# Patient Record
Sex: Female | Born: 1950 | Race: White | Hispanic: No | Smoking: Former smoker
Health system: Southern US, Community
[De-identification: ages and names within clinical notes are randomized; demographics above are authoritative.]

## PROBLEM LIST (undated history)

## (undated) DIAGNOSIS — J449 Chronic obstructive pulmonary disease, unspecified: Secondary | ICD-10-CM

## (undated) DIAGNOSIS — R0600 Dyspnea, unspecified: Secondary | ICD-10-CM

## (undated) DIAGNOSIS — J189 Pneumonia, unspecified organism: Secondary | ICD-10-CM

## (undated) DIAGNOSIS — E785 Hyperlipidemia, unspecified: Secondary | ICD-10-CM

## (undated) DIAGNOSIS — Z9889 Other specified postprocedural states: Secondary | ICD-10-CM

## (undated) DIAGNOSIS — M1990 Unspecified osteoarthritis, unspecified site: Secondary | ICD-10-CM

## (undated) DIAGNOSIS — R112 Nausea with vomiting, unspecified: Secondary | ICD-10-CM

## (undated) DIAGNOSIS — C342 Malignant neoplasm of middle lobe, bronchus or lung: Secondary | ICD-10-CM

## (undated) DIAGNOSIS — K449 Diaphragmatic hernia without obstruction or gangrene: Secondary | ICD-10-CM

## (undated) DIAGNOSIS — Z8489 Family history of other specified conditions: Secondary | ICD-10-CM

## (undated) DIAGNOSIS — K219 Gastro-esophageal reflux disease without esophagitis: Secondary | ICD-10-CM

## (undated) HISTORY — DX: Diaphragmatic hernia without obstruction or gangrene: K44.9

## (undated) HISTORY — DX: Hyperlipidemia, unspecified: E78.5

## (undated) MED FILL — Durvalumab Soln for IV Infusion 500 MG/10ML (50 MG/ML): INTRAVENOUS | Qty: 30 | Status: AC

---

## 2007-05-15 HISTORY — PX: ESOPHAGOGASTRODUODENOSCOPY: SHX1529

## 2013-10-30 DIAGNOSIS — W57XXXA Bitten or stung by nonvenomous insect and other nonvenomous arthropods, initial encounter: Secondary | ICD-10-CM | POA: Diagnosis not present

## 2013-10-30 DIAGNOSIS — N3000 Acute cystitis without hematuria: Secondary | ICD-10-CM | POA: Diagnosis not present

## 2013-10-30 DIAGNOSIS — R5383 Other fatigue: Secondary | ICD-10-CM | POA: Diagnosis not present

## 2013-10-30 DIAGNOSIS — R5381 Other malaise: Secondary | ICD-10-CM | POA: Diagnosis not present

## 2013-10-30 DIAGNOSIS — T148 Other injury of unspecified body region: Secondary | ICD-10-CM | POA: Diagnosis not present

## 2013-10-30 DIAGNOSIS — R634 Abnormal weight loss: Secondary | ICD-10-CM | POA: Diagnosis not present

## 2013-10-30 DIAGNOSIS — J069 Acute upper respiratory infection, unspecified: Secondary | ICD-10-CM | POA: Diagnosis not present

## 2013-11-12 DIAGNOSIS — R634 Abnormal weight loss: Secondary | ICD-10-CM | POA: Diagnosis not present

## 2014-01-22 DIAGNOSIS — Z23 Encounter for immunization: Secondary | ICD-10-CM | POA: Diagnosis not present

## 2014-02-19 DIAGNOSIS — J069 Acute upper respiratory infection, unspecified: Secondary | ICD-10-CM | POA: Diagnosis not present

## 2014-04-16 DIAGNOSIS — J069 Acute upper respiratory infection, unspecified: Secondary | ICD-10-CM | POA: Diagnosis not present

## 2014-06-17 DIAGNOSIS — J069 Acute upper respiratory infection, unspecified: Secondary | ICD-10-CM | POA: Diagnosis not present

## 2014-10-15 DIAGNOSIS — Z1231 Encounter for screening mammogram for malignant neoplasm of breast: Secondary | ICD-10-CM | POA: Diagnosis not present

## 2014-11-09 DIAGNOSIS — J069 Acute upper respiratory infection, unspecified: Secondary | ICD-10-CM | POA: Diagnosis not present

## 2015-04-20 DIAGNOSIS — R232 Flushing: Secondary | ICD-10-CM | POA: Diagnosis not present

## 2015-04-20 DIAGNOSIS — R5383 Other fatigue: Secondary | ICD-10-CM | POA: Diagnosis not present

## 2015-04-20 DIAGNOSIS — M25512 Pain in left shoulder: Secondary | ICD-10-CM | POA: Diagnosis not present

## 2015-04-20 DIAGNOSIS — R233 Spontaneous ecchymoses: Secondary | ICD-10-CM | POA: Diagnosis not present

## 2015-10-28 DIAGNOSIS — Z1231 Encounter for screening mammogram for malignant neoplasm of breast: Secondary | ICD-10-CM | POA: Diagnosis not present

## 2015-11-25 DIAGNOSIS — R928 Other abnormal and inconclusive findings on diagnostic imaging of breast: Secondary | ICD-10-CM | POA: Diagnosis not present

## 2015-11-25 DIAGNOSIS — R921 Mammographic calcification found on diagnostic imaging of breast: Secondary | ICD-10-CM | POA: Diagnosis not present

## 2016-01-10 DIAGNOSIS — J208 Acute bronchitis due to other specified organisms: Secondary | ICD-10-CM | POA: Diagnosis not present

## 2016-01-10 DIAGNOSIS — J209 Acute bronchitis, unspecified: Secondary | ICD-10-CM | POA: Diagnosis not present

## 2016-04-13 DIAGNOSIS — J060 Acute laryngopharyngitis: Secondary | ICD-10-CM | POA: Diagnosis not present

## 2016-04-13 DIAGNOSIS — J069 Acute upper respiratory infection, unspecified: Secondary | ICD-10-CM | POA: Diagnosis not present

## 2016-04-13 DIAGNOSIS — J180 Bronchopneumonia, unspecified organism: Secondary | ICD-10-CM | POA: Diagnosis not present

## 2016-06-01 DIAGNOSIS — R5383 Other fatigue: Secondary | ICD-10-CM | POA: Diagnosis not present

## 2016-06-01 DIAGNOSIS — E559 Vitamin D deficiency, unspecified: Secondary | ICD-10-CM | POA: Diagnosis not present

## 2016-06-01 DIAGNOSIS — R922 Inconclusive mammogram: Secondary | ICD-10-CM | POA: Diagnosis not present

## 2016-06-01 DIAGNOSIS — E782 Mixed hyperlipidemia: Secondary | ICD-10-CM | POA: Diagnosis not present

## 2016-06-01 DIAGNOSIS — K219 Gastro-esophageal reflux disease without esophagitis: Secondary | ICD-10-CM | POA: Diagnosis not present

## 2016-09-07 DIAGNOSIS — K219 Gastro-esophageal reflux disease without esophagitis: Secondary | ICD-10-CM | POA: Diagnosis not present

## 2016-09-07 DIAGNOSIS — E559 Vitamin D deficiency, unspecified: Secondary | ICD-10-CM | POA: Diagnosis not present

## 2016-09-07 DIAGNOSIS — R5383 Other fatigue: Secondary | ICD-10-CM | POA: Diagnosis not present

## 2016-09-07 DIAGNOSIS — I481 Persistent atrial fibrillation: Secondary | ICD-10-CM | POA: Diagnosis not present

## 2016-09-07 DIAGNOSIS — I5021 Acute systolic (congestive) heart failure: Secondary | ICD-10-CM | POA: Diagnosis not present

## 2016-09-07 DIAGNOSIS — R922 Inconclusive mammogram: Secondary | ICD-10-CM | POA: Diagnosis not present

## 2016-09-07 DIAGNOSIS — D519 Vitamin B12 deficiency anemia, unspecified: Secondary | ICD-10-CM | POA: Diagnosis not present

## 2016-09-07 DIAGNOSIS — E038 Other specified hypothyroidism: Secondary | ICD-10-CM | POA: Diagnosis not present

## 2016-09-07 DIAGNOSIS — E782 Mixed hyperlipidemia: Secondary | ICD-10-CM | POA: Diagnosis not present

## 2016-09-07 DIAGNOSIS — D529 Folate deficiency anemia, unspecified: Secondary | ICD-10-CM | POA: Diagnosis not present

## 2016-09-09 DIAGNOSIS — T63441A Toxic effect of venom of bees, accidental (unintentional), initial encounter: Secondary | ICD-10-CM | POA: Diagnosis not present

## 2016-09-11 ENCOUNTER — Other Ambulatory Visit: Payer: Self-pay

## 2016-09-12 DIAGNOSIS — H9201 Otalgia, right ear: Secondary | ICD-10-CM | POA: Diagnosis not present

## 2016-09-13 DIAGNOSIS — R921 Mammographic calcification found on diagnostic imaging of breast: Secondary | ICD-10-CM | POA: Diagnosis not present

## 2016-09-13 DIAGNOSIS — R928 Other abnormal and inconclusive findings on diagnostic imaging of breast: Secondary | ICD-10-CM | POA: Diagnosis not present

## 2016-09-28 DIAGNOSIS — J208 Acute bronchitis due to other specified organisms: Secondary | ICD-10-CM | POA: Diagnosis not present

## 2016-11-02 DIAGNOSIS — Z800 Family history of malignant neoplasm of digestive organs: Secondary | ICD-10-CM | POA: Diagnosis not present

## 2016-11-02 DIAGNOSIS — Z86010 Personal history of colonic polyps: Secondary | ICD-10-CM | POA: Diagnosis not present

## 2016-11-02 DIAGNOSIS — F17200 Nicotine dependence, unspecified, uncomplicated: Secondary | ICD-10-CM | POA: Diagnosis not present

## 2016-11-02 DIAGNOSIS — D509 Iron deficiency anemia, unspecified: Secondary | ICD-10-CM | POA: Diagnosis not present

## 2016-11-28 DIAGNOSIS — M545 Low back pain: Secondary | ICD-10-CM | POA: Diagnosis not present

## 2016-11-28 DIAGNOSIS — Z23 Encounter for immunization: Secondary | ICD-10-CM | POA: Diagnosis not present

## 2016-12-15 DIAGNOSIS — M5432 Sciatica, left side: Secondary | ICD-10-CM | POA: Diagnosis not present

## 2016-12-27 DIAGNOSIS — M810 Age-related osteoporosis without current pathological fracture: Secondary | ICD-10-CM | POA: Diagnosis not present

## 2016-12-27 DIAGNOSIS — M5432 Sciatica, left side: Secondary | ICD-10-CM | POA: Diagnosis not present

## 2016-12-27 DIAGNOSIS — M5136 Other intervertebral disc degeneration, lumbar region: Secondary | ICD-10-CM | POA: Diagnosis not present

## 2016-12-27 DIAGNOSIS — M25552 Pain in left hip: Secondary | ICD-10-CM | POA: Diagnosis not present

## 2017-01-18 DIAGNOSIS — D509 Iron deficiency anemia, unspecified: Secondary | ICD-10-CM | POA: Diagnosis not present

## 2017-01-18 DIAGNOSIS — E785 Hyperlipidemia, unspecified: Secondary | ICD-10-CM | POA: Diagnosis not present

## 2017-01-18 DIAGNOSIS — Z1211 Encounter for screening for malignant neoplasm of colon: Secondary | ICD-10-CM | POA: Diagnosis not present

## 2017-01-18 DIAGNOSIS — J449 Chronic obstructive pulmonary disease, unspecified: Secondary | ICD-10-CM | POA: Diagnosis not present

## 2017-01-18 DIAGNOSIS — Z87891 Personal history of nicotine dependence: Secondary | ICD-10-CM | POA: Diagnosis not present

## 2017-01-18 DIAGNOSIS — Z79899 Other long term (current) drug therapy: Secondary | ICD-10-CM | POA: Diagnosis not present

## 2017-01-18 DIAGNOSIS — K219 Gastro-esophageal reflux disease without esophagitis: Secondary | ICD-10-CM | POA: Diagnosis not present

## 2017-01-18 DIAGNOSIS — F419 Anxiety disorder, unspecified: Secondary | ICD-10-CM | POA: Diagnosis not present

## 2017-01-18 DIAGNOSIS — K5730 Diverticulosis of large intestine without perforation or abscess without bleeding: Secondary | ICD-10-CM | POA: Diagnosis not present

## 2017-01-18 DIAGNOSIS — D122 Benign neoplasm of ascending colon: Secondary | ICD-10-CM | POA: Diagnosis not present

## 2017-01-18 DIAGNOSIS — K449 Diaphragmatic hernia without obstruction or gangrene: Secondary | ICD-10-CM | POA: Diagnosis not present

## 2017-01-18 DIAGNOSIS — K648 Other hemorrhoids: Secondary | ICD-10-CM | POA: Diagnosis not present

## 2017-01-18 DIAGNOSIS — Z800 Family history of malignant neoplasm of digestive organs: Secondary | ICD-10-CM | POA: Diagnosis not present

## 2017-01-18 DIAGNOSIS — Z86010 Personal history of colonic polyps: Secondary | ICD-10-CM | POA: Diagnosis not present

## 2017-01-18 HISTORY — PX: COLONOSCOPY: SHX174

## 2017-01-18 LAB — HM COLONOSCOPY

## 2017-02-19 DIAGNOSIS — L03211 Cellulitis of face: Secondary | ICD-10-CM | POA: Diagnosis not present

## 2017-05-09 DIAGNOSIS — S61412A Laceration without foreign body of left hand, initial encounter: Secondary | ICD-10-CM | POA: Diagnosis not present

## 2017-05-09 DIAGNOSIS — J060 Acute laryngopharyngitis: Secondary | ICD-10-CM | POA: Diagnosis not present

## 2017-05-25 DIAGNOSIS — M25572 Pain in left ankle and joints of left foot: Secondary | ICD-10-CM | POA: Diagnosis not present

## 2017-05-25 DIAGNOSIS — S41112A Laceration without foreign body of left upper arm, initial encounter: Secondary | ICD-10-CM | POA: Diagnosis not present

## 2017-10-24 DIAGNOSIS — R921 Mammographic calcification found on diagnostic imaging of breast: Secondary | ICD-10-CM | POA: Diagnosis not present

## 2017-12-06 DIAGNOSIS — Z23 Encounter for immunization: Secondary | ICD-10-CM | POA: Diagnosis not present

## 2018-02-13 DIAGNOSIS — H4301 Vitreous prolapse, right eye: Secondary | ICD-10-CM | POA: Diagnosis not present

## 2018-02-18 DIAGNOSIS — Z23 Encounter for immunization: Secondary | ICD-10-CM | POA: Diagnosis not present

## 2018-02-18 DIAGNOSIS — E782 Mixed hyperlipidemia: Secondary | ICD-10-CM | POA: Diagnosis not present

## 2018-02-18 DIAGNOSIS — E559 Vitamin D deficiency, unspecified: Secondary | ICD-10-CM | POA: Diagnosis not present

## 2018-02-18 DIAGNOSIS — R634 Abnormal weight loss: Secondary | ICD-10-CM | POA: Diagnosis not present

## 2018-02-18 DIAGNOSIS — R5383 Other fatigue: Secondary | ICD-10-CM | POA: Diagnosis not present

## 2018-02-18 DIAGNOSIS — Z0001 Encounter for general adult medical examination with abnormal findings: Secondary | ICD-10-CM | POA: Diagnosis not present

## 2018-02-18 DIAGNOSIS — N3001 Acute cystitis with hematuria: Secondary | ICD-10-CM | POA: Diagnosis not present

## 2018-02-18 DIAGNOSIS — Z6821 Body mass index (BMI) 21.0-21.9, adult: Secondary | ICD-10-CM | POA: Diagnosis not present

## 2018-02-19 DIAGNOSIS — R000 Tachycardia, unspecified: Secondary | ICD-10-CM | POA: Diagnosis present

## 2018-02-19 DIAGNOSIS — J9601 Acute respiratory failure with hypoxia: Secondary | ICD-10-CM | POA: Diagnosis not present

## 2018-02-19 DIAGNOSIS — R509 Fever, unspecified: Secondary | ICD-10-CM | POA: Diagnosis not present

## 2018-02-19 DIAGNOSIS — R0902 Hypoxemia: Secondary | ICD-10-CM | POA: Diagnosis not present

## 2018-02-19 DIAGNOSIS — R590 Localized enlarged lymph nodes: Secondary | ICD-10-CM | POA: Diagnosis present

## 2018-02-19 DIAGNOSIS — J189 Pneumonia, unspecified organism: Secondary | ICD-10-CM | POA: Diagnosis present

## 2018-02-19 DIAGNOSIS — Z79899 Other long term (current) drug therapy: Secondary | ICD-10-CM | POA: Diagnosis not present

## 2018-02-19 DIAGNOSIS — R0602 Shortness of breath: Secondary | ICD-10-CM | POA: Diagnosis not present

## 2018-02-19 DIAGNOSIS — F17210 Nicotine dependence, cigarettes, uncomplicated: Secondary | ICD-10-CM | POA: Diagnosis present

## 2018-02-19 DIAGNOSIS — R0600 Dyspnea, unspecified: Secondary | ICD-10-CM | POA: Diagnosis not present

## 2018-02-19 DIAGNOSIS — D72829 Elevated white blood cell count, unspecified: Secondary | ICD-10-CM | POA: Diagnosis not present

## 2018-02-19 DIAGNOSIS — K219 Gastro-esophageal reflux disease without esophagitis: Secondary | ICD-10-CM | POA: Diagnosis present

## 2018-02-19 DIAGNOSIS — Z885 Allergy status to narcotic agent status: Secondary | ICD-10-CM | POA: Diagnosis not present

## 2018-02-19 DIAGNOSIS — E873 Alkalosis: Secondary | ICD-10-CM | POA: Diagnosis present

## 2018-02-19 DIAGNOSIS — E871 Hypo-osmolality and hyponatremia: Secondary | ICD-10-CM | POA: Diagnosis present

## 2018-02-19 DIAGNOSIS — R871 Abnormal level of hormones in specimens from female genital organs: Secondary | ICD-10-CM | POA: Diagnosis not present

## 2018-02-20 DIAGNOSIS — J189 Pneumonia, unspecified organism: Secondary | ICD-10-CM

## 2018-02-25 DIAGNOSIS — J168 Pneumonia due to other specified infectious organisms: Secondary | ICD-10-CM | POA: Diagnosis not present

## 2018-02-25 DIAGNOSIS — R799 Abnormal finding of blood chemistry, unspecified: Secondary | ICD-10-CM | POA: Diagnosis not present

## 2018-03-16 DIAGNOSIS — J111 Influenza due to unidentified influenza virus with other respiratory manifestations: Secondary | ICD-10-CM | POA: Diagnosis not present

## 2018-03-20 DIAGNOSIS — J09X2 Influenza due to identified novel influenza A virus with other respiratory manifestations: Secondary | ICD-10-CM | POA: Diagnosis not present

## 2018-03-27 DIAGNOSIS — N3001 Acute cystitis with hematuria: Secondary | ICD-10-CM | POA: Diagnosis not present

## 2018-06-26 DIAGNOSIS — E559 Vitamin D deficiency, unspecified: Secondary | ICD-10-CM | POA: Diagnosis not present

## 2018-06-26 DIAGNOSIS — R918 Other nonspecific abnormal finding of lung field: Secondary | ICD-10-CM | POA: Diagnosis not present

## 2018-06-26 DIAGNOSIS — K219 Gastro-esophageal reflux disease without esophagitis: Secondary | ICD-10-CM | POA: Diagnosis not present

## 2018-07-02 DIAGNOSIS — Z20828 Contact with and (suspected) exposure to other viral communicable diseases: Secondary | ICD-10-CM | POA: Diagnosis not present

## 2018-07-02 DIAGNOSIS — E782 Mixed hyperlipidemia: Secondary | ICD-10-CM | POA: Diagnosis not present

## 2018-07-02 DIAGNOSIS — R5383 Other fatigue: Secondary | ICD-10-CM | POA: Diagnosis not present

## 2018-07-02 DIAGNOSIS — E559 Vitamin D deficiency, unspecified: Secondary | ICD-10-CM | POA: Diagnosis not present

## 2018-09-10 DIAGNOSIS — R918 Other nonspecific abnormal finding of lung field: Secondary | ICD-10-CM | POA: Diagnosis not present

## 2018-09-10 DIAGNOSIS — R911 Solitary pulmonary nodule: Secondary | ICD-10-CM | POA: Diagnosis not present

## 2018-10-03 DIAGNOSIS — W19XXXA Unspecified fall, initial encounter: Secondary | ICD-10-CM | POA: Diagnosis not present

## 2018-10-03 DIAGNOSIS — R52 Pain, unspecified: Secondary | ICD-10-CM | POA: Diagnosis not present

## 2018-10-03 DIAGNOSIS — F17210 Nicotine dependence, cigarettes, uncomplicated: Secondary | ICD-10-CM | POA: Diagnosis not present

## 2018-10-03 DIAGNOSIS — S80811A Abrasion, right lower leg, initial encounter: Secondary | ICD-10-CM | POA: Diagnosis not present

## 2018-10-03 DIAGNOSIS — S80212A Abrasion, left knee, initial encounter: Secondary | ICD-10-CM | POA: Diagnosis not present

## 2018-10-03 DIAGNOSIS — S60512A Abrasion of left hand, initial encounter: Secondary | ICD-10-CM | POA: Diagnosis not present

## 2018-10-08 DIAGNOSIS — E782 Mixed hyperlipidemia: Secondary | ICD-10-CM | POA: Diagnosis not present

## 2018-10-08 DIAGNOSIS — S80811D Abrasion, right lower leg, subsequent encounter: Secondary | ICD-10-CM | POA: Diagnosis not present

## 2018-10-08 DIAGNOSIS — K219 Gastro-esophageal reflux disease without esophagitis: Secondary | ICD-10-CM | POA: Diagnosis not present

## 2018-10-08 DIAGNOSIS — E559 Vitamin D deficiency, unspecified: Secondary | ICD-10-CM | POA: Diagnosis not present

## 2018-10-11 DIAGNOSIS — E559 Vitamin D deficiency, unspecified: Secondary | ICD-10-CM | POA: Diagnosis not present

## 2018-10-11 DIAGNOSIS — R5383 Other fatigue: Secondary | ICD-10-CM | POA: Diagnosis not present

## 2018-10-11 DIAGNOSIS — E782 Mixed hyperlipidemia: Secondary | ICD-10-CM | POA: Diagnosis not present

## 2018-10-15 DIAGNOSIS — S80811D Abrasion, right lower leg, subsequent encounter: Secondary | ICD-10-CM | POA: Diagnosis not present

## 2018-11-13 DIAGNOSIS — H25013 Cortical age-related cataract, bilateral: Secondary | ICD-10-CM | POA: Diagnosis not present

## 2018-11-13 DIAGNOSIS — H5203 Hypermetropia, bilateral: Secondary | ICD-10-CM | POA: Diagnosis not present

## 2018-12-12 DIAGNOSIS — Z23 Encounter for immunization: Secondary | ICD-10-CM | POA: Diagnosis not present

## 2019-01-10 ENCOUNTER — Other Ambulatory Visit: Payer: Self-pay

## 2019-01-20 DIAGNOSIS — Z1231 Encounter for screening mammogram for malignant neoplasm of breast: Secondary | ICD-10-CM | POA: Diagnosis not present

## 2019-01-20 LAB — HM MAMMOGRAPHY

## 2019-05-14 ENCOUNTER — Other Ambulatory Visit: Payer: Self-pay

## 2019-05-14 MED ORDER — OMEPRAZOLE 20 MG PO CPDR: 20.0000 mg | DELAYED_RELEASE_CAPSULE | Freq: Once | ORAL | 0 refills | Status: DC

## 2019-05-30 ENCOUNTER — Other Ambulatory Visit: Payer: Self-pay | Admitting: Physician Assistant

## 2019-05-30 MED ORDER — OMEPRAZOLE 20 MG PO CPDR: 20.0000 mg | DELAYED_RELEASE_CAPSULE | Freq: Two times a day (BID) | ORAL | 3 refills | Status: DC

## 2019-06-02 ENCOUNTER — Other Ambulatory Visit: Payer: Self-pay | Admitting: Physician Assistant

## 2019-06-02 MED ORDER — OMEPRAZOLE 20 MG PO CPDR: 20.0000 mg | DELAYED_RELEASE_CAPSULE | Freq: Every day | ORAL | 3 refills | Status: DC

## 2019-09-26 DIAGNOSIS — S61412A Laceration without foreign body of left hand, initial encounter: Secondary | ICD-10-CM | POA: Diagnosis not present

## 2019-09-26 DIAGNOSIS — W19XXXA Unspecified fall, initial encounter: Secondary | ICD-10-CM | POA: Diagnosis not present

## 2019-09-26 DIAGNOSIS — M25552 Pain in left hip: Secondary | ICD-10-CM | POA: Diagnosis not present

## 2019-09-26 DIAGNOSIS — S61411A Laceration without foreign body of right hand, initial encounter: Secondary | ICD-10-CM | POA: Diagnosis not present

## 2019-11-04 DIAGNOSIS — Z20828 Contact with and (suspected) exposure to other viral communicable diseases: Secondary | ICD-10-CM | POA: Diagnosis not present

## 2019-12-02 ENCOUNTER — Ambulatory Visit (INDEPENDENT_AMBULATORY_CARE_PROVIDER_SITE_OTHER): Payer: Medicare Other

## 2019-12-02 DIAGNOSIS — Z23 Encounter for immunization: Secondary | ICD-10-CM

## 2019-12-05 ENCOUNTER — Telehealth: Payer: Self-pay

## 2019-12-05 NOTE — Telephone Encounter (Signed)
Pt is over due for an appointment. I spoke with the pt, she will call back to schedule an apt

## 2019-12-08 DIAGNOSIS — N814 Uterovaginal prolapse, unspecified: Secondary | ICD-10-CM | POA: Diagnosis not present

## 2019-12-23 DIAGNOSIS — Z466 Encounter for fitting and adjustment of urinary device: Secondary | ICD-10-CM | POA: Diagnosis not present

## 2019-12-23 DIAGNOSIS — N814 Uterovaginal prolapse, unspecified: Secondary | ICD-10-CM | POA: Diagnosis not present

## 2020-01-10 DIAGNOSIS — J324 Chronic pansinusitis: Secondary | ICD-10-CM | POA: Diagnosis not present

## 2020-01-12 ENCOUNTER — Encounter: Payer: Self-pay | Admitting: Family Medicine

## 2020-01-12 ENCOUNTER — Ambulatory Visit (INDEPENDENT_AMBULATORY_CARE_PROVIDER_SITE_OTHER): Payer: Medicare Other | Admitting: Family Medicine

## 2020-01-12 VITALS — BP 124/80 | HR 95 | Temp 97.5°F | Resp 18 | Wt 152.0 lb

## 2020-01-12 DIAGNOSIS — R059 Cough, unspecified: Secondary | ICD-10-CM

## 2020-01-12 HISTORY — DX: Cough, unspecified: R05.9

## 2020-01-12 MED ORDER — ALBUTEROL SULFATE HFA 108 (90 BASE) MCG/ACT IN AERS: 2.0000 | INHALATION_SPRAY | Freq: Four times a day (QID) | RESPIRATORY_TRACT | 0 refills | Status: DC | PRN

## 2020-01-12 NOTE — Patient Instructions (Signed)
Concern for cough/bronchospasm pts oxygen level low at urgent care and home Chest xray

## 2020-01-12 NOTE — Progress Notes (Signed)
Acute Office Visit  Subjective:    Patient ID: [name], female    DOB: [birth year], 69 y.o.   MRN: [id number]  Chief Complaint  Patient presents with  . Cough  . Nasal Congestion    HPI Patient is in today for continue cough-taking zithromax/promethazineDM Seen in UC-01/10/20-flu negative- Family members with COVID in August/Sept-no recent exposure Oxygen level low at home 90%-usually above 95% No tob use-quit 2019-smoked 50year-1/2 /day, no work site exposure. [place]    Coughing, runny nose (yellow mucus), nasal congestion, sore throat started Friday evening while at work. Went to UC and was given Zpack and Promethazine DM. Was also told to take sudafed but did not use. Grandchildren are also sick.   Great grandchildren with sore throat-congestion-fever   Social History   Socioeconomic History  . Marital status: Widowed    Spouse name: Not on file  . Number of children: Not on file  . Years of education: Not on file  . Highest education level: Not on file  Occupational History  . Not on file  Tobacco Use  . Smoking status: Former Smoker    Types: Cigarettes  . Smokeless tobacco: Never Used  Substance and Sexual Activity  . Alcohol use: Not Currently  . Drug use: Never  . Sexual activity: Not on file  Other Topics Concern  . Not on file  Social History Narrative  . Not on file   Social Determinants of Health   Financial Resource Strain:   . Difficulty of Paying Living Expenses: Not on file  Food Insecurity:   . Worried About Charity fundraiser in the Last Year: Not on file  . Ran Out of Food in the Last Year: Not on file  Transportation Needs:   . Lack of Transportation (Medical): Not on file  . Lack of Transportation (Non-Medical): Not on file  Physical Activity:   . Days of Exercise per Week: Not on file  . Minutes of Exercise per Session: Not on file  Stress:   . Feeling of Stress : Not on file  Social Connections:   . Frequency of  Communication with Friends and Family: Not on file  . Frequency of Social Gatherings with Friends and Family: Not on file  . Attends Religious Services: Not on file  . Active Member of Clubs or Organizations: Not on file  . Attends Archivist Meetings: Not on file  . Marital Status: Not on file  Intimate Partner Violence:   . Fear of Current or Ex-Partner: Not on file  . Emotionally Abused: Not on file  . Physically Abused: Not on file  . Sexually Abused: Not on file    Outpatient Medications Prior to Visit  Medication Sig Dispense Refill  . omeprazole (PRILOSEC) 20 MG capsule Take 1 capsule (20 mg total) by mouth daily. 90 capsule 3   No facility-administered medications prior to visit.    Allergies  Allergen Reactions  . Codeine Hives  . Surgical Lubricant Hives    Review of Systems  Constitutional: Negative for fever.  HENT: Positive for congestion, postnasal drip, sinus pain and sore throat.   Respiratory: Positive for cough. Negative for wheezing.        Low oxygen sat Used MDI in the past when sick  Cardiovascular: Negative for chest pain.  Gastrointestinal: Negative for abdominal pain.  Neurological: Negative for dizziness. Facial asymmetry: .cough.       Objective:    Physical Exam  Constitutional:      Appearance: Normal appearance.  HENT:     Head: Normocephalic and atraumatic.  Cardiovascular:     Rate and Rhythm: Normal rate and regular rhythm.     Pulses: Normal pulses.     Heart sounds: Normal heart sounds.  Pulmonary:     Breath sounds: Examination of the right-lower field reveals decreased breath sounds. Examination of the left-lower field reveals decreased breath sounds. Decreased breath sounds present. No wheezing or rhonchi.  Neurological:     Mental Status: She is alert and oriented to person, place, and time.  Psychiatric:        Mood and Affect: Mood normal.        Behavior: Behavior normal.     BP 124/80   Pulse 95   Temp  (!) 97.5 F (36.4 C)   Resp 18   Wt 152 lb (68.9 kg)   SpO2 96%  Wt Readings from Last 3 Encounters:  01/12/20 152 lb (68.9 kg)    Health Maintenance Due  Topic Date Due  . Hepatitis C Screening  Never done  . COVID-19 Vaccine (1) Never done  . TETANUS/TDAP  Never done  . MAMMOGRAM  Never done  . COLONOSCOPY  Never done  . DEXA SCAN  Never done  . PNA vac Low Risk Adult (1 of 2 - PCV13) Never done  No results found for: CHOLHDL No results found for: HGBA1C     Assessment & Plan:   1. Cough-likely bronchospasm-previous diagnosis COPD-exacerbation Albuterol-rx, CXR-d/w pt concern for pneumonia with low sat-no use of inhalers on a regular basis.  Complete zpack Follow-up:  cxr An After Visit Summary was printed and given to the patient.  Laguana Desautel Hannah Beat, MD [facility] [phone]

## 2020-01-13 ENCOUNTER — Telehealth: Payer: Self-pay

## 2020-01-13 ENCOUNTER — Other Ambulatory Visit: Payer: Self-pay | Admitting: Physician Assistant

## 2020-01-13 DIAGNOSIS — Z1231 Encounter for screening mammogram for malignant neoplasm of breast: Secondary | ICD-10-CM

## 2020-01-13 DIAGNOSIS — R059 Cough, unspecified: Secondary | ICD-10-CM | POA: Diagnosis not present

## 2020-01-13 NOTE — Telephone Encounter (Signed)
Pt called wanted to know if you can order her a mammo, she has a AWV appt 12/21.

## 2020-01-14 ENCOUNTER — Other Ambulatory Visit: Payer: Self-pay

## 2020-01-14 ENCOUNTER — Ambulatory Visit: Payer: Medicare Other | Admitting: Physician Assistant

## 2020-01-14 NOTE — Progress Notes (Signed)
Error

## 2020-01-19 DIAGNOSIS — R9389 Abnormal findings on diagnostic imaging of other specified body structures: Secondary | ICD-10-CM

## 2020-01-19 HISTORY — DX: Abnormal findings on diagnostic imaging of other specified body structures: R93.89

## 2020-01-22 DIAGNOSIS — N898 Other specified noninflammatory disorders of vagina: Secondary | ICD-10-CM | POA: Diagnosis not present

## 2020-01-22 DIAGNOSIS — Z466 Encounter for fitting and adjustment of urinary device: Secondary | ICD-10-CM | POA: Diagnosis not present

## 2020-01-23 DIAGNOSIS — I739 Peripheral vascular disease, unspecified: Secondary | ICD-10-CM | POA: Diagnosis not present

## 2020-01-23 DIAGNOSIS — I708 Atherosclerosis of other arteries: Secondary | ICD-10-CM | POA: Diagnosis not present

## 2020-01-23 DIAGNOSIS — I2510 Atherosclerotic heart disease of native coronary artery without angina pectoris: Secondary | ICD-10-CM | POA: Diagnosis not present

## 2020-01-23 DIAGNOSIS — R918 Other nonspecific abnormal finding of lung field: Secondary | ICD-10-CM | POA: Diagnosis not present

## 2020-01-23 DIAGNOSIS — R9389 Abnormal findings on diagnostic imaging of other specified body structures: Secondary | ICD-10-CM | POA: Diagnosis not present

## 2020-01-26 ENCOUNTER — Other Ambulatory Visit: Payer: Self-pay | Admitting: Physician Assistant

## 2020-01-26 DIAGNOSIS — J984 Other disorders of lung: Secondary | ICD-10-CM

## 2020-01-27 ENCOUNTER — Ambulatory Visit (INDEPENDENT_AMBULATORY_CARE_PROVIDER_SITE_OTHER): Payer: Medicare Other | Admitting: Physician Assistant

## 2020-01-27 ENCOUNTER — Other Ambulatory Visit: Payer: Self-pay

## 2020-01-27 ENCOUNTER — Encounter: Payer: Self-pay | Admitting: Physician Assistant

## 2020-01-27 VITALS — BP 120/82 | HR 85 | Temp 97.2°F | Ht 67.5 in | Wt 154.4 lb

## 2020-01-27 DIAGNOSIS — Z1231 Encounter for screening mammogram for malignant neoplasm of breast: Secondary | ICD-10-CM | POA: Diagnosis not present

## 2020-01-27 DIAGNOSIS — I700 Atherosclerosis of aorta: Secondary | ICD-10-CM

## 2020-01-27 DIAGNOSIS — I771 Stricture of artery: Secondary | ICD-10-CM | POA: Diagnosis not present

## 2020-01-27 DIAGNOSIS — R918 Other nonspecific abnormal finding of lung field: Secondary | ICD-10-CM | POA: Diagnosis not present

## 2020-01-27 MED ORDER — ROSUVASTATIN CALCIUM 5 MG PO TABS: 5.0000 mg | ORAL_TABLET | Freq: Every day | ORAL | 2 refills | Status: DC

## 2020-01-27 NOTE — Progress Notes (Signed)
Subjective:  Patient ID: [name], female    DOB: 12/10/1950  Age: 69 y.o. MRN: [id number]  Chief Complaint  Patient presents with  . 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 - pt has been smoker most of her life - had CT over a year ago and did not come back for repeat CT as advised I have ordered a PET scan and have also ordered for her to see CVTS for further evaluation and treatment Pt voices no problems or concerns and actually states she is feeling well today  CT also showed emphysema - pt has albuterol inhaler but states her cough is gone and does not need or use this inhaler regularly - denies shortness of breath today  Aortic atherosclerosis was noted on CT as well - this was discussed with patient - she is agreeable to restart statin treatment - this had been advised in past but pt had stopped medication  It is noted that 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 albuterol (VENTOLIN HFA) 108 (90 Base) MCG/ACT inhaler Inhale 2 puffs into the lungs every 6 (six) hours as needed for wheezing or shortness of breath. 8 g 0  . omeprazole (PRILOSEC) 20 MG capsule Take 1 capsule (20 mg total) by mouth daily. 90 capsule 3   No current facility-administered medications on file prior to visit.   History reviewed. No pertinent past medical history. History reviewed. No pertinent surgical history.  History reviewed. No pertinent family history. Social History   Socioeconomic History  . Marital status: Widowed    Spouse name: Not on file  . Number of children: Not on file  . Years of education: Not on file  . Highest education level: Not on file  Occupational History  . Not on file  Tobacco Use  . Smoking  status: Former Smoker    Types: Cigarettes  . Smokeless tobacco: Never Used  Substance and Sexual Activity  . Alcohol use: Not Currently  . Drug use: Never  . Sexual activity: Not on file  Other Topics Concern  . Not on file  Social History Narrative  . Not on file   Social Determinants of Health   Financial Resource Strain:   . Difficulty of Paying Living Expenses: Not on file  Food Insecurity:   . Worried About Charity fundraiser in the Last Year: Not on file  . Ran Out of Food in the Last Year: Not on file  Transportation Needs:   . Lack of Transportation (Medical): Not on file  . Lack of Transportation (Non-Medical): Not on file  Physical Activity:   . Days of Exercise per Week: Not on file  . Minutes of Exercise per Session: Not on file  Stress:   . Feeling of Stress : Not on file  Social Connections:   . Frequency of Communication with Friends and Family: Not on file  . Frequency of Social Gatherings with Friends and Family: Not on file  . Attends Religious Services: Not on file  . Active Member of Clubs or Organizations: Not on file  . Attends Archivist Meetings: Not on file  . Marital Status: Not on file    Review of Systems CONSTITUTIONAL: Negative for  chills, fatigue, fever, unintentional weight gain and unintentional weight loss.  E/N/T: Negative for ear pain, nasal congestion and sore throat.  CARDIOVASCULAR: Negative for chest pain, dizziness, palpitations and pedal edema.  RESPIRATORY: Negative for recent cough and dyspnea.   MSK: Negative for arthralgias and myalgias.  INTEGUMENTARY: Negative for rash.   PSYCHIATRIC: Negative for sleep disturbance and to question depression screen.  Negative for depression, negative for anhedonia.       Objective:  BP 120/82 (BP Location: Left Arm, Patient Position: Sitting, Cuff Size: Normal)   Pulse 85   Temp (!) 97.2 F (36.2 C) (Temporal)   Ht 5' 7.5" (1.715 m)   Wt 154 lb 6.4 oz (70 kg)   SpO2  96%   BMI 23.83 kg/m   BP/Weight 01/27/2020 40/97/3532  Systolic BP 992 426  Diastolic BP 82 80  Wt. (Lbs) 154.4 152  BMI 23.83 -    Physical Exam PHYSICAL EXAM:   VS: BP 120/82 (BP Location: Left Arm, Patient Position: Sitting, Cuff Size: Normal)   Pulse 85   Temp (!) 97.2 F (36.2 C) (Temporal)   Ht 5' 7.5" (1.715 m)   Wt 154 lb 6.4 oz (70 kg)   SpO2 96%   BMI 23.83 kg/m   GEN: Well nourished, well developed, in no acute distress  Cardiac: RRR; no murmurs, rubs, or gallops,no edema - no significant varicosities Respiratory:  normal respiratory rate and pattern with no distress - normal breath sounds with no rales, rhonchi, wheezes or rubs GI: normal bowel sounds, no masses or tenderness MS: no deformity or atrophy  Skin: warm and dry, no rash  Neuro:  Alert and Oriented x 3, Strength and sensation are intact - CN II-Xii grossly intact Psych: euthymic mood, appropriate affect and demeanor  Diabetic Foot Exam - Simple   No data filed       No results found for: WBC, HGB, HCT, PLT, GLUCOSE, CHOL, TRIG, HDL, LDLDIRECT, LDLCALC, ALT, AST, NA, K, CL, CREATININE, BUN, CO2, TSH, PSA, INR, GLUF, HGBA1C, MICROALBUR    Assessment & Plan:   1. Right lower lobe lung mass PET scan and referral to CVTS 2. Aortic atherosclerosis (HCC) - rosuvastatin (CRESTOR) 5 MG tablet; Take 1 tablet (5 mg total) by mouth daily.  Dispense: 30 tablet; Refill: 2  3. Visit for screening mammogram - MM Digital Screening  4. Stenosis of left subclavian artery (HCC)  referral to CVTS  Meds ordered this encounter  Medications  . rosuvastatin (CRESTOR) 5 MG tablet    Sig: Take 1 tablet (5 mg total) by mouth daily.    Dispense:  30 tablet    Refill:  2    Order Specific Question:   Supervising Provider    AnswerShelton Silvas    Orders Placed This Encounter  Procedures  . MM Digital Screening        Follow-up: Return in about 4 weeks (around 02/24/2020).  An After Visit  Summary was printed and given to the patient.  Yetta Flock [facility] [phone]

## 2020-01-28 ENCOUNTER — Encounter: Payer: Self-pay | Admitting: Physician Assistant

## 2020-01-28 DIAGNOSIS — I771 Stricture of artery: Secondary | ICD-10-CM

## 2020-01-28 DIAGNOSIS — Z1231 Encounter for screening mammogram for malignant neoplasm of breast: Secondary | ICD-10-CM

## 2020-01-28 DIAGNOSIS — R918 Other nonspecific abnormal finding of lung field: Secondary | ICD-10-CM | POA: Insufficient documentation

## 2020-01-28 DIAGNOSIS — I700 Atherosclerosis of aorta: Secondary | ICD-10-CM

## 2020-01-28 HISTORY — DX: Other nonspecific abnormal finding of lung field: R91.8

## 2020-01-28 HISTORY — DX: Encounter for screening mammogram for malignant neoplasm of breast: Z12.31

## 2020-01-28 HISTORY — DX: Atherosclerosis of aorta: I70.0

## 2020-01-28 HISTORY — DX: Stricture of artery: I77.1

## 2020-01-29 ENCOUNTER — Other Ambulatory Visit: Payer: Self-pay | Admitting: Family Medicine

## 2020-01-29 ENCOUNTER — Ambulatory Visit: Payer: Medicare Other | Admitting: Physician Assistant

## 2020-02-05 ENCOUNTER — Encounter: Payer: Self-pay | Admitting: Cardiothoracic Surgery

## 2020-02-05 ENCOUNTER — Other Ambulatory Visit: Payer: Self-pay | Admitting: *Deleted

## 2020-02-05 ENCOUNTER — Institutional Professional Consult (permissible substitution) (INDEPENDENT_AMBULATORY_CARE_PROVIDER_SITE_OTHER): Payer: Medicare Other | Admitting: Cardiothoracic Surgery

## 2020-02-05 ENCOUNTER — Other Ambulatory Visit: Payer: Self-pay | Admitting: Cardiothoracic Surgery

## 2020-02-05 ENCOUNTER — Other Ambulatory Visit: Payer: Self-pay

## 2020-02-05 VITALS — BP 161/70 | HR 82 | Temp 98.1°F | Resp 20 | Ht 67.5 in | Wt 151.8 lb

## 2020-02-05 DIAGNOSIS — R918 Other nonspecific abnormal finding of lung field: Secondary | ICD-10-CM | POA: Diagnosis not present

## 2020-02-05 NOTE — Progress Notes (Signed)
The proposed treatment discussed in cancer conference 02/05/20 is for discussion purpose only and not a binding recommendation.  The patient was not physically examined nor present for their treatment options.  Therefore, final treatment plans cannot be decided.

## 2020-02-05 NOTE — Progress Notes (Signed)
[address]       [place] [address]             [phone]                    Katriel Bhakta [place] Medical Record #[id number] Date of Birth: [birth year]  Referring: Marge Duncans, PA-C Primary Care: Marge Duncans, PA-C Primary Cardiologist: No primary care provider on file.  Chief Complaint:    Chief Complaint  Patient presents with  . Lung Mass    Surgical consult, Chest CT 01/23/20 and 09/10/18    History of Present Illness:    [name] 69 y.o. female is seen in the office  today for for evaluation of abnormal CT scan of the chest done on 01/28/2020.  Patient notes a couple weeks of cough and sputum production, this led to a chest x-ray and ultimately ultimately a CT scan of the chest.  Chest x-ray showed ns right lung field, per reports this would been noted on CT scan of the chest in July, has been recommended the patient have a follow-up CT scan in 6 months.  Patient is a long-term smoker quitting approximately 3 years ago-we have no records of pulmonary function studies done in the past  Patient has not been vaccinated for Covid-she was encouraged to do this  Patient has a history of cardiac murmur and irregular heartbeat-but no other details of this history available  Current Activity/ Functional Status:  Patient is independent with mobility/ambulation, transfers, ADL's, IADL's.   Zubrod Score: At the time of surgery this patient's most appropriate activity status/level should be described as: []     0    Normal activity, no symptoms []     1    Restricted in physical strenuous activity but ambulatory, able to do out light work [x]     2    Ambulatory and capable of self care, unable to do work activities, up and about               >50 % of waking hours                              []     3    Only limited self care, in bed greater than 50% of waking hours []     4    Completely disabled, no self care, confined to bed or chair []     5     Moribund   No past medical history on file.  No past surgical history on file.  No family history on file.   Social History   Tobacco Use  Smoking Status Former Smoker  . Types: Cigarettes  Smokeless Tobacco Never Used    Social History   Substance and Sexual Activity  Alcohol Use Not Currently  works  [employer]- part time   Allergies  Allergen Reactions  . Codeine Hives  . Surgical Lubricant Hives    Current Outpatient Medications  Medication Sig Dispense Refill  . omeprazole (PRILOSEC) 20 MG capsule Take 1 capsule (20 mg total) by mouth daily. 90 capsule 3  . rosuvastatin (CRESTOR) 5 MG tablet Take 1 tablet (5 mg total) by mouth daily. 30 tablet 2   No current facility-administered medications for this visit.    Pertinent items are noted in HPI.   Review of Systems:     Cardiac Review of Systems: [  Y] = yes  or   [ N ] = no   Chest Pain [ n   ]  Resting SOB [ n  ] Exertional SOB  [ y ]  Orthopnea [  ]   Pedal Edema [ n  ]    Palpitations Blue.Reese  ] Syncope  Florencio.Farrier  ]   Presyncope [ n  ]   General Review of Systems: [Y] = yes [  ]=no Constitional: recent weight change [  ];  Wt loss over the last 3 months [   ] anorexia [  ]; fatigue [  ]; nausea [  ]; night sweats [  ]; fever [  ]; or chills [  ];           Eye : blurred vision [  ]; diplopia [   ]; vision changes [  ];  Amaurosis fugax[  ]; Resp: cough [  ];  wheezing[  ];  hemoptysis[  ]; shortness of breath[  ]; paroxysmal nocturnal dyspnea[  ]; dyspnea on exertion[  ]; or orthopnea[  ];  GI:  gallstones[  ], vomiting[  ];  dysphagia[  ]; melena[  ];  hematochezia [  ]; heartburn[  ];   Hx of  Colonoscopy[  ]; GU: kidney stones [  ]; hematuria[  ];   dysuria [  ];  nocturia[  ];  history of     obstruction [  ]; urinary frequency [  ]             Skin: rash, swelling[  ];, hair loss[  ];  peripheral edema[  ];  or itching[  ]; Musculosketetal: myalgias[  ];  joint swelling[  ];  joint erythema[  ];   joint pain[  ];  back pain[  ];  Heme/Lymph: bruising[  ];  bleeding[  ];  anemia[  ];  Neuro: TIA[  ];  headaches[  ];  stroke[  ];  vertigo[  ];  seizures[  ];   paresthesias[  ];  difficulty walking[  ];  Psych:depression[  ]; anxiety[  ];  Endocrine: diabetes[  ];  thyroid dysfunction[  ];  Immunizations: Flu up to date [  ]; Pneumococcal up to date [  ]; COVID-40 vacination completed  [ n  ]  Other:     PHYSICAL EXAMINATION: BP (!) 161/70 (BP Location: Right Arm, Cuff Size: Normal)   Pulse 82   Temp 98.1 F (36.7 C) (Skin)   Resp 20   Ht 5' 7.5" (1.715 m)   Wt 151 lb 12.8 oz (68.9 kg)   SpO2 97% Comment: RA  BMI 23.42 kg/m  General appearance: alert and cooperative Head: Normocephalic, without obvious abnormality, atraumatic Lymph nodes: Cervical, supraclavicular, and axillary nodes normal. Resp: clear to auscultation bilaterally Cardio: regular rate and rhythm and systolic murmur: early systolic 2/6, crescendo at 2nd left intercostal space Extremities: extremities normal, atraumatic, no cyanosis or edema Neurologic: Grossly normal  Diagnostic Studies & Laboratory data:     Recent Radiology Findings:    outsdie ct - cystic area with solid portion  2.2 x1.2 right middle lobe    #1 right middle lobe mass with solid portion 2.2 x 1.2 cm with associated cystic structure. #2 evidence of coronary and aortic arch atherosclerotic disease CT-cardiac murmur on exam  I discussed with the patient importance of Covid vaccination-we will arrange a PET scan as soon as possible to further work-up her nodule and see her back in the  office soon after the PET scan is completed.     I  spent 60 minutes with  the patient face to face and greater then 50% of the time was spent in counseling and coordination of care.    Grace Isaac MD      [address] [place] [address] Office [phone]     02/22/2020 1:00 PM

## 2020-02-17 ENCOUNTER — Ambulatory Visit (HOSPITAL_COMMUNITY)
Admission: RE | Admit: 2020-02-17 | Discharge: 2020-02-17 | Disposition: A | Discharge status: Home / Self Care | Payer: Medicare Other | Source: Ambulatory Visit | Attending: Cardiothoracic Surgery | Admitting: Cardiothoracic Surgery

## 2020-02-17 ENCOUNTER — Ambulatory Visit (HOSPITAL_COMMUNITY): Admission: RE | Admit: 2020-02-17 | Payer: Medicare Other | Source: Ambulatory Visit

## 2020-02-17 ENCOUNTER — Other Ambulatory Visit: Payer: Self-pay

## 2020-02-17 DIAGNOSIS — I2510 Atherosclerotic heart disease of native coronary artery without angina pectoris: Secondary | ICD-10-CM | POA: Diagnosis not present

## 2020-02-17 DIAGNOSIS — R918 Other nonspecific abnormal finding of lung field: Secondary | ICD-10-CM | POA: Insufficient documentation

## 2020-02-17 DIAGNOSIS — J439 Emphysema, unspecified: Secondary | ICD-10-CM | POA: Insufficient documentation

## 2020-02-17 DIAGNOSIS — K760 Fatty (change of) liver, not elsewhere classified: Secondary | ICD-10-CM | POA: Diagnosis not present

## 2020-02-17 DIAGNOSIS — I700 Atherosclerosis of aorta: Secondary | ICD-10-CM | POA: Insufficient documentation

## 2020-02-17 DIAGNOSIS — J8489 Other specified interstitial pulmonary diseases: Secondary | ICD-10-CM | POA: Diagnosis not present

## 2020-02-17 DIAGNOSIS — J432 Centrilobular emphysema: Secondary | ICD-10-CM | POA: Diagnosis not present

## 2020-02-17 LAB — GLUCOSE, CAPILLARY: Glucose-Capillary: 93 mg/dL (ref 70–99)

## 2020-02-17 MED ORDER — FLUDEOXYGLUCOSE F - 18 (FDG) INJECTION
8.1000 | Freq: Once | INTRAVENOUS | Status: AC | PRN
  Administered 2020-02-17: 14:00:00 8.1 via INTRAVENOUS

## 2020-02-23 ENCOUNTER — Other Ambulatory Visit (HOSPITAL_COMMUNITY)
Admission: RE | Admit: 2020-02-23 | Discharge: 2020-02-23 | Disposition: A | Discharge status: Home / Self Care | Payer: Medicare Other | Source: Ambulatory Visit | Attending: Cardiothoracic Surgery | Admitting: Cardiothoracic Surgery

## 2020-02-23 DIAGNOSIS — Z01812 Encounter for preprocedural laboratory examination: Secondary | ICD-10-CM | POA: Insufficient documentation

## 2020-02-23 DIAGNOSIS — Z20822 Contact with and (suspected) exposure to covid-19: Secondary | ICD-10-CM | POA: Diagnosis not present

## 2020-02-24 LAB — SARS CORONAVIRUS 2 (TAT 6-24 HRS): SARS Coronavirus 2: NEGATIVE

## 2020-02-25 ENCOUNTER — Other Ambulatory Visit: Payer: Self-pay

## 2020-02-25 ENCOUNTER — Ambulatory Visit (HOSPITAL_COMMUNITY)
Admission: RE | Admit: 2020-02-25 | Discharge: 2020-02-25 | Disposition: A | Discharge status: Home / Self Care | Payer: Medicare Other | Source: Ambulatory Visit | Attending: Cardiothoracic Surgery | Admitting: Cardiothoracic Surgery

## 2020-02-25 DIAGNOSIS — R918 Other nonspecific abnormal finding of lung field: Secondary | ICD-10-CM | POA: Diagnosis not present

## 2020-02-25 LAB — PULMONARY FUNCTION TEST
DL/VA % pred: 83 %
DL/VA: 3.44 ml/min/mmHg/L
DLCO unc % pred: 81 %
DLCO unc: 16.31 ml/min/mmHg
FEF 25-75 Post: 0.98 L/sec
FEF 25-75 Pre: 0.93 L/sec
FEF2575-%Change-Post: 4 %
FEF2575-%Pred-Post: 51 %
FEF2575-%Pred-Pre: 48 %
FEV1-%Change-Post: 0 %
FEV1-%Pred-Post: 77 %
FEV1-%Pred-Pre: 77 %
FEV1-Post: 1.8 L
FEV1-Pre: 1.8 L
FEV1FVC-%Change-Post: -4 %
FEV1FVC-%Pred-Pre: 85 %
FEV6-%Change-Post: 4 %
FEV6-%Pred-Post: 94 %
FEV6-%Pred-Pre: 90 %
FEV6-Post: 2.77 L
FEV6-Pre: 2.66 L
FEV6FVC-%Change-Post: 0 %
FEV6FVC-%Pred-Post: 100 %
FEV6FVC-%Pred-Pre: 100 %
FVC-%Change-Post: 4 %
FVC-%Pred-Post: 93 %
FVC-%Pred-Pre: 89 %
FVC-Post: 2.89 L
FVC-Pre: 2.76 L
Post FEV1/FVC ratio: 62 %
Post FEV6/FVC ratio: 96 %
Pre FEV1/FVC ratio: 65 %
Pre FEV6/FVC Ratio: 96 %
RV % pred: 165 %
RV: 3.73 L
TLC % pred: 121 %
TLC: 6.32 L

## 2020-02-25 MED ORDER — ALBUTEROL SULFATE (2.5 MG/3ML) 0.083% IN NEBU
2.5000 mg | INHALATION_SOLUTION | Freq: Once | RESPIRATORY_TRACT | Status: AC
  Administered 2020-02-25: 2.5 mg via RESPIRATORY_TRACT

## 2020-02-26 ENCOUNTER — Encounter: Payer: Self-pay | Admitting: Cardiothoracic Surgery

## 2020-02-26 ENCOUNTER — Other Ambulatory Visit: Payer: Self-pay

## 2020-02-26 ENCOUNTER — Ambulatory Visit (INDEPENDENT_AMBULATORY_CARE_PROVIDER_SITE_OTHER): Payer: Medicare Other | Admitting: Cardiothoracic Surgery

## 2020-02-26 VITALS — BP 155/88 | HR 70 | Resp 20 | Ht 67.5 in | Wt 153.0 lb

## 2020-02-26 DIAGNOSIS — N8110 Cystocele, unspecified: Secondary | ICD-10-CM | POA: Diagnosis not present

## 2020-02-26 DIAGNOSIS — R918 Other nonspecific abnormal finding of lung field: Secondary | ICD-10-CM | POA: Diagnosis not present

## 2020-02-26 DIAGNOSIS — Z466 Encounter for fitting and adjustment of urinary device: Secondary | ICD-10-CM | POA: Diagnosis not present

## 2020-02-26 NOTE — Progress Notes (Signed)
[address]       [place] [address]             [phone]                    [name] [place] Medical Record #[id number] Date of Birth: 1951/01/09  Referring: Marge Duncans, PA-C Primary Care: Marge Duncans, PA-C Primary Cardiologist: No primary care provider on file.  Chief Complaint:    Chief Complaint  Patient presents with  . Lung Mass    F/u to discuss PET Scan and PFT results    History of Present Illness:    [name] 70 y.o. female is seen in the office  today for follow evaluation   of abnormal CT scan of the chest done on 01/28/2020.  She returns today with PFT's and PET scan    Patient notes in December 2021 she had couple weeks of cough and sputum production, this led to a chest x-ray and ultimately ultimately a CT scan of the chest.  Chest x-ray showed mass  right lung field, per reports this would been noted on CT scan of the chest in July, has been recommended the patient have a follow-up CT scan in 6 months.  Patient is a long-term smoker quitting approximately 3 years ago-  Patient has not been vaccinated for Covid-she was encouraged to do this, since she was last seen still has not proceed with covid immunization   Patient notes previous cardica workup for murmur - she has no history of MI or stent placement       Current Activity/ Functional Status:  Patient is independent with mobility/ambulation, transfers, ADL's, IADL's.   Zubrod Score: At the time of surgery this patient's most appropriate activity status/level should be described as: [x]     0    Normal activity, no symptoms [x]     1    Restricted in physical strenuous activity but ambulatory, able to do out light work []     2    Ambulatory and capable of self care, unable to do work activities, up and about               >50 % of waking hours                              []     3    Only limited self care, in bed greater than 50% of waking hours []     4     Completely disabled, no self care, confined to bed or chair []     5    Moribund   Past Medical History:  Diagnosis Date  . Hiatal hernia   . Hyperlipemia     No past surgical history on file.  Family History  Problem Relation Age of Onset  . Heart disease Mother   . Cancer Father      Social History   Tobacco Use  Smoking Status Former Smoker  . Years: 50.00  . Types: Cigarettes  . Quit date: 03/22/2017  . Years since quitting: 2.9  Smokeless Tobacco Never Used    Social History   Substance and Sexual Activity  Alcohol Use Not Currently     Allergies  Allergen Reactions  . Codeine Hives  . Surgical Lubricant Hives    Current Outpatient Medications  Medication Sig Dispense Refill  . omeprazole (PRILOSEC) 20 MG capsule Take  1 capsule (20 mg total) by mouth daily. 90 capsule 3  . rosuvastatin (CRESTOR) 5 MG tablet Take 1 tablet (5 mg total) by mouth daily. 30 tablet 2   No current facility-administered medications for this visit.    Pertinent items are noted in HPI.   Review of Systems:     Cardiac Review of Systems: [Y] = yes  or   [ N ] = no   Chest Pain Florencio.Farrier    ]  Resting SOB [  n ] Exertional SOB  [ y ]  Orthopnea [ n ]   Pedal Edema [ n  ]    Palpitations [ n ] Syncope  [ n ]   Presyncope [   ]   General Review of Systems: [Y] = yes [  ]=no Constitional: recent weight change [  ];  Wt loss over the last 3 months [   ] anorexia [  ]; fatigue [  ]; nausea [  ]; night sweats [  ]; fever [  ]; or chills [  ];           Eye : blurred vision [  ]; diplopia [   ]; vision changes [  ];  Amaurosis fugax[  ]; Resp: cough [  ];  wheezing[  ];  hemoptysis[  ]; shortness of breath[  ]; paroxysmal nocturnal dyspnea[  ]; dyspnea on exertion[  ]; or orthopnea[  ];  GI:  gallstones[  ], vomiting[  ];  dysphagia[  ]; melena[  ];  hematochezia [  ]; heartburn[  ];   Hx of  Colonoscopy[  ]; GU: kidney stones [  ]; hematuria[  ];   dysuria [  ];  nocturia[  ];  history of      obstruction [  ]; urinary frequency [  ]             Skin: rash, swelling[  ];, hair loss[  ];  peripheral edema[  ];  or itching[  ]; Musculosketetal: myalgias[  ];  joint swelling[  ];  joint erythema[  ];  joint pain[  ];  back pain[  ];  Heme/Lymph: bruising[  ];  bleeding[  ];  anemia[  ];  Neuro: TIA[  ];  headaches[  ];  stroke[  ];  vertigo[  ];  seizures[  ];   paresthesias[  ];  difficulty walking[  ];  Psych:depression[  ]; anxiety[  ];  Endocrine: diabetes[  ];  thyroid dysfunction[  ];  Immunizations: Flu up to date Blue.Reese ]; Pneumococcal up to date [ y ]; COVID-2 vacination completed  [ n  ]  Other:     PHYSICAL EXAMINATION: BP (!) 155/88   Pulse 70   Resp 20   Ht 5' 7.5" (1.715 m)   Wt 153 lb (69.4 kg)   SpO2 96% Comment: RA  BMI 23.61 kg/m  General appearance: alert and cooperative Neck: no adenopathy, no carotid bruit, no JVD, supple, symmetrical, trachea midline and thyroid not enlarged, symmetric, no tenderness/mass/nodules Resp: clear to auscultation bilaterally Cardio: regular rate and rhythm, S1, S2 normal, no murmur, click, rub or gallop GI: soft, non-tender; bowel sounds normal; no masses,  no organomegaly Extremities: extremities normal, atraumatic, no cyanosis or edema Neurologic: Grossly normal  Diagnostic Studies & Laboratory data:     Recent Radiology Findings:   NM PET Image Initial (PI) Skull Base To Thigh  Result Date: 02/17/2020 CLINICAL DATA:  Initial treatment strategy for right  middle lobe nodule/mass. EXAM: NUCLEAR MEDICINE PET SKULL BASE TO THIGH TECHNIQUE: 8.1 mCi F-18 FDG was injected intravenously. Full-ring PET imaging was performed from the skull base to thigh after the radiotracer. CT data was obtained and used for attenuation correction and anatomic localization. Fasting blood glucose: 93 mg/dl COMPARISON:  Chest CT 01/23/2020 FINDINGS: Mediastinal blood pool activity: SUV max 2.2 Liver activity: SUV max NA NECK: No significant abnormal  hypermetabolic activity in this region. Incidental CT findings: Bilateral common carotid atherosclerotic calcification. CHEST: The solid component of the right middle lobe mass has a maximum SUV of 6.2, compatible with malignancy. This solid component measures about 2.2 by 1.2 cm although there are sub solid components as well as interstitial thickening surrounding cystic airspaces associated with this lesion, in total measuring about 4.4 by 3.2 cm on image 41 of series 8. No pathologically enlarged or hypermetabolic adenopathy is identified in the thorax. Incidental CT findings: Centrilobular emphysema. Coronary, aortic arch, and branch vessel atherosclerotic vascular disease. 0.6 by 0.4 cm right middle lobe medial pulmonary nodule on image 47 of series 8, essentially stable from 03/28/2007, considered benign. 0.3 by 0.2 cm left upper lobe nodule along the major fissure on image 18 of series 8 is technically nonspecific due to small size. ABDOMEN/PELVIS: No significant abnormal hypermetabolic activity in this region. Incidental CT findings: Diffuse hepatic steatosis. Aortoiliac atherosclerotic vascular disease. SKELETON: No significant abnormal hypermetabolic activity in this region. Incidental CT findings: Chronic lucency in the right iliac bone on image 153 of series 4 common no change from 05/17/2007, considered benign and incidental. IMPRESSION: 1. The solid component of right middle lobe mass has a maximum SUV of 6.2, compatible with malignancy. No hypermetabolic adenopathy or other compelling findings of metastatic spread. 2. Aortic Atherosclerosis (ICD10-I70.0) and Emphysema (ICD10-J43.9). Coronary atherosclerosis. 3. Diffuse hepatic steatosis. Electronically Signed   By: Van Clines M.D.   On: 02/17/2020 15:31     I have independently reviewed the above radiology studies  and reviewed the findings with the patient.   Patient has CT scans from February 20, 2018 September 10, 2018 and January 23, 2020  but demonstrate a partial solid cystic mass in the right middle lobe that has been increasing in size over the past 2 years also has noted extensive coronary calcification and 50 to 60% left subclavian stenosis These findings are listed in the radiology system under [facility] Hart same birth date.   PFT's   FEV1 1.8 77% DLCO 16.3 81%   Recent Lab Findings: No results found for: WBC, HGB, HCT, PLT, GLUCOSE, CHOL, TRIG, HDL, LDLDIRECT, LDLCALC, ALT, AST, NA, K, CL, CREATININE, BUN, CO2, TSH, INR, GLUF, HGBA1C    Assessment / Plan:   #1 part cystic solid enlarging mass right middle lobe-now on PET scan hypermetabolic-solid component measures 2.2 x 1.2 cm surrounding cystic airspace total measures 4.4 x 3.2-there is no evidence of hypermetabolic adenopathy or other findings consistent with metastatic spread-if malignant clinical stage I disease #2 long-term smoking history with significant peripheral vascular disease with left subclavian stenosis 50 to 60% and extensive calcification of her coronary system on standard CT of the chest-patient has no previous history of coronary interventions.  She does give a history of previous cardiac evaluation and was told she had a heart murmur-we have no more details on this-because of the degree of calcification of her coronary arteries, with peripheral vascular disease, long-term smoking history we will ask for cardiac evaluation prior to consideration of surgical lung resection  #  3 no history of COVID vaccination-on both visits to the office I have strongly encouraged the patient to proceed with COVID vaccination as soon as possible, today she says she will proceed with this immediately.  I discussed with her and her daughter and reviewed the films that most likely these findings suggest progressive enlargement of primary lung cancer-now with a solid component that is significantly hypermetabolic.  I recommended that we proceed with robotic assisted  surgical resection.  The patient is interested in pursuing this prior to setting a definite surgical date we we will have her be evaluated by cardiology-for preop clearance  We will plan to proceed with surgical resection as soon as she has been evaluated by cardiology     Grace Isaac MD      [address] ,[place] [address] Office [phone]     02/26/2020 4:48 PM

## 2020-02-27 ENCOUNTER — Ambulatory Visit: Payer: Medicare Other | Admitting: Physician Assistant

## 2020-02-27 DIAGNOSIS — E785 Hyperlipidemia, unspecified: Secondary | ICD-10-CM | POA: Insufficient documentation

## 2020-02-27 DIAGNOSIS — K449 Diaphragmatic hernia without obstruction or gangrene: Secondary | ICD-10-CM | POA: Insufficient documentation

## 2020-02-27 DIAGNOSIS — Z23 Encounter for immunization: Secondary | ICD-10-CM | POA: Diagnosis not present

## 2020-03-01 ENCOUNTER — Ambulatory Visit (INDEPENDENT_AMBULATORY_CARE_PROVIDER_SITE_OTHER): Payer: Medicare Other | Admitting: Cardiology

## 2020-03-01 ENCOUNTER — Other Ambulatory Visit: Payer: Self-pay

## 2020-03-01 ENCOUNTER — Encounter: Payer: Self-pay | Admitting: Cardiology

## 2020-03-01 VITALS — BP 144/82 | HR 87 | Ht 67.0 in | Wt 153.8 lb

## 2020-03-01 DIAGNOSIS — R011 Cardiac murmur, unspecified: Secondary | ICD-10-CM | POA: Insufficient documentation

## 2020-03-01 DIAGNOSIS — Z01810 Encounter for preprocedural cardiovascular examination: Secondary | ICD-10-CM

## 2020-03-01 DIAGNOSIS — E782 Mixed hyperlipidemia: Secondary | ICD-10-CM

## 2020-03-01 DIAGNOSIS — I2510 Atherosclerotic heart disease of native coronary artery without angina pectoris: Secondary | ICD-10-CM | POA: Insufficient documentation

## 2020-03-01 DIAGNOSIS — R918 Other nonspecific abnormal finding of lung field: Secondary | ICD-10-CM

## 2020-03-01 DIAGNOSIS — I700 Atherosclerosis of aorta: Secondary | ICD-10-CM

## 2020-03-01 HISTORY — DX: Atherosclerotic heart disease of native coronary artery without angina pectoris: I25.10

## 2020-03-01 HISTORY — DX: Encounter for preprocedural cardiovascular examination: Z01.810

## 2020-03-01 HISTORY — DX: Cardiac murmur, unspecified: R01.1

## 2020-03-01 NOTE — Progress Notes (Signed)
Cardiology Office Note:    Date:  03/01/2020   ID:  [name], DOB 05-18-1950, MRN [id number]  PCP:  Marge Duncans, PA-C  Cardiologist:  Jenean Lindau, MD   Referring MD: Grace Isaac, MD    ASSESSMENT:    1. Aortic atherosclerosis ([place])   2. Mixed hyperlipidemia   3. Right lower lobe lung mass   4. Pre-operative cardiovascular examination   5. Coronary artery calcification seen on CT scan   6. Cardiac murmur    PLAN:    In order of problems listed above:  1. Coronary artery disease: Diagnosed by multivessel calcification evident on CT scan.  Secondary prevention stressed with the patient.  Importance of compliance with diet medication stressed and she vocalized understanding.  I discussed with her about preoperative risk stratification.  In view of multivessel calcification and multiple risk factors we will do a Lexiscan sestamibi.  If this is negative then she is not at high risk for coronary events during the aforementioned surgery.  Meticulous hemodynamic monitoring will further reduce the risk of coronary events. 2. Mixed dyslipidemia: Diet was emphasized.  Patient is on statin therapy. 3. Cardiac murmur: Echocardiogram will be done to assess murmur on auscultation. 4. Ex-smoker: Promises never to go back to smoking. 5. Patient will be seen in follow-up appointment in 3 months or earlier if the patient has any concerns    Medication Adjustments/Labs and Tests Ordered: Current medicines are reviewed at length with the patient today.  Concerns regarding medicines are outlined above.  No orders of the defined types were placed in this encounter.  No orders of the defined types were placed in this encounter.    History of Present Illness:    [name] is a 70 y.o. female who is being seen today for the evaluation of preoperative cardiovascular evaluation diagnosis of coronary artery disease by CT scan of the chest at the request of Grace Isaac, MD.   Patient is a pleasant 70 year old female.  She has past medical history of dyslipidemia.  She is diagnosed with the lung mass and is planning to undergo surgery for this.  She is an active lady and works full-time.  She denies any chest pain orthopnea or PND.  Because of the above findings and the fact that she is going for significant surgery her surgeon requested evaluation for preop stratification from a cardiac standpoint.  At the time of my evaluation, the patient is alert awake oriented and in no distress.  Patient's daughter accompanies her for this visit.  Past Medical History:  Diagnosis Date  . Abnormal chest x-ray 01/19/2020  . Aortic atherosclerosis ([place]) 01/28/2020  . Cough 01/12/2020  . Hiatal hernia   . Hyperlipemia   . 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 Visit for screening mammogram 01/28/2020    History reviewed. No pertinent surgical history.  Current Medications: Current Meds  Medication Sig  . Ascorbic Acid (VITAMIN C PO) Take 3 tablets by mouth daily.  . Coenzyme Q10 (CO Q-10 PO) Take 2 tablets by mouth daily.  . Multiple Vitamins-Minerals (MULTI FOR HER 50+ PO) Take 2 tablets by mouth daily.  Marland Kitchen omeprazole (PRILOSEC) 20 MG capsule Take 1 capsule (20 mg total) by mouth daily.  . rosuvastatin (CRESTOR) 5 MG tablet Take 1 tablet (5 mg total) by mouth daily.  Marland Kitchen VITAMIN D PO Take 2 tablets by mouth daily.     Allergies:  Codeine and Surgical lubricant   Social History   Socioeconomic History  . Marital status: Widowed    Spouse name: Not on file  . Number of children: Not on file  . Years of education: Not on file  . Highest education level: Not on file  Occupational History  . Not on file  Tobacco Use  . Smoking status: Former Smoker    Years: 50.00    Types: Cigarettes    Quit date: 03/22/2017    Years since quitting: 2.9  . Smokeless tobacco: Never Used  Vaping Use  . Vaping Use: Never used   Substance and Sexual Activity  . Alcohol use: Not Currently  . Drug use: Never  . Sexual activity: Not Currently  Other Topics Concern  . Not on file  Social History Narrative  . Not on file   Social Determinants of Health   Financial Resource Strain: Not on file  Food Insecurity: Not on file  Transportation Needs: Not on file  Physical Activity: Not on file  Stress: Not on file  Social Connections: Not on file     Family History: The patient's family history includes Cancer in her father; Diabetes in her father; Heart attack in her brother and mother; Heart disease in her brother and mother; High Cholesterol in her brother and brother; Non-Hodgkin's lymphoma in her brother.  ROS:   Please see the history of present illness.    All other systems reviewed and are negative.  EKGs/Labs/Other Studies Reviewed:    The following studies were reviewed today: EKG reveals sinus rhythm and nonspecific ST-T changes   Recent Labs: No results found for requested labs within last 8760 hours.  Recent Lipid Panel No results found for: CHOL, TRIG, HDL, CHOLHDL, VLDL, LDLCALC, LDLDIRECT  Physical Exam:    VS:  BP (!) 144/82 (BP Location: Right Arm)   Pulse 87   Ht 5\' 7"  (1.702 m)   Wt 153 lb 12.8 oz (69.8 kg)   SpO2 97%   BMI 24.09 kg/m     Wt Readings from Last 3 Encounters:  03/01/20 153 lb 12.8 oz (69.8 kg)  02/26/20 153 lb (69.4 kg)  02/05/20 151 lb 12.8 oz (68.9 kg)     GEN: Patient is in no acute distress HEENT: Normal NECK: No JVD; No carotid bruits LYMPHATICS: No lymphadenopathy CARDIAC: S1 S2 regular, 2/6 systolic murmur at the apex. RESPIRATORY:  Clear to auscultation without rales, wheezing or rhonchi  ABDOMEN: Soft, non-tender, non-distended MUSCULOSKELETAL:  No edema; No deformity  SKIN: Warm and dry NEUROLOGIC:  Alert and oriented x 3 PSYCHIATRIC:  Normal affect    Signed, Jenean Lindau, MD  03/01/2020 11:11 AM    [place]

## 2020-03-01 NOTE — Patient Instructions (Signed)
Medication Instructions:  No medication changes. *If you need a refill on your cardiac medications before your next appointment, please call your pharmacy*   Lab Work: None ordered If you have labs (blood work) drawn today and your tests are completely normal, you will receive your results only by: Marland Kitchen MyChart Message (if you have MyChart) OR . A paper copy in the mail If you have any lab test that is abnormal or we need to change your treatment, we will call you to review the results.   Testing/Procedures: Your physician has requested that you have an echocardiogram. Echocardiography is a painless test that uses sound waves to create images of your heart. It provides your doctor with information about the size and shape of your heart and how well your heart's chambers and valves are working. This procedure takes approximately one hour. There are no restrictions for this procedure.  Your physician has requested that you have a lexiscan myoview. For further information please visit [email or web address]. Please follow instruction sheet, as given.  The test will take approximately 3 to 4 hours to complete; you may bring reading material.  If someone comes with you to your appointment, they will need to remain in the main lobby due to limited space in the testing area.  How to prepare for your Myocardial Perfusion Test: . Do not eat or drink 3 hours prior to your test, except you may have water. . Do not consume products containing caffeine (regular or decaffeinated) 12 hours prior to your test. (ex: coffee, chocolate, sodas, tea). . Do bring a list of your current medications with you.  If not listed below, you may take your medications as normal. . Do wear comfortable clothes (no dresses or overalls) and walking shoes, tennis shoes preferred (No heels or open toe shoes are allowed). . Do NOT wear cologne, perfume, aftershave, or lotions (deodorant is allowed). . If these instructions are not  followed, your test will have to be rescheduled.    Follow-Up: At [facility], you and your health needs are our priority.  As part of our continuing mission to provide you with exceptional heart care, we have created designated Provider Care Teams.  These Care Teams include your primary Cardiologist (physician) and Advanced Practice Providers (APPs -  Physician Assistants and Nurse Practitioners) who all work together to provide you with the care you need, when you need it.  We recommend signing up for the patient portal called "MyChart".  Sign up information is provided on this After Visit Summary.  MyChart is used to connect with patients for Virtual Visits (Telemedicine).  Patients are able to view lab/test results, encounter notes, upcoming appointments, etc.  Non-urgent messages can be sent to your provider as well.   To learn more about what you can do with MyChart, go to [email or web address].    Your next appointment:   1 month(s)  The format for your next appointment:   In Person  Provider:   Jyl Heinz, MD   Other Instructions  Cardiac Nuclear Scan  A cardiac nuclear scan is a test that is done to check the flow of blood to your heart. It is done when you are resting and when you are exercising. The test looks for problems such as:  Not enough blood reaching a portion of the heart.  The heart muscle not working as it should. You may need this test if:  You have heart disease.  You have had lab results that are not normal.  You have had heart surgery or a balloon procedure to open up blocked arteries (angioplasty).  You have chest pain.  You have shortness of breath. In this test, a special dye (tracer) is put into your bloodstream. The tracer will travel to your heart. A camera will then take pictures of your heart to see how the tracer moves through your heart. This test is usually done at a hospital and takes 2-4 hours. Tell a doctor about:  Any  allergies you have.  All medicines you are taking, including vitamins, herbs, eye drops, creams, and over-the-counter medicines.  Any problems you or family members have had with anesthetic medicines.  Any blood disorders you have.  Any surgeries you have had.  Any medical conditions you have.  Whether you are pregnant or may be pregnant. What are the risks? Generally, this is a safe test. However, problems may occur, such as:  Serious chest pain and heart attack. This is only a risk if the stress portion of the test is done.  Rapid heartbeat.  A feeling of warmth in your chest. This feeling usually does not last long.  Allergic reaction to the tracer. What happens before the test?  Ask your doctor about changing or stopping your normal medicines. This is important.  Follow instructions from your doctor about what you cannot eat or drink.  Remove your jewelry on the day of the test. What happens during the test? 1. An IV tube will be inserted into one of your veins. 2. Your doctor will give you a small amount of tracer through the IV tube. 3. You will wait for 20-40 minutes while the tracer moves through your bloodstream. 4. Your heart will be monitored with an electrocardiogram (ECG). 5. You will lie down on an exam table. 6. Pictures of your heart will be taken for about 15-20 minutes. 7. You may also have a stress test. For this test, one of these things may be done: ? You will be asked to exercise on a treadmill or a stationary bike. ? You will be given medicines that will make your heart work harder. This is done if you are unable to exercise. 8. When blood flow to your heart has peaked, a tracer will again be given through the IV tube. 9. After 20-40 minutes, you will get back on the exam table. More pictures will be taken of your heart. 10. Depending on the tracer that is used, more pictures may need to be taken 3-4 hours later. 11. Your IV tube will be removed when  the test is over. The test may vary among doctors and hospitals. What happens after the test? 1. Ask your doctor: ? Whether you can return to your normal schedule, including diet, activities, and medicines. ? Whether you should drink more fluids. This will help to remove the tracer from your body. Drink enough fluid to keep your pee (urine) pale yellow. 2. Ask your doctor, or the department that is doing the test: ? When will my results be ready? ? How will I get my results? Summary  A cardiac nuclear scan is a test that is done to check the flow of blood to your heart.  Tell your doctor whether you are pregnant or may be pregnant.  Before the test, ask your doctor about changing or stopping your normal medicines. This is important.  Ask your doctor whether you can return to your normal activities. You may be asked to drink more fluids. This information is  not intended to replace advice given to you by your health care provider. Make sure you discuss any questions you have with your health care provider. Document Revised: 05/29/2018 Document Reviewed: 07/23/2017 Elsevier Patient Education  [place].  Echocardiogram An echocardiogram is a procedure that uses painless sound waves (ultrasound) to produce an image of the heart. Images from an echocardiogram can provide important information about:  Signs of coronary artery disease (CAD).  Aneurysm detection. An aneurysm is a weak or damaged part of an artery wall that bulges out from the normal force of blood pumping through the body.  Heart size and shape. Changes in the size or shape of the heart can be associated with certain conditions, including heart failure, aneurysm, and CAD.  Heart muscle function.  Heart valve function.  Signs of a past heart attack.  Fluid buildup around the heart.  Thickening of the heart muscle.  A tumor or infectious growth around the heart valves. Tell a health care provider  about:  Any allergies you have.  All medicines you are taking, including vitamins, herbs, eye drops, creams, and over-the-counter medicines.  Any blood disorders you have.  Any surgeries you have had.  Any medical conditions you have.  Whether you are pregnant or may be pregnant. What are the risks? Generally, this is a safe procedure. However, problems may occur, including:  Allergic reaction to dye (contrast) that may be used during the procedure. What happens before the procedure? No specific preparation is needed. You may eat and drink normally. What happens during the procedure?    An IV tube may be inserted into one of your veins.  You may receive contrast through this tube. A contrast is an injection that improves the quality of the pictures from your heart.  A gel will be applied to your chest.  A wand-like tool (transducer) will be moved over your chest. The gel will help to transmit the sound waves from the transducer.  The sound waves will harmlessly bounce off of your heart to allow the heart images to be captured in real-time motion. The images will be recorded on a computer. The procedure may vary among health care providers and hospitals. What happens after the procedure?  You may return to your normal, everyday life, including diet, activities, and medicines, unless your health care provider tells you not to do that. Summary  An echocardiogram is a procedure that uses painless sound waves (ultrasound) to produce an image of the heart.  Images from an echocardiogram can provide important information about the size and shape of your heart, heart muscle function, heart valve function, and fluid buildup around your heart.  You do not need to do anything to prepare before this procedure. You may eat and drink normally.  After the echocardiogram is completed, you may return to your normal, everyday life, unless your health care provider tells you not to do  that. This information is not intended to replace advice given to you by your health care provider. Make sure you discuss any questions you have with your health care provider. Document Revised: 05/30/2018 Document Reviewed: 03/11/2016 Elsevier Patient Education  [place].

## 2020-03-02 ENCOUNTER — Telehealth: Payer: Self-pay | Admitting: Cardiology

## 2020-03-03 ENCOUNTER — Ambulatory Visit (HOSPITAL_BASED_OUTPATIENT_CLINIC_OR_DEPARTMENT_OTHER)
Admission: RE | Admit: 2020-03-03 | Discharge: 2020-03-03 | Disposition: A | Discharge status: Home / Self Care | Payer: Medicare Other | Source: Ambulatory Visit | Attending: Cardiology | Admitting: Cardiology

## 2020-03-03 ENCOUNTER — Other Ambulatory Visit: Payer: Self-pay

## 2020-03-03 DIAGNOSIS — R011 Cardiac murmur, unspecified: Secondary | ICD-10-CM

## 2020-03-03 DIAGNOSIS — Z01810 Encounter for preprocedural cardiovascular examination: Secondary | ICD-10-CM

## 2020-03-03 DIAGNOSIS — I700 Atherosclerosis of aorta: Secondary | ICD-10-CM

## 2020-03-03 DIAGNOSIS — I2510 Atherosclerotic heart disease of native coronary artery without angina pectoris: Secondary | ICD-10-CM | POA: Diagnosis not present

## 2020-03-03 LAB — ECHOCARDIOGRAM COMPLETE
Area-P 1/2: 2.66 cm2
S' Lateral: 2.79 cm

## 2020-03-04 ENCOUNTER — Telehealth: Payer: Self-pay | Admitting: Cardiology

## 2020-03-04 ENCOUNTER — Ambulatory Visit (INDEPENDENT_AMBULATORY_CARE_PROVIDER_SITE_OTHER): Payer: Medicare Other

## 2020-03-04 DIAGNOSIS — Z01810 Encounter for preprocedural cardiovascular examination: Secondary | ICD-10-CM | POA: Diagnosis not present

## 2020-03-04 DIAGNOSIS — I700 Atherosclerosis of aorta: Secondary | ICD-10-CM | POA: Diagnosis not present

## 2020-03-04 DIAGNOSIS — I2510 Atherosclerotic heart disease of native coronary artery without angina pectoris: Secondary | ICD-10-CM

## 2020-03-04 MED ORDER — TECHNETIUM TC 99M TETROFOSMIN IV KIT
10.7000 | PACK | Freq: Once | INTRAVENOUS | Status: AC | PRN
  Administered 2020-03-04: 10.7 via INTRAVENOUS

## 2020-03-04 MED ORDER — TECHNETIUM TC 99M TETROFOSMIN IV KIT
31.9000 | PACK | Freq: Once | INTRAVENOUS | Status: AC | PRN
  Administered 2020-03-04: 31.9 via INTRAVENOUS

## 2020-03-04 MED ORDER — REGADENOSON 0.4 MG/5ML IV SOLN
0.4000 mg | Freq: Once | INTRAVENOUS | Status: AC
  Administered 2020-03-04: 0.4 mg via INTRAVENOUS

## 2020-03-04 NOTE — Telephone Encounter (Signed)
Patient returning Shonda's call for results, connected call to shonda

## 2020-03-05 ENCOUNTER — Other Ambulatory Visit: Payer: Self-pay | Admitting: *Deleted

## 2020-03-05 ENCOUNTER — Encounter: Payer: Self-pay | Admitting: *Deleted

## 2020-03-05 DIAGNOSIS — R918 Other nonspecific abnormal finding of lung field: Secondary | ICD-10-CM

## 2020-03-05 LAB — MYOCARDIAL PERFUSION IMAGING
LV dias vol: 49 mL (ref 46–106)
LV sys vol: 12 mL
Peak HR: 110 {beats}/min
Rest HR: 66 {beats}/min
SDS: 1
SRS: 0
SSS: 1
TID: 0.9

## 2020-03-10 ENCOUNTER — Encounter: Payer: Self-pay | Admitting: Physician Assistant

## 2020-03-10 ENCOUNTER — Telehealth (INDEPENDENT_AMBULATORY_CARE_PROVIDER_SITE_OTHER): Payer: Medicare Other | Admitting: Physician Assistant

## 2020-03-10 VITALS — HR 102 | Ht 62.0 in | Wt 153.0 lb

## 2020-03-10 DIAGNOSIS — R5381 Other malaise: Secondary | ICD-10-CM | POA: Diagnosis not present

## 2020-03-10 DIAGNOSIS — U071 COVID-19: Secondary | ICD-10-CM | POA: Diagnosis not present

## 2020-03-10 HISTORY — DX: COVID-19: U07.1

## 2020-03-10 LAB — POC COVID19 BINAXNOW: SARS Coronavirus 2 Ag: POSITIVE — AB

## 2020-03-10 NOTE — Progress Notes (Signed)
Virtual Visit via Telephone Note   This visit type was conducted due to national recommendations for restrictions regarding the COVID-19 Pandemic (e.g. social distancing) in an effort to limit this patient's exposure and mitigate transmission in our community.  Due to her co-morbid illnesses, this patient is at least at moderate risk for complications without adequate follow up.  This format is felt to be most appropriate for this patient at this time.  The patient did not have access to video technology/had technical difficulties with video requiring transitioning to audio format only (telephone).  All issues noted in this document were discussed and addressed.  No physical exam could be performed with this format.  Patient verbally consented to a telehealth visit.   Date:  03/10/2020   ID:  [name], DOB 1950/12/07, MRN [id number]  Patient Location: Home Provider Location: Office  PCP:  Marge Duncans, PA-C   Chief Complaint:  Malaise/covid exposure  History of Present Illness:    [name] is a 69 y.o. female with malaise and headache that started yesterday - states she has had a slight cough as well - she has had positive COVID exposure to someone in her home -- of note she is scheduled for lung surgery on 03/29/20  The patient does have symptoms concerning for COVID-19 infection (fever, chills, cough, or new shortness of breath).    Past Medical History:  Diagnosis Date  . Abnormal chest x-ray 01/19/2020  . Aortic atherosclerosis ([place]) 01/28/2020  . Cough 01/12/2020  . Hiatal hernia   . Hyperlipemia   . 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 Visit for screening mammogram 01/28/2020   History reviewed. No pertinent surgical history.   Current Meds  Medication Sig  . Ascorbic Acid (VITAMIN C PO) Take 3 tablets by mouth daily.  . Coenzyme Q10 (CO Q-10 PO) Take 2 tablets by mouth daily.  . Multiple Vitamins-Minerals (MULTI  FOR HER 50+ PO) Take 2 tablets by mouth daily.  Marland Kitchen omeprazole (PRILOSEC) 20 MG capsule Take 1 capsule (20 mg total) by mouth daily.  . rosuvastatin (CRESTOR) 5 MG tablet Take 1 tablet (5 mg total) by mouth daily.  Marland Kitchen VITAMIN D PO Take 2 tablets by mouth daily.     Allergies:   Codeine and Surgical lubricant   Social History   Tobacco Use  . Smoking status: Former Smoker    Years: 50.00    Types: Cigarettes    Quit date: 03/22/2017    Years since quitting: 2.9  . Smokeless tobacco: Never Used  Vaping Use  . Vaping Use: Never used  Substance Use Topics  . Alcohol use: Not Currently  . Drug use: Never     Family Hx: The patient's family history includes Cancer in her father; Diabetes in her father; Heart attack in her brother and mother; Heart disease in her brother and mother; High Cholesterol in her brother and brother; Non-Hodgkin's lymphoma in her brother.  ROS:   Please see the history of present illness.    All other systems reviewed and are negative.  Labs/Other Tests and Data Reviewed:    Recent Labs: No results found for requested labs within last 8760 hours.   Recent Lipid Panel No results found for: CHOL, TRIG, HDL, CHOLHDL, LDLCALC, LDLDIRECT  Wt Readings from Last 3 Encounters:  03/10/20 153 lb (69.4 kg)  03/04/20 153 lb (69.4 kg)  03/01/20 153 lb 12.8 oz (69.8 kg)  Objective:    Vital Signs:  Pulse (!) 102   Ht 5\' 2"  (1.575 m)   Wt 153 lb (69.4 kg)   SpO2 96%   BMI 27.98 kg/m    VITAL SIGNS:  reviewed  ASSESSMENT & PLAN:    1. COVID -19 in high risk patient - recommend IV antibody infusion - rest, fluids and follow up if any symptoms change or worsen 2. History of lung mass and scheduled for surgery 03/29/20 COVID-19 Education: The signs and symptoms of COVID-19 were discussed with the patient and how to seek care for testing (follow up with PCP or arrange E-visit). The importance of social distancing was discussed today.  Time:   Today, I have  spent 10 minutes with the patient with telehealth technology discussing the above problems.   Philipp Ovens CMA obtained history and vitals  Medication Adjustments/Labs and Tests Ordered: Current medicines are reviewed at length with the patient today.  Concerns regarding medicines are outlined above.   Tests Ordered: No orders of the defined types were placed in this encounter.   Medication Changes: No orders of the defined types were placed in this encounter.   Follow Up:  In Person prn  Signed, Webb Silversmith, PA-C  03/10/2020 9:11 AM    [place]

## 2020-03-11 ENCOUNTER — Telehealth: Payer: Self-pay | Admitting: *Deleted

## 2020-03-11 ENCOUNTER — Telehealth: Payer: Self-pay | Admitting: Family

## 2020-03-11 ENCOUNTER — Other Ambulatory Visit: Payer: Self-pay | Admitting: Family

## 2020-03-11 DIAGNOSIS — E782 Mixed hyperlipidemia: Secondary | ICD-10-CM

## 2020-03-11 DIAGNOSIS — Z6827 Body mass index (BMI) 27.0-27.9, adult: Secondary | ICD-10-CM

## 2020-03-11 DIAGNOSIS — U071 COVID-19: Secondary | ICD-10-CM

## 2020-03-11 DIAGNOSIS — I700 Atherosclerosis of aorta: Secondary | ICD-10-CM

## 2020-03-11 NOTE — Progress Notes (Signed)
I connected by phone with [name] on 03/11/2020 at 2:49 PM to discuss the potential use of a new treatment for mild to moderate COVID-19 viral infection in non-hospitalized patients.  This patient is a 70 y.o. female that meets the FDA criteria for Emergency Use Authorization of COVID monoclonal antibody sotrovimab.  Has a (+) direct SARS-CoV-2 viral test result  Has mild or moderate COVID-19   Is NOT hospitalized due to COVID-19  Is within 10 days of symptom onset  Has at least one of the high risk factor(s) for progression to severe COVID-19 and/or hospitalization as defined in EUA.  Specific high risk criteria : Older age (>/= 72 yo), BMI > 25 and Cardiovascular disease or hypertension   I have spoken and communicated the following to the patient or parent/caregiver regarding COVID monoclonal antibody treatment:  1. FDA has authorized the emergency use for the treatment of mild to moderate COVID-19 in adults and pediatric patients with positive results of direct SARS-CoV-2 viral testing who are 33 years of age and older weighing at least 40 kg, and who are at high risk for progressing to severe COVID-19 and/or hospitalization.  2. The significant known and potential risks and benefits of COVID monoclonal antibody, and the extent to which such potential risks and benefits are unknown.  3. Information on available alternative treatments and the risks and benefits of those alternatives, including clinical trials.  4. Patients treated with COVID monoclonal antibody should continue to self-isolate and use infection control measures (e.g., wear mask, isolate, social distance, avoid sharing personal items, clean and disinfect "high touch" surfaces, and frequent handwashing) according to CDC guidelines.   5. The patient or parent/caregiver has the option to accept or refuse COVID monoclonal antibody treatment.  After reviewing this information with the patient, the patient has agreed to  receive one of the available covid 19 monoclonal antibodies and will be provided an appropriate fact sheet prior to infusion.   Mauricio Po, FNP 03/11/2020 2:49 PM

## 2020-03-11 NOTE — Telephone Encounter (Signed)
Called to discuss with patient about COVID-19 symptoms and the use of one of the available treatments for those with mild to moderate Covid symptoms and at a high risk of hospitalization.  Pt appears to qualify for outpatient treatment due to co-morbid conditions and/or a member of an at-risk group in accordance with the FDA Emergency Use Authorization.   Has the first [place] vaccine on 02/27/20.  Diarrhea, back pain, cough, chest tightness, body aches.Her provider Marge Duncans told the patient she should qualify for the infusion. Patient lives in [place].   Symptom onset: 03/09/20 Today is day 3 Vaccinated: One dose of [employer]? no Immunocompromised? no Qualifiers: Upcoming procedure to remove a possible cancerous growth on one of her lungs  Very interested in a treatment.  Tarry Kos

## 2020-03-11 NOTE — Telephone Encounter (Signed)
Called to discuss with patient about COVID-19 symptoms and the use of one of the available treatments for those with mild to moderate Covid symptoms and at a high risk of hospitalization.  Pt appears to qualify for outpatient treatment due to co-morbid conditions and/or a member of an at-risk group in accordance with the FDA Emergency Use Authorization.    Symptom onset: 03/09/20  Vaccinated: One dose of Pfizer 02/26/20 Booster? Not eligible yet Immunocompromised? no Qualifiers: Aortic atherosclerosis, age, BMI 70, hyperlipidemia   Currently having diarrhea, nausea, and headache. I spoke with [name] regarding the risks, benefits, and potential financial costs associated with treatment with Sotrovimab and she wishes to continue with treatment.   Hello [name],   We contacted you because you were recently diagnosed with COVID-19 and may benefit from a new treatment for mild to moderate disease. This treatment helps reduce the chance of being hospitalized. For some patients with medical conditions that may increase the chances of an infection, the treatment also decreases the risk for serious symptoms related to COVID-19.   The Food and Drug Administration (FDA) approved emergency use of a new drug to treat patients with mild to moderate symptoms who have risk factors that could cause severe symptoms related to COVID-19. This new treatment is a monoclonal antibody. It works by attaching like a magnet to the SARS-CoV2 virus (the virus that causes COVID-19) and stops it from infecting more cells in your body. It does not kill the virus, but it prevents it from spreading throughout your body with the hope that it will decrease your symptoms after it is administered.   This new drug is an intravenous (IV) infusion called Sotrovimab that is given over one 30-minute session in our [facility] outpatient infusion clinic. You will need to stay about 60 minutes after the infusion to ensure you are tolerating  it well and to watch for any allergic reaction to the medication. More information will be given to you at the time of your appointment.   Important information:  . The potential side effects: 2-4% of recipients experience nausea, vomiting, diarrhea, dizziness, headaches, itching, worsening fevers or chills for around 24 hours. . There have been no serious infusion-related reactions. . Of the more than 3,000 patients who received the infusion, only one had an allergic response that ended once the infusion was stopped. This is why we monitor all of our patients closely for 60 minutes after the infusion.  . The COVID-19 vaccine (including boosters) must be delayed at least 90 days after receiving this infusion.  . The medication itself is free, but your insurance will be charged an infusion fee. The amount you may owe later varies from insurance to insurance. If you do not have insurance, we can put you in touch with our billing department. Please contact your insurance agent to discuss prior to your appointment if you would like further details about billing specific to your policy. The CMS code is: M96  . If you have been tested outside of a [facility], you MUST bring a copy of your positive test with you the morning of your appointment. You may take a photo of this and upload to your MyChart portal,  have the testing facility fax the result to [phone] or email a copy to [email or web address].    You have been scheduled to receive the monoclonal antibody therapy at [place]:  03/12/20 at 11:30am    The address for the infusion clinic site is:   --  The GPS address is [address], and the parking is located near the [employer] building where you will see a "COVID19 Infusion" feather banner marking the entrance to parking. (See photos below.)            --Enter into the 2nd entrance where the "wave, flag banner" is at the road. Turn into this 2nd entrance and  immediately turn left or right to park in one of the marked spaces.   --Please stay in your car and call the desk for assistance inside at [phone]. Let us know which space you are in.    --The average time in the department is roughly 90 minutes to two hours for monoclonal treatment. This includes preparation of the medication, IV start and the required 60-minute monitoring after the infusion.     Should you develop worsening shortness of breath, chest pain or severe breathing problems, please do not wait for this appointment and go to the emergency room for evaluation and treatment instead. You will undergo another oxygen screen before your infusion to ensure this is the best treatment option for you. There is a chance that the best decision may be to send you to the emergency room for evaluation at the time of your appointment.    The day of your visit, you should: Marland Kitchen Get plenty of rest the night before and drink plenty of water. . Eat a light meal/snack before coming and take your medications as prescribed.  . Wear warm, comfortable clothes with a shirt that can roll-up over the elbow (will need IV start).  . Wear a mask.  . Consider bringing an activity to help pass the time.    Terri Piedra, NP 03/11/2020 2:47 PM

## 2020-03-12 ENCOUNTER — Ambulatory Visit (HOSPITAL_COMMUNITY)
Admission: RE | Admit: 2020-03-12 | Discharge: 2020-03-12 | Disposition: A | Discharge status: Home / Self Care | Payer: Medicare Other | Source: Ambulatory Visit | Attending: Pulmonary Disease | Admitting: Pulmonary Disease

## 2020-03-12 DIAGNOSIS — U071 COVID-19: Secondary | ICD-10-CM

## 2020-03-12 DIAGNOSIS — Z6827 Body mass index (BMI) 27.0-27.9, adult: Secondary | ICD-10-CM | POA: Diagnosis not present

## 2020-03-12 DIAGNOSIS — I700 Atherosclerosis of aorta: Secondary | ICD-10-CM | POA: Diagnosis not present

## 2020-03-12 DIAGNOSIS — E782 Mixed hyperlipidemia: Secondary | ICD-10-CM | POA: Diagnosis not present

## 2020-03-12 MED ORDER — ALBUTEROL SULFATE HFA 108 (90 BASE) MCG/ACT IN AERS: 2.0000 | INHALATION_SPRAY | Freq: Once | RESPIRATORY_TRACT | Status: DC | PRN

## 2020-03-12 MED ORDER — SODIUM CHLORIDE 0.9 % IV SOLN: INTRAVENOUS | Status: DC | PRN

## 2020-03-12 MED ORDER — FAMOTIDINE IN NACL 20-0.9 MG/50ML-% IV SOLN: 20.0000 mg | Freq: Once | INTRAVENOUS | Status: DC | PRN

## 2020-03-12 MED ORDER — METHYLPREDNISOLONE SODIUM SUCC 125 MG IJ SOLR: 125.0000 mg | Freq: Once | INTRAMUSCULAR | Status: DC | PRN

## 2020-03-12 MED ORDER — SOTROVIMAB 500 MG/8ML IV SOLN
500.0000 mg | Freq: Once | INTRAVENOUS | Status: AC
  Administered 2020-03-12: 500 mg via INTRAVENOUS

## 2020-03-12 MED ORDER — DIPHENHYDRAMINE HCL 50 MG/ML IJ SOLN: 50.0000 mg | Freq: Once | INTRAMUSCULAR | Status: DC | PRN

## 2020-03-12 MED ORDER — EPINEPHRINE 0.3 MG/0.3ML IJ SOAJ: 0.3000 mg | Freq: Once | INTRAMUSCULAR | Status: DC | PRN

## 2020-03-12 NOTE — Discharge Instructions (Signed)

## 2020-03-12 NOTE — Progress Notes (Signed)
Patient reviewed Fact Sheet for Patients, Parents, and Caregivers for Emergency Use Authorization (EUA) of sotrovimab for the Treatment of Coronavirus. Patient also reviewed and is agreeable to the estimated cost of treatment. Patient is agreeable to proceed.   

## 2020-03-12 NOTE — Progress Notes (Signed)
Diagnosis: COVID-19  Physician: Dr. Patrick Wright  Procedure: Covid Infusion Clinic Med: Sotrovimab infusion - Provided patient with sotrovimab fact sheet for patients, parents, and caregivers prior to infusion.   Complications: No immediate complications noted  Discharge: Discharged home    

## 2020-03-18 ENCOUNTER — Telehealth: Payer: Medicare Other | Admitting: Physician Assistant

## 2020-03-18 ENCOUNTER — Encounter: Payer: Self-pay | Admitting: Physician Assistant

## 2020-03-18 ENCOUNTER — Telehealth (INDEPENDENT_AMBULATORY_CARE_PROVIDER_SITE_OTHER): Payer: Medicare Other | Admitting: Physician Assistant

## 2020-03-18 VITALS — Ht 67.0 in | Wt 147.0 lb

## 2020-03-18 DIAGNOSIS — J060 Acute laryngopharyngitis: Secondary | ICD-10-CM | POA: Diagnosis not present

## 2020-03-18 HISTORY — DX: Acute laryngopharyngitis: J06.0

## 2020-03-18 MED ORDER — BENZONATATE 200 MG PO CAPS: 200.0000 mg | ORAL_CAPSULE | Freq: Two times a day (BID) | ORAL | 0 refills | Status: DC | PRN

## 2020-03-18 MED ORDER — AZITHROMYCIN 250 MG PO TABS: ORAL_TABLET | ORAL | 0 refills | Status: DC

## 2020-03-18 NOTE — Progress Notes (Signed)
Virtual Visit via Telephone Note   This visit type was conducted due to national recommendations for restrictions regarding the COVID-19 Pandemic (e.g. social distancing) in an effort to limit this patient's exposure and mitigate transmission in our community.  Due to her co-morbid illnesses, this patient is at least at moderate risk for complications without adequate follow up.  This format is felt to be most appropriate for this patient at this time.  The patient did not have access to video technology/had technical difficulties with video requiring transitioning to audio format only (telephone).  All issues noted in this document were discussed and addressed.  No physical exam could be performed with this format.  Patient verbally consented to a telehealth visit.   Date:  03/18/2020   ID:  [name], DOB Jun 18, 1950, MRN [id number]  Patient Location: Home Provider Location: Office  PCP:  Marge Duncans, PA-C    Chief Complaint:  URI/cough  History of Present Illness:    [name] is a 70 y.o. female with cough and congestion - she was diagnosed with COVID on 1/19 and received IV infusion - states she is still having achiness and cough - denies fever or dyspnea Has had mild congestion and sore throat  The patient does not have symptoms concerning for COVID-19 infection (fever, chills, cough, or new shortness of breath).    Past Medical History:  Diagnosis Date  . Abnormal chest x-ray 01/19/2020  . Aortic atherosclerosis ([place]) 01/28/2020  . Cough 01/12/2020  . Hiatal hernia   . Hyperlipemia   . 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 Visit for screening mammogram 01/28/2020   History reviewed. No pertinent surgical history.   Current Meds  Medication Sig  . acetaminophen (TYLENOL) 500 MG tablet Take 500 mg by mouth every 6 (six) hours as needed for mild pain.  . Ascorbic Acid (VITAMIN C PO) Take 3 tablets by mouth daily.   Marland Kitchen azithromycin (ZITHROMAX) 250 MG tablet 2 po day one then one po days 2-5  . benzonatate (TESSALON) 200 MG capsule Take 1 capsule (200 mg total) by mouth 2 (two) times daily as needed for cough.  . Coenzyme Q10 (CO Q-10 PO) Take 2 tablets by mouth daily.  Marland Kitchen ibuprofen (ADVIL) 200 MG tablet Take 200 mg by mouth every 6 (six) hours as needed for mild pain.  . Multiple Vitamins-Minerals (MULTI FOR HER 50+ PO) Take 2 tablets by mouth daily.  Marland Kitchen omeprazole (PRILOSEC) 20 MG capsule Take 1 capsule (20 mg total) by mouth daily. (Patient taking differently: Take 20 mg by mouth 2 (two) times daily before a meal.)  . rosuvastatin (CRESTOR) 5 MG tablet Take 1 tablet (5 mg total) by mouth daily. (Patient taking differently: Take 5 mg by mouth daily with supper.)  . VITAMIN D PO Take 2 tablets by mouth daily.     Allergies:   Codeine and Surgical lubricant   Social History   Tobacco Use  . Smoking status: Former Smoker    Years: 50.00    Types: Cigarettes    Quit date: 03/22/2017    Years since quitting: 2.9  . Smokeless tobacco: Never Used  Vaping Use  . Vaping Use: Never used  Substance Use Topics  . Alcohol use: Not Currently  . Drug use: Never     Family Hx: The patient's family history includes Cancer in her father; Diabetes in her father; Heart attack in her brother and mother;  Heart disease in her brother and mother; High Cholesterol in her brother and brother; Non-Hodgkin's lymphoma in her brother.  ROS:   Please see the history of present illness.    All other systems reviewed and are negative.  Labs/Other Tests and Data Reviewed:    Recent Labs: No results found for requested labs within last 8760 hours.   Recent Lipid Panel No results found for: CHOL, TRIG, HDL, CHOLHDL, LDLCALC, LDLDIRECT  Wt Readings from Last 3 Encounters:  03/18/20 147 lb (66.7 kg)  03/10/20 153 lb (69.4 kg)  03/04/20 153 lb (69.4 kg)     Objective:    Vital Signs:  Ht 5\' 7"  (1.702 m)   Wt 147 lb  (66.7 kg)   SpO2 98%   BMI 23.02 kg/m    VITAL SIGNS:  reviewed  ASSESSMENT & PLAN:    1. URI - rx for zpack and tessalon - will recheck next week in office before her scheduled surgery on 2/7  COVID-19 Education: The signs and symptoms of COVID-19 were discussed with the patient and how to seek care for testing (follow up with PCP or arrange E-visit). The importance of social distancing was discussed today.  Time:   Today, I have spent 10 minutes with the patient with telehealth technology discussing the above problems.     Medication Adjustments/Labs and Tests Ordered: Current medicines are reviewed at length with the patient today.  Concerns regarding medicines are outlined above.   Tests Ordered: No orders of the defined types were placed in this encounter.   Medication Changes: Meds ordered this encounter  Medications  . azithromycin (ZITHROMAX) 250 MG tablet    Sig: 2 po day one then one po days 2-5    Dispense:  6 each    Refill:  0    Order Specific Question:   Supervising Provider    AnswerShelton Silvas  . benzonatate (TESSALON) 200 MG capsule    Sig: Take 1 capsule (200 mg total) by mouth 2 (two) times daily as needed for cough.    Dispense:  30 capsule    Refill:  0    Order Specific Question:   Supervising Provider    AnswerShelton Silvas    Follow Up:  In Person next week for recheck in office  Signed, Webb Silversmith, PA-C  03/18/2020 1:40 PM    [place]

## 2020-03-23 ENCOUNTER — Encounter: Payer: Self-pay | Admitting: Physician Assistant

## 2020-03-23 ENCOUNTER — Ambulatory Visit (INDEPENDENT_AMBULATORY_CARE_PROVIDER_SITE_OTHER): Payer: Medicare Other | Admitting: Physician Assistant

## 2020-03-23 VITALS — BP 112/62 | HR 94 | Temp 97.3°F | Ht 67.0 in | Wt 144.8 lb

## 2020-03-23 DIAGNOSIS — J060 Acute laryngopharyngitis: Secondary | ICD-10-CM | POA: Diagnosis not present

## 2020-03-23 MED ORDER — AZITHROMYCIN 250 MG PO TABS: ORAL_TABLET | ORAL | 0 refills | Status: DC

## 2020-03-23 NOTE — Progress Notes (Signed)
Acute Office Visit  Subjective:    Patient ID: [name], female    DOB: 1950/03/05, 70 y.o.   MRN: [id number]  Chief Complaint  Patient presents with  . Cough    congestion    HPI Patient is in today for cough and congestion and concerned because she is having lung surgery on Monday - She was diagnosed with COVID on 03/10/20 and is feeling a lot better however has kept some cough She denies fever but still has some mild malaise - had lost some weight because she had not been eating/drinking but daughter with her today states that she is doing much better  Past Medical History:  Diagnosis Date  . Abnormal chest x-ray 01/19/2020  . Aortic atherosclerosis ([place]) 01/28/2020  . Cough 01/12/2020  . Hiatal hernia   . Hyperlipemia   . 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 Stenosis of left subclavian artery ([employer]) 01/28/2020  . Visit for screening mammogram 01/28/2020    History reviewed. No pertinent surgical history.  Family History  Problem Relation Age of Onset  . Heart disease Mother   . Heart attack Mother   . Cancer Father   . Diabetes Father   . Non-Hodgkin's lymphoma Brother   . High Cholesterol Brother   . High Cholesterol Brother   . Heart disease Brother   . Heart attack Brother     Social History   Socioeconomic History  . Marital status: Widowed    Spouse name: Not on file  . Number of children: Not on file  . Years of education: Not on file  . Highest education level: Not on file  Occupational History  . Not on file  Tobacco Use  . Smoking status: Former Smoker    Years: 50.00    Types: Cigarettes    Quit date: 03/22/2017    Years since quitting: 3.0  . Smokeless tobacco: Never Used  Vaping Use  . Vaping Use: Never used  Substance and Sexual Activity  . Alcohol use: Not Currently  . Drug use: Never  . Sexual activity: Not Currently  Other Topics Concern  . Not on file  Social History Narrative  . Not on file   Social Determinants of Health    Financial Resource Strain: Not on file  Food Insecurity: Not on file  Transportation Needs: Not on file  Physical Activity: Not on file  Stress: Not on file  Social Connections: Not on file  Intimate Partner Violence: Not on file    Outpatient Medications Prior to Visit  Medication Sig Dispense Refill  . acetaminophen (TYLENOL) 500 MG tablet Take 500 mg by mouth every 6 (six) hours as needed for mild pain.    . Ascorbic Acid (VITAMIN C PO) Take 3 tablets by mouth daily.    . benzonatate (TESSALON) 200 MG capsule Take 1 capsule (200 mg total) by mouth 2 (two) times daily as needed for cough. 30 capsule 0  . Coenzyme Q10 (CO Q-10 PO) Take 2 tablets by mouth daily.    Marland Kitchen ibuprofen (ADVIL) 200 MG tablet Take 200 mg by mouth every 6 (six) hours as needed for mild pain.    . Multiple Vitamins-Minerals (MULTI FOR HER 50+ PO) Take 2 tablets by mouth daily.    Marland Kitchen omeprazole (PRILOSEC) 20 MG capsule Take 1 capsule (20 mg total) by mouth daily. (Patient taking differently: Take 20 mg by mouth 2 (two) times daily before a meal.) 90 capsule 3  . rosuvastatin (CRESTOR)  5 MG tablet Take 1 tablet (5 mg total) by mouth daily. (Patient taking differently: Take 5 mg by mouth daily with supper.) 30 tablet 2  . VITAMIN D PO Take 2 tablets by mouth daily.    Marland Kitchen azithromycin (ZITHROMAX) 250 MG tablet 2 po day one then one po days 2-5 6 each 0   No facility-administered medications prior to visit.    Allergies  Allergen Reactions  . Codeine Hives  . Surgical Lubricant Hives    Review of Systems CONSTITUTIONAL: Negative for chills, fatigue, fever, unintentional weight gain and unintentional weight loss.  E/N/T: Negative for ear pain, nasal congestion and sore throat.  CARDIOVASCULAR: Negative for chest pain, dizziness, palpitations and pedal edema.  RESPIRATORY: see HPI PSYCHIATRIC: Negative for sleep disturbance and to question depression screen.  Negative for depression, negative for anhedonia.          Objective:    Physical Exam PHYSICAL EXAM:   VS: BP 112/62 (BP Location: Right Arm, Patient Position: Sitting, Cuff Size: Normal)   Pulse 94   Temp (!) 97.3 F (36.3 C) (Temporal)   Ht _0  (1.702 m)   Wt 144 lb 12.8 oz (65.7 kg)   SpO2 96%   BMI 22.68 kg/m   GEN: Well nourished, well developed, in no acute distress   Cardiac: RRR; no murmurs, rubs, or gallops,no edema -  Respiratory:  normal respiratory rate and pattern with no distress - rare exp rhonchi noted but clears with cough Skin: warm and dry, no rash  Psych: euthymic mood, appropriate affect and demeanor  BP 112/62 (BP Location: Right Arm, Patient Position: Sitting, Cuff Size: Normal)   Pulse 94   Temp (!) 97.3 F (36.3 C) (Temporal)   Ht _1  (1.702 m)   Wt 144 lb 12.8 oz (65.7 kg)   SpO2 96%   BMI 22.68 kg/m  Wt Readings from Last 3 Encounters:  03/23/20 144 lb 12.8 oz (65.7 kg)  03/18/20 147 lb (66.7 kg)  03/10/20 153 lb (69.4 kg)    Health Maintenance Due  Topic Date Due  . Hepatitis C Screening  Never done  . DEXA SCAN  Never done  . PNA vac Low Risk Adult (2 of 2 - PPSV23) 02/19/2019  . COVID-19 Vaccine (2 - Pfizer 3-dose series) 03/18/2020    There are no preventive care reminders to display for this patient.   No results found for: TSH No results found for: WBC, HGB, HCT, MCV, PLT No results found for: NA, K, CHLORIDE, CO2, GLUCOSE, BUN, CREATININE, BILITOT, ALKPHOS, AST, ALT, PROT, ALBUMIN, CALCIUM, ANIONGAP, EGFR, GFR No results found for: CHOL No results found for: HDL No results found for: LDLCALC No results found for: TRIG No results found for: CHOLHDL No results found for: HGBA1C     Assessment & Plan:  1. Acute laryngopharyngitis - azithromycin (ZITHROMAX) 250 MG tablet; 2 po day one then one po days 2-5  Dispense: 6 tablet; Refill: 0    Meds ordered this encounter  Medications  . azithromycin (ZITHROMAX) 250 MG tablet    Sig: 2 po day one then one po days 2-5     Dispense:  6 tablet    Refill:  0    Order Specific Question:   Supervising Provider    Answer:   Shelton Silvas    No orders of the defined types were placed in this encounter.     Follow-up: Return if symptoms worsen or fail to improve.  An  After Visit Summary was printed and given to the patient.  Yetta Flock [facility] [phone]

## 2020-03-24 NOTE — Progress Notes (Signed)
Surgical Instructions    Your procedure is scheduled on Monday, March 29, 2020 from 7:30AM - 11:47AM .  Report to [facility] Main Entrance "A" at 5:30 A.M., then check in with the Admitting office.  Call this number if you have problems the morning of surgery:  [phone]   If you have any questions prior to your surgery date call [phone]: Open Monday-Friday 8am-4pm    Remember:  Do not eat or drink after midnight the night before your surgery    Take these medicines the morning of surgery with A SIP OF WATER:  omeprazole (PRILOSEC) acetaminophen (TYLENOL) - if needed benzonatate (TESSALON) - if needed  As of today, STOP taking any Aspirin (unless otherwise instructed by your surgeon) Aleve, Naproxen, Ibuprofen, Motrin, Advil, Goody's, BC's, all herbal medications, fish oil, and all vitamins.                     Do not wear jewelry, make up, or nail polish            Do not wear lotions, powders, perfumes, or deodorant.            Do not shave 48 hours prior to surgery.            Do not bring valuables to the hospital.            [facility] is not responsible for any belongings or valuables.  Do NOT Smoke (Tobacco/Vaping) or drink Alcohol 24 hours prior to your procedure If you use a CPAP at night, you may bring all equipment for your overnight stay.   Contacts, glasses, dentures or bridgework may not be worn into surgery, please bring cases for these belongings   For patients admitted to the hospital, discharge time will be determined by your treatment team.   Patients discharged the day of surgery will not be allowed to drive home, and someone needs to stay with them for 24 hours.    Special instructions:   [facility]- Preparing For Surgery  Before surgery, you can play an important role. Because skin is not sterile, your skin needs to be as free of germs as possible. You can reduce the number of germs on your skin by washing with CHG (chlorahexidine gluconate)  Soap before surgery.  CHG is an antiseptic cleaner which kills germs and bonds with the skin to continue killing germs even after washing.    Oral Hygiene is also important to reduce your risk of infection.  Remember - BRUSH YOUR TEETH THE MORNING OF SURGERY WITH YOUR REGULAR TOOTHPASTE  Please do not use if you have an allergy to CHG or antibacterial soaps. If your skin becomes reddened/irritated stop using the CHG.  Do not shave (including legs and underarms) for at least 48 hours prior to first CHG shower. It is OK to shave your face.  Please follow these instructions carefully.   1. If you chose to wash your hair, wash your hair first as usual with your normal shampoo.  2. After you shampoo, rinse your hair and body thoroughly to remove the shampoo.  3. Wash Face and genitals (private parts) with your normal soap.   4. THEN Shower the NIGHT BEFORE SURGERY and the MORNING OF SURGERY with CHG Soap.   5. Use CHG as you would any other liquid soap. You can apply CHG directly to the skin and wash gently with a scrungie or a clean washcloth.   6. Apply the CHG  Soap to your body ONLY FROM THE NECK DOWN.  Do not use on open wounds or open sores. Avoid contact with your eyes, ears, mouth and genitals (private parts). Wash Face and genitals (private parts)  with your normal soap.   7. Wash thoroughly, paying special attention to the area where your surgery will be performed.  8. Thoroughly rinse your body with warm water from the neck down.  9. DO NOT shower/wash with your normal soap after using and rinsing off the CHG Soap.  10. Pat yourself dry with a CLEAN TOWEL.  11. Wear CLEAN PAJAMAS to bed the night before surgery  12. Place CLEAN SHEETS on your bed the night before your surgery  13. DO NOT SLEEP WITH PETS.   Day of Surgery: Wear Clean/Comfortable clothing the morning of surgery Do not apply any deodorants/lotions.   Remember to brush your teeth WITH YOUR REGULAR  TOOTHPASTE.   Please read over the following fact sheets that you were given.

## 2020-03-25 ENCOUNTER — Other Ambulatory Visit (HOSPITAL_COMMUNITY): Payer: Medicare Other

## 2020-03-25 ENCOUNTER — Encounter (HOSPITAL_COMMUNITY)
Admission: RE | Admit: 2020-03-25 | Discharge: 2020-03-25 | Disposition: A | Discharge status: Home / Self Care | Payer: Medicare Other | Source: Ambulatory Visit | Attending: Cardiothoracic Surgery | Admitting: Cardiothoracic Surgery

## 2020-03-25 ENCOUNTER — Other Ambulatory Visit: Payer: Self-pay

## 2020-03-25 DIAGNOSIS — J449 Chronic obstructive pulmonary disease, unspecified: Secondary | ICD-10-CM | POA: Diagnosis not present

## 2020-03-25 DIAGNOSIS — I2510 Atherosclerotic heart disease of native coronary artery without angina pectoris: Secondary | ICD-10-CM | POA: Insufficient documentation

## 2020-03-25 DIAGNOSIS — R918 Other nonspecific abnormal finding of lung field: Secondary | ICD-10-CM

## 2020-03-25 DIAGNOSIS — Z87891 Personal history of nicotine dependence: Secondary | ICD-10-CM | POA: Diagnosis not present

## 2020-03-25 DIAGNOSIS — Z8616 Personal history of COVID-19: Secondary | ICD-10-CM | POA: Insufficient documentation

## 2020-03-25 DIAGNOSIS — Z01812 Encounter for preprocedural laboratory examination: Secondary | ICD-10-CM | POA: Diagnosis not present

## 2020-03-25 HISTORY — DX: Gastro-esophageal reflux disease without esophagitis: K21.9

## 2020-03-25 HISTORY — DX: Pneumonia, unspecified organism: J18.9

## 2020-03-25 HISTORY — DX: Dyspnea, unspecified: R06.00

## 2020-03-25 HISTORY — DX: Chronic obstructive pulmonary disease, unspecified: J44.9

## 2020-03-25 HISTORY — DX: Family history of other specified conditions: Z84.89

## 2020-03-25 HISTORY — DX: Unspecified osteoarthritis, unspecified site: M19.90

## 2020-03-25 LAB — BLOOD GAS, ARTERIAL
Acid-Base Excess: 2.2 mmol/L — ABNORMAL HIGH (ref 0.0–2.0)
Bicarbonate: 26 mmol/L (ref 20.0–28.0)
FIO2: 21
O2 Saturation: 96 %
Patient temperature: 37
pCO2 arterial: 39.1 mmHg (ref 32.0–48.0)
pH, Arterial: 7.439 (ref 7.350–7.450)
pO2, Arterial: 78.8 mmHg — ABNORMAL LOW (ref 83.0–108.0)

## 2020-03-25 LAB — CBC
HCT: 39.7 % (ref 36.0–46.0)
Hemoglobin: 13.3 g/dL (ref 12.0–15.0)
MCH: 30.1 pg (ref 26.0–34.0)
MCHC: 33.5 g/dL (ref 30.0–36.0)
MCV: 89.8 fL (ref 80.0–100.0)
Platelets: 332 10*3/uL (ref 150–400)
RBC: 4.42 MIL/uL (ref 3.87–5.11)
RDW: 13.4 % (ref 11.5–15.5)
WBC: 7.7 10*3/uL (ref 4.0–10.5)
nRBC: 0 % (ref 0.0–0.2)

## 2020-03-25 LAB — URINALYSIS, ROUTINE W REFLEX MICROSCOPIC
Bilirubin Urine: NEGATIVE
Glucose, UA: NEGATIVE mg/dL
Hgb urine dipstick: NEGATIVE
Ketones, ur: NEGATIVE mg/dL
Nitrite: NEGATIVE
Protein, ur: NEGATIVE mg/dL
Specific Gravity, Urine: 1.017 (ref 1.005–1.030)
pH: 6 (ref 5.0–8.0)

## 2020-03-25 LAB — TYPE AND SCREEN
ABO/RH(D): A NEG
Antibody Screen: NEGATIVE

## 2020-03-25 LAB — COMPREHENSIVE METABOLIC PANEL
ALT: 25 U/L (ref 0–44)
AST: 29 U/L (ref 15–41)
Albumin: 3.4 g/dL — ABNORMAL LOW (ref 3.5–5.0)
Alkaline Phosphatase: 62 U/L (ref 38–126)
Anion gap: 13 (ref 5–15)
BUN: 9 mg/dL (ref 8–23)
CO2: 21 mmol/L — ABNORMAL LOW (ref 22–32)
Calcium: 9.2 mg/dL (ref 8.9–10.3)
Chloride: 101 mmol/L (ref 98–111)
Creatinine, Ser: 0.99 mg/dL (ref 0.44–1.00)
GFR, Estimated: >60 mL/min (ref >60)
Glucose, Bld: 89 mg/dL (ref 70–99)
Potassium: 4.5 mmol/L (ref 3.5–5.1)
Sodium: 135 mmol/L (ref 135–145)
Total Bilirubin: 0.3 mg/dL (ref 0.3–1.2)
Total Protein: 6.4 g/dL — ABNORMAL LOW (ref 6.5–8.1)

## 2020-03-25 LAB — PROTIME-INR
INR: 1 (ref 0.8–1.2)
Prothrombin Time: 12.3 seconds (ref 11.4–15.2)

## 2020-03-25 LAB — SURGICAL PCR SCREEN
MRSA, PCR: NEGATIVE
Staphylococcus aureus: NEGATIVE

## 2020-03-25 LAB — APTT: aPTT: 33 seconds (ref 24–36)

## 2020-03-25 NOTE — Progress Notes (Signed)
PCP - Mathews Robinsons PA [facility] in [place] Cardiologist - Revankar  Chest x-ray - DOS EKG - 03/01/20 Stress Test - 03/05/20 ECHO - 03/03/20 Cardiac Cath - na  Sleep Study - na   Blood Thinner Instructions: na Aspirin Instruction: na  COVID TEST- pt. Was positive 03/10/20, retested not needed   Anesthesia review: medical hx/post covid/clearance from Dr. Geraldo Pitter  Patient denies shortness of breath, fever, cough and chest pain at PAT appointment   All instructions explained to the patient, with a verbal understanding of the material. Patient agrees to go over the instructions while at home for a better understanding. Patient also instructed to self quarantine after being tested for COVID-19. The opportunity to ask questions was provided.

## 2020-03-26 NOTE — Progress Notes (Signed)
Anesthesia Chart Review:  Patient diagnosed with COVID-19 on 1/19, symptoms were mild, was treated outpatient with Sotrovimab infusion on 1/21.  Seen by PCP 03/23/2020, noted to be feeling much better, had some mild congestion and cough, treated with azithromycin in light of upcoming surgery.  At preadmission testing appointment on 03/25/2020 she denied any shortness of breath, fever, cough.  History of multivessel coronary calcification on CT and cardiac murmur.  He was referred to cardiologist Dr. Geraldo Pitter for preoperative stratification.  Nuclear stress and echo ordered.  Per Dr. Julien Nordmann note, "1.  In view of multivessel calcification and multiple risk factors we will do a Lexiscan sestamibi.  If this is negative then she is not at high risk for coronary events during the aforementioned surgery.  Meticulous hemodynamic monitoring will further reduce the risk of coronary events." Nuclear stress 03/04/2020 was low risk: Echo 03/03/2020 showed EF 60 to 65%, grade 1 DD, mild MR, mild TR.  Former smoker (quit 3 years ago) with history of COPD and peripheral vascular disease - left subclavian stenosis 50 to 60%.  Preop labs reviewed, unremarkable.  EKG 03/01/2020: NSR.  Rate 87.  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  No evidence of regional nodal or distal metastatic disease within the thorax.  PET CT examination may be helpful for further evaluation and staging purposes.  Surgical consultation would also be helpful for further management.  Moderate emphysema.  Moderate multivessel coronary artery calcification.  Peripheral vascular  disease with estimated 50 to 60% stenosis of the left subclavian artery at its origin.  Correlation for signs and symptoms of subclavian steal or left upper extremity claudication may be helpful.  Nuclear stress 03/04/2020:  The left ventricular ejection fraction is hyperdynamic (>65%).  Nuclear stress EF: 76%.  There was no ST segment deviation noted during stress.  The study is normal.  This is a low risk study.   TTE 03/03/2020: 1. Left ventricular ejection fraction, by estimation, is 60 to 65%. The  left ventricle has normal function. The left ventricle has no regional  wall motion abnormalities. Left ventricular diastolic parameters are  consistent with Grade I diastolic  dysfunction (impaired relaxation).  2. Right ventricular systolic function is normal. The right ventricular  size is normal. There is normal pulmonary artery systolic pressure.  3. The mitral valve is normal in structure. Mild mitral valve  regurgitation. No evidence of mitral stenosis.  4. The aortic valve is normal in structure. Aortic valve regurgitation is  not visualized. No aortic stenosis is present.  5. The inferior vena cava is normal in size with greater than 50%  respiratory variability, suggesting right atrial pressure of 3 mmHg.    Wynonia Musty [facility] Short Stay Center/Anesthesiology Phone [phone] 03/26/2020 9:08 AM

## 2020-03-26 NOTE — Anesthesia Preprocedure Evaluation (Addendum)
Anesthesia Evaluation  Patient identified by MRN, date of birth, ID band Patient awake    Reviewed: Allergy & Precautions, NPO status , Patient's Chart, lab work & pertinent test results  History of Anesthesia Complications (+) Family history of anesthesia reaction  Airway Mallampati: II  TM Distance: >3 FB Neck ROM: Full    Dental  (+) Dental Advisory Given, Missing, Partial Upper   Pulmonary COPD, former smoker,  RML [place] Covid 03/10/20   Pulmonary exam normal breath sounds clear to auscultation       Cardiovascular + CAD and + Peripheral Vascular Disease  Normal cardiovascular exam Rhythm:Regular Rate:Normal  Stenosis of left subclavian artery   Echo 03/03/20: 1. Left ventricular ejection fraction, by estimation, is 60 to 65%. The  left ventricle has normal function. The left ventricle has no regional  wall motion abnormalities. Left ventricular diastolic parameters are  consistent with Grade I diastolic  dysfunction (impaired relaxation).  2. Right ventricular systolic function is normal. The right ventricular  size is normal. There is normal pulmonary artery systolic pressure.  3. The mitral valve is normal in structure. Mild mitral valve  regurgitation. No evidence of mitral stenosis.  4. The aortic valve is normal in structure. Aortic valve regurgitation is  not visualized. No aortic stenosis is present.  5. The inferior vena cava is normal in size with greater than 50%  respiratory variability, suggesting right atrial pressure of 3 mmHg.    Neuro/Psych negative neurological ROS  negative psych ROS   GI/Hepatic Neg liver ROS, hiatal hernia, GERD  Medicated,  Endo/Other  negative endocrine ROS  Renal/GU negative Renal ROS     Musculoskeletal  (+) Arthritis ,   Abdominal   Peds  Hematology negative hematology ROS (+)   Anesthesia Other Findings   Reproductive/Obstetrics                            Anesthesia Physical Anesthesia Plan  ASA: III  Anesthesia Plan: General   Post-op Pain Management:    Induction: Intravenous  PONV Risk Score and Plan: 3 and Dexamethasone, Ondansetron and Treatment may vary due to age or medical condition  Airway Management Planned: Double Lumen EBT  Additional Equipment: Arterial line, CVP and Ultrasound Guidance Line Placement  Intra-op Plan:   Post-operative Plan: Extubation in OR  Informed Consent: I have reviewed the patients History and Physical, chart, labs and discussed the procedure including the risks, benefits and alternatives for the proposed anesthesia with the patient or authorized representative who has indicated his/her understanding and acceptance.     Dental advisory given  Plan Discussed with: CRNA  Anesthesia Plan Comments: (PAT note by Karoline Caldwell, PA-C: Patient diagnosed with COVID-19 on 1/19, symptoms were mild, was treated outpatient with Sotrovimab infusion on 1/21.  Seen by PCP 03/23/2020, noted to be feeling much better, had some mild congestion and cough, treated with azithromycin in light of upcoming surgery.  At preadmission testing appointment on 03/25/2020 she denied any shortness of breath, fever, cough.  History of multivessel coronary calcification on CT and cardiac murmur.  He was referred to cardiologist Dr. Geraldo Pitter for preoperative stratification.  Nuclear stress and echo ordered.  Per Dr. Julien Nordmann note, "1.  In view of multivessel calcification and multiple risk factors we will do a Lexiscan sestamibi.  If this is negative then she is not at high risk for coronary events during the aforementioned surgery.  Meticulous hemodynamic monitoring will further reduce  the risk of coronary events." Nuclear stress 03/04/2020 was low risk: Echo 03/03/2020 showed EF 60 to 65%, grade 1 DD, mild MR, mild TR.  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  Preop labs reviewed, unremarkable.  EKG 03/01/2020: NSR.  Rate 87.  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  No evidence of regional nodal or distal metastatic disease within the thorax.  PET CT examination may be helpful for further evaluation and staging purposes.  Surgical consultation would also be helpful for further management.  Moderate emphysema.  Moderate multivessel coronary artery calcification.  Peripheral vascular disease with estimated 50 to 60% stenosis of the left subclavian artery at its origin.  Correlation for signs and symptoms of subclavian steal or left upper extremity claudication may be helpful.  Nuclear stress 03/04/2020: The left ventricular ejection fraction is hyperdynamic (>65%). Nuclear stress EF: 76%. There was no ST segment deviation noted during stress. The study is normal. This is a low risk study.   TTE 03/03/2020: 1. Left ventricular ejection fraction, by estimation, is 60 to 65%. The  left ventricle has normal function. The left ventricle has no regional  wall motion abnormalities. Left ventricular diastolic parameters are  consistent with Grade I diastolic  dysfunction (impaired relaxation).  2. Right ventricular systolic function is normal. The right ventricular  size is normal. There is normal pulmonary artery systolic pressure.  3. The mitral valve is normal in structure. Mild mitral valve  regurgitation. No evidence of mitral stenosis.  4. The aortic valve is normal in structure. Aortic valve regurgitation is  not  visualized. No aortic stenosis is present.  5. The inferior vena cava is normal in size with greater than 50%  respiratory variability, suggesting right atrial pressure of 3 mmHg.  )      Anesthesia Quick Evaluation

## 2020-03-29 ENCOUNTER — Inpatient Hospital Stay (HOSPITAL_COMMUNITY): Payer: Medicare Other | Admitting: Anesthesiology

## 2020-03-29 ENCOUNTER — Other Ambulatory Visit: Payer: Self-pay

## 2020-03-29 ENCOUNTER — Inpatient Hospital Stay (HOSPITAL_COMMUNITY): Payer: Medicare Other | Admitting: Physician Assistant

## 2020-03-29 ENCOUNTER — Inpatient Hospital Stay (HOSPITAL_COMMUNITY): Payer: Medicare Other

## 2020-03-29 ENCOUNTER — Encounter (HOSPITAL_COMMUNITY)
Admission: RE | Disposition: A | Discharge status: Home / Self Care | Payer: Self-pay | Source: Home / Self Care | Attending: Cardiothoracic Surgery

## 2020-03-29 ENCOUNTER — Inpatient Hospital Stay (HOSPITAL_COMMUNITY)
Admission: RE | Admit: 2020-03-29 | Discharge: 2020-04-03 | DRG: 164 | Disposition: A | Discharge status: Home / Self Care | Payer: Medicare Other | Attending: Cardiothoracic Surgery | Admitting: Cardiothoracic Surgery

## 2020-03-29 DIAGNOSIS — J9811 Atelectasis: Secondary | ICD-10-CM | POA: Diagnosis not present

## 2020-03-29 DIAGNOSIS — R079 Chest pain, unspecified: Secondary | ICD-10-CM

## 2020-03-29 DIAGNOSIS — C3490 Malignant neoplasm of unspecified part of unspecified bronchus or lung: Secondary | ICD-10-CM | POA: Diagnosis not present

## 2020-03-29 DIAGNOSIS — Z9689 Presence of other specified functional implants: Secondary | ICD-10-CM

## 2020-03-29 DIAGNOSIS — I2510 Atherosclerotic heart disease of native coronary artery without angina pectoris: Secondary | ICD-10-CM | POA: Diagnosis not present

## 2020-03-29 DIAGNOSIS — Z833 Family history of diabetes mellitus: Secondary | ICD-10-CM

## 2020-03-29 DIAGNOSIS — Z807 Family history of other malignant neoplasms of lymphoid, hematopoietic and related tissues: Secondary | ICD-10-CM

## 2020-03-29 DIAGNOSIS — J9383 Other pneumothorax: Secondary | ICD-10-CM | POA: Diagnosis not present

## 2020-03-29 DIAGNOSIS — Z4682 Encounter for fitting and adjustment of non-vascular catheter: Secondary | ICD-10-CM

## 2020-03-29 DIAGNOSIS — J939 Pneumothorax, unspecified: Secondary | ICD-10-CM | POA: Diagnosis not present

## 2020-03-29 DIAGNOSIS — D62 Acute posthemorrhagic anemia: Secondary | ICD-10-CM | POA: Diagnosis not present

## 2020-03-29 DIAGNOSIS — J449 Chronic obstructive pulmonary disease, unspecified: Secondary | ICD-10-CM | POA: Diagnosis not present

## 2020-03-29 DIAGNOSIS — Z6823 Body mass index (BMI) 23.0-23.9, adult: Secondary | ICD-10-CM

## 2020-03-29 DIAGNOSIS — Z885 Allergy status to narcotic agent status: Secondary | ICD-10-CM

## 2020-03-29 DIAGNOSIS — J189 Pneumonia, unspecified organism: Secondary | ICD-10-CM | POA: Diagnosis not present

## 2020-03-29 DIAGNOSIS — J439 Emphysema, unspecified: Secondary | ICD-10-CM | POA: Diagnosis not present

## 2020-03-29 DIAGNOSIS — Z8616 Personal history of COVID-19: Secondary | ICD-10-CM | POA: Diagnosis not present

## 2020-03-29 DIAGNOSIS — C3401 Malignant neoplasm of right main bronchus: Secondary | ICD-10-CM | POA: Diagnosis not present

## 2020-03-29 DIAGNOSIS — Z83438 Family history of other disorder of lipoprotein metabolism and other lipidemia: Secondary | ICD-10-CM | POA: Diagnosis not present

## 2020-03-29 DIAGNOSIS — Z87891 Personal history of nicotine dependence: Secondary | ICD-10-CM | POA: Diagnosis not present

## 2020-03-29 DIAGNOSIS — Z888 Allergy status to other drugs, medicaments and biological substances status: Secondary | ICD-10-CM | POA: Diagnosis not present

## 2020-03-29 DIAGNOSIS — Z09 Encounter for follow-up examination after completed treatment for conditions other than malignant neoplasm: Secondary | ICD-10-CM

## 2020-03-29 DIAGNOSIS — Z8249 Family history of ischemic heart disease and other diseases of the circulatory system: Secondary | ICD-10-CM | POA: Diagnosis not present

## 2020-03-29 DIAGNOSIS — R110 Nausea: Secondary | ICD-10-CM | POA: Diagnosis not present

## 2020-03-29 DIAGNOSIS — R918 Other nonspecific abnormal finding of lung field: Secondary | ICD-10-CM | POA: Diagnosis not present

## 2020-03-29 DIAGNOSIS — C342 Malignant neoplasm of middle lobe, bronchus or lung: Principal | ICD-10-CM | POA: Diagnosis present

## 2020-03-29 DIAGNOSIS — Z9889 Other specified postprocedural states: Secondary | ICD-10-CM | POA: Diagnosis not present

## 2020-03-29 DIAGNOSIS — K219 Gastro-esophageal reflux disease without esophagitis: Secondary | ICD-10-CM | POA: Diagnosis present

## 2020-03-29 DIAGNOSIS — J90 Pleural effusion, not elsewhere classified: Secondary | ICD-10-CM | POA: Diagnosis not present

## 2020-03-29 DIAGNOSIS — E785 Hyperlipidemia, unspecified: Secondary | ICD-10-CM | POA: Diagnosis not present

## 2020-03-29 DIAGNOSIS — E46 Unspecified protein-calorie malnutrition: Secondary | ICD-10-CM | POA: Diagnosis not present

## 2020-03-29 DIAGNOSIS — C3491 Malignant neoplasm of unspecified part of right bronchus or lung: Secondary | ICD-10-CM | POA: Diagnosis present

## 2020-03-29 DIAGNOSIS — Z01818 Encounter for other preprocedural examination: Secondary | ICD-10-CM | POA: Diagnosis not present

## 2020-03-29 DIAGNOSIS — Z452 Encounter for adjustment and management of vascular access device: Secondary | ICD-10-CM | POA: Diagnosis not present

## 2020-03-29 HISTORY — PX: LUNG REMOVAL, PARTIAL: SHX233

## 2020-03-29 LAB — POCT I-STAT 7, (LYTES, BLD GAS, ICA,H+H)
Acid-Base Excess: 0 mmol/L (ref 0.0–2.0)
Bicarbonate: 27.4 mmol/L (ref 20.0–28.0)
Calcium, Ion: 1.18 mmol/L (ref 1.15–1.40)
HCT: 37 % (ref 36.0–46.0)
Hemoglobin: 12.6 g/dL (ref 12.0–15.0)
O2 Saturation: 98 %
Patient temperature: 34.2
Potassium: 4.1 mmol/L (ref 3.5–5.1)
Sodium: 137 mmol/L (ref 135–145)
TCO2: 29 mmol/L (ref 22–32)
pCO2 arterial: 48.2 mmHg — ABNORMAL HIGH (ref 32.0–48.0)
pH, Arterial: 7.348 — ABNORMAL LOW (ref 7.350–7.450)
pO2, Arterial: 106 mmHg (ref 83.0–108.0)

## 2020-03-29 LAB — ABO/RH: ABO/RH(D): A NEG

## 2020-03-29 LAB — GLUCOSE, CAPILLARY
Glucose-Capillary: 138 mg/dL — ABNORMAL HIGH (ref 70–99)
Glucose-Capillary: 155 mg/dL — ABNORMAL HIGH (ref 70–99)
Glucose-Capillary: 123 mg/dL — ABNORMAL HIGH (ref 70–99)

## 2020-03-29 SURGERY — LOBECTOMY, LUNG, ROBOT-ASSISTED, USING VATS
Anesthesia: General | Site: Chest | Laterality: Right

## 2020-03-29 MED ORDER — BUPIVACAINE LIPOSOME 1.3 % IJ SUSP
20.0000 mL | INTRAMUSCULAR | Status: DC
  Filled 2020-03-29: qty 20

## 2020-03-29 MED ORDER — FENTANYL CITRATE (PF) 250 MCG/5ML IJ SOLN
INTRAMUSCULAR | Status: AC
  Filled 2020-03-29: qty 5

## 2020-03-29 MED ORDER — ROCURONIUM BROMIDE 10 MG/ML (PF) SYRINGE
PREFILLED_SYRINGE | INTRAVENOUS | Status: DC | PRN
  Administered 2020-03-29: 20 mg via INTRAVENOUS
  Administered 2020-03-29: 30 mg via INTRAVENOUS
  Administered 2020-03-29: 70 mg via INTRAVENOUS

## 2020-03-29 MED ORDER — KETOROLAC TROMETHAMINE 15 MG/ML IJ SOLN
INTRAMUSCULAR | Status: AC
  Filled 2020-03-29: qty 1

## 2020-03-29 MED ORDER — CEFAZOLIN SODIUM-DEXTROSE 2-4 GM/100ML-% IV SOLN
2.0000 g | INTRAVENOUS | Status: AC
  Administered 2020-03-29: 2 g via INTRAVENOUS
  Filled 2020-03-29: qty 100

## 2020-03-29 MED ORDER — ALBUMIN HUMAN 5 % IV SOLN: INTRAVENOUS | Status: DC | PRN

## 2020-03-29 MED ORDER — INSULIN ASPART 100 UNIT/ML ~~LOC~~ SOLN: 0.0000 [IU] | SUBCUTANEOUS | Status: DC

## 2020-03-29 MED ORDER — SODIUM CHLORIDE FLUSH 0.9 % IV SOLN
INTRAVENOUS | Status: DC | PRN
  Administered 2020-03-29: 90 mL

## 2020-03-29 MED ORDER — MIDAZOLAM HCL 2 MG/2ML IJ SOLN
INTRAMUSCULAR | Status: AC
  Filled 2020-03-29: qty 2

## 2020-03-29 MED ORDER — LACTATED RINGERS IV SOLN: INTRAVENOUS | Status: DC | PRN

## 2020-03-29 MED ORDER — ESMOLOL HCL 100 MG/10ML IV SOLN
INTRAVENOUS | Status: AC
  Filled 2020-03-29: qty 10

## 2020-03-29 MED ORDER — SENNOSIDES-DOCUSATE SODIUM 8.6-50 MG PO TABS
1.0000 | ORAL_TABLET | Freq: Every day | ORAL | Status: DC
  Administered 2020-03-29 – 2020-03-31 (×3): 1 via ORAL
  Filled 2020-03-29 (×3): qty 1

## 2020-03-29 MED ORDER — ACETAMINOPHEN 500 MG PO TABS
1000.0000 mg | ORAL_TABLET | Freq: Once | ORAL | Status: AC
  Administered 2020-03-29: 1000 mg via ORAL
  Filled 2020-03-29: qty 2

## 2020-03-29 MED ORDER — 0.9 % SODIUM CHLORIDE (POUR BTL) OPTIME
TOPICAL | Status: DC | PRN
  Administered 2020-03-29: 3000 mL

## 2020-03-29 MED ORDER — FENTANYL CITRATE (PF) 100 MCG/2ML IJ SOLN
INTRAMUSCULAR | Status: AC
  Filled 2020-03-29: qty 2

## 2020-03-29 MED ORDER — CHLORHEXIDINE GLUCONATE CLOTH 2 % EX PADS
6.0000 | MEDICATED_PAD | Freq: Every day | CUTANEOUS | Status: DC
  Administered 2020-03-31 – 2020-04-03 (×3): 6 via TOPICAL

## 2020-03-29 MED ORDER — ACETAMINOPHEN 500 MG PO TABS
1000.0000 mg | ORAL_TABLET | Freq: Four times a day (QID) | ORAL | Status: DC
  Administered 2020-03-29 – 2020-04-03 (×12): 1000 mg via ORAL
  Filled 2020-03-29 (×13): qty 2

## 2020-03-29 MED ORDER — DEXAMETHASONE SODIUM PHOSPHATE 10 MG/ML IJ SOLN
INTRAMUSCULAR | Status: DC | PRN
  Administered 2020-03-29: 5 mg via INTRAVENOUS

## 2020-03-29 MED ORDER — FENTANYL CITRATE (PF) 250 MCG/5ML IJ SOLN
INTRAMUSCULAR | Status: DC | PRN
  Administered 2020-03-29 (×4): 50 ug via INTRAVENOUS
  Administered 2020-03-29: 100 ug via INTRAVENOUS

## 2020-03-29 MED ORDER — MIDAZOLAM HCL 5 MG/5ML IJ SOLN
INTRAMUSCULAR | Status: DC | PRN
  Administered 2020-03-29: 1 mg via INTRAVENOUS

## 2020-03-29 MED ORDER — SODIUM CHLORIDE 0.9 % IV SOLN
INTRAVENOUS | Status: DC | PRN
Start: 2020-03-29 — End: 2020-03-31

## 2020-03-29 MED ORDER — CEFAZOLIN SODIUM-DEXTROSE 2-4 GM/100ML-% IV SOLN
2.0000 g | Freq: Three times a day (TID) | INTRAVENOUS | Status: AC
  Administered 2020-03-29 (×2): 2 g via INTRAVENOUS
  Filled 2020-03-29: qty 100

## 2020-03-29 MED ORDER — ONDANSETRON HCL 4 MG/2ML IJ SOLN: 4.0000 mg | Freq: Once | INTRAMUSCULAR | Status: DC | PRN

## 2020-03-29 MED ORDER — ACETAMINOPHEN 160 MG/5ML PO SOLN: 1000.0000 mg | Freq: Four times a day (QID) | ORAL | Status: DC

## 2020-03-29 MED ORDER — ROSUVASTATIN CALCIUM 5 MG PO TABS
5.0000 mg | ORAL_TABLET | Freq: Every day | ORAL | Status: DC
  Administered 2020-03-30 – 2020-04-02 (×4): 5 mg via ORAL
  Filled 2020-03-29 (×5): qty 1

## 2020-03-29 MED ORDER — PHENYLEPHRINE 40 MCG/ML (10ML) SYRINGE FOR IV PUSH (FOR BLOOD PRESSURE SUPPORT)
PREFILLED_SYRINGE | INTRAVENOUS | Status: DC | PRN
  Administered 2020-03-29: 80 ug via INTRAVENOUS
  Administered 2020-03-29: 120 ug via INTRAVENOUS

## 2020-03-29 MED ORDER — DEXAMETHASONE SODIUM PHOSPHATE 10 MG/ML IJ SOLN
INTRAMUSCULAR | Status: AC
  Filled 2020-03-29: qty 1

## 2020-03-29 MED ORDER — ONDANSETRON HCL 4 MG/2ML IJ SOLN
INTRAMUSCULAR | Status: AC
  Filled 2020-03-29: qty 2

## 2020-03-29 MED ORDER — PHENYLEPHRINE 40 MCG/ML (10ML) SYRINGE FOR IV PUSH (FOR BLOOD PRESSURE SUPPORT)
PREFILLED_SYRINGE | INTRAVENOUS | Status: AC
  Filled 2020-03-29: qty 10

## 2020-03-29 MED ORDER — CEFAZOLIN SODIUM-DEXTROSE 2-4 GM/100ML-% IV SOLN
INTRAVENOUS | Status: AC
  Filled 2020-03-29: qty 100

## 2020-03-29 MED ORDER — PROPOFOL 10 MG/ML IV BOLUS
INTRAVENOUS | Status: DC | PRN
  Administered 2020-03-29: 120 mg via INTRAVENOUS
  Administered 2020-03-29: 50 mg via INTRAVENOUS

## 2020-03-29 MED ORDER — TRAMADOL HCL 50 MG PO TABS
50.0000 mg | ORAL_TABLET | Freq: Four times a day (QID) | ORAL | Status: DC | PRN
  Administered 2020-03-29 – 2020-03-30 (×2): 100 mg via ORAL
  Administered 2020-03-30: 50 mg via ORAL
  Administered 2020-03-31: 100 mg via ORAL
  Filled 2020-03-29 (×3): qty 2
  Filled 2020-03-29: qty 1

## 2020-03-29 MED ORDER — ENOXAPARIN SODIUM 40 MG/0.4ML ~~LOC~~ SOLN
40.0000 mg | SUBCUTANEOUS | Status: DC
  Administered 2020-03-30 – 2020-04-03 (×4): 40 mg via SUBCUTANEOUS
  Filled 2020-03-29 (×5): qty 0.4

## 2020-03-29 MED ORDER — BISACODYL 5 MG PO TBEC
10.0000 mg | DELAYED_RELEASE_TABLET | Freq: Every day | ORAL | Status: DC
  Administered 2020-03-30 – 2020-04-03 (×5): 10 mg via ORAL
  Filled 2020-03-29 (×5): qty 2

## 2020-03-29 MED ORDER — SUGAMMADEX SODIUM 200 MG/2ML IV SOLN
INTRAVENOUS | Status: DC | PRN
  Administered 2020-03-29 (×2): 100 mg via INTRAVENOUS

## 2020-03-29 MED ORDER — PHENYLEPHRINE HCL-NACL 10-0.9 MG/250ML-% IV SOLN
INTRAVENOUS | Status: DC | PRN
  Administered 2020-03-29: 30 ug/min via INTRAVENOUS

## 2020-03-29 MED ORDER — KETOROLAC TROMETHAMINE 15 MG/ML IJ SOLN
15.0000 mg | Freq: Three times a day (TID) | INTRAMUSCULAR | Status: AC
  Administered 2020-03-29 – 2020-04-01 (×8): 15 mg via INTRAVENOUS
  Filled 2020-03-29 (×8): qty 1

## 2020-03-29 MED ORDER — PROPOFOL 10 MG/ML IV BOLUS
INTRAVENOUS | Status: AC
  Filled 2020-03-29: qty 20

## 2020-03-29 MED ORDER — LIDOCAINE 2% (20 MG/ML) 5 ML SYRINGE
INTRAMUSCULAR | Status: DC | PRN
  Administered 2020-03-29: 80 mg via INTRAVENOUS

## 2020-03-29 MED ORDER — ESMOLOL HCL 100 MG/10ML IV SOLN
INTRAVENOUS | Status: DC | PRN
  Administered 2020-03-29: 30 mg via INTRAVENOUS
  Administered 2020-03-29: 20 mg via INTRAVENOUS

## 2020-03-29 MED ORDER — FENTANYL CITRATE (PF) 100 MCG/2ML IJ SOLN
25.0000 ug | INTRAMUSCULAR | Status: DC | PRN
  Administered 2020-03-29 (×3): 25 ug via INTRAVENOUS

## 2020-03-29 MED ORDER — LIDOCAINE 2% (20 MG/ML) 5 ML SYRINGE
INTRAMUSCULAR | Status: AC
  Filled 2020-03-29: qty 5

## 2020-03-29 MED ORDER — ONDANSETRON HCL 4 MG/2ML IJ SOLN
4.0000 mg | Freq: Four times a day (QID) | INTRAMUSCULAR | Status: DC | PRN
  Administered 2020-03-29 – 2020-04-02 (×5): 4 mg via INTRAVENOUS
  Filled 2020-03-29 (×5): qty 2

## 2020-03-29 MED ORDER — ONDANSETRON HCL 4 MG/2ML IJ SOLN
INTRAMUSCULAR | Status: DC | PRN
  Administered 2020-03-29: 4 mg via INTRAVENOUS

## 2020-03-29 MED ORDER — CHLORHEXIDINE GLUCONATE 0.12 % MT SOLN
OROMUCOSAL | Status: AC
  Administered 2020-03-29: 15 mL
  Filled 2020-03-29: qty 15

## 2020-03-29 MED ORDER — BUPIVACAINE HCL (PF) 0.5 % IJ SOLN
INTRAMUSCULAR | Status: AC
  Filled 2020-03-29: qty 30

## 2020-03-29 MED ORDER — ROCURONIUM BROMIDE 10 MG/ML (PF) SYRINGE
PREFILLED_SYRINGE | INTRAVENOUS | Status: AC
  Filled 2020-03-29: qty 10

## 2020-03-29 SURGICAL SUPPLY — 133 items
ADAPTER VALVE BIOPSY EBUS (MISCELLANEOUS) IMPLANT
ADPTR VALVE BIOPSY EBUS (MISCELLANEOUS)
ANCHOR CATH FOLEY SECURE (MISCELLANEOUS) ×4 IMPLANT
APPLICATOR TIP EXT COSEAL (VASCULAR PRODUCTS) IMPLANT
BLADE CLIPPER SURG (BLADE) IMPLANT
BLADE SURG 11 STRL SS (BLADE) ×4 IMPLANT
BNDG COHESIVE 6X5 TAN STRL LF (GAUZE/BANDAGES/DRESSINGS) ×4 IMPLANT
BRUSH CYTOL CELLEBRITY 1.5X140 (MISCELLANEOUS) IMPLANT
CANISTER SUCT 3000ML PPV (MISCELLANEOUS) ×8 IMPLANT
CANNULA REDUC XI 12-8 STAPL (CANNULA) ×2
CANNULA REDUC XI 12-8MM STAPL (CANNULA) ×2
CANNULA REDUCER 12-8 DVNC XI (CANNULA) ×4 IMPLANT
CATH THORACIC 28FR (CATHETERS) IMPLANT
CATH THORACIC 36FR (CATHETERS) IMPLANT
CATH THORACIC 36FR RT ANG (CATHETERS) IMPLANT
CLIP VESOCCLUDE MED 6/CT (CLIP) ×4 IMPLANT
CNTNR URN SCR LID CUP LEK RST (MISCELLANEOUS) ×6 IMPLANT
CONN ST 1/4X3/8  BEN (MISCELLANEOUS)
CONN ST 1/4X3/8 BEN (MISCELLANEOUS) IMPLANT
CONT SPEC 4OZ STRL OR WHT (MISCELLANEOUS) ×6
COVER BACK TABLE 60X90IN (DRAPES) ×4 IMPLANT
DEFOGGER SCOPE WARMER CLEARIFY (MISCELLANEOUS) ×4 IMPLANT
DERMABOND ADVANCED (GAUZE/BANDAGES/DRESSINGS) ×2
DERMABOND ADVANCED .7 DNX12 (GAUZE/BANDAGES/DRESSINGS) ×2 IMPLANT
DISSECTOR BLUNT TIP ENDO 5MM (MISCELLANEOUS) IMPLANT
DRAIN CHANNEL 28F RND 3/8 FF (WOUND CARE) IMPLANT
DRAPE ARM DVNC X/XI (DISPOSABLE) ×8 IMPLANT
DRAPE COLUMN DVNC XI (DISPOSABLE) ×2 IMPLANT
DRAPE DA VINCI XI ARM (DISPOSABLE) ×8
DRAPE DA VINCI XI COLUMN (DISPOSABLE) ×2
DRAPE INCISE IOBAN 66X45 STRL (DRAPES) ×4 IMPLANT
DRAPE ORTHO SPLIT 77X108 STRL (DRAPES) ×2
DRAPE SURG ORHT 6 SPLT 77X108 (DRAPES) ×2 IMPLANT
DRAPE WARM FLUID 44X44 (DRAPES) IMPLANT
ELECT BLADE 4.0 EZ CLEAN MEGAD (MISCELLANEOUS) ×4
ELECT REM PT RETURN 9FT ADLT (ELECTROSURGICAL) ×4
ELECTRODE BLDE 4.0 EZ CLN MEGD (MISCELLANEOUS) ×2 IMPLANT
ELECTRODE REM PT RTRN 9FT ADLT (ELECTROSURGICAL) ×2 IMPLANT
FILTER SMOKE EVAC ULPA (FILTER) ×4 IMPLANT
FORCEPS BIOP RJ4 1.8 (CUTTING FORCEPS) IMPLANT
GAUZE KITTNER 4X8 (MISCELLANEOUS) ×8 IMPLANT
GAUZE SPONGE 4X4 12PLY STRL (GAUZE/BANDAGES/DRESSINGS) ×4 IMPLANT
GAUZE SPONGE 4X4 12PLY STRL LF (GAUZE/BANDAGES/DRESSINGS) ×4 IMPLANT
GLOVE BIO SURGEON STRL SZ 6.5 (GLOVE) ×6 IMPLANT
GLOVE BIO SURGEONS STRL SZ 6.5 (GLOVE) ×2
GOWN STRL REUS W/ TWL LRG LVL3 (GOWN DISPOSABLE) ×6 IMPLANT
GOWN STRL REUS W/TWL 2XL LVL3 (GOWN DISPOSABLE) ×4 IMPLANT
GOWN STRL REUS W/TWL LRG LVL3 (GOWN DISPOSABLE) ×6
HEMOSTAT SURGICEL 2X14 (HEMOSTASIS) ×12 IMPLANT
IRRIGATION STRYKERFLOW (MISCELLANEOUS) ×2 IMPLANT
IRRIGATOR STRYKERFLOW (MISCELLANEOUS) ×4
KIT BASIN OR (CUSTOM PROCEDURE TRAY) ×4 IMPLANT
KIT CLEAN ENDO COMPLIANCE (KITS) ×4 IMPLANT
KIT TURNOVER KIT B (KITS) ×4 IMPLANT
MARKER SKIN DUAL TIP RULER LAB (MISCELLANEOUS) IMPLANT
NEEDLE SPNL 18GX3.5 QUINCKE PK (NEEDLE) IMPLANT
NS IRRIG 1000ML POUR BTL (IV SOLUTION) ×4 IMPLANT
OBTURATOR OPTICAL STANDARD 8MM (TROCAR)
OBTURATOR OPTICAL STND 8 DVNC (TROCAR)
OBTURATOR OPTICALSTD 8 DVNC (TROCAR) IMPLANT
OIL SILICONE PENTAX (PARTS (SERVICE/REPAIRS)) ×4 IMPLANT
PACK CHEST (CUSTOM PROCEDURE TRAY) ×4 IMPLANT
PAD ARMBOARD 7.5X6 YLW CONV (MISCELLANEOUS) ×20 IMPLANT
PASSER SUT SWANSON 36MM LOOP (INSTRUMENTS) IMPLANT
PENCIL SMOKE EVACUATOR (MISCELLANEOUS) ×4 IMPLANT
PORT ACCESS TROCAR AIRSEAL 12 (TROCAR) ×2 IMPLANT
PORT ACCESS TROCAR AIRSEAL 5M (TROCAR) ×2
POUCH RETRIEVAL ECOSAC 10 (ENDOMECHANICALS) IMPLANT
POUCH RETRIEVAL ECOSAC 10MM (ENDOMECHANICALS)
RELOAD STAPLER 2.5X45 WHT DVNC (STAPLE) ×6 IMPLANT
RELOAD STAPLER 3.5X45 BLU DVNC (STAPLE) ×14 IMPLANT
RELOAD STAPLER 4.3X45 GRN DVNC (STAPLE) ×2 IMPLANT
SEAL CANN UNIV 5-8 DVNC XI (MISCELLANEOUS) ×2 IMPLANT
SEAL XI 5MM-8MM UNIVERSAL (MISCELLANEOUS) ×2
SEALANT PROGEL (MISCELLANEOUS) IMPLANT
SEALANT SURG COSEAL 4ML (VASCULAR PRODUCTS) IMPLANT
SEALANT SURG COSEAL 8ML (VASCULAR PRODUCTS) IMPLANT
SEALER LIGASURE MARYLAND 30 (ELECTROSURGICAL) IMPLANT
SET TRI-LUMEN FLTR TB AIRSEAL (TUBING) ×4 IMPLANT
SLEEVE SUCTION 125 (MISCELLANEOUS) ×4 IMPLANT
SOLUTION ELECTROLUBE (MISCELLANEOUS) IMPLANT
STAPLER 45 DA VINCI SURE FORM (STAPLE) ×2
STAPLER 45 SUREFORM CVD (STAPLE) ×2
STAPLER 45 SUREFORM CVD DVNC (STAPLE) ×2 IMPLANT
STAPLER 45 SUREFORM DVNC (STAPLE) ×2 IMPLANT
STAPLER CANNULA SEAL DVNC XI (STAPLE) ×4 IMPLANT
STAPLER CANNULA SEAL XI (STAPLE) ×4
STAPLER RELOAD 2.5X45 WHITE (STAPLE) ×6
STAPLER RELOAD 2.5X45 WHT DVNC (STAPLE) ×6
STAPLER RELOAD 3.5X45 BLU DVNC (STAPLE) ×14
STAPLER RELOAD 3.5X45 BLUE (STAPLE) ×14
STAPLER RELOAD 4.3X45 GREEN (STAPLE) ×2
STAPLER RELOAD 4.3X45 GRN DVNC (STAPLE) ×2
STOPCOCK 4 WAY LG BORE MALE ST (IV SETS) ×4 IMPLANT
SUT PROLENE 3 0 SH DA (SUTURE) IMPLANT
SUT PROLENE 4 0 RB 1 (SUTURE)
SUT PROLENE 4-0 RB1 .5 CRCL 36 (SUTURE) IMPLANT
SUT SILK  1 MH (SUTURE) ×2
SUT SILK 1 MH (SUTURE) ×2 IMPLANT
SUT SILK 1 TIES 10X30 (SUTURE) IMPLANT
SUT SILK 2 0SH CR/8 30 (SUTURE) IMPLANT
SUT VIC AB 1 CTX 18 (SUTURE) IMPLANT
SUT VIC AB 1 CTX 36 (SUTURE)
SUT VIC AB 1 CTX36XBRD ANBCTR (SUTURE) IMPLANT
SUT VIC AB 2-0 CT1 18 (SUTURE) ×4 IMPLANT
SUT VIC AB 2-0 CTX 36 (SUTURE) IMPLANT
SUT VIC AB 3-0 X1 27 (SUTURE) ×12 IMPLANT
SUT VICRYL 0 TIES 12 18 (SUTURE) ×4 IMPLANT
SUT VICRYL 0 UR6 27IN ABS (SUTURE) ×8 IMPLANT
SUT VICRYL 2 TP 1 (SUTURE) IMPLANT
SYR 10ML LL (SYRINGE) ×4 IMPLANT
SYR 20ML ECCENTRIC (SYRINGE) ×4 IMPLANT
SYR 20ML LL LF (SYRINGE) ×4 IMPLANT
SYR 50ML LL SCALE MARK (SYRINGE) ×4 IMPLANT
SYSTEM RETRIEVAL ANCHOR 12 (MISCELLANEOUS) ×4 IMPLANT
SYSTEM SAHARA CHEST DRAIN ATS (WOUND CARE) ×4 IMPLANT
TAPE CLOTH 4X10 WHT NS (GAUZE/BANDAGES/DRESSINGS) ×4 IMPLANT
TAPE CLOTH SURG 4X10 WHT LF (GAUZE/BANDAGES/DRESSINGS) ×4 IMPLANT
TAPE UMBILICAL COTTON 1/8X30 (MISCELLANEOUS) IMPLANT
TIP APPLICATOR SPRAY EXTEND 16 (VASCULAR PRODUCTS) IMPLANT
TOWEL GREEN STERILE (TOWEL DISPOSABLE) ×4 IMPLANT
TOWEL GREEN STERILE FF (TOWEL DISPOSABLE) ×4 IMPLANT
TRAP FLUID SMOKE EVACUATOR (MISCELLANEOUS) ×4 IMPLANT
TRAP SPECIMEN MUCUS 40CC (MISCELLANEOUS) IMPLANT
TRAY FOLEY MTR SLVR 16FR STAT (SET/KITS/TRAYS/PACK) ×4 IMPLANT
TROCAR XCEL BLADELESS 5X75MML (TROCAR) ×4 IMPLANT
TUBE CONNECTING 20'X1/4 (TUBING) ×1
TUBE CONNECTING 20X1/4 (TUBING) ×3 IMPLANT
TUBING EXTENTION W/L.L. (IV SETS) ×4 IMPLANT
VALVE BIOPSY  SINGLE USE (MISCELLANEOUS) ×2
VALVE BIOPSY SINGLE USE (MISCELLANEOUS) ×2 IMPLANT
VALVE SUCTION BRONCHIO DISP (MISCELLANEOUS) ×4 IMPLANT
WATER STERILE IRR 1000ML POUR (IV SOLUTION) ×4 IMPLANT

## 2020-03-29 NOTE — Plan of Care (Signed)
Initiate Care Plan Problem: Education: Goal: Knowledge of General Education information will improve Description: Including pain rating scale, medication(s)/side effects and non-pharmacologic comfort measures Outcome: Progressing   Problem: Health Behavior/Discharge Planning: Goal: Ability to manage health-related needs will improve Outcome: Progressing   Problem: Clinical Measurements: Goal: Ability to maintain clinical measurements within normal limits will improve Outcome: Progressing Goal: Will remain free from infection Outcome: Progressing Goal: Diagnostic test results will improve Outcome: Progressing Goal: Respiratory complications will improve Outcome: Progressing Goal: Cardiovascular complication will be avoided Outcome: Progressing   Problem: Activity: Goal: Risk for activity intolerance will decrease Outcome: Progressing   Problem: Nutrition: Goal: Adequate nutrition will be maintained Outcome: Progressing   Problem: Coping: Goal: Level of anxiety will decrease Outcome: Progressing   Problem: Elimination: Goal: Will not experience complications related to bowel motility Outcome: Progressing Goal: Will not experience complications related to urinary retention Outcome: Progressing   Problem: Pain Managment: Goal: General experience of comfort will improve Outcome: Progressing   Problem: Safety: Goal: Ability to remain free from injury will improve Outcome: Progressing   Problem: Skin Integrity: Goal: Risk for impaired skin integrity will decrease Outcome: Progressing   Problem: Education: Goal: Knowledge of disease or condition will improve Outcome: Progressing Goal: Knowledge of the prescribed therapeutic regimen will improve Outcome: Progressing   Problem: Activity: Goal: Risk for activity intolerance will decrease Outcome: Progressing   Problem: Activity: Goal: Risk for activity intolerance will decrease Outcome: Progressing   Problem:  Cardiac: Goal: Will achieve and/or maintain hemodynamic stability Outcome: Progressing   Problem: Clinical Measurements: Goal: Postoperative complications will be avoided or minimized Outcome: Progressing   Problem: Respiratory: Goal: Respiratory status will improve Outcome: Progressing   Problem: Pain Management: Goal: Pain level will decrease Outcome: Progressing   Problem: Skin Integrity: Goal: Wound healing without signs and symptoms infection will improve Outcome: Progressing

## 2020-03-29 NOTE — Anesthesia Procedure Notes (Addendum)
Procedure Name: Intubation Date/Time: 03/29/2020 7:44 AM Performed by: Trinna Post., CRNA Pre-anesthesia Checklist: Patient identified, Emergency Drugs available, Suction available, Patient being monitored and Timeout performed Patient Re-evaluated:Patient Re-evaluated prior to induction Oxygen Delivery Method: Circle system utilized Preoxygenation: Pre-oxygenation with 100% oxygen Induction Type: IV induction Ventilation: Mask ventilation without difficulty Laryngoscope Size: Mac and 3 Grade View: Grade I Tube type: Oral Tube size: 8.5 mm Number of attempts: 1 Airway Equipment and Method: Stylet Placement Confirmation: ETT inserted through vocal cords under direct vision,  positive ETCO2 and breath sounds checked- equal and bilateral Secured at: 22 cm Tube secured with: Tape Dental Injury: Teeth and Oropharynx as per pre-operative assessment

## 2020-03-29 NOTE — Discharge Instructions (Signed)
Robot-Assisted Thoracic Surgery    Robot-assisted thoracic surgery is a procedure in which robotic arms and surgical instruments are used to perform complex procedures through small incisions. During surgery, the surgeon sits at a console inside of the operating room and uses controls at the console to move the robotic arms. Surgical instruments are attached to the ends of the robotic arms. Instruments include a tool with a light and camera on the end of it (thoracoscope). The camera sends images to a video monitor that your surgeon will use to view the inside of your thoracic area. The thoracic area is between the neck and abdomen. Robot-assisted thoracic surgery may be done to:  Remove a tissue sample to be tested in a lab (biopsy).  Remove a part of a lung (lobectomy) or the whole lung (pneumonectomy).  Remove tumors.  Treat other conditions that affect: ? Your esophagus. This is the part of your body that carries food and liquid from your mouth to your stomach. ? Your diaphragm. This is a muscle wall between your lungs and stomach area. It helps with breathing.  Remove a portion of the nerves (sympathectomy) that cause excessive sweating. Tell a health care provider about:  Any allergies you have. This is especially important if you have allergies to medicines or sedatives.  All medicines you are taking, including vitamins, herbs, eye drops, creams, and over-the-counter medicines.  Any problems you or family members have had with anesthetic medicines.  Any blood disorders you have.  Any surgeries you have had.  Any medical conditions you have.  Whether you are pregnant or may be pregnant. What are the risks? Generally, this is a safe procedure. However, problems may occur, including:  Infection, such as pneumonia.  Severe bleeding (hemorrhage).  Allergic reactions to medicines.  Damage to organs or structures such as nerves or blood vessels. What happens before the  procedure? Staying hydrated Follow instructions from your health care provider about hydration, which may include:  Up to 2 hours before the procedure - you may continue to drink clear liquids, such as water, clear fruit juice, black coffee, and plain tea. Eating and drinking restrictions Follow instructions from your health care provider about eating and drinking, which may include:  8 hours before the procedure - stop eating heavy meals or foods, such as meat, fried foods, or fatty foods.  6 hours before the procedure - stop eating light meals or foods, such as toast or cereal.  6 hours before the procedure - stop drinking milk or drinks that contain milk.  2 hours before the procedure - stop drinking clear liquids. Medicines  Ask your health care provider about: ? Changing or stopping your regular medicines. This is especially important if you are taking diabetes medicines or blood thinners. ? Taking medicines such as aspirin and ibuprofen. These medicines can thin your blood. Do not take these medicines unless your health care provider tells you to take them. ? Taking over-the-counter medicines, vitamins, herbs, and supplements.  Talk with your health care provider about safe and effective ways to manage pain before and after your procedure. Pain management should fit your specific health needs. Tests  You may have tests, such as: ? CT scan. ? Ultrasound. ? Chest X-ray. ? Electrocardiogram (ECG).  You may have a blood or urine sample taken. General instructions  Ask your health care provider: ? How your surgery site will be marked. ? What steps will be taken to help prevent infection. These steps may include:  Removing hair at the surgery site.  Washing skin with a germ-killing soap.  Receiving antibiotic medicine.  Do not use any products that contain nicotine or tobacco for at least 4 weeks before the procedure. These products include cigarettes, chewing tobacco, and  vaping devices, such as e-cigarettes. If you need help quitting, ask your health care provider.  Plan to have a responsible adult take you home from the hospital or clinic.  Plan to have a responsible adult care for you for the time you are told after you leave the hospital or clinic. This is important. What happens during the procedure?  An IV will be inserted into one of your veins.  You will be given one or more of the following: ? A medicine to help you relax (sedative). ? A medicine to make you fall asleep (general anesthetic). ? A medicine that is injected into an area of your body to numb everything below the injection site (regional anesthetic).  One to four small incisions will be made. The number of incisions and the area where incisions are made will depend on the purpose of your procedure.  A surgical assistant will then place instruments that are attached to the robotic arms into your thoracic cavity through these small incisions. The robotic arms are equipped with different surgical instruments and a high-definition 3D camera.  The surgeon will sit at a control console and see a magnified, high-resolution 3D image of the surgical field on a monitor. The surgeon will use master controls from the console that respond to the surgeon's movements in real time.  The computer-assisted robot will translate the surgeon's hand, wrist, or finger movements into precise movements of the four robotic arms so that the necessary procedures can be performed.  A drainage tube may be placed to drain excess fluid from the chest cavity.  Your incisions will be closed with stitches (sutures) or staples and covered with a bandage (dressing). The procedure may vary among health care providers and hospitals. What happens after the procedure?  Your blood pressure, heart rate, breathing rate, and blood oxygen level will be monitored until you leave the hospital or clinic.  You may have a drainage  tube in place. It may stay in place for a few days after the procedure to monitor for signs of air or fluid buildup in the chest cavity. Summary  Robot-assisted thoracic surgery is a procedure in which robotic arms and surgical instruments are used to help perform complex procedures through small incisions. During surgery, the surgeon sits at a console in the operating room and uses controls at the console to move the robotic arms.  A drainage tube may be placed to drain excess fluid from the chest cavity. The tube will be closely monitored for fluid or air buildup in the chest cavity. This information is not intended to replace advice given to you by your health care provider. Make sure you discuss any questions you have with your health care provider. Document Revised: 11/03/2019 Document Reviewed: 11/03/2019 Elsevier Patient Education  2021 [employer].

## 2020-03-29 NOTE — Hospital Course (Addendum)
      [address]       [place] [address]             [phone]        HPI:   [name] 70 y.o. female is seen in the office  today for follow evaluation   of abnormal CT scan of the chest done on 01/28/2020.  She returns today with PFT's and PET scan     Patient notes in December 2021 she had couple weeks of cough and sputum production, this led to a chest x-ray and ultimately ultimately a CT scan of the chest.  Chest x-ray showed mass  right lung field, per reports this would been noted on CT scan of the chest in July, has been recommended the patient have a follow-up CT scan in 6 months.   Patient is a long-term smoker quitting approximately 3 years ago-   Patient has not been vaccinated for Covid-she was encouraged to do this, since she was last seen still has not proceed with covid immunization    Patient notes previous cardica workup for murmur - she has no history of MI or stent placement - she has seen cardioogy had has preop clearence - stress test done.   After encouraging patient when first seen to get covid vaccination  when first seen , she finally did - one week later developed + covid - COVID-19 on 1/19, symptoms were mild, was treated outpatient with Sotrovimab infusion on 1/21.  Seen by PCP 03/23/2020, noted to be feeling much better, had some mild congestion and cough, treated with azithromycin    Patient is now back to baseline with respiratory status.    Hospital Course:   [name] underwent a right robotic thoracoscopy middle lobectomy, lymph node dissection, and intercostal nerve block with Dr. Servando Snare on 03/29/2020. She tolerated the procedure well and was transferred to the step down unit for continued care. Her chest tube was discontinued POD 2 without issue. Her hemoglobin remained stable. We continued her iron supplementation. She remains on 2L [place] and we are weaning as tolerated. POD 3 she remained stable. She had some nausea the previous  day so that set her back some from using her IS and walking around the unit. She was feeling a little better and started doing more for herself. Her CXR showed a slight increase in pneumothorax. She will likely always have a space due to her lung resection. We continued to encourage the use of incentive spirometer and ambulation around the unit. Today, she is ambulating with limited assistance, tolerating room air, her incisions are all healing well, and she is ready for discharge home.

## 2020-03-29 NOTE — Anesthesia Postprocedure Evaluation (Signed)
Anesthesia Post Note  Patient: [name]  Procedure(s) Performed: VIDEO BRONCHOSCOPY AND VIDEO ASSISTED XI ROBOTIC THORASCOPY - RIGHT MIDDLE LOBE LUNG LOBECTOMY, WITH INTERCOSTAL NERVE BLOCK AND LYMPH NODE DISSECTION (Right Chest)     Patient location during evaluation: PACU Anesthesia Type: General Level of consciousness: awake and alert Pain management: pain level controlled Vital Signs Assessment: post-procedure vital signs reviewed and stable Respiratory status: spontaneous breathing, nonlabored ventilation, respiratory function stable and patient connected to nasal cannula oxygen Cardiovascular status: blood pressure returned to baseline and stable Postop Assessment: no apparent nausea or vomiting Anesthetic complications: no   No complications documented.  Last Vitals:  Vitals:   03/29/20 1455 03/29/20 1510  BP: (!) 152/79 (!) 154/79  Pulse: 71 69  Resp: 14 13  Temp:    SpO2: 98% 99%    Last Pain:  Vitals:   03/29/20 1510  TempSrc:   PainSc: Asleep                 Catalina Gravel

## 2020-03-29 NOTE — Transfer of Care (Signed)
Immediate Anesthesia Transfer of Care Note  Patient: [name]  Procedure(s) Performed: VIDEO BRONCHOSCOPY AND VIDEO ASSISTED XI ROBOTIC THORASCOPY - RIGHT MIDDLE LOBE LUNG LOBECTOMY, WITH INTERCOSTAL NERVE BLOCK AND LYMPH NODE DISSECTION (Right Chest)  Patient Location: PACU  Anesthesia Type:General  Level of Consciousness: drowsy  Airway & Oxygen Therapy: Patient Spontanous Breathing and Patient connected to face mask oxygen  Post-op Assessment: Report given to RN and Post -op Vital signs reviewed and stable  Post vital signs: Reviewed and stable  Last Vitals:  Vitals Value Taken Time  BP 143/72 03/29/20 1340  Temp    Pulse 80 03/29/20 1348  Resp 18 03/29/20 1348  SpO2 100 % 03/29/20 1348  Vitals shown include unvalidated device data.  Last Pain:  Vitals:   03/29/20 1340  TempSrc:   PainSc: Asleep         Complications: No complications documented.

## 2020-03-29 NOTE — Anesthesia Procedure Notes (Signed)
Arterial Line Insertion Start/End2/08/2020 7:20 AM, 03/29/2020 7:25 AM Performed by: Catalina Gravel, MD, anesthesiologist  Patient location: Pre-op. Preanesthetic checklist: patient identified, IV checked, site marked, risks and benefits discussed, surgical consent, monitors and equipment checked, pre-op evaluation, timeout performed and anesthesia consent Lidocaine 1% used for infiltration Left, radial was placed Catheter size: 20 Fr Hand hygiene performed  and maximum sterile barriers used   Attempts: 1 Procedure performed without using ultrasound guided technique. Following insertion, dressing applied and Biopatch. Post procedure assessment: normal and unchanged  Patient tolerated the procedure well with no immediate complications.

## 2020-03-29 NOTE — Anesthesia Procedure Notes (Signed)
Procedure Name: Intubation Date/Time: 03/29/2020 7:53 AM Performed by: Trinna Post., CRNA Pre-anesthesia Checklist: Patient identified, Emergency Drugs available, Suction available, Patient being monitored and Timeout performed Patient Re-evaluated:Patient Re-evaluated prior to induction Oxygen Delivery Method: Circle system utilized Preoxygenation: Pre-oxygenation with 100% oxygen Induction Type: IV induction Ventilation: Mask ventilation without difficulty Laryngoscope Size: Mac and 3 Grade View: Grade I Endobronchial tube: Left, EBT position confirmed by auscultation, EBT position confirmed by fiberoptic bronchoscope and Double lumen EBT and 37 Fr Number of attempts: 1 Airway Equipment and Method: Rigid stylet and Video-laryngoscopy Placement Confirmation: ETT inserted through vocal cords under direct vision,  positive ETCO2 and breath sounds checked- equal and bilateral Tube secured with: Tape Dental Injury: Teeth and Oropharynx as per pre-operative assessment

## 2020-03-29 NOTE — H&P (Signed)
[address]       [place] [address]             [phone]                    [name] Date of Birth: 1950/03/04  Referring: Marge Duncans, PA-C Primary Care: Marge Duncans, PA-C Primary Cardiologist: No primary care provider on file.  Chief Complaint:    Chief Complaint  Patient presents with  . Lung Mass    F/u to discuss PET Scan and PFT results    History of Present Illness:    [name] 70 y.o. female is seen in the office  today for follow evaluation   of abnormal CT scan of the chest done on 01/28/2020.  She returns today with PFT's and PET scan    Patient notes in December 2021 she had couple weeks of cough and sputum production, this led to a chest x-ray and ultimately ultimately a CT scan of the chest.  Chest x-ray showed mass  right lung field, per reports this would been noted on CT scan of the chest in July, has been recommended the patient have a follow-up CT scan in 6 months.  Patient is a long-term smoker quitting approximately 3 years ago-  Patient has not been vaccinated for Covid-she was encouraged to do this, since she was last seen still has not proceed with covid immunization   Patient notes previous cardica workup for murmur - she has no history of MI or stent placement - she has seen cardioogy had has preop clearence - stress test done.  After encouraging patient when first seen to get covid vaccination  when first seen , she finally did - one week later developed + covid - COVID-19 on 1/19, symptoms were mild, was treated outpatient with Sotrovimab infusion on 1/21.  Seen by PCP 03/23/2020, noted to be feeling much better, had some mild congestion and cough, treated with azithromycin    Patient is now back to baseline with respiratory status    Current Activity/ Functional Status:  Patient is independent with mobility/ambulation, transfers, ADL's, IADL's.   Zubrod Score: At the time of  surgery this patient's most appropriate activity status/level should be described as: []     0    Normal activity, no symptoms [x]     1    Restricted in physical strenuous activity but ambulatory, able to do out light work []     2    Ambulatory and capable of self care, unable to do work activities, up and about               >50 % of waking hours                              []     3    Only limited self care, in bed greater than 50% of waking hours []     4    Completely disabled, no self care, confined to bed or chair []     5    Moribund   Past Medical History:  Diagnosis Date  . Abnormal chest x-ray 01/19/2020  . Acute laryngopharyngitis 03/18/2020  . Aortic atherosclerosis ([place]) 01/28/2020  . Arthritis   . Cardiac murmur 03/01/2020  . COPD (chronic obstructive pulmonary disease) ([place])   . Coronary artery calcification seen on CT scan 03/01/2020  .  Cough 01/12/2020  . COVID-19 03/10/2020  . Dyspnea    on exertion,after getting covid  . Family history of adverse reaction to anesthesia    brothr had n/v  . GERD (gastroesophageal reflux disease)   . Hiatal hernia   . Hyperlipemia   . Pneumonia   . Pre-operative cardiovascular examination 03/01/2020  . 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 Visit for screening mammogram 01/28/2020    No past surgical history on file.  Family History  Problem Relation Age of Onset  . Heart disease Mother   . Heart attack Mother   . Cancer Father   . Diabetes Father   . Non-Hodgkin's lymphoma Brother   . High Cholesterol Brother   . High Cholesterol Brother   . Heart disease Brother   . Heart attack Brother      Social History   Tobacco Use  Smoking Status Former Smoker  . Years: 50.00  . Types: Cigarettes  . Quit date: 03/22/2017  . Years since quitting: 3.0  Smokeless Tobacco Never Used    Social History   Substance and Sexual Activity  Alcohol Use Not Currently      Allergies  Allergen Reactions  . Codeine Hives  . Surgical Lubricant Hives    Current Facility-Administered Medications  Medication Dose Route Frequency Provider Last Rate Last Admin  . ceFAZolin (ANCEF) IVPB 2g/100 mL premix  2 g Intravenous 30 min Pre-Op Grace Isaac, MD       Facility-Administered Medications Ordered in Other Encounters  Medication Dose Route Frequency Provider Last Rate Last Admin  . lactated ringers infusion   Intravenous Continuous PRN Trinna Post., CRNA   New Bag at 03/29/20 [phone]    Pertinent items are noted in HPI.   Review of Systems:     Cardiac Review of Systems: [Y] = yes  or   [ N ] = no   Chest Pain Florencio.Farrier    ]  Resting SOB [  n ] Exertional SOB  [ y ]  Orthopnea [ n ]   Pedal Edema [ n  ]    Palpitations [ n ] Syncope  [ n ]   Presyncope [   ]   General Review of Systems: [Y] = yes [  ]=no Constitional: recent weight change [  ];  Wt loss over the last 3 months [   ] anorexia [  ]; fatigue [  ]; nausea [  ]; night sweats [  ]; fever [  ]; or chills [  ];           Eye : blurred vision [  ]; diplopia [   ]; vision changes [  ];  Amaurosis fugax[  ]; Resp: cough [  ];  wheezing[  ];  hemoptysis[  ]; shortness of breath[  ]; paroxysmal nocturnal dyspnea[  ]; dyspnea on exertion[  ]; or orthopnea[  ];  GI:  gallstones[  ], vomiting[  ];  dysphagia[  ]; melena[  ];  hematochezia [  ]; heartburn[  ];   Hx of  Colonoscopy[  ]; GU: kidney stones [  ]; hematuria[  ];   dysuria [  ];  nocturia[  ];  history of     obstruction [  ]; urinary frequency [  ]             Skin: rash, swelling[  ];, hair loss[  ];  peripheral edema[  ];  or itching[  ]; Musculosketetal: myalgias[  ];  joint swelling[  ];  joint erythema[  ];  joint pain[  ];  back pain[  ];  Heme/Lymph: bruising[  ];  bleeding[  ];  anemia[  ];  Neuro: TIA[  ];  headaches[  ];  stroke[  ];  vertigo[  ];  seizures[  ];   paresthesias[  ];  difficulty walking[  ];  Psych:depression[  ];  anxiety[  ];  Endocrine: diabetes[  ];  thyroid dysfunction[  ];  Immunizations: Flu up to date Blue.Reese ]; Pneumococcal up to date [ y ]; COVID-52 vacination completed  [ n  ]  Other:     PHYSICAL EXAMINATION: BP 102/80   Pulse 81   Temp 98.1 F (36.7 C) (Oral)   Resp 18   Ht 5\' 7"  (1.702 m)   Wt 67.9 kg   SpO2 100%   BMI 23.45 kg/m  General appearance: alert and cooperative Neck: no adenopathy, no carotid bruit, no JVD, supple, symmetrical, trachea midline and thyroid not enlarged, symmetric, no tenderness/mass/nodules Resp: clear to auscultation bilaterally Cardio: regular rate and rhythm, S1, S2 normal, no murmur, click, rub or gallop GI: soft, non-tender; bowel sounds normal; no masses,  no organomegaly Extremities: extremities normal, atraumatic, no cyanosis or edema Neurologic: Grossly normal  Diagnostic Studies & Laboratory data:     Recent Radiology Findings:   DG Chest 2 View  Result Date: 03/29/2020 CLINICAL DATA:  70 year old female preoperative study for right lung lesion. EXAM: CHEST - 2 VIEW COMPARISON:  CT 01/23/2020.  Radiographs 01/13/2020. FINDINGS: Lung volumes and mediastinal contours remain stable. Patchy and spiculated opacity in the right mid lung is stable to slightly regressed since November. No superimposed pneumothorax, pulmonary edema, pleural effusion. Underlying emphysema as demonstrated by CT. No acute osseous abnormality identified. Negative visible bowel gas pattern. IMPRESSION: Emphysema ([id number]) with known right mid lung lesion. No new acardiopulmonary abnormality. Electronically Signed   By: Genevie Ann M.D.   On: 03/29/2020 06:21   MYOCARDIAL PERFUSION IMAGING  Result Date: 03/05/2020  The left ventricular ejection fraction is hyperdynamic (>65%).  Nuclear stress EF: 76%.  There was no ST segment deviation noted during stress.  The study is normal.  This is a low risk study.    ECHOCARDIOGRAM COMPLETE  Result Date: 03/03/2020     ECHOCARDIOGRAM REPORT   Patient Name:   LORAL CAMPI Date of Exam: 03/03/2020 Medical Rec #:  [id number]   Height:       67.0 in Accession #:    [id number]  Weight:       153.8 lb Date of Birth:  Dec 02, 1950   BSA:          1.809 m Patient Age:    89 years    BP:           144/82 mmHg Patient Gender: F           HR:           68 bpm. Exam Location:  [facility] Procedure: 2D Echo, 3D Echo, Cardiac Doppler and Color Doppler Indications:    Z01.818 Encounter for other preprocedural examination; I25.110                 Atherosclerotic heart disease of native coronary artery with                 unstable angina pectoris; R01.1 Murmur  History:  Patient has no prior history of Echocardiogram examinations.                 CAD; Risk Factors:Dyslipidemia and Former Smoker. Preop Eval for                 Lung surgery.  Sonographer:    Geradine Girt Referring Phys: Waverly [facility] IMPRESSIONS  1. Left ventricular ejection fraction, by estimation, is 60 to 65%. The left ventricle has normal function. The left ventricle has no regional wall motion abnormalities. Left ventricular diastolic parameters are consistent with Grade I diastolic dysfunction (impaired relaxation).  2. Right ventricular systolic function is normal. The right ventricular size is normal. There is normal pulmonary artery systolic pressure.  3. The mitral valve is normal in structure. Mild mitral valve regurgitation. No evidence of mitral stenosis.  4. The aortic valve is normal in structure. Aortic valve regurgitation is not visualized. No aortic stenosis is present.  5. The inferior vena cava is normal in size with greater than 50% respiratory variability, suggesting right atrial pressure of 3 mmHg. FINDINGS  Left Ventricle: Left ventricular ejection fraction, by estimation, is 60 to 65%. The left ventricle has normal function. The left ventricle has no regional wall motion abnormalities. The left ventricular internal cavity size was normal in size.  There is  no left ventricular hypertrophy. Left ventricular diastolic parameters are consistent with Grade I diastolic dysfunction (impaired relaxation). Right Ventricle: The right ventricular size is normal. No increase in right ventricular wall thickness. Right ventricular systolic function is normal. There is normal pulmonary artery systolic pressure. The tricuspid regurgitant velocity is 2.39 m/s, and  with an assumed right atrial pressure of 3 mmHg, the estimated right ventricular systolic pressure is 82.5 mmHg. Left Atrium: Left atrial size was normal in size. Right Atrium: Right atrial size was normal in size. Pericardium: There is no evidence of pericardial effusion. Mitral Valve: The mitral valve is normal in structure. Mild mitral valve regurgitation. No evidence of mitral valve stenosis. Tricuspid Valve: The tricuspid valve is normal in structure. Tricuspid valve regurgitation is mild . No evidence of tricuspid stenosis. Aortic Valve: The aortic valve is normal in structure. Aortic valve regurgitation is not visualized. No aortic stenosis is present. Pulmonic Valve: The pulmonic valve was normal in structure. Pulmonic valve regurgitation is not visualized. No evidence of pulmonic stenosis. Aorta: The aortic root is normal in size and structure. Venous: The inferior vena cava is normal in size with greater than 50% respiratory variability, suggesting right atrial pressure of 3 mmHg. IAS/Shunts: No atrial level shunt detected by color flow Doppler.  LEFT VENTRICLE PLAX 2D LVIDd:         3.86 cm  Diastology LVIDs:         2.79 cm  LV e' medial:    7.72 cm/s LV PW:         0.83 cm  LV E/e' medial:  8.9 LV IVS:        1.05 cm  LV e' lateral:   6.20 cm/s LVOT diam:     1.90 cm  LV E/e' lateral: 11.1 LV SV:         47 LV SV Index:   26 LVOT Area:     2.84 cm                          3D Volume EF:  3D EF:        63 %                         LV EDV:       97 ml                         LV  ESV:       36 ml                         LV SV:        61 ml RIGHT VENTRICLE RV S prime:     11.20 cm/s TAPSE (M-mode): 2.0 cm LEFT ATRIUM             Index      RIGHT ATRIUM          Index LA diam:        3.10 cm 1.71 cm/m RA Area:     9.61 cm LA Vol (A2C):   17.6 ml 9.73 ml/m RA Volume:   18.30 ml 10.12 ml/m LA Vol (A4C):   16.5 ml 9.12 ml/m LA Biplane Vol: 17.9 ml 9.90 ml/m  AORTIC VALVE LVOT Vmax:   66.60 cm/s LVOT Vmean:  44.400 cm/s LVOT VTI:    0.166 m  AORTA Ao Root diam: 2.70 cm Ao Asc diam:  2.70 cm MITRAL VALVE               TRICUSPID VALVE MV Area (PHT): 2.66 cm    TR Peak grad:   22.8 mmHg MV Decel Time: 285 msec    TR Vmax:        239.00 cm/s MV E velocity: 68.60 cm/s MV A velocity: 85.70 cm/s  SHUNTS MV E/A ratio:  0.80        Systemic VTI:  0.17 m                            Systemic Diam: 1.90 cm Jenne Campus MD Electronically signed by Jenne Campus MD Signature Date/Time: 03/03/2020/7:25:26 PM    Final    CLINICAL DATA:  Initial treatment strategy for right middle lobe nodule/mass.  EXAM: NUCLEAR MEDICINE PET SKULL BASE TO THIGH  TECHNIQUE: 8.1 mCi F-18 FDG was injected intravenously. Full-ring PET imaging was performed from the skull base to thigh after the radiotracer. CT data was obtained and used for attenuation correction and anatomic localization.  Fasting blood glucose: 93 mg/dl  COMPARISON:  Chest CT 01/23/2020  FINDINGS: Mediastinal blood pool activity: SUV max 2.2  Liver activity: SUV max NA  NECK: No significant abnormal hypermetabolic activity in this region.  Incidental CT findings: Bilateral common carotid atherosclerotic calcification.  CHEST: The solid component of the right middle lobe mass has a maximum SUV of 6.2, compatible with malignancy. This solid component measures about 2.2 by 1.2 cm although there are sub solid components as well as interstitial thickening surrounding cystic airspaces associated with this lesion, in  total measuring about 4.4 by 3.2 cm on image 41 of series 8. No pathologically enlarged or hypermetabolic adenopathy is identified in the thorax.  Incidental CT findings: Centrilobular emphysema. Coronary, aortic arch, and branch vessel atherosclerotic vascular disease. 0.6 by 0.4 cm right middle lobe medial pulmonary nodule on image 47 of series 8, essentially stable from 03/28/2007, considered benign. 0.3 by 0.2 cm left upper lobe  nodule along the major fissure on image 18 of series 8 is technically nonspecific due to small size.  ABDOMEN/PELVIS: No significant abnormal hypermetabolic activity in this region.  Incidental CT findings: Diffuse hepatic steatosis. Aortoiliac atherosclerotic vascular disease.  SKELETON: No significant abnormal hypermetabolic activity in this region.  Incidental CT findings: Chronic lucency in the right iliac bone on image 153 of series 4 common no change from 05/17/2007, considered benign and incidental.  IMPRESSION: 1. The solid component of right middle lobe mass has a maximum SUV of 6.2, compatible with malignancy. No hypermetabolic adenopathy or other compelling findings of metastatic spread. 2. Aortic Atherosclerosis (ICD10-I70.0) and Emphysema (ICD10-J43.9). Coronary atherosclerosis. 3. Diffuse hepatic steatosis.   Electronically Signed   By: Van Clines M.D.   On: 02/17/2020 15:31   I have independently reviewed the above radiology studies  and reviewed the findings with the patient.   Patient has CT scans from February 20, 2018 September 10, 2018 and January 23, 2020 but demonstrate a partial solid cystic mass in the right middle lobe that has been increasing in size over the past 2 years also has noted extensive coronary calcification and 50 to 60% left subclavian stenosis These findings are listed in the radiology system under Terance Hart same birth date.   PFT's   FEV1 1.8 77% DLCO 16.3 81%   Recent Lab  Findings: Lab Results  Component Value Date   WBC 7.7 03/25/2020   HGB 13.3 03/25/2020   HCT 39.7 03/25/2020   PLT 332 03/25/2020   GLUCOSE 89 03/25/2020   ALT 25 03/25/2020   AST 29 03/25/2020   NA 135 03/25/2020   K 4.5 03/25/2020   CL 101 03/25/2020   CREATININE 0.99 03/25/2020   BUN 9 03/25/2020   CO2 21 (L) 03/25/2020   INR 1.0 03/25/2020      Assessment / Plan:   #1 part cystic solid enlarging mass right middle lobe-now on PET scan hypermetabolic-solid component measures 2.2 x 1.2 cm surrounding cystic airspace total measures 4.4 x 3.2-there is no evidence of hypermetabolic adenopathy or other findings consistent with metastatic spread-if malignant clinical stage I disease #2 long-term smoking history with significant peripheral vascular disease with left subclavian stenosis 50 to 60% and extensive calcification of her coronary system on standard CT of the chest-patient has no previous history of coronary interventions.  She does give a history of previous cardiac evaluation and was told she had a heart murmur-we have no more details on this-because of the degree of calcification of her coronary arteries, with peripheral vascular disease, long-term smoking history we asked  for cardiac evaluation prior to consideration of surgical lung resection- this has been done with nuclear stress test and echocardiogram     I discussed with her and her grand daughter and reviewed the films that most likely these findings suggest progressive enlargement of primary lung cancer-now with a solid component that is significantly hypermetabolic.  I recommended that we proceed with robotic assisted surgical resection.     The goals risks and alternatives of the planned surgical procedure Procedure(s): XI ROBOTIC ASSISTED THORASCOPY-LUNG RESECTION (Right) VIDEO BRONCHOSCOPY (N/A)  have been discussed with the patient in detail. The risks of the procedure including death, infection, stroke,  myocardial infarction, bleeding, prolonged air leak and chest tube placement , blood transfusion have all been discussed specifically.  I have quoted [name] a 2% of perioperative mortality and a complication rate as high as 30 %. The patient's questions have  been answered.Roneka Gilpin is willing  to proceed with the planned procedure.    Grace Isaac MD      [address] [place] [address] Office [phone]     03/29/2020 7:03 AM

## 2020-03-29 NOTE — Brief Op Note (Signed)
      [address]       [place] [address]             [phone]    03/29/2020  1:31 PM  PATIENT:  [name]  70 y.o. female  PRE-OPERATIVE DIAGNOSIS:  RML MASS  POST-OPERATIVE DIAGNOSIS:  RML MASS- non small cell lung cancer   PROCEDURE:  Procedure(s): VIDEO BRONCHOSCOPY AND VIDEO ASSISTED XI ROBOTIC THORASCOPY - RIGHT MIDDLE LOBE LUNG LOBECTOMY, WITH INTERCOSTAL NERVE BLOCK AND LYMPH NODE DISSECTION (Right)  SURGEON:  Surgeon(s) and Role:    * Grace Isaac, MD - Primary  PHYSICIAN ASSISTANT:   Nicholes Rough, PA-C   ANESTHESIA:   general  EBL:  100 mL   BLOOD ADMINISTERED:none  DRAINS: ROUTINE   LOCAL MEDICATIONS USED:  BUPIVICAINE   SPECIMEN:  Source of Specimen:  RIGHT MIDDLE LOBE  DISPOSITION OF SPECIMEN:  PATHOLOGY  COUNTS:  YES  DICTATION: .Dragon Dictation  PLAN OF CARE: Admit to inpatient   PATIENT DISPOSITION:  PACU - hemodynamically stable.   Delay start of Pharmacological VTE agent (>24hrs) due to surgical blood loss or risk of bleeding: no

## 2020-03-29 NOTE — Anesthesia Procedure Notes (Signed)
Central Venous Catheter Insertion Performed by: Catalina Gravel, MD, anesthesiologist Start/End2/08/2020 7:05 AM, 03/29/2020 7:15 AM Preanesthetic checklist: patient identified, IV checked, site marked, risks and benefits discussed, surgical consent, monitors and equipment checked, pre-op evaluation, timeout performed and anesthesia consent Position: Trendelenburg Lidocaine 1% used for infiltration and patient sedated Hand hygiene performed , maximum sterile barriers used  and Seldinger technique used Catheter size: 8 Fr Total catheter length 16. Central line was placed.Double lumen Procedure performed using ultrasound guided technique. Ultrasound Notes:anatomy identified, needle tip was noted to be adjacent to the nerve/plexus identified, no ultrasound evidence of intravascular and/or intraneural injection and image(s) printed for medical record Attempts: 1 Following insertion, line sutured, dressing applied and Biopatch. Post procedure assessment: blood return through all ports, free fluid flow and no air  Patient tolerated the procedure well with no immediate complications.

## 2020-03-30 ENCOUNTER — Inpatient Hospital Stay (HOSPITAL_COMMUNITY): Payer: Medicare Other

## 2020-03-30 ENCOUNTER — Encounter (HOSPITAL_COMMUNITY): Payer: Self-pay | Admitting: Cardiothoracic Surgery

## 2020-03-30 LAB — BASIC METABOLIC PANEL
Anion gap: 9 (ref 5–15)
BUN: 13 mg/dL (ref 8–23)
CO2: 26 mmol/L (ref 22–32)
Calcium: 8.1 mg/dL — ABNORMAL LOW (ref 8.9–10.3)
Chloride: 102 mmol/L (ref 98–111)
Creatinine, Ser: 0.98 mg/dL (ref 0.44–1.00)
GFR, Estimated: >60 mL/min (ref >60)
Glucose, Bld: 126 mg/dL — ABNORMAL HIGH (ref 70–99)
Potassium: 4.6 mmol/L (ref 3.5–5.1)
Sodium: 137 mmol/L (ref 135–145)

## 2020-03-30 LAB — CBC
HCT: 30.5 % — ABNORMAL LOW (ref 36.0–46.0)
Hemoglobin: 10 g/dL — ABNORMAL LOW (ref 12.0–15.0)
MCH: 28.7 pg (ref 26.0–34.0)
MCHC: 32.8 g/dL (ref 30.0–36.0)
MCV: 87.6 fL (ref 80.0–100.0)
Platelets: 285 10*3/uL (ref 150–400)
RBC: 3.48 MIL/uL — ABNORMAL LOW (ref 3.87–5.11)
RDW: 13.9 % (ref 11.5–15.5)
WBC: 11.8 10*3/uL — ABNORMAL HIGH (ref 4.0–10.5)
nRBC: 0 % (ref 0.0–0.2)

## 2020-03-30 LAB — BLOOD GAS, ARTERIAL
Acid-Base Excess: 0.6 mmol/L (ref 0.0–2.0)
Bicarbonate: 24.9 mmol/L (ref 20.0–28.0)
Drawn by: 23604
FIO2: 28
O2 Saturation: 97.6 %
Patient temperature: 36.8
pCO2 arterial: 40.7 mmHg (ref 32.0–48.0)
pH, Arterial: 7.402 (ref 7.350–7.450)
pO2, Arterial: 95.3 mmHg (ref 83.0–108.0)

## 2020-03-30 LAB — GLUCOSE, CAPILLARY
Glucose-Capillary: 127 mg/dL — ABNORMAL HIGH (ref 70–99)
Glucose-Capillary: 103 mg/dL — ABNORMAL HIGH (ref 70–99)
Glucose-Capillary: 111 mg/dL — ABNORMAL HIGH (ref 70–99)
Glucose-Capillary: 103 mg/dL — ABNORMAL HIGH (ref 70–99)
Glucose-Capillary: 138 mg/dL — ABNORMAL HIGH (ref 70–99)
Glucose-Capillary: 103 mg/dL — ABNORMAL HIGH (ref 70–99)

## 2020-03-30 LAB — SURGICAL PATHOLOGY

## 2020-03-30 MED ORDER — PANTOPRAZOLE SODIUM 40 MG PO TBEC
40.0000 mg | DELAYED_RELEASE_TABLET | Freq: Every day | ORAL | Status: DC
  Administered 2020-03-30 – 2020-04-03 (×5): 40 mg via ORAL
  Filled 2020-03-30 (×5): qty 1

## 2020-03-30 NOTE — Op Note (Signed)
NAMESYMIA, HERDT MEDICAL RECORD [id number] ACCOUNT [id number] DATE OF BIRTH:1951-01-16 FACILITY: [facility] LOCATION: [facility] PHYSICIAN:Shanera Meske Maryruth Bun, MD  OPERATIVE REPORT  DATE OF PROCEDURE:  03/29/2020  PREOPERATIVE DIAGNOSIS:  Right middle lobe hypermetabolic mass, highly suggestive of malignancy.  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  PROCEDURE PERFORMED:  Bronchoscopy, right robotic thoracoscopy with right middle lobectomy, lymph node dissection, and intercostal nerve block.  SURGEON:  Lanelle Bal, MD  FIRST ASSISTANT:  Nicholes Rough, PA.  BRIEF HISTORY:  The patient is a 70 year old female who has had a long history of smoking, but quit recently.  She had several CT scans over the past 2 years first in [name]020  that showed enlarging right middle lobe mass.  PET scan was performed that showed this  to be significantly hypermetabolic, but consistent with malignancy, but without evidence of metastatic disease.  She obtained pulmonary function studies, echocardiogram, cardiology clearance and PFTs and it was recommended to her that we proceed with  resection.  We discussed attempting to obtain a preoperative diagnosis, but with this mass being in the middle of the right middle lobe and its character that was highly suggestive of malignancy and the patient was suitable for surgical resection, she  agreed and signed informed consent.  When first seen, she has been encouraged to get a COVID vaccination.  She delayed in this and then finally on urgent obtained a COVID vaccination and 1 week later developed COVID infection.  This was in January and  delayed her surgery until now.  She recovered from her infection relatively quickly without significant respiratory difficulty.  DESCRIPTION OF PROCEDURE:  With central line and arterial line monitors in place, the patient underwent general endotracheal anesthesia with a single lumen endotracheal tube.   Appropriate timeout was performed and we proceeded with video bronchoscopy  with a 2.8 mm bronchoscope to the subsegmental level in both right and left tracheobronchial trees.  The tracheobronchial trees were free of any endobronchial lesions.  Specifically, the right middle lobe and no endobronchial lesions appreciated and the  scope was removed.  A double lumen endotracheal tube was placed.  The patient was turned in lateral decubitus position with the right side up.  The right chest was prepped with Betadine, draped in the usual sterile manner.  The right chest had  preoperatively been marked.  A second timeout was done.  We then placed 4 robotic ports, two 12 mm ports, a single 8 mm port along the midaxillary line for camera and a posterior port, all along 8th intercostal rib.  The ports were placed, first with  Optiview trocar at the camera site entering the right chest.  Insufflation was begun.  Then, under direct vision, the remaining ports were placed.  A single 12 mm accessory air seal port was placed inferior to the posterior 12 port with good collapse of the  lung.  We then moved to the robotic portion of the procedure, placing an 8 mm camera, docking the robot targeting towards the middle lobe.  The remaining arms were attached to the ports.  We then proceeded with resection.  There was no evidence of  pleural spread.  The middle lobe was identified, and as we retracted the middle lobe, superior area of the fissure came open, there was an area in the midportion of the middle lobe that was puckered and consistent with malignancy.  Due to the location,  it was felt best to proceed with complete lobectomy of  the middle lobe rather than trying to wedge and biopsy.  We developed along the fissure using the Cardiere and bipolar grasper dissecting the fissure open.  The pulmonary artery to the lower lobe was  identified arising from the SA arterial branch to the middle lobe was identified.  Dissection  was carried along taking out a large #10 lymph node, frozen section of this showed no malignancy.  We continued dissection until we had identified the  bronchus.  Upper and middle lobe were then retracted posteriorly.  This gave a view of the superior pulmonary vein.  This was dissected and identified the venous drainage from the middle lobe branch with one of the pulmonary artery branches to the  bronchus and the venous drainage of the middle lobe all identified.  We then proceeded first with encircling with a small vessel loop the pulmonary vein branch.  A single white load vascular stapler was placed around this and divided.  We then moved to  the medial branch of the pulmonary artery to the middle lobe.  This was also encircled with a vessel loop and divided with a white load vascular stapler.  As we came to the bronchus, careful dissection around the bronchus was done and a lateral branch to  the middle lobe of the pulmonary artery was identified.  We then divided the bronchus with a green load stapler.  First checking prior to firing the stapler, we obtained good inflation of the upper and lower lobes.  The bronchus was divided, exposing  the remaining pulmonary artery branch.  This was divided with a white load vascular stapler.  We then completed the fissure with a series of blue load staples, ultimately freeing the middle lobe from the upper lobe.  The specimen was then placed in a  bag.  In addition to the dissection of the 10R nodes along the bronchus, which were submitted, additional 11R nodes, 4R, multiple 10R and 11R nodes were all sampled and sent to pathology separately.  With this node removed and the specimen free, we then  undocked the robot.  We used the most anterior 12 port to bring out the specimen bag.  This was submitted to pathology for frozen section of this margins and specimen.  The mass itself was characterized as non-small cell carcinoma with bronchial and  vascular margins  reported as negative.  We then infiltrated solution of saline, bupivacaine and Exparel along the posterior ribs under direct vision with the video scope and the ports were all removed.  A 28 Blake chest tube was left through the camera  port.  The other ports were closed with interrupted 0 Vicryl, running 3-0 Vicryl in the subcutaneous tissue and a 3-0 subcuticular in the skin edges.  Dermabond was applied.  The lung reinflated nicely without air leak.  The patient was awakened and  extubated in the operating room and transferred to the recovery room for further postoperative care.  Sponge and needle count was reported as correct at completion of procedure.  Estimated blood loss was less than 100 mL.  IN/NUANCE  D:03/29/2020 T:03/30/2020 JOB:[id number]

## 2020-03-30 NOTE — Progress Notes (Addendum)
[address]       [place] [address]             [phone]      1 Day Post-Op Procedure(s) (LRB): VIDEO BRONCHOSCOPY AND VIDEO ASSISTED XI ROBOTIC THORASCOPY - RIGHT MIDDLE LOBE LUNG LOBECTOMY, WITH INTERCOSTAL NERVE BLOCK AND LYMPH NODE DISSECTION (Right) Subjective: Feels sore, not SOB  Objective: Vital signs in last 24 hours: Temp:  [96.1 F (35.6 C)-98.3 F (36.8 C)] 98.3 F (36.8 C) (02/08 0811) Pulse Rate:  [68-85] 71 (02/08 0811) Cardiac Rhythm: Normal sinus rhythm (02/08 0733) Resp:  [13-18] 15 (02/08 0811) BP: (92-156)/(55-81) 94/55 (02/08 0811) SpO2:  [95 %-100 %] 99 % (02/08 0811) Arterial Line BP: (93-157)/(51-119) 93/51 (02/08 0400)  Hemodynamic parameters for last 24 hours:    Intake/Output from previous day: 02/07 0701 - 02/08 0700 In: 2270 [P.O.:120; I.V.:1600; IV Piggyback:550] Out: 2520 [Urine:1700; Blood:100; Chest Tube:720] Intake/Output this shift: No intake/output data recorded.  General appearance: alert, cooperative and no distress Heart: regular rate and rhythm Lungs: dim right lower fields Abdomen: soft, non-tender Extremities: no edema or calf tenderness, PAS in place Wound: incis healing well  Lab Results: Recent Labs    03/29/20 1135 03/30/20 0454  WBC  --  11.8*  HGB 12.6 10.0*  HCT 37.0 30.5*  PLT  --  285   BMET:  Recent Labs    03/29/20 1135 03/30/20 0454  NA 137 137  K 4.1 4.6  CL  --  102  CO2  --  26  GLUCOSE  --  126*  BUN  --  13  CREATININE  --  0.98  CALCIUM  --  8.1*    PT/INR: No results for input(s): LABPROT, INR in the last 72 hours. ABG    Component Value Date/Time   PHART 7.402 03/30/2020 0340   HCO3 24.9 03/30/2020 0340   TCO2 29 03/29/2020 1135   O2SAT 97.6 03/30/2020 0340   CBG (last 3)  Recent Labs    03/29/20 2300 03/30/20 0404 03/30/20 0829  GLUCAP 123* 127* 103*    Meds Scheduled Meds: . acetaminophen  1,000 mg Oral Q6H   Or  . acetaminophen (TYLENOL)  oral liquid 160 mg/5 mL  1,000 mg Oral Q6H  . bisacodyl  10 mg Oral Daily  . Chlorhexidine Gluconate Cloth  6 each Topical Daily  . enoxaparin (LOVENOX) injection  40 mg Subcutaneous Q24H  . insulin aspart  0-24 Units Subcutaneous Q4H  . ketorolac  15 mg Intravenous Q8H  . pantoprazole  40 mg Oral Daily  . rosuvastatin  5 mg Oral Q supper  . senna-docusate  1 tablet Oral QHS   Continuous Infusions: . sodium chloride     PRN Meds:.Place/Maintain arterial line **AND** sodium chloride, ondansetron (ZOFRAN) IV, traMADol  Xrays DG Chest 1 View  Result Date: 03/30/2020 CLINICAL DATA:  Chest tube post lobectomy EXAM: CHEST  1 VIEW COMPARISON:  03/29/2020 FINDINGS: Right IJ central line and right chest tube again identified. Stable minimal right apical pneumothorax. Postoperative changes of the right lung. Patchy right lung density may reflect atelectasis. Stable cardiomediastinal contours. IMPRESSION: Stable minimal right apical pneumothorax. Patchy right lung density may reflect atelectasis. Electronically Signed   By: Macy Mis M.D.   On: 03/30/2020 08:10   DG Chest 2 View  Result Date: 03/29/2020 CLINICAL DATA:  70 year old female preoperative study for right lung lesion. EXAM: CHEST - 2 VIEW COMPARISON:  CT 01/23/2020.  Radiographs  01/13/2020. FINDINGS: Lung volumes and mediastinal contours remain stable. Patchy and spiculated opacity in the right mid lung is stable to slightly regressed since November. No superimposed pneumothorax, pulmonary edema, pleural effusion. Underlying emphysema as demonstrated by CT. No acute osseous abnormality identified. Negative visible bowel gas pattern. IMPRESSION: Emphysema ([id number]) with known right mid lung lesion. No new acardiopulmonary abnormality. Electronically Signed   By: Genevie Ann M.D.   On: 03/29/2020 06:21   DG Chest Port 1 View  Result Date: 03/29/2020 CLINICAL DATA:  Status post robot assisted surgical procedure. EXAM: PORTABLE CHEST 1 VIEW  COMPARISON:  Same day. FINDINGS: The heart size and mediastinal contours are within normal limits. Right-sided chest tube is noted with minimal right apical pneumothorax. Mild right upper lobe subsegmental atelectasis and basilar atelectasis is noted. Left lung is unremarkable. The visualized skeletal structures are unremarkable. IMPRESSION: Right-sided chest tube is noted with minimal right apical pneumothorax. Mild right upper lobe subsegmental atelectasis and basilar atelectasis is noted. Aortic Atherosclerosis (ICD10-I70.0). Electronically Signed   By: Marijo Conception M.D.   On: 03/29/2020 14:32    Assessment/Plan: S/P Procedure(s) (LRB): VIDEO BRONCHOSCOPY AND VIDEO ASSISTED XI ROBOTIC THORASCOPY - RIGHT MIDDLE LOBE LUNG LOBECTOMY, WITH INTERCOSTAL NERVE BLOCK AND LYMPH NODE DISSECTION (Right)   1 afeb, SBP 90's to 150's 2 sats good on RA 3 minimal right apical pntx on CXR- CT with 720 cc recorded- leave in place 4 d/c aline and foley 5 good UOP, normal renal fxn, Lytes ok 6 expected ABLA- monitor 7 BS adeq controlled- not on DM meds preop 9 pulm toilet/routine rehab 10 lovenox for DVT PPX   LOS: 1 day    John Giovanni PA-C Pager [phone] 03/30/2020  No air leak this am D/c foley and a line  Leaving chest tube in for today  I have seen and examined [name] and agree with the above assessment  and plan.  Grace Isaac MD Beeper [phone] Office [phone] 03/30/2020 2:58 PM

## 2020-03-31 ENCOUNTER — Inpatient Hospital Stay (HOSPITAL_COMMUNITY): Payer: Medicare Other

## 2020-03-31 DIAGNOSIS — C3491 Malignant neoplasm of unspecified part of right bronchus or lung: Secondary | ICD-10-CM | POA: Diagnosis present

## 2020-03-31 LAB — COMPREHENSIVE METABOLIC PANEL
ALT: 16 U/L (ref 0–44)
AST: 21 U/L (ref 15–41)
Albumin: 2.5 g/dL — ABNORMAL LOW (ref 3.5–5.0)
Alkaline Phosphatase: 43 U/L (ref 38–126)
Anion gap: 7 (ref 5–15)
BUN: 14 mg/dL (ref 8–23)
CO2: 27 mmol/L (ref 22–32)
Calcium: 7.9 mg/dL — ABNORMAL LOW (ref 8.9–10.3)
Chloride: 102 mmol/L (ref 98–111)
Creatinine, Ser: 0.93 mg/dL (ref 0.44–1.00)
GFR, Estimated: >60 mL/min (ref >60)
Glucose, Bld: 111 mg/dL — ABNORMAL HIGH (ref 70–99)
Potassium: 3.9 mmol/L (ref 3.5–5.1)
Sodium: 136 mmol/L (ref 135–145)
Total Bilirubin: 0.6 mg/dL (ref 0.3–1.2)
Total Protein: 4.9 g/dL — ABNORMAL LOW (ref 6.5–8.1)

## 2020-03-31 LAB — GLUCOSE, CAPILLARY
Glucose-Capillary: 97 mg/dL (ref 70–99)
Glucose-Capillary: 185 mg/dL — ABNORMAL HIGH (ref 70–99)
Glucose-Capillary: 85 mg/dL (ref 70–99)
Glucose-Capillary: 98 mg/dL (ref 70–99)

## 2020-03-31 LAB — CBC
HCT: 25.7 % — ABNORMAL LOW (ref 36.0–46.0)
Hemoglobin: 8.5 g/dL — ABNORMAL LOW (ref 12.0–15.0)
MCH: 30.1 pg (ref 26.0–34.0)
MCHC: 33.1 g/dL (ref 30.0–36.0)
MCV: 91.1 fL (ref 80.0–100.0)
Platelets: 207 10*3/uL (ref 150–400)
RBC: 2.82 MIL/uL — ABNORMAL LOW (ref 3.87–5.11)
RDW: 14.3 % (ref 11.5–15.5)
WBC: 8 10*3/uL (ref 4.0–10.5)
nRBC: 0 % (ref 0.0–0.2)

## 2020-03-31 MED ORDER — FE FUMARATE-B12-VIT C-FA-IFC PO CAPS
1.0000 | ORAL_CAPSULE | Freq: Two times a day (BID) | ORAL | Status: DC
  Administered 2020-03-31 (×2): 1 via ORAL
  Filled 2020-03-31 (×8): qty 1

## 2020-03-31 MED ORDER — SIMETHICONE 80 MG PO CHEW
80.0000 mg | CHEWABLE_TABLET | Freq: Four times a day (QID) | ORAL | Status: DC | PRN
  Administered 2020-03-31: 80 mg via ORAL
  Filled 2020-03-31: qty 1

## 2020-03-31 NOTE — Progress Notes (Signed)
Patient's chest tube was removed at 5248 without complication. Pain medication was administered shortly before removal, and full set of vitals were taken. Stay stitch tied and intact, vaseline and guaze dressings were used to dress wound.

## 2020-03-31 NOTE — Plan of Care (Signed)
  Problem: Education: Goal: Knowledge of General Education information will improve Description: Including pain rating scale, medication(s)/side effects and non-pharmacologic comfort measures Outcome: Progressing   Problem: Health Behavior/Discharge Planning: Goal: Ability to manage health-related needs will improve Outcome: Progressing   Problem: Clinical Measurements: Goal: Cardiovascular complication will be avoided Outcome: Progressing   Problem: Activity: Goal: Risk for activity intolerance will decrease Outcome: Progressing   Problem: Nutrition: Goal: Adequate nutrition will be maintained Outcome: Progressing   Problem: Pain Managment: Goal: General experience of comfort will improve Outcome: Progressing

## 2020-03-31 NOTE — Progress Notes (Addendum)
[address]       [place] [address]             [phone]      2 Days Post-Op Procedure(s) (LRB): VIDEO BRONCHOSCOPY AND VIDEO ASSISTED XI ROBOTIC THORASCOPY - RIGHT MIDDLE LOBE LUNG LOBECTOMY, WITH INTERCOSTAL NERVE BLOCK AND LYMPH NODE DISSECTION (Right) Subjective: Feels ok, better than yesterday  Objective: Vital signs in last 24 hours: Temp:  [97.7 F (36.5 C)-98.8 F (37.1 C)] 97.7 F (36.5 C) (02/09 0320) Pulse Rate:  [69-83] 76 (02/09 0320) Cardiac Rhythm: Normal sinus rhythm (02/09 0700) Resp:  [15-18] 18 (02/09 0320) BP: (94-125)/(55-63) 103/63 (02/09 0320) SpO2:  [97 %-99 %] 97 % (02/09 0320)  Hemodynamic parameters for last 24 hours:    Intake/Output from previous day: 02/08 0701 - 02/09 0700 In: -  Out: 420 [Urine:200; Chest Tube:220] Intake/Output this shift: No intake/output data recorded.  General appearance: alert, cooperative and no distress Heart: regular rate and rhythm Lungs: dim air exchange throughout Abdomen: benign Extremities: no edema or calf tenderness Wound: incis healing well  Lab Results: Recent Labs    03/30/20 0454 03/31/20 0331  WBC 11.8* 8.0  HGB 10.0* 8.5*  HCT 30.5* 25.7*  PLT 285 207   BMET:  Recent Labs    03/30/20 0454 03/31/20 0331  NA 137 136  K 4.6 3.9  CL 102 102  CO2 26 27  GLUCOSE 126* 111*  BUN 13 14  CREATININE 0.98 0.93  CALCIUM 8.1* 7.9*    PT/INR: No results for input(s): LABPROT, INR in the last 72 hours. ABG    Component Value Date/Time   PHART 7.402 03/30/2020 0340   HCO3 24.9 03/30/2020 0340   TCO2 29 03/29/2020 1135   O2SAT 97.6 03/30/2020 0340   CBG (last 3)  Recent Labs    03/30/20 1952 03/30/20 2329 03/31/20 0350  GLUCAP 138* 103* 97    Meds Scheduled Meds: . acetaminophen  1,000 mg Oral Q6H   Or  . acetaminophen (TYLENOL) oral liquid 160 mg/5 mL  1,000 mg Oral Q6H  . bisacodyl  10 mg Oral Daily  . Chlorhexidine Gluconate Cloth  6 each Topical  Daily  . enoxaparin (LOVENOX) injection  40 mg Subcutaneous Q24H  . insulin aspart  0-24 Units Subcutaneous Q4H  . ketorolac  15 mg Intravenous Q8H  . pantoprazole  40 mg Oral Daily  . rosuvastatin  5 mg Oral Q supper  . senna-docusate  1 tablet Oral QHS   Continuous Infusions: . sodium chloride     PRN Meds:.Place/Maintain arterial line **AND** sodium chloride, ondansetron (ZOFRAN) IV, traMADol  Xrays DG Chest 1 View  Result Date: 03/30/2020 CLINICAL DATA:  Chest tube post lobectomy EXAM: CHEST  1 VIEW COMPARISON:  03/29/2020 FINDINGS: Right IJ central line and right chest tube again identified. Stable minimal right apical pneumothorax. Postoperative changes of the right lung. Patchy right lung density may reflect atelectasis. Stable cardiomediastinal contours. IMPRESSION: Stable minimal right apical pneumothorax. Patchy right lung density may reflect atelectasis. Electronically Signed   By: Macy Mis M.D.   On: 03/30/2020 08:10   DG Chest Port 1 View  Result Date: 03/29/2020 CLINICAL DATA:  Status post robot assisted surgical procedure. EXAM: PORTABLE CHEST 1 VIEW COMPARISON:  Same day. FINDINGS: The heart size and mediastinal contours are within normal limits. Right-sided chest tube is noted with minimal right apical pneumothorax. Mild right upper lobe subsegmental atelectasis and basilar atelectasis is noted. Left  lung is unremarkable. The visualized skeletal structures are unremarkable. IMPRESSION: Right-sided chest tube is noted with minimal right apical pneumothorax. Mild right upper lobe subsegmental atelectasis and basilar atelectasis is noted. Aortic Atherosclerosis (ICD10-I70.0). Electronically Signed   By: Marijo Conception M.D.   On: 03/29/2020 14:32    Assessment/Plan: S/P Procedure(s) (LRB): VIDEO BRONCHOSCOPY AND VIDEO ASSISTED XI ROBOTIC THORASCOPY - RIGHT MIDDLE LOBE LUNG LOBECTOMY, WITH INTERCOSTAL NERVE BLOCK AND LYMPH NODE DISSECTION (Right)  1 afeb, VSS 2 sats ok on  2 liters 3 CT still with mod drainage- 420 cc/24 hours- poss remove later today 4 CXR mod atx   5 ABLA anemia is worse- monitor, start Iron- repeat CBC in am 6 normal renal fxn 7 protein cal malnutrition with albumin 2.5 8 BS controlled 9 no leukocytosis  SURGICAL PATHOLOGY  CASE: [id number]  PATIENT: [name]  Surgical Pathology Report      Clinical History: RML mass (nt)        FINAL MICROSCOPIC DIAGNOSIS:   A. LYMPH NODE, STATION 10R#5, BIOPSY:  - Lymph node, negative for carcinoma (0/1)   B. 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 LYMPH NODE, STATION 10R, BIOPSY:  - Lymph node, negative for carcinoma (0/1)   D. LYMPH NODE, STATION 10R#2, BIOPSY:  - Lymph node, negative for carcinoma (0/1)   E. LYMPH NODE, STATION 10R#3, BIOPSY:  - Lymph node, negative for carcinoma (0/1)   F. LYMPH NODE, STATION 10R#4, BIOPSY:  - Lymph node, negative for carcinoma (0/1)   G. LYMPH NODE, STATION 10R#6, BIOPSY:  - Lymph node, negative for carcinoma (0/1)   H. LYMPH NODE, STATION 11R#1, BIOPSY:  - Scant fibroconnective tissue, negative for carcinoma  - Lymphoid tissue is not identified   I. LYMPH NODE, STATION 4R#1, BIOPSY:  - Lymph node, negative for carcinoma (0/1)     ONCOLOGY TABLE:   LUNG: Resection   Synchronous Tumors: Not applicable  Total Number of Primary Tumors: 1  Procedure: Lobectomy  Specimen Laterality: Right  Tumor Focality: Unifocal  Tumor Site: Middle lobe  Tumor Size: 1.8 cm  Histologic Type: Adenocarcinoma  Visceral Pleura Invasion: Carcinoma focally invades beyond the elastic  layer of visceral pleura  Direct Invasion of Adjacent Structures: No adjacent structures present  Lymphovascular Invasion: Not identified  Margins:  All margins negative for invasive carcinoma    Closest Margin(s) to Invasive Carcinoma: Bronchial margin  Treatment Effect: No known presurgical therapy  Regional Lymph Nodes:    Number of Lymph Nodes Involved: 0               Nodal Sites with Tumor: Not applicable    Number of Lymph Nodes Examined: 7            Nodal Sites Examined: 4R, 10R and 11R  Distant Metastasis:    Distant Site(s) Involved: Not applicable  Pathologic Stage Classification (pTNM, AJCC 8th Edition): pT2a, pN0  Ancillary Studies: Can be performed upon request  Representative Tumor Block: B3  Comment(s): None  Cancer Staging Primary lung cancer, right middle lobe - resected Staging form: Lung, AJCC 8th Edition - Pathologic stage from 03/31/2020: Stage IB (pT2a, pN0, cM0) - Signed by Grace Isaac, MD on 03/31/2020  10 lovenox for DVT ppx      LOS: 2  days    John Giovanni PA-C Pager [phone] 03/31/2020  Decreased drainage in afternoon, chest too be removed  I have seen and examined [name] and agree with the above assessment  and plan.  Grace Isaac MD Beeper [phone] Office [phone] 04/01/2020 8:00 AM

## 2020-04-01 ENCOUNTER — Ambulatory Visit: Payer: Medicare Other | Admitting: Cardiology

## 2020-04-01 ENCOUNTER — Inpatient Hospital Stay (HOSPITAL_COMMUNITY): Payer: Medicare Other

## 2020-04-01 LAB — CBC
HCT: 25.4 % — ABNORMAL LOW (ref 36.0–46.0)
Hemoglobin: 8.5 g/dL — ABNORMAL LOW (ref 12.0–15.0)
MCH: 29.9 pg (ref 26.0–34.0)
MCHC: 33.5 g/dL (ref 30.0–36.0)
MCV: 89.4 fL (ref 80.0–100.0)
Platelets: 216 10*3/uL (ref 150–400)
RBC: 2.84 MIL/uL — ABNORMAL LOW (ref 3.87–5.11)
RDW: 14.2 % (ref 11.5–15.5)
WBC: 7.9 10*3/uL (ref 4.0–10.5)
nRBC: 0 % (ref 0.0–0.2)

## 2020-04-01 NOTE — Progress Notes (Addendum)
      [address]       [place] [address]             [phone]      3 Days Post-Op Procedure(s) (LRB): VIDEO BRONCHOSCOPY AND VIDEO ASSISTED XI ROBOTIC THORASCOPY - RIGHT MIDDLE LOBE LUNG LOBECTOMY, WITH INTERCOSTAL NERVE BLOCK AND LYMPH NODE DISSECTION (Right) Subjective: Feels okay this morning. Less nausea than yesterday. She was unable to do much yesterday since she felt so bad.  Objective: Vital signs in last 24 hours: Temp:  [97.7 F (36.5 C)-98.5 F (36.9 C)] 98.5 F (36.9 C) (02/10 0314) Pulse Rate:  [66-94] 71 (02/10 0314) Cardiac Rhythm: Normal sinus rhythm (02/10 0716) Resp:  [16-18] 16 (02/10 0314) BP: (108-133)/(57-66) 117/64 (02/10 0314) SpO2:  [86 %-99 %] 98 % (02/10 0314)    Intake/Output from previous day: 02/09 0701 - 02/10 0700 In: 420 [P.O.:420] Out: 45 [Chest Tube:45] Intake/Output this shift: No intake/output data recorded.  General appearance: alert, cooperative and no distress Heart: regular rate and rhythm, S1, S2 normal, no murmur, click, rub or gallop Lungs: clear to auscultation bilaterally Abdomen: soft, non-tender; bowel sounds normal; no masses,  no organomegaly Extremities: extremities normal, atraumatic, no cyanosis or edema Wound: clean and dry  Lab Results: Recent Labs    03/31/20 0331 04/01/20 0326  WBC 8.0 7.9  HGB 8.5* 8.5*  HCT 25.7* 25.4*  PLT 207 216   BMET:  Recent Labs    03/30/20 0454 03/31/20 0331  NA 137 136  K 4.6 3.9  CL 102 102  CO2 26 27  GLUCOSE 126* 111*  BUN 13 14  CREATININE 0.98 0.93  CALCIUM 8.1* 7.9*    PT/INR: No results for input(s): LABPROT, INR in the last 72 hours. ABG    Component Value Date/Time   PHART 7.402 03/30/2020 0340   HCO3 24.9 03/30/2020 0340   TCO2 29 03/29/2020 1135   O2SAT 97.6 03/30/2020 0340   CBG (last 3)  Recent Labs    03/31/20 0807 03/31/20 1132 03/31/20 1644  GLUCAP 185* 85 98    Assessment/Plan: S/P Procedure(s) (LRB): VIDEO  BRONCHOSCOPY AND VIDEO ASSISTED XI ROBOTIC THORASCOPY - RIGHT MIDDLE LOBE LUNG LOBECTOMY, WITH INTERCOSTAL NERVE BLOCK AND LYMPH NODE DISSECTION (Right)  1. CV-NSR in the 70s, BP well controlled 2. Pulm-tolerating 2L [place] with good oxygen saturation. Space appears a little larger on the right this morning's CXR. Chest tube removed yesterday. 3. H and H 8.5/25.4, stable 4. Endo-blood glucose well controlled  5. Continue lovenox for DVT prophylaxis  Plan: Continue to work on nausea today. Remove central line and get peripheral IV. Ambulate in the halls today and use incentive spirometer. Weaning oxygen as able.    LOS: 3 days    Elgie Collard 04/01/2020  Plan home when gi symptoms resolved  I have seen and examined [name] and agree with the above assessment  and plan.  Grace Isaac MD Beeper [phone] Office [phone] 04/02/2020 2:50 PM

## 2020-04-01 NOTE — Discharge Summary (Signed)
[address]       [place] [address]             [phone]      Physician Discharge Summary  Patient ID: [name] MRN: [id number] DOB/AGE: Mar 02, 1950 70 y.o.  Admit date: 03/29/2020 Discharge date: 04/03/2020  Admission Diagnoses: Patient Active Problem List   Diagnosis Date Noted  . Acute laryngopharyngitis 03/18/2020  . Pre-operative cardiovascular examination 03/01/2020  . Coronary artery calcification seen on CT scan 03/01/2020  . Cardiac murmur 03/01/2020  . Hiatal hernia   . Hyperlipemia   . Aortic atherosclerosis ([place] [facility]) 01/28/2020  . Visit for screening mammogram 01/28/2020  . Stenosis of left subclavian artery ([place] [facility]) 01/28/2020  . Abnormal chest x-ray 01/19/2020  . Cough 01/12/2020    Discharge Diagnoses:  Active Problems:   S/P robot-assisted surgical procedure   Primary lung cancer, right middle lobe - resected   Discharged Condition: good  HPI:   [name] 70 y.o. female is seen in the office  today for follow evaluation  of abnormal CT scan of the chest done on 01/28/2020. She returns today with PFT's and PET scan   Patient notes in December 2021 she had couple weeks of cough and sputum production,this led to a chest x-ray and ultimately ultimately a CT scan of the chest. Chest x-ray showed mass  right lung field,per reports this would been noted on CT scan of the chest in July,has been recommended the patient have a follow-up CT scan in 6 months.  Patient is a long-term smoker quitting approximately 3 years ago-  Patient has not been vaccinated for Covid-she was encouraged to do this, since she was last seen still has not proceed with covid immunization   Patient notes previous cardica workup for murmur - she has no history of MI or stent placement - she has seen cardioogy had has preop clearence - stress test done.  After encouraging patient when first seen to get covid vaccination  when first seen , she finally  did - one week later developed + covid - COVID-19 on 1/19, symptoms were mild, was treated outpatient withSotrovimab infusionon 1/21. Seen by PCP 03/23/2020, noted to be feeling much better, had some mild congestion and cough, treated with azithromycin    Patient is now back to baseline with respiratory status.    Hospital Course:   Ms. Lindahl underwent a right robotic thoracoscopy middle lobectomy, lymph node dissection, and intercostal nerve block with Dr. Servando Snare on 03/29/2020. She tolerated the procedure well and was transferred to the step down unit for continued care. Her chest tube was discontinued POD 2 without issue. Her hemoglobin remained stable. We continued her iron supplementation. She remains on 2L [place] and we are weaning as tolerated. POD 3 she remained stable. She had some nausea the previous day so that set her back some from using her IS and walking around the unit. She was feeling a little better and started doing more for herself. Her CXR showed a slight increase in pneumothorax. She will likely always have a space due to her lung resection. We continued to encourage the use of incentive spirometer and ambulation around the unit. Her final CXR showed: stable appearance of apical space. Today, she is ambulating with limited assistance, tolerating room air, her incisions are all healing well. She feels her nausea was secondary to scheduled Tylenol and not taking her medication for GERD (substitute during hospital stay). She has less nausea  today and would like to go home. She will be given a prescription for Zofran ODT PRN. She is felt surgically stable for discharge today.  Consults: None  Significant Diagnostic Studies:  CLINICAL DATA:  Sore chest post chest tube removal .   EXAM: PORTABLE CHEST 1 VIEW  COMPARISON:  03/31/2020  FINDINGS: Right chest tube is no longer present. Right IJ central line tip is unchanged.  Increased size of small right apical pneumothorax.  Postoperative changes of the right lung with improved aeration. Possible small right pleural effusion. Similar right chest wall emphysema. Stable cardiomediastinal contours.  IMPRESSION: Increased size of small right apical pneumothorax post chest tube removal. Improved aeration of the right lung.   Electronically Signed   By: Macy Mis M.D.   On: 04/01/2020 08:07   Treatments:  NAME: DESTA, BUJAK MEDICAL RECORD [id number] ACCOUNT [id number] DATE OF BIRTH:04/04/1950 FACILITY: [facility] LOCATION: [place], [place]  OPERATIVE REPORT  DATE OF PROCEDURE:  03/29/2020  PREOPERATIVE DIAGNOSIS:  Right middle lobe hypermetabolic mass, highly suggestive of malignancy.  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  PROCEDURE PERFORMED:  Bronchoscopy, right robotic thoracoscopy with right middle lobectomy, lymph node dissection, and intercostal nerve block.  SURGEON:  Lanelle Bal, [place]  FIRST ASSISTANT:  Nicholes Rough, [place].  Discharge Exam: Blood pressure 131/73, pulse 80, temperature 98.9 F (37.2 C), temperature source Oral, resp. rate 18, height 5\' 7"  (1.702 m), weight 67.9 kg, SpO2 94 %.  Cardiovascular: RRR Pulmonary: Clear to auscultation bilaterally Abdomen: Soft, non tender, bowel sounds present. Wounds: Clean and dry.  No erythema or signs of infection.    Disposition: Discharge disposition: 01-Home or Self Care       Allergies as of 04/03/2020      Reactions   Codeine Hives   Surgical Lubricant Hives      Medication List    STOP taking these medications   azithromycin 250 MG tablet Commonly known as: ZITHROMAX   benzonatate 200 MG capsule Commonly known as: TESSALON     TAKE these medications   acetaminophen 500 MG tablet Commonly known as: TYLENOL Take 500 mg by mouth every 6 (six) hours as needed for mild pain.   CO Q-10 PO Take 2 tablets by mouth daily.   ibuprofen 200 MG  tablet Commonly known as: ADVIL Take 200 mg by mouth every 6 (six) hours as needed for mild pain.   MULTI FOR HER 50+ PO Take 2 tablets by mouth daily.   omeprazole 20 MG capsule Commonly known as: PRILOSEC Take 1 capsule (20 mg total) by mouth daily. What changed: when to take this   ondansetron 4 MG disintegrating tablet Commonly known as: Zofran ODT Take 1 tablet (4 mg total) by mouth every 8 (eight) hours as needed for nausea or vomiting.   rosuvastatin 5 MG tablet Commonly known as: Crestor Take 1 tablet (5 mg total) by mouth daily. What changed: when to take this   traMADol 50 MG tablet Commonly known as: ULTRAM Take 1 tablet (50 mg total) by mouth every 6 (six) hours as needed for severe pain (mild pain).   VITAMIN C PO Take 3 tablets by mouth daily.   VITAMIN D PO Take 2 tablets by mouth daily.       Follow-up Information    Grace Isaac, MD Follow up.   Specialty: Cardiothoracic Surgery Why: Your routine follow-up apointment is on 04/14/2020 at 4:00pm. Please arrive at 3:30pm for a chest xray  which is located at [facility] which is on the first floor of our building.  Contact information: [address] [place] [address] [phone]               Signed:  Original Note by Nicholes Rough PA-C  04/03/2020, 1:40 PM

## 2020-04-01 NOTE — Progress Notes (Signed)
Ambulated along the hallway about 300 feet,  on room air. Noted slight sob  at the end of ambulation.  Pulse ox-88.  Continue to monitor.

## 2020-04-01 NOTE — Care Management Important Message (Signed)
Important Message  Patient Details  Name: [name] MRN: [id number] Date of Birth: Jul 18, 1950   Medicare Important Message Given:  Yes     Orbie Pyo 04/01/2020, 2:20 PM

## 2020-04-02 ENCOUNTER — Inpatient Hospital Stay (HOSPITAL_COMMUNITY): Payer: Medicare Other

## 2020-04-02 NOTE — Progress Notes (Addendum)
      [address]       [place] [address]             [phone]      4 Days Post-Op Procedure(s) (LRB): VIDEO BRONCHOSCOPY AND VIDEO ASSISTED XI ROBOTIC THORASCOPY - RIGHT MIDDLE LOBE LUNG LOBECTOMY, WITH INTERCOSTAL NERVE BLOCK AND LYMPH NODE DISSECTION (Right)   Subjective:  Patient complains of nausea, states she isn't quite up to going home  Objective: Vital signs in last 24 hours: Temp:  [98 F (36.7 C)-98.8 F (37.1 C)] 98.3 F (36.8 C) (02/11 0751) Pulse Rate:  [81-94] 84 (02/11 0751) Cardiac Rhythm: Normal sinus rhythm (02/10 2000) Resp:  [15-18] 15 (02/11 0435) BP: (107-135)/(54-78) 135/78 (02/11 0751) SpO2:  [93 %-100 %] 100 % (02/11 0751)  Intake/Output from previous day: 02/10 0701 - 02/11 0700 In: 320 [P.O.:320] Out: -   General appearance: alert, cooperative and no distress Heart: regular rate and rhythm Lungs: clear to auscultation bilaterally Abdomen: soft, non-tender; bowel sounds normal; no masses,  no organomegaly Extremities: extremities normal, atraumatic, no cyanosis or edema Wound: clean and dry  Lab Results: Recent Labs    03/31/20 0331 04/01/20 0326  WBC 8.0 7.9  HGB 8.5* 8.5*  HCT 25.7* 25.4*  PLT 207 216   BMET:  Recent Labs    03/31/20 0331  NA 136  K 3.9  CL 102  CO2 27  GLUCOSE 111*  BUN 14  CREATININE 0.93  CALCIUM 7.9*    PT/INR: No results for input(s): LABPROT, INR in the last 72 hours. ABG    Component Value Date/Time   PHART 7.402 03/30/2020 0340   HCO3 24.9 03/30/2020 0340   TCO2 29 03/29/2020 1135   O2SAT 97.6 03/30/2020 0340   CBG (last 3)  Recent Labs    03/31/20 0807 03/31/20 1132 03/31/20 1644  GLUCAP 185* 85 98    Assessment/Plan: S/P Procedure(s) (LRB): VIDEO BRONCHOSCOPY AND VIDEO ASSISTED XI ROBOTIC THORASCOPY - RIGHT MIDDLE LOBE LUNG LOBECTOMY, WITH INTERCOSTAL NERVE BLOCK AND LYMPH NODE DISSECTION (Right)  1. CV- hemodynamically stable in NSR 2. Pulm- CXR remains  stable to slight improvement of right apical space 3. GI- nausea, zofran prn 4. Dispo- patient stable, nausea main complaint continue zofran prn, will plan for d/c home in the AM   LOS: 4 days    Ellwood Handler, PA-C 04/02/2020  DG Chest 2 View  Result Date: 04/02/2020 CLINICAL DATA:  Postop check EXAM: CHEST - 2 VIEW COMPARISON:  04/01/2020 FINDINGS: Right jugular central venous catheter tip removed. Right apical pneumothorax unchanged approximately 3.5 cm in thickness. Small right effusion unchanged. Mild right lower lobe atelectasis unchanged. Increased subcutaneous gas in the right lateral chest wall. Left lung remains clear. IMPRESSION: Right apical pneumothorax unchanged. Increased subcutaneous gas on the right. Electronically Signed   By: Franchot Gallo M.D.   On: 04/02/2020 08:00   Nausea better, bowels moved Poss home tomorrow , follow up chest xray in the am I have seen and examined [name] and agree with the above assessment  and plan.  Grace Isaac MD [facility] [phone] Office [phone] 04/02/2020 2:52 PM

## 2020-04-02 NOTE — Plan of Care (Signed)

## 2020-04-03 MED ORDER — ONDANSETRON 4 MG PO TBDP: 4.0000 mg | ORAL_TABLET | Freq: Three times a day (TID) | ORAL | 0 refills | Status: DC | PRN

## 2020-04-03 MED ORDER — TRAMADOL HCL 50 MG PO TABS
50.0000 mg | ORAL_TABLET | Freq: Four times a day (QID) | ORAL | 0 refills | Status: DC | PRN
Start: 2020-04-03 — End: 2020-06-09

## 2020-04-03 NOTE — Progress Notes (Signed)
Pt got discharged to home, discharge instructions provided and patient showed understanding to it, IV taken out,Telemonitor DC,pt left unit in wheelchair with all of the belongings accompanied with a family member (granddaughter)  Palma Holter, Therapist, sports

## 2020-04-03 NOTE — Progress Notes (Addendum)
      [address]       ,[place] [address]             [phone]       5 Days Post-Op Procedure(s) (LRB): VIDEO BRONCHOSCOPY AND VIDEO ASSISTED XI ROBOTIC THORASCOPY - RIGHT MIDDLE LOBE LUNG LOBECTOMY, WITH INTERCOSTAL NERVE BLOCK AND LYMPH NODE DISSECTION (Right)  Subjective: Patient states she thinks nausea is because she is taking different antacid and taking scheduled Tylenol. She feels better today and would like to go home.  Objective: Vital signs in last 24 hours: Temp:  [98.2 F (36.8 C)-98.9 F (37.2 C)] 98.9 F (37.2 C) (02/12 1100) Pulse Rate:  [80-99] 80 (02/12 1100) Cardiac Rhythm: Sinus tachycardia (02/11 1900) Resp:  [18] 18 (02/12 1100) BP: (111-131)/(58-73) 131/73 (02/12 1100) SpO2:  [75 %-96 %] 94 % (02/12 1100)      Intake/Output from previous day: No intake/output data recorded.   Physical Exam:  Cardiovascular: RRR Pulmonary: Clear to auscultation bilaterally Abdomen: Soft, non tender, bowel sounds present. Wounds: Clean and dry.  No erythema or signs of infection.   Lab Results: GYK:ZLDJTT Labs    04/01/20 0326  WBC 7.9  HGB 8.5*  HCT 25.4*  PLT 216   BMET: No results for input(s): NA, K, CL, CO2, GLUCOSE, BUN, CREATININE, CALCIUM in the last 72 hours.  PT/INR: No results for input(s): LABPROT, INR in the last 72 hours. ABG:  INR: Will add last result for INR, ABG once components are confirmed Will add last 4 CBG results once components are confirmed  Assessment/Plan:  1. CV - SR 2.  Pulmonary - On room air. Encourage incentive spirometer. 3. Anemia-Last H and H stable at 8.5 and 25.4 4. GI-nausea improved. Will give her Zofran ODT PRN at discharge 5. Discharge  Sharalyn Ink ZimmermanPA-C 04/03/2020,11:41 AM [phone]  I have seen and examined the patient and agree with the assessment and plan as outlined.  D/C home  Rexene Alberts, MD 04/03/2020 12:37 PM

## 2020-04-03 NOTE — Plan of Care (Signed)

## 2020-04-08 ENCOUNTER — Other Ambulatory Visit: Payer: Self-pay | Admitting: *Deleted

## 2020-04-08 NOTE — Progress Notes (Signed)
The proposed treatment discussed in cancer conference 04/08/20 is for discussion purpose only and is not a binding recommendation.  The patient was not physically examined nor present for their treatment options.  Therefore, final treatment plans cannot be decided.

## 2020-04-13 ENCOUNTER — Telehealth: Payer: Self-pay | Admitting: *Deleted

## 2020-04-13 ENCOUNTER — Other Ambulatory Visit: Payer: Self-pay | Admitting: Cardiothoracic Surgery

## 2020-04-13 DIAGNOSIS — N898 Other specified noninflammatory disorders of vagina: Secondary | ICD-10-CM | POA: Diagnosis not present

## 2020-04-13 DIAGNOSIS — Z4689 Encounter for fitting and adjustment of other specified devices: Secondary | ICD-10-CM | POA: Diagnosis not present

## 2020-04-13 DIAGNOSIS — C3491 Malignant neoplasm of unspecified part of right bronchus or lung: Secondary | ICD-10-CM

## 2020-04-13 NOTE — Telephone Encounter (Signed)
A family member called in regards to Ms. Economou. Per family, pt was seen by her GYN today for infected bladder sling. Per family, she has been placed on antibiotics for this. Family concerned that Ms. Kudo is weak and may need fluids. Pt and family state she is trying to drink fluids to stay hydrated. Pt states she has no appetite and hasn't been eating much. Advised pt to increase food intake throughout the day. Family states they have bought her protein shakes, in which I advised she drink one after a meal. Advised pt to continue to drink water or beverage with electrolytes such as Body Armor or Gatorade. Pt states she is still in pain. Pt states she takes a Tramadol when she begins to experience pain. Advised pt to start taking the tramadol more around the clock and not to wait until her pain has become too much to handle. Advised pt to take Tylenol as well in the interim to help ease pain. Pt and family state her incisions are healing well. She has a stitch still in place. Pt has f/u with Dr. Servando Snare scheduled for tomorrow. No further questions at this time.

## 2020-04-14 ENCOUNTER — Encounter: Payer: Self-pay | Admitting: Cardiothoracic Surgery

## 2020-04-14 ENCOUNTER — Ambulatory Visit
Admission: RE | Admit: 2020-04-14 | Discharge: 2020-04-14 | Disposition: A | Discharge status: Home / Self Care | Payer: Medicare Other | Source: Ambulatory Visit | Attending: Surgery | Admitting: Surgery

## 2020-04-14 ENCOUNTER — Other Ambulatory Visit: Payer: Self-pay

## 2020-04-14 ENCOUNTER — Ambulatory Visit (INDEPENDENT_AMBULATORY_CARE_PROVIDER_SITE_OTHER): Payer: Self-pay | Admitting: Cardiothoracic Surgery

## 2020-04-14 VITALS — BP 106/57 | HR 116 | Temp 97.9°F | Resp 20 | Ht 67.0 in | Wt 142.0 lb

## 2020-04-14 DIAGNOSIS — C3491 Malignant neoplasm of unspecified part of right bronchus or lung: Secondary | ICD-10-CM

## 2020-04-14 DIAGNOSIS — J939 Pneumothorax, unspecified: Secondary | ICD-10-CM | POA: Diagnosis not present

## 2020-04-14 DIAGNOSIS — C3490 Malignant neoplasm of unspecified part of unspecified bronchus or lung: Secondary | ICD-10-CM | POA: Diagnosis not present

## 2020-04-14 DIAGNOSIS — J439 Emphysema, unspecified: Secondary | ICD-10-CM | POA: Diagnosis not present

## 2020-04-14 DIAGNOSIS — Z9889 Other specified postprocedural states: Secondary | ICD-10-CM

## 2020-04-14 DIAGNOSIS — J90 Pleural effusion, not elsewhere classified: Secondary | ICD-10-CM | POA: Diagnosis not present

## 2020-04-14 MED ORDER — ONDANSETRON 4 MG PO TBDP: 4.0000 mg | ORAL_TABLET | Freq: Three times a day (TID) | ORAL | 0 refills | Status: DC | PRN

## 2020-04-14 NOTE — Progress Notes (Signed)
[address]       [place] [address]             [phone]      [name] [place] Medical Record #[id number] Date of Birth: 03-06-1950  Referring: Marge Duncans, PA-C Primary Care: Marge Duncans, PA-C Primary Cardiologist: No primary care provider on file.   Chief Complaint:   POST OP FOLLOW UP OPERATIVE REPORT DATE OF PROCEDURE:  03/29/2020 PREOPERATIVE DIAGNOSIS:  Right middle lobe hypermetabolic mass, highly suggestive of malignancy. 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 PROCEDURE PERFORMED:  Bronchoscopy, right robotic thoracoscopy with right middle lobectomy, lymph node dissection, and intercostal nerve block. SURGEON:  Lanelle Bal, MD   Cancer Staging Primary lung cancer, right middle lobe - resected Staging form: Lung, AJCC 8th Edition - Pathologic stage from 03/31/2020: Stage IB (pT2a, pN0, cM0) - Signed by Grace Isaac, MD on 03/31/2020 Histopathologic type: Adenocarcinoma, NOS Stage prefix: Initial diagnosis  History of Present Illness:     Patient returns to the office today after robotic right middle lobectomy on February 7.  She comes to the office today with her granddaughter.  Patient has been walking around the house some, but noted that she has been weak.  Her granddaughter says she has not been eating much.  They have gotten oral dietary supplements for her and encouraged her p.o. intake.  She has had no fever or chills.  No productive cough.     Past Medical History:  Diagnosis Date  . Abnormal chest x-ray 01/19/2020  . Acute laryngopharyngitis 03/18/2020  . Aortic atherosclerosis ([facility]) 01/28/2020  . Arthritis   . Cardiac murmur 03/01/2020  . COPD (chronic obstructive pulmonary disease) ([facility])   . Coronary artery calcification seen on CT scan 03/01/2020  . Cough 01/12/2020  . COVID-19 03/10/2020  . Dyspnea    on exertion,after getting covid  . Family history of adverse reaction to  anesthesia    brothr had n/v  . GERD (gastroesophageal reflux disease)   . Hiatal hernia   . Hyperlipemia   . Pneumonia   . Pre-operative cardiovascular examination 03/01/2020  . 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 Stenosis of left subclavian artery ([facility]) 01/28/2020  . Visit for screening mammogram 01/28/2020     Social History   Tobacco Use  Smoking Status Former Smoker  . Years: 50.00  . Types: Cigarettes  . Quit date: 03/22/2017  . Years since quitting: 3.0  Smokeless Tobacco Never Used    Social History   Substance and Sexual Activity  Alcohol Use Not Currently     Allergies  Allergen Reactions  . Codeine Hives  . Surgical Lubricant Hives    Current Outpatient Medications  Medication Sig Dispense Refill  . acetaminophen (TYLENOL) 500 MG tablet Take 500 mg by mouth every 6 (six) hours as needed for mild pain.    . Ascorbic Acid (VITAMIN C PO) Take 3 tablets by mouth daily.    . Coenzyme Q10 (CO Q-10 PO) Take 2 tablets by mouth daily.    Marland Kitchen ibuprofen (ADVIL) 200 MG tablet Take 200 mg by mouth every 6 (six) hours as needed for mild pain.    . Multiple Vitamins-Minerals (MULTI FOR HER 50+ PO) Take 2 tablets by mouth daily.    Marland Kitchen omeprazole (PRILOSEC) 20 MG capsule Take 1 capsule (20 mg total) by mouth daily. (Patient taking differently: Take 20 mg by mouth 2 (two) times daily before  a meal.) 90 capsule 3  . rosuvastatin (CRESTOR) 5 MG tablet Take 1 tablet (5 mg total) by mouth daily. (Patient taking differently: Take 5 mg by mouth daily with supper.) 30 tablet 2  . traMADol (ULTRAM) 50 MG tablet Take 1 tablet (50 mg total) by mouth every 6 (six) hours as needed for severe pain (mild pain). 30 tablet 0  . VITAMIN D PO Take 2 tablets by mouth daily.    . ondansetron (ZOFRAN ODT) 4 MG disintegrating tablet Take 1 tablet (4 mg total) by mouth every 8 (eight) hours as needed for nausea or vomiting. 10 tablet 0   No current facility-administered medications for this visit.        Physical Exam: BP (!) 106/57 (BP Location: Left Arm, Patient Position: Sitting)   Pulse (!) 116   Temp 97.9 F (36.6 C)   Resp 20   Ht 5\' 7"  (1.702 m)   Wt 142 lb (64.4 kg)   SpO2 96%   BMI 22.24 kg/m   General appearance: alert and cooperative Neurologic: intact Heart: regular rate and rhythm, S1, S2 normal, no murmur, click, rub or gallop Lungs: diminished breath sounds RLL Abdomen: soft, non-tender; bowel sounds normal; no masses,  no organomegaly Extremities: extremities normal, atraumatic, no cyanosis or edema and Homans sign is negative, no sign of DVT Wound: Robotic port sites are all healing well including the chest tube site   Diagnostic Studies & Laboratory data:     Recent Radiology Findings:   DG Chest 2 View  Result Date: 04/14/2020 CLINICAL DATA:  Lung cancer EXAM: CHEST - 2 VIEW COMPARISON:  04/01/2020, CT 01/23/2000, radiograph 03/31/2020, 03/29/2020 FINDINGS: Left lung is grossly clear. Decreased right chest wall emphysema. Residual right apical pneumothorax, decreased compared to prior radiograph, demonstrating 2.4 cm pleural-parenchymal separation at the apex compared to 3.5 cm previously. Residual right pleural effusion with fluid along the fissure on lateral view. Mild airspace disease at the right base without change. Stable cardiomediastinal silhouette. IMPRESSION: Residual small right apical pneumothorax, decreased compared to prior radiograph. Residual right pleural effusion and right basilar airspace disease without significant change. Decreased right chest wall emphysema. Electronically Signed   By: Donavan Foil M.D.   On: 04/14/2020 15:52    I have independently reviewed the above radiology studies  and reviewed the findings with the patient.    Recent Lab Findings: Lab Results  Component Value Date   WBC 7.9 04/01/2020   HGB 8.5 (L) 04/01/2020   HCT 25.4 (L) 04/01/2020   PLT 216 04/01/2020   GLUCOSE 111 (H) 03/31/2020   ALT 16  03/31/2020   AST 21 03/31/2020   NA 136 03/31/2020   K 3.9 03/31/2020   CL 102 03/31/2020   CREATININE 0.93 03/31/2020   BUN 14 03/31/2020   CO2 27 03/31/2020   INR 1.0 03/25/2020      Assessment / Plan:   Status post right robotic middle lobectomy for stage I adenocarcinoma-patient is making progress postoperatively but slowly, has poor appetite and activity level.  She was encouraged to increase her activity, work on p.o. intake with dietary supplements such as boost or Ensure.  Chest x-ray shows decreasing apical pneumothorax-this is improved  Plan to see her back in 2 weeks with follow-up chest x-ray   Medication Changes: Meds ordered this encounter  Medications  . ondansetron (ZOFRAN ODT) 4 MG disintegrating tablet    Sig: Take 1 tablet (4 mg total) by mouth every 8 (eight) hours as needed  for nausea or vomiting.    Dispense:  10 tablet    Refill:  0      Grace Isaac MD      [address] [place] [address] Office [phone]     04/15/2020 9:06 AM

## 2020-04-19 ENCOUNTER — Encounter: Payer: Self-pay | Admitting: Physician Assistant

## 2020-04-19 ENCOUNTER — Other Ambulatory Visit: Payer: Self-pay | Admitting: Physician Assistant

## 2020-04-19 DIAGNOSIS — I700 Atherosclerosis of aorta: Secondary | ICD-10-CM

## 2020-04-27 ENCOUNTER — Other Ambulatory Visit: Payer: Self-pay | Admitting: Cardiothoracic Surgery

## 2020-04-27 DIAGNOSIS — C3491 Malignant neoplasm of unspecified part of right bronchus or lung: Secondary | ICD-10-CM

## 2020-04-29 ENCOUNTER — Other Ambulatory Visit: Payer: Self-pay

## 2020-04-29 ENCOUNTER — Ambulatory Visit
Admission: RE | Admit: 2020-04-29 | Discharge: 2020-04-29 | Disposition: A | Discharge status: Home / Self Care | Payer: Medicare Other | Source: Ambulatory Visit | Attending: Cardiothoracic Surgery | Admitting: Cardiothoracic Surgery

## 2020-04-29 ENCOUNTER — Encounter: Payer: Self-pay | Admitting: Cardiothoracic Surgery

## 2020-04-29 ENCOUNTER — Ambulatory Visit (INDEPENDENT_AMBULATORY_CARE_PROVIDER_SITE_OTHER): Payer: Self-pay | Admitting: Cardiothoracic Surgery

## 2020-04-29 VITALS — BP 136/82 | HR 98 | Resp 20 | Wt 139.4 lb

## 2020-04-29 DIAGNOSIS — Z9889 Other specified postprocedural states: Secondary | ICD-10-CM

## 2020-04-29 DIAGNOSIS — C3491 Malignant neoplasm of unspecified part of right bronchus or lung: Secondary | ICD-10-CM

## 2020-04-29 DIAGNOSIS — J90 Pleural effusion, not elsewhere classified: Secondary | ICD-10-CM | POA: Diagnosis not present

## 2020-04-29 NOTE — Progress Notes (Signed)
[address]       [place] [address]             [phone]      [facility] Laurie Anna Medical Record #[id number] Date of Birth: 1950-04-14  Referring: Marge Duncans, PA-C Primary Care: Marge Duncans, PA-C Primary Cardiologist: No primary care provider on file.   Chief Complaint:   POST OP FOLLOW UP OPERATIVE REPORT DATE OF PROCEDURE:  03/29/2020 PREOPERATIVE DIAGNOSIS:  Right middle lobe hypermetabolic mass, highly suggestive of malignancy. 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 PROCEDURE PERFORMED:  Bronchoscopy, right robotic thoracoscopy with right middle lobectomy, lymph node dissection, and intercostal nerve block. SURGEON:  Lanelle Bal, MD   Cancer Staging Primary lung cancer, right middle lobe - resected Staging form: Lung, AJCC 8th Edition - Pathologic stage from 03/31/2020: Stage IB (pT2a, pN0, cM0) - Signed by Grace Isaac, MD on 03/31/2020 Histopathologic type: Adenocarcinoma, NOS Stage prefix: Initial diagnosis  History of Present Illness:     Patient returns to the office today after robotic right middle lobectomy on February 7.  She notes since her last visit to the office.  She is doing much better.  She has had return of appetite, her nausea has resolved.  Her granddaughter notes that she has been gaining strength and has been much more active over the past 2 weeks.  When she was first seen preoperatively she had not been vaccinated, she was strongly encouraged to do so.  She got the first vaccination and then just before her planned surgical resection developed COVID, she notes that after the appropriate time.  She will continue with her vaccination protocol   Past Medical History:  Diagnosis Date  . Abnormal chest x-ray 01/19/2020  . Acute laryngopharyngitis 03/18/2020  . Aortic atherosclerosis ([place]) 01/28/2020  . Arthritis   . Cardiac murmur 03/01/2020  . COPD (chronic obstructive pulmonary  disease) ([place])   . Coronary artery calcification seen on CT scan 03/01/2020  . Cough 01/12/2020  . COVID-19 03/10/2020  . Dyspnea    on exertion,after getting covid  . Family history of adverse reaction to anesthesia    brothr had n/v  . GERD (gastroesophageal reflux disease)   . Hiatal hernia   . Hyperlipemia   . Pneumonia   . Pre-operative cardiovascular examination 03/01/2020  . 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 Visit for screening mammogram 01/28/2020     Social History   Tobacco Use  Smoking Status Former Smoker  . Years: 50.00  . Types: Cigarettes  . Quit date: 03/22/2017  . Years since quitting: 3.1  Smokeless Tobacco Never Used    Social History   Substance and Sexual Activity  Alcohol Use Not Currently     Allergies  Allergen Reactions  . Codeine Hives  . Surgical Lubricant Hives    Current Outpatient Medications  Medication Sig Dispense Refill  . acetaminophen (TYLENOL) 500 MG tablet Take 500 mg by mouth every 6 (six) hours as needed for mild pain.    . Ascorbic Acid (VITAMIN C PO) Take 3 tablets by mouth daily.    . Coenzyme Q10 (CO Q-10 PO) Take 2 tablets by mouth daily.    Marland Kitchen ibuprofen (ADVIL) 200 MG tablet Take 200 mg by mouth every 6 (six) hours as needed for mild pain.    . Multiple Vitamins-Minerals (MULTI FOR HER 50+ PO) Take 2 tablets  by mouth daily.    Marland Kitchen omeprazole (PRILOSEC) 20 MG capsule Take 1 capsule (20 mg total) by mouth daily. (Patient taking differently: Take 20 mg by mouth 2 (two) times daily before a meal.) 90 capsule 3  . ondansetron (ZOFRAN ODT) 4 MG disintegrating tablet Take 1 tablet (4 mg total) by mouth every 8 (eight) hours as needed for nausea or vomiting. 10 tablet 0  . rosuvastatin (CRESTOR) 5 MG tablet TAKE 1 TABLET BY MOUTH EVERY DAY 90 tablet 0  . traMADol (ULTRAM) 50 MG tablet Take 1 tablet (50 mg total) by mouth every 6 (six) hours as needed for severe pain (mild  pain). 30 tablet 0  . VITAMIN D PO Take 2 tablets by mouth daily.     No current facility-administered medications for this visit.       Physical Exam: BP 136/82 (BP Location: Left Arm, Patient Position: Sitting, Cuff Size: Normal)   Pulse 98   Resp 20   Wt 139 lb 6.4 oz (63.2 kg)   SpO2 94% Comment: RA  BMI 21.83 kg/m   General appearance: alert and no distress Head: Normocephalic, without obvious abnormality, atraumatic Neck: no adenopathy, no carotid bruit, no JVD, supple, symmetrical, trachea midline and thyroid not enlarged, symmetric, no tenderness/mass/nodules Lymph nodes: Cervical, supraclavicular, and axillary nodes normal. Resp: clear to auscultation bilaterally Back: symmetric, no curvature. ROM normal. No CVA tenderness. Cardio: regular rate and rhythm, S1, S2 normal, no murmur, click, rub or gallop GI: soft, non-tender; bowel sounds normal; no masses,  no organomegaly Extremities: extremities normal, atraumatic, no cyanosis or edema Neurologic: Grossly normal  Diagnostic Studies & Laboratory data:     Recent Radiology Findings:  DG Chest 2 View  Result Date: 04/29/2020 CLINICAL DATA:  History of lung cancer.  Pneumothorax. EXAM: CHEST - 2 VIEW COMPARISON:  04/14/2020 FINDINGS: Normal heart size. No shift of the heart or mediastinal structures. Volume loss within the right lung compatible with partial right lung resection. Continued decrease in size of small right apical pneumothorax. Small right pleural effusion, similar to prior. Left lung is hyperexpanded. No focal airspace consolidation. No left-sided pleural effusion or left-sided pneumothorax. IMPRESSION: 1. Continued decrease in size of small right apical pneumothorax. 2. Small right pleural effusion, similar to prior. Electronically Signed   By: Davina Poke D.O.   On: 04/29/2020 13:18   DG Chest 2 View  Result Date: 04/14/2020 CLINICAL DATA:  Lung cancer EXAM: CHEST - 2 VIEW COMPARISON:  04/01/2020, CT  01/23/2000, radiograph 03/31/2020, 03/29/2020 FINDINGS: Left lung is grossly clear. Decreased right chest wall emphysema. Residual right apical pneumothorax, decreased compared to prior radiograph, demonstrating 2.4 cm pleural-parenchymal separation at the apex compared to 3.5 cm previously. Residual right pleural effusion with fluid along the fissure on lateral view. Mild airspace disease at the right base without change. Stable cardiomediastinal silhouette. IMPRESSION: Residual small right apical pneumothorax, decreased compared to prior radiograph. Residual right pleural effusion and right basilar airspace disease without significant change. Decreased right chest wall emphysema. Electronically Signed   By: Donavan Foil M.D.   On: 04/14/2020 15:52     I have independently reviewed the above radiology studies  and reviewed the findings with the patient.    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 #2 Covid infection which delayed her surgical resection-she does plan to get a follow-up vaccination at the appropriate time following resolution of her Covid infection.  We will plan follow-up CT of the chest-6 months postop August 2022     Medication Changes: No orders of the defined types were placed in this encounter.     Grace Isaac MD      [address] [place]  [address] Office [phone]     05/03/2020 2:20 PM

## 2020-05-04 ENCOUNTER — Other Ambulatory Visit: Payer: Self-pay

## 2020-05-05 MED ORDER — OMEPRAZOLE 20 MG PO CPDR: 20.0000 mg | DELAYED_RELEASE_CAPSULE | Freq: Two times a day (BID) | ORAL | 1 refills | Status: DC

## 2020-06-09 ENCOUNTER — Ambulatory Visit: Payer: Medicare Other

## 2020-06-09 ENCOUNTER — Other Ambulatory Visit: Payer: Self-pay

## 2020-06-09 ENCOUNTER — Ambulatory Visit (INDEPENDENT_AMBULATORY_CARE_PROVIDER_SITE_OTHER): Payer: Medicare Other | Admitting: Physician Assistant

## 2020-06-09 ENCOUNTER — Encounter: Payer: Self-pay | Admitting: Physician Assistant

## 2020-06-09 VITALS — BP 120/78 | HR 75 | Temp 96.7°F | Ht 68.0 in | Wt 143.0 lb

## 2020-06-09 DIAGNOSIS — Z1152 Encounter for screening for COVID-19: Secondary | ICD-10-CM

## 2020-06-09 DIAGNOSIS — Z0000 Encounter for general adult medical examination without abnormal findings: Secondary | ICD-10-CM | POA: Diagnosis not present

## 2020-06-09 DIAGNOSIS — R5383 Other fatigue: Secondary | ICD-10-CM | POA: Diagnosis not present

## 2020-06-09 DIAGNOSIS — E782 Mixed hyperlipidemia: Secondary | ICD-10-CM

## 2020-06-09 MED ORDER — MONTELUKAST SODIUM 10 MG PO TABS: 10.0000 mg | ORAL_TABLET | Freq: Every day | ORAL | 5 refills | Status: DC

## 2020-06-09 NOTE — Progress Notes (Signed)
Subjective:  Patient ID: [name], female    DOB: 1950/06/24  Age: 70 y.o. MRN: [id number]  Chief Complaint  Patient presents with  . Annual Exam    HPI Encounter for general adult medical examination without abnormal findings  Physical ("At Risk" items are starred): Patient's last physical exam was 1 year ago .  Smoking: former smoker ;  Physical Activity: does not exercise at least 3 times per week ;  Alcohol/Drug Use: Is a non-drinker ; No illicit drug use ;  Patient is not afflicted from Stress Incontinence and Urge Incontinence  Safety: reviewed ; Patient wears a seat belt, has smoke detectors, has carbon monoxide detectors, practices appropriate gun safety, and wears sunscreen with extended sun exposure. Dental Care: biannual cleanings, brushes and flosses daily. Ophthalmology/Optometry: Annual visit.  Hearing loss: none Vision impairments: Glasses  DEXA- Due - wants at end of year Colonoscopy-Due but declined Mammogram-Due - wants at end of year  Fall Risk  06/09/2020 01/10/2019 09/11/2016  Falls in the past year? 0 1 No  Comment - Emmi Telephone Survey: data to providers prior to load Emmi Telephone Survey: data to providers prior to load  Number falls in past yr: 0 1 -  Comment - Emmi Telephone Survey Actual Response = 1 -  Injury with Fall? 0 1 -  Risk for fall due to : No Fall Risks - -  Follow up Falls evaluation completed - -     Depression screen [facility] 2/9 06/09/2020  Decreased Interest 0  Down, Depressed, Hopeless 0  PHQ - 2 Score 0       Functional Status Survey: Is the patient deaf or have difficulty hearing?: No Does the patient have difficulty seeing, even when wearing glasses/contacts?: No Does the patient have difficulty concentrating, remembering, or making decisions?: No Does the patient have difficulty walking or climbing stairs?: No Does the patient have difficulty dressing or bathing?: No Does the patient have difficulty doing errands alone  such as visiting a doctor's office or shopping?: No   Social Hx   Social History   Socioeconomic History  . Marital status: Widowed    Spouse name: Not on file  . Number of children: Not on file  . Years of education: Not on file  . Highest education level: Not on file  Occupational History  . Not on file  Tobacco Use  . Smoking status: Former Smoker    Years: 50.00    Types: Cigarettes    Quit date: 03/22/2017    Years since quitting: 3.2  . Smokeless tobacco: Never Used  Vaping Use  . Vaping Use: Never used  Substance and Sexual Activity  . Alcohol use: Not Currently  . Drug use: Never  . Sexual activity: Not Currently  Other Topics Concern  . Not on file  Social History Narrative  . Not on file   Social Determinants of Health   Financial Resource Strain: Not on file  Food Insecurity: Not on file  Transportation Needs: Not on file  Physical Activity: Not on file  Stress: Not on file  Social Connections: Not on file   Past Medical History:  Diagnosis Date  . Abnormal chest x-ray 01/19/2020  . Acute laryngopharyngitis 03/18/2020  . Aortic atherosclerosis ([place]) 01/28/2020  . Arthritis   . Cardiac murmur 03/01/2020  . COPD (chronic obstructive pulmonary disease) ([place])   . Coronary artery calcification seen on CT scan 03/01/2020  . Cough 01/12/2020  . COVID-19 03/10/2020  . Dyspnea  on exertion,after getting covid  . Family history of adverse reaction to anesthesia    brothr had n/v  . GERD (gastroesophageal reflux disease)   . Hiatal hernia   . Hyperlipemia   . Pneumonia   . Pre-operative cardiovascular examination 03/01/2020  . 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 Visit for screening mammogram 01/28/2020   Family History  Problem Relation Age of Onset  . Heart disease Mother   . Heart attack Mother   . Cancer Father   . Diabetes Father   . Non-Hodgkin's lymphoma Brother   . High Cholesterol  Brother   . High Cholesterol Brother   . Heart disease Brother   . Heart attack Brother     Review of Systems CONSTITUTIONAL: Negative for chills, fatigue, fever, unintentional weight gain and unintentional weight loss.  E/N/T: Negative for ear pain, nasal congestion and sore throat.  CARDIOVASCULAR: Negative for chest pain, dizziness, palpitations and pedal edema.  RESPIRATORY: Negative for recent cough and dyspnea. - pt recently had lung resection for lung cancer GASTROINTESTINAL: Negative for abdominal pain, acid reflux symptoms, constipation, diarrhea, nausea and vomiting.  MSK: Negative for arthralgias and myalgias.  INTEGUMENTARY: Negative for rash.  NEUROLOGICAL: Negative for dizziness and headaches.  PSYCHIATRIC: Negative for sleep disturbance and to question depression screen.  Negative for depression, negative for anhedonia.       Objective:  BP 120/78   Pulse 75   Temp (!) 96.7 F (35.9 C)   Ht 5\' 8"  (1.727 m)   Wt 143 lb (64.9 kg)   SpO2 99%   BMI 21.74 kg/m   BP/Weight 06/09/2020 04/29/2020 2/53/6644  Systolic BP 034 742 595  Diastolic BP 78 82 57  Wt. (Lbs) 143 139.4 142  BMI 21.74 21.83 22.24    Physical Exam PHYSICAL EXAM:   VS: BP 120/78   Pulse 75   Temp (!) 96.7 F (35.9 C)   Ht 5\' 8"  (1.727 m)   Wt 143 lb (64.9 kg)   SpO2 99%   BMI 21.74 kg/m   GEN: Well nourished, well developed, in no acute distress  HEENT: normal external ears and nose - normal external auditory canals and TMS - hearing grossly normal - normal nasal mucosa and septum - Lips, Teeth and Gums - normal  Oropharynx - normal mucosa, palate, and posterior pharynx Neck: no JVD or masses - no thyromegaly Cardiac: RRR; no murmurs, rubs, or gallops,no edema - no significant varicosities Respiratory:  normal respiratory rate and pattern with no distress - normal breath sounds with no rales, rhonchi, wheezes or rubs GI: normal bowel sounds, no masses or tenderness MS: no deformity or  atrophy  Skin: warm and dry, no rash  Neuro:  Alert and Oriented x 3, Strength and sensation are intact - CN II-Xii grossly intact Psych: euthymic mood, appropriate affect and demeanor  Lab Results  Component Value Date   WBC 7.9 04/01/2020   HGB 8.5 (L) 04/01/2020   HCT 25.4 (L) 04/01/2020   PLT 216 04/01/2020   GLUCOSE 111 (H) 03/31/2020   ALT 16 03/31/2020   AST 21 03/31/2020   NA 136 03/31/2020   K 3.9 03/31/2020   CL 102 03/31/2020   CREATININE 0.93 03/31/2020   BUN 14 03/31/2020   CO2 27 03/31/2020   INR 1.0 03/25/2020      Assessment & Plan:  1. Annual physical exam Continue with healthy lifestyle 2. Encounter for screening  for COVID-19 - SARS-CoV-2 Antibody, IgM Pt with history of COVID and at pt request wants this test done 3. Mixed hyperlipidemia - Lipid panel Continue meds 4. Other fatigue - CBC with Differential/Platelet - Comprehensive metabolic panel - TSH    Meds ordered this encounter  Medications  . montelukast (SINGULAIR) 10 MG tablet    Sig: Take 1 tablet (10 mg total) by mouth at bedtime.    Dispense:  30 tablet    Refill:  5    Order Specific Question:   Supervising Provider    AnswerRochel Brome [phone]    These are the goals we discussed: Goals   None      This is a list of the screening recommended for you and due dates:  Health Maintenance  Topic Date Due  .  Hepatitis C: One time screening is recommended by Center for Disease Control  (CDC) for  adults born from [birth year] through 1967.   Never done  . Pneumonia vaccines (2 of 2 - PPSV23) 02/19/2019  . COVID-19 Vaccine (2 - Pfizer risk 4-dose series) 06/25/2020*  . DEXA scan (bone density measurement)  06/09/2021*  . Flu Shot  09/20/2020  . Mammogram  01/19/2021  . Colon Cancer Screening  01/19/2027  . Tetanus Vaccine  05/10/2027  . HPV Vaccine  Aged Out  *Topic was postponed. The date shown is not the original due date.     AN INDIVIDUALIZED CARE PLAN: was established or  reinforced today.   SELF MANAGEMENT: The patient and I together assessed ways to personally work towards obtaining the recommended goals  Support needs The patient and/or family needs were assessed and services were offered and not necessary at this time.    Follow-up: Return in about 6 months (around 12/09/2020) for chronic fasting follow up.  Yetta Flock [facility] [phone]

## 2020-06-09 NOTE — Addendum Note (Signed)
Addended by: Marge Duncans on: 06/09/2020 11:00 AM   Modules accepted: Orders

## 2020-06-09 NOTE — Patient Instructions (Signed)
Preventive Care 72 Years and Older, Female Preventive care refers to lifestyle choices and visits with your health care provider that can promote health and wellness. This includes:  A yearly physical exam. This is also called an annual wellness visit.  Regular dental and eye exams.  Immunizations.  Screening for certain conditions.  Healthy lifestyle choices, such as: ? Eating a healthy diet. ? Getting regular exercise. ? Not using drugs or products that contain nicotine and tobacco. ? Limiting alcohol use. What can I expect for my preventive care visit? Physical exam Your health care provider will check your:  Height and weight. These may be used to calculate your BMI (body mass index). BMI is a measurement that tells if you are at a healthy weight.  Heart rate and blood pressure.  Body temperature.  Skin for abnormal spots. Counseling Your health care provider may ask you questions about your:  Past medical problems.  Family's medical history.  Alcohol, tobacco, and drug use.  Emotional well-being.  Home life and relationship well-being.  Sexual activity.  Diet, exercise, and sleep habits.  History of falls.  Memory and ability to understand (cognition).  Work and work Statistician.  Pregnancy and menstrual history.  Access to firearms. What immunizations do I need? Vaccines are usually given at various ages, according to a schedule. Your health care provider will recommend vaccines for you based on your age, medical history, and lifestyle or other factors, such as travel or where you work.   What tests do I need? Blood tests  Lipid and cholesterol levels. These may be checked every 5 years, or more often depending on your overall health.  Hepatitis C test.  Hepatitis B test. Screening  Lung cancer screening. You may have this screening every year starting at age 72 if you have a 30-pack-year history of smoking and currently smoke or have quit within  the past 15 years.  Colorectal cancer screening. ? All adults should have this screening starting at age 72 and continuing until age 72. ? Your health care provider may recommend screening at age 72 if you are at increased risk. ? You will have tests every 1-10 years, depending on your results and the type of screening test.  Diabetes screening. ? This is done by checking your blood sugar (glucose) after you have not eaten for a while (fasting). ? You may have this done every 1-3 years.  Mammogram. ? This may be done every 1-2 years. ? Talk with your health care provider about how often you should have regular mammograms.  Abdominal aortic aneurysm (AAA) screening. You may need this if you are a current or former smoker.  BRCA-related cancer screening. This may be done if you have a family history of breast, ovarian, tubal, or peritoneal cancers. Other tests  STD (sexually transmitted disease) testing, if you are at risk.  Bone density scan. This is done to screen for osteoporosis. You may have this done starting at age 72. Talk with your health care provider about your test results, treatment options, and if necessary, the need for more tests. Follow these instructions at home: Eating and drinking  Eat a diet that includes fresh fruits and vegetables, whole grains, lean protein, and low-fat dairy products. Limit your intake of foods with high amounts of sugar, saturated fats, and salt.  Take vitamin and mineral supplements as recommended by your health care provider.  Do not drink alcohol if your health care provider tells you not to drink.  If you drink alcohol: ? Limit how much you have to 0-1 drink a day. ? Be aware of how much alcohol is in your drink. In the [place] one drink equals one 12 oz bottle of beer (355 mL), one 5 oz glass of wine (148 mL), or one 1 oz glass of hard liquor (44 mL).   Lifestyle  Take daily care of your teeth and gums. Brush your teeth every morning  and night with fluoride toothpaste. Floss one time each day.  Stay active. Exercise for at least 30 minutes 5 or more days each week.  Do not use any products that contain nicotine or tobacco, such as cigarettes, e-cigarettes, and chewing tobacco. If you need help quitting, ask your health care provider.  Do not use drugs.  If you are sexually active, practice safe sex. Use a condom or other form of protection in order to prevent STIs (sexually transmitted infections).  Talk with your health care provider about taking a low-dose aspirin or statin.  Find healthy ways to cope with stress, such as: ? Meditation, yoga, or listening to music. ? Journaling. ? Talking to a trusted person. ? Spending time with friends and family. Safety  Always wear your seat belt while driving or riding in a vehicle.  Do not drive: ? If you have been drinking alcohol. Do not ride with someone who has been drinking. ? When you are tired or distracted. ? While texting.  Wear a helmet and other protective equipment during sports activities.  If you have firearms in your house, make sure you follow all gun safety procedures. What's next?  Visit your health care provider once a year for an annual wellness visit.  Ask your health care provider how often you should have your eyes and teeth checked.  Stay up to date on all vaccines. This information is not intended to replace advice given to you by your health care provider. Make sure you discuss any questions you have with your health care provider. Document Revised: 01/28/2020 Document Reviewed: 01/31/2018 Elsevier Patient Education  2021 [employer]  

## 2020-06-10 LAB — COMPREHENSIVE METABOLIC PANEL
ALT: 20 IU/L (ref 0–32)
AST: 23 IU/L (ref 0–40)
Albumin/Globulin Ratio: 1.6 (ref 1.2–2.2)
Albumin: 4.2 g/dL (ref 3.7–4.7)
Alkaline Phosphatase: 80 IU/L (ref 44–121)
BUN/Creatinine Ratio: 12 (ref 12–28)
BUN: 12 mg/dL (ref 8–27)
Bilirubin Total: 0.3 mg/dL (ref 0.0–1.2)
CO2: 23 mmol/L (ref 20–29)
Calcium: 9 mg/dL (ref 8.7–10.3)
Chloride: 100 mmol/L (ref 96–106)
Creatinine, Ser: 1 mg/dL (ref 0.57–1.00)
Globulin, Total: 2.7 g/dL (ref 1.5–4.5)
Glucose: 95 mg/dL (ref 65–99)
Potassium: 4.5 mmol/L (ref 3.5–5.2)
Sodium: 140 mmol/L (ref 134–144)
Total Protein: 6.9 g/dL (ref 6.0–8.5)
eGFR: 60 mL/min/{1.73_m2} (ref >59)

## 2020-06-10 LAB — CBC WITH DIFFERENTIAL/PLATELET
Basophils Absolute: 0 10*3/uL (ref 0.0–0.2)
Basos: 0 %
EOS (ABSOLUTE): 0.2 10*3/uL (ref 0.0–0.4)
Eos: 3 %
Hematocrit: 37.4 % (ref 34.0–46.6)
Hemoglobin: 11.6 g/dL (ref 11.1–15.9)
Immature Grans (Abs): 0 10*3/uL (ref 0.0–0.1)
Immature Granulocytes: 0 %
Lymphocytes Absolute: 1.8 10*3/uL (ref 0.7–3.1)
Lymphs: 25 %
MCH: 24.2 pg — ABNORMAL LOW (ref 26.6–33.0)
MCHC: 31 g/dL — ABNORMAL LOW (ref 31.5–35.7)
MCV: 78 fL — ABNORMAL LOW (ref 79–97)
Monocytes Absolute: 0.7 10*3/uL (ref 0.1–0.9)
Monocytes: 10 %
Neutrophils Absolute: 4.6 10*3/uL (ref 1.4–7.0)
Neutrophils: 62 %
Platelets: 387 10*3/uL (ref 150–450)
RBC: 4.79 x10E6/uL (ref 3.77–5.28)
RDW: 13.6 % (ref 11.7–15.4)
WBC: 7.3 10*3/uL (ref 3.4–10.8)

## 2020-06-10 LAB — LIPID PANEL
Chol/HDL Ratio: 6.5 ratio — ABNORMAL HIGH (ref 0.0–4.4)
Cholesterol, Total: 267 mg/dL — ABNORMAL HIGH (ref 100–199)
HDL: 41 mg/dL (ref >39)
LDL Chol Calc (NIH): 183 mg/dL — ABNORMAL HIGH (ref 0–99)
Triglycerides: 224 mg/dL — ABNORMAL HIGH (ref 0–149)
VLDL Cholesterol Cal: 43 mg/dL — ABNORMAL HIGH (ref 5–40)

## 2020-06-10 LAB — CARDIOVASCULAR RISK ASSESSMENT

## 2020-06-10 LAB — TSH: TSH: 3.83 u[IU]/mL (ref 0.450–4.500)

## 2020-06-10 LAB — SAR COV2 SEROLOGY (COVID19)AB(IGG),IA
SARS-CoV-2 Semi-Quant IgG Ab: 216 AU/mL (ref <13.0)
SARS-CoV-2 Spike Ab Interp: POSITIVE

## 2020-06-22 DIAGNOSIS — Z4689 Encounter for fitting and adjustment of other specified devices: Secondary | ICD-10-CM | POA: Diagnosis not present

## 2020-07-13 ENCOUNTER — Other Ambulatory Visit: Payer: Self-pay

## 2020-07-13 ENCOUNTER — Ambulatory Visit (INDEPENDENT_AMBULATORY_CARE_PROVIDER_SITE_OTHER): Payer: Medicare Other | Admitting: Family Medicine

## 2020-07-13 ENCOUNTER — Encounter: Payer: Self-pay | Admitting: Family Medicine

## 2020-07-13 VITALS — BP 114/68 | HR 92 | Temp 96.4°F | Resp 18 | Ht 67.0 in | Wt 149.6 lb

## 2020-07-13 DIAGNOSIS — R059 Cough, unspecified: Secondary | ICD-10-CM

## 2020-07-13 DIAGNOSIS — J302 Other seasonal allergic rhinitis: Secondary | ICD-10-CM

## 2020-07-13 LAB — POC COVID19 BINAXNOW: SARS Coronavirus 2 Ag: NEGATIVE

## 2020-07-13 NOTE — Progress Notes (Signed)
Acute Office Visit  Subjective:    Patient ID: [name], female    DOB: [birth year], 70 y.o.   MRN: [id number]  Chief Complaint  Patient presents with  . Sinusitis    HPI Patient is in today for sinus issues, states she has not felt well for the past 2 days. C/o feeling fatigue, congestion, coughing and sob. Pt was tested in parking lot for covid 19 rapid test and was negative.   Past Medical History:  Diagnosis Date  . Abnormal chest x-ray 01/19/2020  . Acute laryngopharyngitis 03/18/2020  . Aortic atherosclerosis ([place]) 01/28/2020  . Arthritis   . Cardiac murmur 03/01/2020  . COPD (chronic obstructive pulmonary disease) ([place])   . Coronary artery calcification seen on CT scan 03/01/2020  . Cough 01/12/2020  . COVID-19 03/10/2020  . Dyspnea    on exertion,after getting covid  . Family history of adverse reaction to anesthesia    brothr had n/v  . GERD (gastroesophageal reflux disease)   . Hiatal hernia   . Hyperlipemia   . Pneumonia   . Pre-operative cardiovascular examination 03/01/2020  . 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 Visit for screening mammogram 01/28/2020    No past surgical history on file.  Family History  Problem Relation Age of Onset  . Heart disease Mother   . Heart attack Mother   . Cancer Father   . Diabetes Father   . Non-Hodgkin's lymphoma Brother   . High Cholesterol Brother   . High Cholesterol Brother   . Heart disease Brother   . Heart attack Brother     Social History   Socioeconomic History  . Marital status: Widowed    Spouse name: Not on file  . Number of children: Not on file  . Years of education: Not on file  . Highest education level: Not on file  Occupational History  . Not on file  Tobacco Use  . Smoking status: Former Smoker    Years: 50.00    Types: Cigarettes    Quit date: 03/22/2017    Years since quitting: 3.3  . Smokeless tobacco: Never Used  Vaping Use   . Vaping Use: Never used  Substance and Sexual Activity  . Alcohol use: Not Currently  . Drug use: Never  . Sexual activity: Not Currently  Other Topics Concern  . Not on file  Social History Narrative  . Not on file   Social Determinants of Health   Financial Resource Strain: Not on file  Food Insecurity: Not on file  Transportation Needs: Not on file  Physical Activity: Not on file  Stress: Not on file  Social Connections: Not on file  Intimate Partner Violence: Not on file    Outpatient Medications Prior to Visit  Medication Sig Dispense Refill  . Ascorbic Acid (VITAMIN C PO) Take 3 tablets by mouth daily.    . Coenzyme Q10 (CO Q-10 PO) Take 2 tablets by mouth daily.    . montelukast (SINGULAIR) 10 MG tablet Take 1 tablet (10 mg total) by mouth at bedtime. 30 tablet 5  . Multiple Vitamin (MULTIVITAMIN ADULT PO) Take 2 tablets by mouth daily.    . Multiple Vitamins-Minerals (VITAMIN D3 COMPLETE PO) Take by mouth.    Marland Kitchen omeprazole (PRILOSEC) 20 MG capsule Take 1 capsule (20 mg total) by mouth 2 (two) times daily before a meal. 180 capsule 1  . rosuvastatin (CRESTOR) 5 MG tablet TAKE  1 TABLET BY MOUTH EVERY DAY 90 tablet 0  . VITAMIN D PO Take 2 tablets by mouth daily.     No facility-administered medications prior to visit.    Allergies  Allergen Reactions  . Codeine Hives  . Surgical Lubricant Hives    Review of Systems  Constitutional: Positive for fatigue. Negative for chills and fever.  HENT: Positive for congestion, rhinorrhea and sore throat.   Eyes: Positive for pain (Burning).  Respiratory: Positive for cough and shortness of breath.   Cardiovascular: Negative for chest pain.  Gastrointestinal: Positive for abdominal pain. Negative for constipation, diarrhea, nausea and vomiting.  Genitourinary: Positive for dysuria. Negative for urgency.  Musculoskeletal: Positive for back pain. Negative for myalgias.  Neurological: Positive for headaches (Had in the  afternoon took 1 tylenol which helped). Negative for dizziness, weakness and light-headedness.  Psychiatric/Behavioral: Negative for dysphoric mood. The patient is not nervous/anxious.        Objective:    Physical Exam Vitals reviewed.  Constitutional:      Appearance: Normal appearance. She is normal weight.  HENT:     Right Ear: Tympanic membrane, ear canal and external ear normal.     Left Ear: Tympanic membrane and external ear normal.     Nose: Congestion present.     Right Turbinates: Pale (edema).     Left Turbinates: Pale (edema).     Mouth/Throat:     Pharynx: Oropharynx is clear.  Cardiovascular:     Rate and Rhythm: Normal rate and regular rhythm.     Pulses: Normal pulses.     Heart sounds: Normal heart sounds. No murmur heard.   Pulmonary:     Effort: Pulmonary effort is normal. No respiratory distress.     Breath sounds: Normal breath sounds.  Neurological:     Mental Status: She is alert.     BP 114/68   Pulse 92   Temp (!) 96.4 F (35.8 C)   Resp 18   Ht _0  (1.702 m)   Wt 149 lb 9.6 oz (67.9 kg)   BMI 23.43 kg/m  Wt Readings from Last 3 Encounters:  07/13/20 149 lb 9.6 oz (67.9 kg)  06/09/20 143 lb (64.9 kg)  04/29/20 139 lb 6.4 oz (63.2 kg)    Health Maintenance Due  Topic Date Due  . Hepatitis C Screening  Never done  . PNA vac Low Risk Adult (2 of 2 - PPSV23) 02/19/2019  . COVID-19 Vaccine (2 - Pfizer risk 4-dose series) 03/18/2020    There are no preventive care reminders to display for this patient.   Lab Results  Component Value Date   TSH 3.830 06/09/2020   Lab Results  Component Value Date   WBC 7.3 06/09/2020   HGB 11.6 06/09/2020   HCT 37.4 06/09/2020   MCV 78 (L) 06/09/2020   PLT 387 06/09/2020   Lab Results  Component Value Date   NA 140 06/09/2020   K 4.5 06/09/2020   CO2 23 06/09/2020   GLUCOSE 95 06/09/2020   BUN 12 06/09/2020   CREATININE 1.00 06/09/2020   BILITOT 0.3 06/09/2020   ALKPHOS 80 06/09/2020    AST 23 06/09/2020   ALT 20 06/09/2020   PROT 6.9 06/09/2020   ALBUMIN 4.2 06/09/2020   CALCIUM 9.0 06/09/2020   ANIONGAP 7 03/31/2020   EGFR 60 06/09/2020   Lab Results  Component Value Date   CHOL 267 (H) 06/09/2020   Lab Results  Component Value Date  HDL 41 06/09/2020   Lab Results  Component Value Date   LDLCALC 183 (H) 06/09/2020   Lab Results  Component Value Date   TRIG 224 (H) 06/09/2020   Lab Results  Component Value Date   CHOLHDL 6.5 (H) 06/09/2020   No results found for: HGBA1C     Assessment & Plan:  1. Seasonal allergic rhinitis, unspecified trigger Continue singulair daily, add zyrtec  2. Cough - POC COVID-19 BinaxNow negative. May use robitussin.    Orders Placed This Encounter  Procedures  . POC COVID-19 BinaxNow     Follow-up: No follow-ups on file.  An After Visit Summary was printed and given to the patient.  Rochel Brome, MD [facility] [phone]

## 2020-07-19 ENCOUNTER — Other Ambulatory Visit: Payer: Self-pay | Admitting: Physician Assistant

## 2020-07-19 DIAGNOSIS — I700 Atherosclerosis of aorta: Secondary | ICD-10-CM

## 2020-08-19 ENCOUNTER — Other Ambulatory Visit: Payer: Self-pay | Admitting: *Deleted

## 2020-08-19 DIAGNOSIS — Z85118 Personal history of other malignant neoplasm of bronchus and lung: Secondary | ICD-10-CM

## 2020-08-25 DIAGNOSIS — Z4689 Encounter for fitting and adjustment of other specified devices: Secondary | ICD-10-CM | POA: Diagnosis not present

## 2020-09-25 ENCOUNTER — Other Ambulatory Visit: Payer: Self-pay | Admitting: Physician Assistant

## 2020-09-28 ENCOUNTER — Ambulatory Visit
Admission: RE | Admit: 2020-09-28 | Discharge: 2020-09-28 | Disposition: A | Discharge status: Home / Self Care | Payer: Medicare Other | Source: Ambulatory Visit | Attending: Thoracic Surgery (Cardiothoracic Vascular Surgery) | Admitting: Thoracic Surgery (Cardiothoracic Vascular Surgery)

## 2020-09-28 ENCOUNTER — Other Ambulatory Visit: Payer: Self-pay

## 2020-09-28 ENCOUNTER — Other Ambulatory Visit: Payer: Self-pay | Admitting: *Deleted

## 2020-09-28 ENCOUNTER — Ambulatory Visit (INDEPENDENT_AMBULATORY_CARE_PROVIDER_SITE_OTHER): Payer: Medicare Other | Admitting: Thoracic Surgery (Cardiothoracic Vascular Surgery)

## 2020-09-28 VITALS — BP 132/73 | HR 93 | Resp 20 | Ht 67.0 in | Wt 155.0 lb

## 2020-09-28 DIAGNOSIS — J439 Emphysema, unspecified: Secondary | ICD-10-CM | POA: Diagnosis not present

## 2020-09-28 DIAGNOSIS — Z9889 Other specified postprocedural states: Secondary | ICD-10-CM | POA: Diagnosis not present

## 2020-09-28 DIAGNOSIS — C3490 Malignant neoplasm of unspecified part of unspecified bronchus or lung: Secondary | ICD-10-CM | POA: Diagnosis not present

## 2020-09-28 DIAGNOSIS — Z85118 Personal history of other malignant neoplasm of bronchus and lung: Secondary | ICD-10-CM | POA: Diagnosis not present

## 2020-09-28 DIAGNOSIS — I2510 Atherosclerotic heart disease of native coronary artery without angina pectoris: Secondary | ICD-10-CM | POA: Diagnosis not present

## 2020-09-28 DIAGNOSIS — R222 Localized swelling, mass and lump, trunk: Secondary | ICD-10-CM

## 2020-09-28 DIAGNOSIS — R911 Solitary pulmonary nodule: Secondary | ICD-10-CM | POA: Diagnosis not present

## 2020-09-28 DIAGNOSIS — I700 Atherosclerosis of aorta: Secondary | ICD-10-CM | POA: Diagnosis not present

## 2020-09-28 DIAGNOSIS — Q248 Other specified congenital malformations of heart: Secondary | ICD-10-CM

## 2020-09-28 NOTE — Progress Notes (Signed)
[address]       [place] [address]             [phone]     HPI: [name] returns for a scheduled follow-up visit.  [name] is a 70 year old woman with a past medical history significant for tobacco abuse, COPD, lung cancer, aortic atherosclerosis, heart murmur, coronary calcification, hiatal hernia, reflux, hyperlipidemia, subclavian stenosis, COVID-19, and pneumonia.  She had a robotic assisted right middle lobectomy by Dr. Servando Snare in February 2022.  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  She continues to have burning and tingling pain along the area where the incisions were.  There also has some numbness and she has a bulge in her right upper quadrant.  The pain has improved over time but has not resolved completely.  She has not had any respiratory issues recently.  She quit smoking prior to her surgery.  Past Medical History:  Diagnosis Date   Abnormal chest x-ray 01/19/2020   Acute laryngopharyngitis 03/18/2020   Aortic atherosclerosis ([place]) 01/28/2020   Arthritis    Cardiac murmur 03/01/2020   COPD (chronic obstructive pulmonary disease) ([facility])    Coronary artery calcification seen on CT scan 03/01/2020   Cough 01/12/2020   COVID-19 03/10/2020   Dyspnea    on exertion,after getting covid   Family history of adverse reaction to anesthesia    brothr had n/v   GERD (gastroesophageal reflux disease)    Hiatal hernia    Hyperlipemia    Pneumonia    Pre-operative cardiovascular examination 03/01/2020   Right lower lobe lung mass 01/28/2020   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     Coenzyme Q10 (CO Q-10 PO) Take 2 tablets by mouth daily.     montelukast (SINGULAIR) 10 MG tablet TAKE 1 TABLET BY MOUTH EVERYDAY AT BEDTIME 90 tablet 1   Multiple  Vitamin (MULTIVITAMIN ADULT PO) Take 2 tablets by mouth daily.     Multiple Vitamins-Minerals (VITAMIN D3 COMPLETE PO) Take by mouth.     omeprazole (PRILOSEC) 20 MG capsule Take 1 capsule (20 mg total) by mouth 2 (two) times daily before a meal. 180 capsule 1   rosuvastatin (CRESTOR) 5 MG tablet TAKE 1 TABLET BY MOUTH EVERY DAY 90 tablet 0   VITAMIN D PO Take 2 tablets by mouth daily.     No current facility-administered medications for this visit.    Physical Exam BP 132/73 (BP Location: Right Arm, Patient Position: Sitting)   Pulse 93   Resp 20   Ht 5\' 7"  (1.702 m)   Wt 155 lb (70.3 kg)   SpO2 96%   BMI 24.51 kg/m  70 year old woman in no acute distress Alert and oriented x3 with no focal deficits No cervical or supraclavicular adenopathy Lungs clear bilaterally Cardiac regular rate and rhythm  Diagnostic Tests: CT CHEST WITHOUT CONTRAST   TECHNIQUE: Multidetector CT imaging of the chest was performed following the standard protocol without IV contrast.   COMPARISON:  01/23/2020   FINDINGS: Cardiovascular: The heart size is normal. No substantial pericardial effusion. Coronary artery calcification is evident. Mild atherosclerotic calcification is noted in the wall of the thoracic aorta.   Mediastinum/Nodes: 2.2 x 3.7 x 4.4 cm well-defined homogeneous lesion in the anterior  mediastinum was present previously but has progressed in the interval. This has relatively low density although it 19 Hounsfield units is higher than would be expected for simple fluid. No mediastinal lymphadenopathy. No evidence for gross hilar lymphadenopathy although assessment is limited by the lack of intravenous contrast on today's study. The esophagus has normal imaging features. There is no axillary lymphadenopathy.   Lungs/Pleura: Centrilobular and paraseptal emphysema evident. Volume loss right hemithorax compatible with prior right middle lobectomy biapical pleuroparenchymal scarring  evident. 6 mm anterior right upper lobe nodule on 21/8 not definitely present on the prior study. Postsurgical scarring parahilar right lung no other suspicious pulmonary nodule or mass. No focal airspace consolidation. No pleural effusion.   Upper Abdomen: The liver shows diffusely decreased attenuation suggesting fat deposition.   Musculoskeletal: No worrisome lytic or sclerotic osseous abnormality.   IMPRESSION: 1. 2.2 x 3.7 x 4.4 cm well-defined homogeneous lesion in the anterior mediastinum was present previously but has progressed in the interval. This has relatively low density although it is higher than would be expected for simple fluid. 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 2. 6 mm anterior right upper lobe pulmonary nodule not definitely present on the prior study. Attention on follow-up recommended 3. Hepatic steatosis. 4. Aortic Atherosclerosis (ICD10-I70.0) and Emphysema (ICD10-J43.9).     Electronically Signed   By: Misty Stanley M.D.   On: 09/28/2020 14:58   I personally reviewed the CT images.  Noted the lesion in the right pericardial fat pad.  Discussed possible etiologies with Dr. Collene Mares cell.  Impression: [name] is a 70 year old woman with a past medical history significant for tobacco abuse, COPD, lung cancer, aortic atherosclerosis, heart murmur, coronary calcification, hiatal hernia, reflux, hyperlipidemia, subclavian stenosis, COVID-19, and pneumonia.  She had a robotic assisted right middle lobectomy by Dr. Servando Snare in February 2022.  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  She now is about 6 months out from surgery.  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  She is not taking anything for that.  Her symptoms have improved over time.  No evidence of recurrent disease.  Unusual appearing homogenous lesion in the right  pericardial fat pad.  Differential diagnosis includes pericardial cyst or metastatic disease to a node.  Given that she had a low-grade adenocarcinoma with negative nodes I think recurrence is extremely unlikely.  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  Discussed with Dr. Tery Sanfilippo and he thinks an MRI with and without contrast can be helpful in determining the etiology.  Tobacco abuse-quit smoking in 2019  Plan: MR chest with and without contrast to evaluate possible pericardial cyst versus lymph node. Return in 2 weeks to discuss results  Melrose Nakayama, MD [facility] [phone]

## 2020-09-28 NOTE — Progress Notes (Signed)
  HPI:  Patient returns for routine postoperative follow-up having undergone  The patient's early postoperative recovery while in the hospital was notable for Since hospital discharge the patient reports   Current Outpatient Medications  Medication Sig Dispense Refill   Ascorbic Acid (VITAMIN C PO) Take 3 tablets by mouth daily.     Coenzyme Q10 (CO Q-10 PO) Take 2 tablets by mouth daily.     montelukast (SINGULAIR) 10 MG tablet TAKE 1 TABLET BY MOUTH EVERYDAY AT BEDTIME 90 tablet 1   Multiple Vitamin (MULTIVITAMIN ADULT PO) Take 2 tablets by mouth daily.     Multiple Vitamins-Minerals (VITAMIN D3 COMPLETE PO) Take by mouth.     omeprazole (PRILOSEC) 20 MG capsule Take 1 capsule (20 mg total) by mouth 2 (two) times daily before a meal. 180 capsule 1   rosuvastatin (CRESTOR) 5 MG tablet TAKE 1 TABLET BY MOUTH EVERY DAY 90 tablet 0   VITAMIN D PO Take 2 tablets by mouth daily.     No current facility-administered medications for this visit.    Physical Exam  Diagnostic Tests:   Impression:  Plan:   Melrose Nakayama, MD [facility] [phone]

## 2020-10-04 ENCOUNTER — Other Ambulatory Visit: Payer: Self-pay

## 2020-10-04 ENCOUNTER — Ambulatory Visit
Admission: RE | Admit: 2020-10-04 | Discharge: 2020-10-04 | Disposition: A | Discharge status: Home / Self Care | Payer: Medicare Other | Source: Ambulatory Visit | Attending: Thoracic Surgery (Cardiothoracic Vascular Surgery) | Admitting: Thoracic Surgery (Cardiothoracic Vascular Surgery)

## 2020-10-04 DIAGNOSIS — R222 Localized swelling, mass and lump, trunk: Secondary | ICD-10-CM

## 2020-10-04 DIAGNOSIS — Q248 Other specified congenital malformations of heart: Secondary | ICD-10-CM

## 2020-10-04 DIAGNOSIS — Z902 Acquired absence of lung [part of]: Secondary | ICD-10-CM | POA: Diagnosis not present

## 2020-10-04 DIAGNOSIS — C3490 Malignant neoplasm of unspecified part of unspecified bronchus or lung: Secondary | ICD-10-CM | POA: Diagnosis not present

## 2020-10-04 DIAGNOSIS — Z9889 Other specified postprocedural states: Secondary | ICD-10-CM | POA: Diagnosis not present

## 2020-10-04 DIAGNOSIS — J9859 Other diseases of mediastinum, not elsewhere classified: Secondary | ICD-10-CM | POA: Diagnosis not present

## 2020-10-04 MED ORDER — GADOBENATE DIMEGLUMINE 529 MG/ML IV SOLN
14.0000 mL | Freq: Once | INTRAVENOUS | Status: AC | PRN
  Administered 2020-10-04: 14 mL via INTRAVENOUS

## 2020-10-13 ENCOUNTER — Ambulatory Visit (INDEPENDENT_AMBULATORY_CARE_PROVIDER_SITE_OTHER): Payer: Medicare Other | Admitting: Thoracic Surgery (Cardiothoracic Vascular Surgery)

## 2020-10-13 ENCOUNTER — Other Ambulatory Visit: Payer: Self-pay

## 2020-10-13 VITALS — BP 130/78 | HR 88 | Resp 20 | Ht 67.0 in | Wt 157.0 lb

## 2020-10-13 DIAGNOSIS — Z85118 Personal history of other malignant neoplasm of bronchus and lung: Secondary | ICD-10-CM | POA: Diagnosis not present

## 2020-10-13 DIAGNOSIS — I2510 Atherosclerotic heart disease of native coronary artery without angina pectoris: Secondary | ICD-10-CM

## 2020-10-13 DIAGNOSIS — Q248 Other specified congenital malformations of heart: Secondary | ICD-10-CM

## 2020-10-13 NOTE — Progress Notes (Signed)
[address]       [place] [address]             [phone]     HPI:[name] returns to discuss the results of her MR.  [name] is a 70 year old woman with a history of tobacco abuse, COPD, lung cancer, aortic atherosclerosis, heart murmur, coronary calcification, hiatal hernia, reflux, hyperlipidemia, subclavian stenosis, pneumonia, and COVID-19.  Dr. Servando Snare did a robotic assisted right middle lobectomy in February 2022.  She had a T2a, N0, stage Ib adenocarcinoma.  He arranged for follow-up with me which occurred a couple of weeks ago.  She was having some intercostal neuralgia but was not requiring any pain medication.  She was very anxious about a cystic lesion noted in the pericardial fat pad on her CT.  She was sent for an MR.  She says that after the MR she was having severe back pain and now is having some sciatica on the left side.  She has an appointment with her family doctor about that tomorrow.  Past Medical History:  Diagnosis Date   Abnormal chest x-ray 01/19/2020   Acute laryngopharyngitis 03/18/2020   Aortic atherosclerosis ([place]) 01/28/2020   Arthritis    Cardiac murmur 03/01/2020   COPD (chronic obstructive pulmonary disease) ([facility])    Coronary artery calcification seen on CT scan 03/01/2020   Cough 01/12/2020   COVID-19 03/10/2020   Dyspnea    on exertion,after getting covid   Family history of adverse reaction to anesthesia    brothr had n/v   GERD (gastroesophageal reflux disease)    Hiatal hernia    Hyperlipemia    Pneumonia    Pre-operative cardiovascular examination 03/01/2020   Right lower lobe lung mass 01/28/2020   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     cetirizine (ZYRTEC ALLERGY) 10 MG tablet Take 10 mg by mouth daily.     Coenzyme Q10  (CO Q-10 PO) Take 2 tablets by mouth daily.     montelukast (SINGULAIR) 10 MG tablet TAKE 1 TABLET BY MOUTH EVERYDAY AT BEDTIME 90 tablet 1   Multiple Vitamin (MULTIVITAMIN ADULT PO) Take 2 tablets by mouth daily.     Multiple Vitamins-Minerals (VITAMIN D3 COMPLETE PO) Take by mouth.     omeprazole (PRILOSEC) 20 MG capsule Take 1 capsule (20 mg total) by mouth 2 (two) times daily before a meal. 180 capsule 1   rosuvastatin (CRESTOR) 5 MG tablet TAKE 1 TABLET BY MOUTH EVERY DAY 90 tablet 0   VITAMIN D PO Take 2 tablets by mouth daily.     No current facility-administered medications for this visit.    Physical Exam BP 130/78   Pulse 88   Resp 20   Ht 5\' 7"  (1.702 m)   Wt 157 lb (71.2 kg)   SpO2 96% Comment: RA  BMI 24.71 kg/m  70 year old woman in no acute distress Alert and oriented x3 with no focal deficits Lungs clear  Diagnostic Tests: MR CHEST WITH AND WITHOUT CONTRAST   TECHNIQUE: Multiplanar, multisequence MR imaging of the chest was performed before and after the administration of intravenous contrast.   CONTRAST:  27mL MULTIHANCE GADOBENATE DIMEGLUMINE 529 MG/ML IV SOLN   COMPARISON:  CT 09/28/2020 com PET-CT 02/17/2020   FINDINGS: Along the anterior  RIGHT mediastinum adjacent to the RIGHT atrial appendage is a well-circumscribed cyst-like lesion which corresponds indeterminate lesion on comparison CT.   Lesion measures 3.2 x 2.1 cm (image 19/series 8) and has high signal intensity on T2 weighted imaging. Lesion has low signal intensity on T1 weighted imaging (series 19) and demonstrates no restricted diffusion (series 17). Findings are typical of a cystic lesion.   Postcontrast enhanced T1 weighted imaging demonstrates no enhancement by direct measurement and subtraction imaging (series 23 and 24 for example).   Elsewhere in the lungs post partial RIGHT middle lobectomy without complication.   No mediastinal lymphadenopathy. No axillary  supraclavicular adenopathy.   Heart great vessels are normal.   IMPRESSION: Benign appearing cystic lesion in the anterior mediastinum. 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   Postsurgical change in the RIGHT lung without complication or recurrence.     Electronically Signed   By: Suzy Bouchard M.D.   On: 10/04/2020 11:03   I personally reviewed the MR images.  There is a cystic lesion in the anterior mediastinum.  Impression: [name] is a 70 year old woman with a history of tobacco abuse, COPD, lung cancer, aortic atherosclerosis, heart murmur, coronary calcification, hiatal hernia, reflux, hyperlipidemia, subclavian stenosis, pneumonia, and COVID-19.  Dr. Servando Snare performed a right middle lobectomy for a stage Ib (T2 a, N0) adenocarcinoma in February 2022.  She is now 6 months out from surgery with no evidence of recurrent disease.  Cystic lesion in the right pericardial fat pad is consistent with a benign pericardial cyst.  No other work-up or investigation is necessary.  If this were to grow over time we can have it aspirated under CT guidance.  Plan: Follow-up with primary regarding sciatica Return in 6 months with CT chest  Melrose Nakayama, MD [facility] [phone]

## 2020-10-14 ENCOUNTER — Ambulatory Visit (INDEPENDENT_AMBULATORY_CARE_PROVIDER_SITE_OTHER): Payer: Medicare Other | Admitting: Physician Assistant

## 2020-10-14 ENCOUNTER — Encounter: Payer: Self-pay | Admitting: Physician Assistant

## 2020-10-14 VITALS — BP 128/72 | HR 76 | Temp 97.3°F | Ht 67.0 in | Wt 156.6 lb

## 2020-10-14 DIAGNOSIS — M5442 Lumbago with sciatica, left side: Secondary | ICD-10-CM | POA: Diagnosis not present

## 2020-10-14 MED ORDER — CYCLOBENZAPRINE HCL 5 MG PO TABS: 5.0000 mg | ORAL_TABLET | Freq: Every day | ORAL | 0 refills | Status: DC

## 2020-10-14 MED ORDER — TRIAMCINOLONE ACETONIDE 40 MG/ML IJ SUSP
60.0000 mg | Freq: Once | INTRAMUSCULAR | Status: AC
  Administered 2020-10-14: 60 mg via INTRAMUSCULAR

## 2020-10-14 NOTE — Progress Notes (Signed)
Acute Office Visit  Subjective:    Patient ID: [name], female    DOB: 1950-04-19, 70 y.o.   MRN: [id number]  Chief Complaint  Patient presents with   Back Pain    HPI Patient is in today for low back pain - pt states that after having a MRI last week she went to get up and had pain in her lower back and down her left leg - she states it is doing some better but restless at night Denies numbness or tingling  Past Medical History:  Diagnosis Date   Abnormal chest x-ray 01/19/2020   Acute laryngopharyngitis 03/18/2020   Aortic atherosclerosis ([place]) 01/28/2020   Arthritis    Cardiac murmur 03/01/2020   COPD (chronic obstructive pulmonary disease) ([place])    Coronary artery calcification seen on CT scan 03/01/2020   Cough 01/12/2020   COVID-19 03/10/2020   Dyspnea    on exertion,after getting covid   Family history of adverse reaction to anesthesia    brothr had n/v   GERD (gastroesophageal reflux disease)    Hiatal hernia    Hyperlipemia    Pneumonia    Pre-operative cardiovascular examination 03/01/2020   Right lower lobe lung mass 01/28/2020   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 No pertinent surgical history.  Family History  Problem Relation Age of Onset   Heart disease Mother    Heart attack Mother    Cancer Father    Diabetes Father    Non-Hodgkin's lymphoma Brother    High Cholesterol Brother    High Cholesterol Brother    Heart disease Brother    Heart attack Brother     Social History   Socioeconomic History   Marital status: Widowed    Spouse name: Not on file   Number of children: Not on file   Years of education: Not on file   Highest education level: Not on file  Occupational History   Not on file  Tobacco Use   Smoking status: Former    Years: 50.00    Types: Cigarettes    Quit date: 03/22/2017    Years since quitting: 3.5   Smokeless tobacco: Never  Vaping Use    Vaping Use: Never used  Substance and Sexual Activity   Alcohol use: Not Currently   Drug use: Never   Sexual activity: Not Currently  Other Topics Concern   Not on file  Social History Narrative   Not on file   Social Determinants of Health   Financial Resource Strain: Not on file  Food Insecurity: Not on file  Transportation Needs: Not on file  Physical Activity: Not on file  Stress: Not on file  Social Connections: Not on file  Intimate Partner Violence: Not on file    Outpatient Medications Prior to Visit  Medication Sig Dispense Refill   Ascorbic Acid (VITAMIN C PO) Take 3 tablets by mouth daily.     cetirizine (ZYRTEC) 10 MG tablet Take 10 mg by mouth daily.     Coenzyme Q10 (CO Q-10 PO) Take 2 tablets by mouth daily.     montelukast (SINGULAIR) 10 MG tablet TAKE 1 TABLET BY MOUTH EVERYDAY AT BEDTIME 90 tablet 1   Multiple Vitamin (MULTIVITAMIN ADULT PO) Take 2 tablets by mouth daily.     Multiple Vitamins-Minerals (VITAMIN D3 COMPLETE PO) Take by mouth.     omeprazole (PRILOSEC) 20 MG capsule Take 1 capsule (  20 mg total) by mouth 2 (two) times daily before a meal. 180 capsule 1   rosuvastatin (CRESTOR) 5 MG tablet TAKE 1 TABLET BY MOUTH EVERY DAY 90 tablet 0   VITAMIN D PO Take 2 tablets by mouth daily.     No facility-administered medications prior to visit.    Allergies  Allergen Reactions   Codeine Hives   Surgical Lubricant Hives    Review of Systems CONSTITUTIONAL: Negative for chills, fatigue, fever, unintentional weight gain and unintentional weight loss.  CARDIOVASCULAR: Negative for chest pain, dizziness, palpitations and pedal edema.  RESPIRATORY: Negative for recent cough and dyspnea.  MSK: see HPI INTEGUMENTARY: Negative for rash.          Objective:    Physical Exam  BP 128/72 (BP Location: Left Arm, Patient Position: Sitting, Cuff Size: Normal)   Pulse 76   Temp (!) 97.3 F (36.3 C) (Temporal)   Ht _0  (1.702 m)   Wt 156 lb 9.6 oz  (71 kg)   SpO2 96%   BMI 24.53 kg/m  Wt Readings from Last 3 Encounters:  10/14/20 156 lb 9.6 oz (71 kg)  10/13/20 157 lb (71.2 kg)  09/28/20 155 lb (70.3 kg)  PHYSICAL EXAM:   VS: BP 128/72 (BP Location: Left Arm, Patient Position: Sitting, Cuff Size: Normal)   Pulse 76   Temp (!) 97.3 F (36.3 C) (Temporal)   Ht _1  (1.702 m)   Wt 156 lb 9.6 oz (71 kg)   SpO2 96%   BMI 24.53 kg/m   GEN: Well nourished, well developed, in no acute distress   Cardiac: RRR; no murmurs, rubs, or gallops,no edema  Respiratory:  normal respiratory rate and pattern with no distress - normal breath sounds with no rales, rhonchi, wheezes or rubs MS: no deformity or atrophy  Skin: warm and dry, no rash    Health Maintenance Due  Topic Date Due   Hepatitis C Screening  Never done   Zoster Vaccines- Shingrix (1 of 2) Never done   PNA vac Low Risk Adult (2 of 2 - PPSV23) 02/19/2019   COVID-19 Vaccine (2 - Pfizer risk series) 03/18/2020   INFLUENZA VACCINE  09/20/2020    There are no preventive care reminders to display for this patient.   Lab Results  Component Value Date   TSH 3.830 06/09/2020   Lab Results  Component Value Date   WBC 7.3 06/09/2020   HGB 11.6 06/09/2020   HCT 37.4 06/09/2020   MCV 78 (L) 06/09/2020   PLT 387 06/09/2020   Lab Results  Component Value Date   NA 140 06/09/2020   K 4.5 06/09/2020   CO2 23 06/09/2020   GLUCOSE 95 06/09/2020   BUN 12 06/09/2020   CREATININE 1.00 06/09/2020   BILITOT 0.3 06/09/2020   ALKPHOS 80 06/09/2020   AST 23 06/09/2020   ALT 20 06/09/2020   PROT 6.9 06/09/2020   ALBUMIN 4.2 06/09/2020   CALCIUM 9.0 06/09/2020   ANIONGAP 7 03/31/2020   EGFR 60 06/09/2020   Lab Results  Component Value Date   CHOL 267 (H) 06/09/2020   Lab Results  Component Value Date   HDL 41 06/09/2020   Lab Results  Component Value Date   LDLCALC 183 (H) 06/09/2020   Lab Results  Component Value Date   TRIG 224 (H) 06/09/2020   Lab  Results  Component Value Date   CHOLHDL 6.5 (H) 06/09/2020   No results found for: HGBA1C  Assessment & Plan:  1. Acute left-sided low back pain with left-sided sciatica - triamcinolone acetonide (KENALOG-40) injection 60 mg - cyclobenzaprine (FLEXERIL) 5 MG tablet; Take 1 tablet (5 mg total) by mouth at bedtime.  Dispense: 30 tablet; Refill: 0   Continue ibuprofen as needed Meds ordered this encounter  Medications   triamcinolone acetonide (KENALOG-40) injection 60 mg   cyclobenzaprine (FLEXERIL) 5 MG tablet    Sig: Take 1 tablet (5 mg total) by mouth at bedtime.    Dispense:  30 tablet    Refill:  0    Order Specific Question:   Supervising Provider    Answer:   Shelton Silvas    No orders of the defined types were placed in this encounter.    Follow-up: Return if symptoms worsen or fail to improve.  An After Visit Summary was printed and given to the patient.  Yetta Flock [facility] [phone]

## 2020-10-27 ENCOUNTER — Other Ambulatory Visit: Payer: Self-pay | Admitting: Physician Assistant

## 2020-10-27 DIAGNOSIS — Z4689 Encounter for fitting and adjustment of other specified devices: Secondary | ICD-10-CM | POA: Diagnosis not present

## 2020-11-10 ENCOUNTER — Ambulatory Visit (INDEPENDENT_AMBULATORY_CARE_PROVIDER_SITE_OTHER): Payer: Medicare Other | Admitting: Physician Assistant

## 2020-11-10 ENCOUNTER — Encounter: Payer: Self-pay | Admitting: Physician Assistant

## 2020-11-10 VITALS — BP 128/76 | HR 71 | Temp 97.3°F | Ht 67.0 in | Wt 151.6 lb

## 2020-11-10 DIAGNOSIS — J060 Acute laryngopharyngitis: Secondary | ICD-10-CM | POA: Diagnosis not present

## 2020-11-10 LAB — POC COVID19 BINAXNOW: SARS Coronavirus 2 Ag: NEGATIVE

## 2020-11-10 MED ORDER — AZITHROMYCIN 250 MG PO TABS: ORAL_TABLET | ORAL | 0 refills | Status: AC

## 2020-11-10 NOTE — Progress Notes (Signed)
Acute Office Visit  Subjective:    Patient ID: [name], female    DOB: 1950/11/05, 70 y.o.   MRN: [id number]  Chief Complaint  Patient presents with   Cough    Chest congestion    HPI Patient is in today for complaints of sore throat, pnd and cough - started yesterday States she has had a mild headache and cough is slightly productive - is using tessalon perles that she has at home Denies chest pain or dyspnea  Past Medical History:  Diagnosis Date   Abnormal chest x-ray 01/19/2020   Acute laryngopharyngitis 03/18/2020   Aortic atherosclerosis ([place]) 01/28/2020   Arthritis    Cardiac murmur 03/01/2020   COPD (chronic obstructive pulmonary disease) ([facility])    Coronary artery calcification seen on CT scan 03/01/2020   Cough 01/12/2020   COVID-19 03/10/2020   Dyspnea    on exertion,after getting covid   Family history of adverse reaction to anesthesia    brothr had n/v   GERD (gastroesophageal reflux disease)    Hiatal hernia    Hyperlipemia    Pneumonia    Pre-operative cardiovascular examination 03/01/2020   Right lower lobe lung mass 01/28/2020   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 No pertinent surgical history.  Family History  Problem Relation Age of Onset   Heart disease Mother    Heart attack Mother    Cancer Father    Diabetes Father    Non-Hodgkin's lymphoma Brother    High Cholesterol Brother    High Cholesterol Brother    Heart disease Brother    Heart attack Brother     Social History   Socioeconomic History   Marital status: Widowed    Spouse name: Not on file   Number of children: Not on file   Years of education: Not on file   Highest education level: Not on file  Occupational History   Not on file  Tobacco Use   Smoking status: Former    Years: 50.00    Types: Cigarettes    Quit date: 03/22/2017    Years since quitting: 3.6   Smokeless tobacco: Never  Vaping  Use   Vaping Use: Never used  Substance and Sexual Activity   Alcohol use: Not Currently   Drug use: Never   Sexual activity: Not Currently  Other Topics Concern   Not on file  Social History Narrative   Not on file   Social Determinants of Health   Financial Resource Strain: Not on file  Food Insecurity: Not on file  Transportation Needs: Not on file  Physical Activity: Not on file  Stress: Not on file  Social Connections: Not on file  Intimate Partner Violence: Not on file    Outpatient Medications Prior to Visit  Medication Sig Dispense Refill   Ascorbic Acid (VITAMIN C PO) Take 3 tablets by mouth daily.     cetirizine (ZYRTEC) 10 MG tablet Take 10 mg by mouth daily.     Coenzyme Q10 (CO Q-10 PO) Take 2 tablets by mouth daily.     cyclobenzaprine (FLEXERIL) 5 MG tablet Take 1 tablet (5 mg total) by mouth at bedtime. 30 tablet 0   montelukast (SINGULAIR) 10 MG tablet TAKE 1 TABLET BY MOUTH EVERYDAY AT BEDTIME 90 tablet 1   Multiple Vitamin (MULTIVITAMIN ADULT PO) Take 2 tablets by mouth daily.     Multiple Vitamins-Minerals (VITAMIN D3 COMPLETE PO)  Take by mouth.     omeprazole (PRILOSEC) 20 MG capsule TAKE 1 CAPSULE (20 MG TOTAL) BY MOUTH 2 (TWO) TIMES DAILY BEFORE A MEAL. 180 capsule 1   rosuvastatin (CRESTOR) 5 MG tablet TAKE 1 TABLET BY MOUTH EVERY DAY 90 tablet 0   VITAMIN D PO Take 2 tablets by mouth daily.     No facility-administered medications prior to visit.    Allergies  Allergen Reactions   Codeine Hives   Surgical Lubricant Hives    Review of Systems CONSTITUTIONAL: Negative for chills, fatigue, fever, unintentional weight gain and unintentional weight loss.  E/N/T: see HPI CARDIOVASCULAR: Negative for chest pain, dizziness, palpitations and pedal edema.  RESPIRATORY: see HPI         Objective:    Physical Exam  BP 128/76 (BP Location: Left Arm, Patient Position: Sitting, Cuff Size: Normal)   Pulse 71   Temp (!) 97.3 F (36.3 C) (Temporal)    Ht '5\' 7"'  (1.702 m)   Wt 151 lb 9.6 oz (68.8 kg)   SpO2 96%   BMI 23.74 kg/m  Wt Readings from Last 3 Encounters:  11/10/20 151 lb 9.6 oz (68.8 kg)  10/14/20 156 lb 9.6 oz (71 kg)  10/13/20 157 lb (71.2 kg)  PHYSICAL EXAM:   VS: BP 128/76 (BP Location: Left Arm, Patient Position: Sitting, Cuff Size: Normal)   Pulse 71   Temp (!) 97.3 F (36.3 C) (Temporal)   Ht '5\' 7"'  (1.702 m)   Wt 151 lb 9.6 oz (68.8 kg)   SpO2 96%   BMI 23.74 kg/m   GEN: Well nourished, well developed, in no acute distress  HEENT: normal external ears and nose - normal external auditory canals and TMS - hearing grossly normal - normal nasal mucosa and septum - Lips, Teeth and Gums - normal  Oropharynx - erythema/pnd Cardiac: RRR; no murmurs, rubs, or gallops, Respiratory:  normal respiratory rate and pattern with no distress - normal breath sounds with no rales, rhonchi, wheezes or rubs  Office Visit on 11/10/2020  Component Date Value Ref Range Status   SARS Coronavirus 2 Ag 11/10/2020 Negative  Negative Final    Health Maintenance Due  Topic Date Due   Hepatitis C Screening  Never done   Zoster Vaccines- Shingrix (1 of 2) Never done   COVID-19 Vaccine (2 - Pfizer risk series) 03/18/2020   INFLUENZA VACCINE  09/20/2020    There are no preventive care reminders to display for this patient.   Lab Results  Component Value Date   TSH 3.830 06/09/2020   Lab Results  Component Value Date   WBC 7.3 06/09/2020   HGB 11.6 06/09/2020   HCT 37.4 06/09/2020   MCV 78 (L) 06/09/2020   PLT 387 06/09/2020   Lab Results  Component Value Date   NA 140 06/09/2020   K 4.5 06/09/2020   CO2 23 06/09/2020   GLUCOSE 95 06/09/2020   BUN 12 06/09/2020   CREATININE 1.00 06/09/2020   BILITOT 0.3 06/09/2020   ALKPHOS 80 06/09/2020   AST 23 06/09/2020   ALT 20 06/09/2020   PROT 6.9 06/09/2020   ALBUMIN 4.2 06/09/2020   CALCIUM 9.0 06/09/2020   ANIONGAP 7 03/31/2020   EGFR 60 06/09/2020   Lab Results   Component Value Date   CHOL 267 (H) 06/09/2020   Lab Results  Component Value Date   HDL 41 06/09/2020   Lab Results  Component Value Date   LDLCALC 183 (H) 06/09/2020  Lab Results  Component Value Date   TRIG 224 (H) 06/09/2020   Lab Results  Component Value Date   CHOLHDL 6.5 (H) 06/09/2020   No results found for: HGBA1C     Assessment & Plan:  1. Acute laryngopharyngitis - POC COVID-19 BinaxNow - azithromycin (ZITHROMAX) 250 MG tablet; Take 2 tablets on day 1, then 1 tablet daily on days 2 through 5  Dispense: 6 tablet; Refill: 0  Continue tessalon and mucinex as needed  Meds ordered this encounter  Medications   azithromycin (ZITHROMAX) 250 MG tablet    Sig: Take 2 tablets on day 1, then 1 tablet daily on days 2 through 5    Dispense:  6 tablet    Refill:  0    Order Specific Question:   Supervising Provider    AnswerShelton Silvas    Orders Placed This Encounter  Procedures   POC COVID-19 BinaxNow      Follow-up: Return if symptoms worsen or fail to improve.  An After Visit Summary was printed and given to the patient.  Yetta Flock [facility] [phone]

## 2020-11-15 ENCOUNTER — Other Ambulatory Visit: Payer: Self-pay | Admitting: Physician Assistant

## 2020-11-15 DIAGNOSIS — M5442 Lumbago with sciatica, left side: Secondary | ICD-10-CM

## 2020-12-10 ENCOUNTER — Encounter: Payer: Self-pay | Admitting: Physician Assistant

## 2020-12-10 ENCOUNTER — Ambulatory Visit (INDEPENDENT_AMBULATORY_CARE_PROVIDER_SITE_OTHER): Payer: Medicare Other | Admitting: Physician Assistant

## 2020-12-10 ENCOUNTER — Other Ambulatory Visit: Payer: Self-pay

## 2020-12-10 ENCOUNTER — Other Ambulatory Visit: Payer: Self-pay | Admitting: Physician Assistant

## 2020-12-10 VITALS — BP 114/64 | HR 75 | Temp 96.7°F | Ht 67.0 in | Wt 155.0 lb

## 2020-12-10 DIAGNOSIS — Z1231 Encounter for screening mammogram for malignant neoplasm of breast: Secondary | ICD-10-CM | POA: Diagnosis not present

## 2020-12-10 DIAGNOSIS — I700 Atherosclerosis of aorta: Secondary | ICD-10-CM | POA: Diagnosis not present

## 2020-12-10 DIAGNOSIS — C3491 Malignant neoplasm of unspecified part of right bronchus or lung: Secondary | ICD-10-CM | POA: Diagnosis not present

## 2020-12-10 DIAGNOSIS — Z23 Encounter for immunization: Secondary | ICD-10-CM

## 2020-12-10 DIAGNOSIS — E782 Mixed hyperlipidemia: Secondary | ICD-10-CM | POA: Diagnosis not present

## 2020-12-10 DIAGNOSIS — K449 Diaphragmatic hernia without obstruction or gangrene: Secondary | ICD-10-CM | POA: Diagnosis not present

## 2020-12-10 DIAGNOSIS — E559 Vitamin D deficiency, unspecified: Secondary | ICD-10-CM

## 2020-12-10 MED ORDER — MUPIROCIN 2 % EX OINT: 1.0000 "application " | TOPICAL_OINTMENT | Freq: Two times a day (BID) | CUTANEOUS | 2 refills | Status: AC

## 2020-12-10 NOTE — Progress Notes (Signed)
Subjective:  Patient ID: [name], female    DOB: 10-21-1950  Age: 70 y.o. MRN: [id number]  Chief Complaint  Patient presents with   Gastroesophageal Reflux   Hyperlipidemia    HPI  Pt with history of GERD and hiatal hernia- symptoms currently stable on omeprazole 20mg  qd - voices no concerns  Pt with history of lung carcinoma - had a partial lobectomy 03/2020 - states symptoms are stable and is following with specialists as directed - sees Dr Servando Snare  Pt was seen by Dr Roxan Hockey and diagnosed with a benign pericardial cyst - will be due for follow up CT in 6 months  Pt with history of hyperlipidemia and aortic atherosclerosis - currently on crestor 5mg  qd and due for labwork  Pt would like to be scheduled for mammogram and also to get flu shot today Current Outpatient Medications on File Prior to Visit  Medication Sig Dispense Refill   Ascorbic Acid (VITAMIN C PO) Take 3 tablets by mouth daily.     cetirizine (ZYRTEC) 10 MG tablet Take 10 mg by mouth daily.     Coenzyme Q10 (CO Q-10 PO) Take 2 tablets by mouth daily.     montelukast (SINGULAIR) 10 MG tablet TAKE 1 TABLET BY MOUTH EVERYDAY AT BEDTIME 90 tablet 1   Multiple Vitamin (MULTIVITAMIN ADULT PO) Take 2 tablets by mouth daily.     Multiple Vitamins-Minerals (VITAMIN D3 COMPLETE PO) Take by mouth.     omeprazole (PRILOSEC) 20 MG capsule TAKE 1 CAPSULE (20 MG TOTAL) BY MOUTH 2 (TWO) TIMES DAILY BEFORE A MEAL. 180 capsule 1   rosuvastatin (CRESTOR) 5 MG tablet TAKE 1 TABLET BY MOUTH EVERY DAY 90 tablet 0   VITAMIN D PO Take 2 tablets by mouth daily.     No current facility-administered medications on file prior to visit.   Past Medical History:  Diagnosis Date   Abnormal chest x-ray 01/19/2020   Acute laryngopharyngitis 03/18/2020   Aortic atherosclerosis ([place]) 01/28/2020   Arthritis    Cardiac murmur 03/01/2020   COPD (chronic obstructive pulmonary disease) ([facility])    Coronary artery calcification seen on CT scan  03/01/2020   Cough 01/12/2020   COVID-19 03/10/2020   Dyspnea    on exertion,after getting covid   Family history of adverse reaction to anesthesia    brothr had n/v   GERD (gastroesophageal reflux disease)    Hiatal hernia    Hyperlipemia    Pneumonia    Pre-operative cardiovascular examination 03/01/2020   Right lower lobe lung mass 01/28/2020   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 No pertinent surgical history.  Family History  Problem Relation Age of Onset   Heart disease Mother    Heart attack Mother    Cancer Father    Diabetes Father    Non-Hodgkin's lymphoma Brother    High Cholesterol Brother    High Cholesterol Brother    Heart disease Brother    Heart attack Brother    Social History   Socioeconomic History   Marital status: Widowed    Spouse name: Not on file   Number of children: Not on file   Years of education: Not on file   Highest education level: Not on file  Occupational History   Not on file  Tobacco Use   Smoking status: Former    Years: 50.00    Types: Cigarettes    Quit date: 03/22/2017  Years since quitting: 3.7   Smokeless tobacco: Never  Vaping Use   Vaping Use: Never used  Substance and Sexual Activity   Alcohol use: Not Currently   Drug use: Never   Sexual activity: Not Currently  Other Topics Concern   Not on file  Social History Narrative   Not on file   Social Determinants of Health   Financial Resource Strain: Not on file  Food Insecurity: Not on file  Transportation Needs: Not on file  Physical Activity: Not on file  Stress: Not on file  Social Connections: Not on file    Review of Systems CONSTITUTIONAL: Negative for chills, fatigue, fever, unintentional weight gain and unintentional weight loss.  E/N/T: Negative for ear pain, nasal congestion and sore throat.  CARDIOVASCULAR: Negative for chest pain, dizziness, palpitations and pedal edema.   RESPIRATORY: Negative for recent cough and dyspnea.  GASTROINTESTINAL: Negative for abdominal pain, acid reflux symptoms, constipation, diarrhea, nausea and vomiting.  MSK: Negative for arthralgias and myalgias.  INTEGUMENTARY: Negative for rash.  NEUROLOGICAL: Negative for dizziness and headaches.  PSYCHIATRIC: Negative for sleep disturbance and to question depression screen.  Negative for depression, negative for anhedonia.       Objective:  BP 114/64   Pulse 75   Temp (!) 96.7 F (35.9 C)   Ht 5\' 7"  (1.702 m)   Wt 155 lb (70.3 kg)   SpO2 98%   BMI 24.28 kg/m   BP/Weight 12/10/2020 11/10/2020 8/33/8250  Systolic BP 539 767 341  Diastolic BP 64 76 72  Wt. (Lbs) 155 151.6 156.6  BMI 24.28 23.74 24.53    Physical Exam PHYSICAL EXAM:   VS: BP 114/64   Pulse 75   Temp (!) 96.7 F (35.9 C)   Ht 5\' 7"  (1.702 m)   Wt 155 lb (70.3 kg)   SpO2 98%   BMI 24.28 kg/m   GEN: Well nourished, well developed, in no acute distress  Cardiac: RRR; no murmurs, rubs, or gallops,no edema -  Respiratory:  normal respiratory rate and pattern with no distress - normal breath sounds with no rales, rhonchi, wheezes or rubs MS: no deformity or atrophy  Skin: warm and dry, no rash  Neuro:  Alert and Oriented x 3,  CN II-Xii grossly intact Psych: euthymic mood, appropriate affect and demeanor  Diabetic Foot Exam - Simple   No data filed      Lab Results  Component Value Date   WBC 7.3 06/09/2020   HGB 11.6 06/09/2020   HCT 37.4 06/09/2020   PLT 387 06/09/2020   GLUCOSE 95 06/09/2020   CHOL 267 (H) 06/09/2020   TRIG 224 (H) 06/09/2020   HDL 41 06/09/2020   LDLCALC 183 (H) 06/09/2020   ALT 20 06/09/2020   AST 23 06/09/2020   NA 140 06/09/2020   K 4.5 06/09/2020   CL 100 06/09/2020   CREATININE 1.00 06/09/2020   BUN 12 06/09/2020   CO2 23 06/09/2020   TSH 3.830 06/09/2020   INR 1.0 03/25/2020      Assessment & Plan:   Problem List Items Addressed This Visit        Cardiovascular and Mediastinum   Aortic atherosclerosis ([place]) Continue current meds     Respiratory   Primary lung cancer, right middle lobe - resected Continue follow up with specialists     Other   Hiatal hernia Continue omeprazole 20mg    Hyperlipemia - Primary   Relevant Orders   CBC with Differential/Platelet  Comprehensive metabolic panel   Lipid panel Continue crestor and watch diet   Other Visit Diagnoses     Vitamin D deficiency       Relevant Orders   VITAMIN D 25 Hydroxy (Vit-D Deficiency, Fractures)   Encounter for screening mammogram for breast cancer       Relevant Orders   MM DIGITAL SCREENING BILATERAL   Need for prophylactic vaccination and inoculation against influenza       Relevant Orders   Flu Vaccine QUAD High Dose(Fluad)     .  No orders of the defined types were placed in this encounter.   Orders Placed This Encounter  Procedures   MM DIGITAL SCREENING BILATERAL   Flu Vaccine QUAD High Dose(Fluad)   CBC with Differential/Platelet   Comprehensive metabolic panel   Lipid panel   VITAMIN D 25 Hydroxy (Vit-D Deficiency, Fractures)     Follow-up: Return in about 6 months (around 06/10/2021) for chronic fasting follow up.  An After Visit Summary was printed and given to the patient.  Yetta Flock [facility] [phone]

## 2020-12-11 LAB — COMPREHENSIVE METABOLIC PANEL
ALT: 24 IU/L (ref 0–32)
AST: 27 IU/L (ref 0–40)
Albumin/Globulin Ratio: 1.7 (ref 1.2–2.2)
Albumin: 4.5 g/dL (ref 3.7–4.7)
Alkaline Phosphatase: 77 IU/L (ref 44–121)
BUN/Creatinine Ratio: 16 (ref 12–28)
BUN: 15 mg/dL (ref 8–27)
Bilirubin Total: 0.4 mg/dL (ref 0.0–1.2)
CO2: 25 mmol/L (ref 20–29)
Calcium: 9 mg/dL (ref 8.7–10.3)
Chloride: 99 mmol/L (ref 96–106)
Creatinine, Ser: 0.93 mg/dL (ref 0.57–1.00)
Globulin, Total: 2.7 g/dL (ref 1.5–4.5)
Glucose: 99 mg/dL (ref 70–99)
Potassium: 4.3 mmol/L (ref 3.5–5.2)
Sodium: 139 mmol/L (ref 134–144)
Total Protein: 7.2 g/dL (ref 6.0–8.5)
eGFR: 65 mL/min/{1.73_m2} (ref >59)

## 2020-12-11 LAB — CBC WITH DIFFERENTIAL/PLATELET
Basophils Absolute: 0 10*3/uL (ref 0.0–0.2)
Basos: 0 %
EOS (ABSOLUTE): 0.2 10*3/uL (ref 0.0–0.4)
Eos: 3 %
Hematocrit: 41.9 % (ref 34.0–46.6)
Hemoglobin: 13.2 g/dL (ref 11.1–15.9)
Immature Grans (Abs): 0 10*3/uL (ref 0.0–0.1)
Immature Granulocytes: 0 %
Lymphocytes Absolute: 2 10*3/uL (ref 0.7–3.1)
Lymphs: 30 %
MCH: 24.2 pg — ABNORMAL LOW (ref 26.6–33.0)
MCHC: 31.5 g/dL (ref 31.5–35.7)
MCV: 77 fL — ABNORMAL LOW (ref 79–97)
Monocytes Absolute: 0.6 10*3/uL (ref 0.1–0.9)
Monocytes: 9 %
Neutrophils Absolute: 3.9 10*3/uL (ref 1.4–7.0)
Neutrophils: 58 %
Platelets: 358 10*3/uL (ref 150–450)
RBC: 5.45 x10E6/uL — ABNORMAL HIGH (ref 3.77–5.28)
RDW: 14.9 % (ref 11.7–15.4)
WBC: 6.8 10*3/uL (ref 3.4–10.8)

## 2020-12-11 LAB — CARDIOVASCULAR RISK ASSESSMENT

## 2020-12-11 LAB — LIPID PANEL
Chol/HDL Ratio: 5.1 ratio — ABNORMAL HIGH (ref 0.0–4.4)
Cholesterol, Total: 257 mg/dL — ABNORMAL HIGH (ref 100–199)
HDL: 50 mg/dL (ref >39)
LDL Chol Calc (NIH): 170 mg/dL — ABNORMAL HIGH (ref 0–99)
Triglycerides: 202 mg/dL — ABNORMAL HIGH (ref 0–149)
VLDL Cholesterol Cal: 37 mg/dL (ref 5–40)

## 2020-12-11 LAB — VITAMIN D 25 HYDROXY (VIT D DEFICIENCY, FRACTURES): Vit D, 25-Hydroxy: 46.6 ng/mL (ref 30.0–100.0)

## 2020-12-15 ENCOUNTER — Telehealth: Payer: Self-pay | Admitting: Physician Assistant

## 2020-12-15 NOTE — Telephone Encounter (Signed)
   [name] has been scheduled for the following appointment:  WHAT: SCREENING MAMMOGRAM WHERE: [facility] DATE: 01/04/21 TIME: 1:15 PM ARRIVAL TIME  Patient has been made aware.

## 2020-12-27 DIAGNOSIS — Z4689 Encounter for fitting and adjustment of other specified devices: Secondary | ICD-10-CM | POA: Diagnosis not present

## 2021-01-07 DIAGNOSIS — Z1231 Encounter for screening mammogram for malignant neoplasm of breast: Secondary | ICD-10-CM | POA: Diagnosis not present

## 2021-01-27 ENCOUNTER — Encounter: Payer: Self-pay | Admitting: Physician Assistant

## 2021-01-27 ENCOUNTER — Ambulatory Visit (INDEPENDENT_AMBULATORY_CARE_PROVIDER_SITE_OTHER): Payer: Medicare Other | Admitting: Physician Assistant

## 2021-01-27 VITALS — BP 110/70 | HR 85 | Temp 97.3°F | Ht 65.0 in | Wt 157.8 lb

## 2021-01-27 DIAGNOSIS — J060 Acute laryngopharyngitis: Secondary | ICD-10-CM | POA: Diagnosis not present

## 2021-01-27 LAB — POCT INFLUENZA A/B
Influenza A, POC: NEGATIVE
Influenza B, POC: NEGATIVE

## 2021-01-27 LAB — POC COVID19 BINAXNOW: SARS Coronavirus 2 Ag: NEGATIVE

## 2021-01-27 MED ORDER — BENZONATATE 100 MG PO CAPS: 100.0000 mg | ORAL_CAPSULE | Freq: Two times a day (BID) | ORAL | 2 refills | Status: DC | PRN

## 2021-01-27 MED ORDER — TRIAMCINOLONE ACETONIDE 40 MG/ML IJ SUSP
60.0000 mg | Freq: Once | INTRAMUSCULAR | Status: AC
  Administered 2021-01-27: 60 mg via INTRAMUSCULAR

## 2021-01-27 MED ORDER — AZITHROMYCIN 250 MG PO TABS: ORAL_TABLET | ORAL | 0 refills | Status: AC

## 2021-01-27 NOTE — Progress Notes (Signed)
Acute Office Visit  Subjective:    Patient ID: [name], female    DOB: [birth year], 70 y.o.   MRN: [id number]  Chief Complaint  Patient presents with   Cough   Nasal Congestion    HPI: Patient is in today for complaints of sinus pressure, pnd and cough - states that symptoms started a few days ago - has not had a fever Appetite normal - denies wheezing or shortness of breath  Past Medical History:  Diagnosis Date   Abnormal chest x-ray 01/19/2020   Acute laryngopharyngitis 03/18/2020   Aortic atherosclerosis ([place]) 01/28/2020   Arthritis    Cardiac murmur 03/01/2020   COPD (chronic obstructive pulmonary disease) (HCC)    Coronary artery calcification seen on CT scan 03/01/2020   Cough 01/12/2020   COVID-19 03/10/2020   Dyspnea    on exertion,after getting covid   Family history of adverse reaction to anesthesia    brothr had n/v   GERD (gastroesophageal reflux disease)    Hiatal hernia    Hyperlipemia    Pneumonia    Pre-operative cardiovascular examination 03/01/2020   Right lower lobe lung mass 01/28/2020   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 No pertinent surgical history.  Family History  Problem Relation Age of Onset   Heart disease Mother    Heart attack Mother    Cancer Father    Diabetes Father    Non-Hodgkin's lymphoma Brother    High Cholesterol Brother    High Cholesterol Brother    Heart disease Brother    Heart attack Brother     Social History   Socioeconomic History   Marital status: Widowed    Spouse name: Not on file   Number of children: Not on file   Years of education: Not on file   Highest education level: Not on file  Occupational History   Not on file  Tobacco Use   Smoking status: Former    Years: 50.00    Types: Cigarettes    Quit date: 03/22/2017    Years since quitting: 3.8   Smokeless tobacco: Never  Vaping Use   Vaping Use: Never used   Substance and Sexual Activity   Alcohol use: Not Currently   Drug use: Never   Sexual activity: Not Currently  Other Topics Concern   Not on file  Social History Narrative   Not on file   Social Determinants of Health   Financial Resource Strain: Not on file  Food Insecurity: Not on file  Transportation Needs: Not on file  Physical Activity: Not on file  Stress: Not on file  Social Connections: Not on file  Intimate Partner Violence: Not on file    Outpatient Medications Prior to Visit  Medication Sig Dispense Refill   Ascorbic Acid (VITAMIN C PO) Take 3 tablets by mouth daily.     cetirizine (ZYRTEC) 10 MG tablet Take 10 mg by mouth daily.     Coenzyme Q10 (CO Q-10 PO) Take 2 tablets by mouth daily.     montelukast (SINGULAIR) 10 MG tablet TAKE 1 TABLET BY MOUTH EVERYDAY AT BEDTIME 90 tablet 1   Multiple Vitamin (MULTIVITAMIN ADULT PO) Take 2 tablets by mouth daily.     Multiple Vitamins-Minerals (VITAMIN D3 COMPLETE PO) Take by mouth.     mupirocin ointment (BACTROBAN) 2 % Apply 1 application topically 2 (two) times daily. 22 g 2   omeprazole (PRILOSEC)  20 MG capsule TAKE 1 CAPSULE (20 MG TOTAL) BY MOUTH 2 (TWO) TIMES DAILY BEFORE A MEAL. 180 capsule 1   rosuvastatin (CRESTOR) 5 MG tablet TAKE 1 TABLET BY MOUTH EVERY DAY 90 tablet 0   VITAMIN D PO Take 2 tablets by mouth daily.     No facility-administered medications prior to visit.    Allergies  Allergen Reactions   Codeine Hives   Surgical Lubricant Hives    Review of Systems CONSTITUTIONAL: Negative for chills, fatigue, fever, unintentional weight gain and unintentional weight loss.  E/N/T: see HPI CARDIOVASCULAR: Negative for chest pain, dizziness, palpitations and pedal edema.  RESPIRATORY: see HPI GASTROINTESTINAL: Negative for abdominal pain, acid reflux symptoms, constipation, diarrhea, nausea and vomiting.       Objective:    Physical Exam PHYSICAL EXAM:   VS: BP 110/70 (BP Location: Left Arm,  Patient Position: Sitting, Cuff Size: Normal)   Pulse 85   Temp (!) 97.3 F (36.3 C) (Temporal)   Ht '5\' 5"'  (1.651 m)   Wt 157 lb 12.8 oz (71.6 kg)   SpO2 99%   BMI 26.26 kg/m   GEN: Well nourished, well developed, in no acute distress  HEENT: normal external ears and nose - normal external auditory canals and TMS -  - Lips, Teeth and Gums - normal  Oropharynx - erythema/pnd Cardiac: RRR; no murmurs, rubs, or gallops,no edema - no significant varicosities Respiratory:  faint exp rhonchi noted  Office Visit on 01/27/2021  Component Date Value Ref Range Status   SARS Coronavirus 2 Ag 01/27/2021 Negative  Negative Final   Influenza A, POC 01/27/2021 Negative  Negative Final   Influenza B, POC 01/27/2021 Negative  Negative Final     BP 110/70 (BP Location: Left Arm, Patient Position: Sitting, Cuff Size: Normal)   Pulse 85   Temp (!) 97.3 F (36.3 C) (Temporal)   Ht '5\' 5"'  (1.651 m)   Wt 157 lb 12.8 oz (71.6 kg)   SpO2 99%   BMI 26.26 kg/m  Wt Readings from Last 3 Encounters:  01/27/21 157 lb 12.8 oz (71.6 kg)  12/10/20 155 lb (70.3 kg)  11/10/20 151 lb 9.6 oz (68.8 kg)    There are no preventive care reminders to display for this patient.  There are no preventive care reminders to display for this patient.   Lab Results  Component Value Date   TSH 3.830 06/09/2020   Lab Results  Component Value Date   WBC 6.8 12/10/2020   HGB 13.2 12/10/2020   HCT 41.9 12/10/2020   MCV 77 (L) 12/10/2020   PLT 358 12/10/2020   Lab Results  Component Value Date   NA 139 12/10/2020   K 4.3 12/10/2020   CO2 25 12/10/2020   GLUCOSE 99 12/10/2020   BUN 15 12/10/2020   CREATININE 0.93 12/10/2020   BILITOT 0.4 12/10/2020   ALKPHOS 77 12/10/2020   AST 27 12/10/2020   ALT 24 12/10/2020   PROT 7.2 12/10/2020   ALBUMIN 4.5 12/10/2020   CALCIUM 9.0 12/10/2020   ANIONGAP 7 03/31/2020   EGFR 65 12/10/2020   Lab Results  Component Value Date   CHOL 257 (H) 12/10/2020   Lab  Results  Component Value Date   HDL 50 12/10/2020   Lab Results  Component Value Date   LDLCALC 170 (H) 12/10/2020   Lab Results  Component Value Date   TRIG 202 (H) 12/10/2020   Lab Results  Component Value Date   CHOLHDL 5.1 (H)  12/10/2020   No results found for: HGBA1C     Assessment & Plan:   Problem List Items Addressed This Visit       Respiratory   Acute laryngopharyngitis - Primary   Relevant Medications   triamcinolone acetonide (KENALOG-40) injection 60 mg (Start on 01/27/2021 10:15 AM)   azithromycin (ZITHROMAX) 250 MG tablet   benzonatate (TESSALON) 100 MG capsule   Other Relevant Orders   POC COVID-19 BinaxNow (Completed)   POCT Influenza A/B (Completed)   Meds ordered this encounter  Medications   triamcinolone acetonide (KENALOG-40) injection 60 mg   azithromycin (ZITHROMAX) 250 MG tablet    Sig: Take 2 tablets on day 1, then 1 tablet daily on days 2 through 5    Dispense:  6 tablet    Refill:  0    Order Specific Question:   Supervising Provider    Answer:   Shelton Silvas   benzonatate (TESSALON) 100 MG capsule    Sig: Take 1 capsule (100 mg total) by mouth 2 (two) times daily as needed for cough.    Dispense:  20 capsule    Refill:  2    Order Specific Question:   Supervising Provider    AnswerShelton Silvas    Orders Placed This Encounter  Procedures   POC COVID-19 BinaxNow   POCT Influenza A/B     Follow-up: Return if symptoms worsen or fail to improve.  An After Visit Summary was printed and given to the patient.  Yetta Flock [facility] [phone]

## 2021-02-07 DIAGNOSIS — H5203 Hypermetropia, bilateral: Secondary | ICD-10-CM | POA: Diagnosis not present

## 2021-02-07 DIAGNOSIS — H25013 Cortical age-related cataract, bilateral: Secondary | ICD-10-CM | POA: Diagnosis not present

## 2021-02-15 ENCOUNTER — Other Ambulatory Visit: Payer: Self-pay | Admitting: Thoracic Surgery (Cardiothoracic Vascular Surgery)

## 2021-02-15 DIAGNOSIS — C3491 Malignant neoplasm of unspecified part of right bronchus or lung: Secondary | ICD-10-CM

## 2021-02-17 ENCOUNTER — Encounter: Payer: Self-pay | Admitting: Physician Assistant

## 2021-02-17 ENCOUNTER — Ambulatory Visit (INDEPENDENT_AMBULATORY_CARE_PROVIDER_SITE_OTHER): Payer: Medicare Other | Admitting: Physician Assistant

## 2021-02-17 VITALS — BP 102/64 | HR 77 | Resp 18 | Ht 67.5 in | Wt 161.2 lb

## 2021-02-17 DIAGNOSIS — J40 Bronchitis, not specified as acute or chronic: Secondary | ICD-10-CM

## 2021-02-17 LAB — POCT INFLUENZA A/B
Influenza A, POC: NEGATIVE
Influenza B, POC: NEGATIVE

## 2021-02-17 LAB — POC COVID19 BINAXNOW: SARS Coronavirus 2 Ag: NEGATIVE

## 2021-02-17 MED ORDER — TRELEGY ELLIPTA 100-62.5-25 MCG/ACT IN AEPB: 1.0000 | INHALATION_SPRAY | Freq: Every day | RESPIRATORY_TRACT | 0 refills | Status: DC

## 2021-02-17 NOTE — Progress Notes (Signed)
Acute Office Visit  Subjective:    Patient ID: [name], female    DOB: Jun 14, 1950, 70 y.o.   MRN: [id number]  Chief Complaint  Patient presents with   Shortness of Breath        Cough    HPI Patient is in today for complaints of uri/cough - she was seen at Urgent care on Monday and diagnosed with bronchitis - was given cough medication, amoxil and given kenalog shot -- she actually is feeling overall better today - has had occasional wheezing Denies fever/malaise  Past Medical History:  Diagnosis Date   Abnormal chest x-ray 01/19/2020   Acute laryngopharyngitis 03/18/2020   Aortic atherosclerosis ([place]) 01/28/2020   Arthritis    Cardiac murmur 03/01/2020   COPD (chronic obstructive pulmonary disease) ([place])    Coronary artery calcification seen on CT scan 03/01/2020   Cough 01/12/2020   COVID-19 03/10/2020   Dyspnea    on exertion,after getting covid   Family history of adverse reaction to anesthesia    brothr had n/v   GERD (gastroesophageal reflux disease)    Hiatal hernia    Hyperlipemia    Pneumonia    Pre-operative cardiovascular examination 03/01/2020   Right lower lobe lung mass 01/28/2020   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  Family History  Problem Relation Age of Onset   Heart disease Mother    Heart attack Mother    Cancer Father    Diabetes Father    Non-Hodgkin's lymphoma Brother    High Cholesterol Brother    High Cholesterol Brother    Heart disease Brother    Heart attack Brother     Social History   Socioeconomic History   Marital status: Widowed    Spouse name: Not on file   Number of children: Not on file   Years of education: Not on file   Highest education level: Not on file  Occupational History   Not on file  Tobacco Use   Smoking status: Former    Years: 50.00    Types: Cigarettes    Quit date: 03/22/2017    Years since quitting:  3.9   Smokeless tobacco: Never  Vaping Use   Vaping Use: Never used  Substance and Sexual Activity   Alcohol use: Not Currently   Drug use: Never   Sexual activity: Not Currently  Other Topics Concern   Not on file  Social History Narrative   Not on file   Social Determinants of Health   Financial Resource Strain: Not on file  Food Insecurity: Not on file  Transportation Needs: Not on file  Physical Activity: Not on file  Stress: Not on file  Social Connections: Not on file  Intimate Partner Violence: Not on file     Current Outpatient Medications:    Fluticasone-Umeclidin-Vilant (TRELEGY ELLIPTA) 100-62.5-25 MCG/ACT AEPB, Inhale 1 puff into the lungs daily., Disp: 1 each, Rfl: 0   Ascorbic Acid (VITAMIN C PO), Take 3 tablets by mouth daily., Disp: , Rfl:    benzonatate (TESSALON) 100 MG capsule, Take 1 capsule (100 mg total) by mouth 2 (two) times daily as needed for cough., Disp: 20 capsule, Rfl: 2   cetirizine (ZYRTEC) 10 MG tablet, Take 10 mg by mouth daily., Disp: , Rfl:    Coenzyme Q10 (CO Q-10 PO), Take 2 tablets by mouth daily., Disp: , Rfl:    montelukast (  SINGULAIR) 10 MG tablet, TAKE 1 TABLET BY MOUTH EVERYDAY AT BEDTIME, Disp: 90 tablet, Rfl: 1   Multiple Vitamin (MULTIVITAMIN ADULT PO), Take 2 tablets by mouth daily., Disp: , Rfl:    Multiple Vitamins-Minerals (VITAMIN D3 COMPLETE PO), Take by mouth., Disp: , Rfl:    mupirocin ointment (BACTROBAN) 2 %, Apply 1 application topically 2 (two) times daily., Disp: 22 g, Rfl: 2   omeprazole (PRILOSEC) 20 MG capsule, TAKE 1 CAPSULE (20 MG TOTAL) BY MOUTH 2 (TWO) TIMES DAILY BEFORE A MEAL., Disp: 180 capsule, Rfl: 1   rosuvastatin (CRESTOR) 5 MG tablet, TAKE 1 TABLET BY MOUTH EVERY DAY, Disp: 90 tablet, Rfl: 0   VITAMIN D PO, Take 2 tablets by mouth daily., Disp: , Rfl:    Allergies  Allergen Reactions   Codeine Hives   Surgical Lubricant Hives    CONSTITUTIONAL: Negative for chills, fatigue, fever, unintentional  weight gain and unintentional weight loss.  E/N/T: see HPI CARDIOVASCULAR: Negative for chest pain, dizziness, palpitations and pedal edema.  RESPIRATORY: see HPI GASTROINTESTINAL: Negative for abdominal pain, acid reflux symptoms, constipation, diarrhea, nausea and vomiting.       Objective:    PHYSICAL EXAM:   VS: BP 102/64    Pulse 77    Resp 18    Ht 5' 7.5" (1.715 m)    Wt 161 lb 3.2 oz (73.1 kg)    SpO2 97%    BMI 24.87 kg/m   GEN: Well nourished, well developed, in no acute distress  HEENT: normal external ears and nose - normal external auditory canals and TMS - - Lips, Teeth and Gums - normal  Oropharynx - erythema/pnd Cardiac: RRR; no murmurs, rubs, or gallops,no edema -  Respiratory:  faint wheezes noted - clears with coughing Skin: warm and dry, no rash  Psych: euthymic mood, appropriate affect and demeanor  Office Visit on 02/17/2021  Component Date Value Ref Range Status   Influenza A, POC 02/17/2021 Negative  Negative Final   Influenza B, POC 02/17/2021 Negative  Negative Final   SARS Coronavirus 2 Ag 02/17/2021 Negative  Negative Final     Wt Readings from Last 3 Encounters:  02/17/21 161 lb 3.2 oz (73.1 kg)  01/27/21 157 lb 12.8 oz (71.6 kg)  12/10/20 155 lb (70.3 kg)    There are no preventive care reminders to display for this patient.  There are no preventive care reminders to display for this patient.        Assessment & Plan:   Problem List Items Addressed This Visit       Respiratory   Acute bronchitis Continue amoxil and tessalon Sample given of trelegy to take one inhalation qd Follow up if symptoms persist/worsen     Meds ordered this encounter  Medications   Fluticasone-Umeclidin-Vilant (TRELEGY ELLIPTA) 100-62.5-25 MCG/ACT AEPB    Sig: Inhale 1 puff into the lungs daily.    Dispense:  1 each    Refill:  0    Order Specific Question:   Supervising Provider    Answer:   Shelton Silvas     [place], PA-C

## 2021-02-20 HISTORY — PX: CATARACT EXTRACTION: SUR2

## 2021-02-25 DIAGNOSIS — H25813 Combined forms of age-related cataract, bilateral: Secondary | ICD-10-CM | POA: Diagnosis not present

## 2021-02-25 DIAGNOSIS — Z01818 Encounter for other preprocedural examination: Secondary | ICD-10-CM | POA: Diagnosis not present

## 2021-02-25 DIAGNOSIS — H25811 Combined forms of age-related cataract, right eye: Secondary | ICD-10-CM | POA: Diagnosis not present

## 2021-02-25 DIAGNOSIS — H40033 Anatomical narrow angle, bilateral: Secondary | ICD-10-CM | POA: Diagnosis not present

## 2021-03-17 DIAGNOSIS — Z4689 Encounter for fitting and adjustment of other specified devices: Secondary | ICD-10-CM | POA: Diagnosis not present

## 2021-03-18 ENCOUNTER — Other Ambulatory Visit: Payer: Self-pay

## 2021-03-18 ENCOUNTER — Other Ambulatory Visit: Payer: Self-pay | Admitting: Physician Assistant

## 2021-03-18 DIAGNOSIS — I700 Atherosclerosis of aorta: Secondary | ICD-10-CM

## 2021-03-18 MED ORDER — MONTELUKAST SODIUM 10 MG PO TABS: ORAL_TABLET | ORAL | 1 refills | Status: DC

## 2021-03-18 MED ORDER — OMEPRAZOLE 20 MG PO CPDR: 20.0000 mg | DELAYED_RELEASE_CAPSULE | Freq: Two times a day (BID) | ORAL | 1 refills | Status: DC

## 2021-03-18 MED ORDER — ROSUVASTATIN CALCIUM 5 MG PO TABS: 5.0000 mg | ORAL_TABLET | Freq: Every day | ORAL | 0 refills | Status: DC

## 2021-03-22 DIAGNOSIS — F17210 Nicotine dependence, cigarettes, uncomplicated: Secondary | ICD-10-CM | POA: Diagnosis not present

## 2021-03-22 DIAGNOSIS — J449 Chronic obstructive pulmonary disease, unspecified: Secondary | ICD-10-CM | POA: Diagnosis not present

## 2021-03-22 DIAGNOSIS — H5203 Hypermetropia, bilateral: Secondary | ICD-10-CM | POA: Diagnosis not present

## 2021-03-22 DIAGNOSIS — H40033 Anatomical narrow angle, bilateral: Secondary | ICD-10-CM | POA: Diagnosis not present

## 2021-03-22 DIAGNOSIS — H2511 Age-related nuclear cataract, right eye: Secondary | ICD-10-CM | POA: Diagnosis not present

## 2021-03-22 DIAGNOSIS — H25811 Combined forms of age-related cataract, right eye: Secondary | ICD-10-CM | POA: Diagnosis not present

## 2021-03-22 DIAGNOSIS — H25812 Combined forms of age-related cataract, left eye: Secondary | ICD-10-CM | POA: Diagnosis not present

## 2021-03-22 DIAGNOSIS — H25013 Cortical age-related cataract, bilateral: Secondary | ICD-10-CM | POA: Diagnosis not present

## 2021-03-22 DIAGNOSIS — H259 Unspecified age-related cataract: Secondary | ICD-10-CM | POA: Diagnosis not present

## 2021-03-28 ENCOUNTER — Other Ambulatory Visit: Payer: Self-pay | Admitting: Physician Assistant

## 2021-03-28 DIAGNOSIS — I700 Atherosclerosis of aorta: Secondary | ICD-10-CM

## 2021-03-28 MED ORDER — MONTELUKAST SODIUM 10 MG PO TABS: ORAL_TABLET | ORAL | 1 refills | Status: DC

## 2021-03-28 MED ORDER — ROSUVASTATIN CALCIUM 5 MG PO TABS: 5.0000 mg | ORAL_TABLET | Freq: Every day | ORAL | 1 refills | Status: DC

## 2021-03-28 MED ORDER — OMEPRAZOLE 20 MG PO CPDR: 20.0000 mg | DELAYED_RELEASE_CAPSULE | Freq: Two times a day (BID) | ORAL | 1 refills | Status: DC

## 2021-04-05 ENCOUNTER — Telehealth: Payer: Self-pay

## 2021-04-05 DIAGNOSIS — E785 Hyperlipidemia, unspecified: Secondary | ICD-10-CM | POA: Diagnosis not present

## 2021-04-05 DIAGNOSIS — H259 Unspecified age-related cataract: Secondary | ICD-10-CM | POA: Diagnosis not present

## 2021-04-05 DIAGNOSIS — H25811 Combined forms of age-related cataract, right eye: Secondary | ICD-10-CM | POA: Diagnosis not present

## 2021-04-05 DIAGNOSIS — Z85118 Personal history of other malignant neoplasm of bronchus and lung: Secondary | ICD-10-CM | POA: Diagnosis not present

## 2021-04-05 DIAGNOSIS — H25812 Combined forms of age-related cataract, left eye: Secondary | ICD-10-CM | POA: Diagnosis not present

## 2021-04-05 NOTE — Telephone Encounter (Signed)
Patient requesting a note for jury duty. She feels she is not fit to sit. Her memory has not been the best. She had one cataract surgery two weeks ago and the other this morning. Also had lung surgery one year ago. Please advise.   Royce Macadamia, [place] 04/05/21 11:33 AM

## 2021-04-05 NOTE — Telephone Encounter (Signed)
We usually do not do jury excuses - she can report and tell them her current health issues and then the court will more than likely dismiss

## 2021-04-05 NOTE — Telephone Encounter (Signed)
Patient understood.   [name] 04/05/21 12:32 PM

## 2021-04-19 ENCOUNTER — Ambulatory Visit: Payer: Medicare HMO | Admitting: Thoracic Surgery (Cardiothoracic Vascular Surgery)

## 2021-04-19 ENCOUNTER — Other Ambulatory Visit: Payer: Self-pay

## 2021-04-19 ENCOUNTER — Ambulatory Visit
Admission: RE | Admit: 2021-04-19 | Discharge: 2021-04-19 | Disposition: A | Discharge status: Home / Self Care | Payer: Medicare HMO | Source: Ambulatory Visit | Attending: Thoracic Surgery (Cardiothoracic Vascular Surgery) | Admitting: Thoracic Surgery (Cardiothoracic Vascular Surgery)

## 2021-04-19 VITALS — BP 123/70 | HR 104 | Resp 20 | Wt 166.0 lb

## 2021-04-19 DIAGNOSIS — C3491 Malignant neoplasm of unspecified part of right bronchus or lung: Secondary | ICD-10-CM

## 2021-04-19 DIAGNOSIS — Z85118 Personal history of other malignant neoplasm of bronchus and lung: Secondary | ICD-10-CM | POA: Diagnosis not present

## 2021-04-19 DIAGNOSIS — C3490 Malignant neoplasm of unspecified part of unspecified bronchus or lung: Secondary | ICD-10-CM | POA: Diagnosis not present

## 2021-04-19 NOTE — Progress Notes (Signed)
[address]       [place] [address]             [phone]     HPI: [name] returns for a scheduled follow-up visit 1 year after lobectomy for lung cancer   [name] is a 71 year old woman with a history of tobacco abuse, COPD, stage Ib adenocarcinoma of the lung, status post right middle lobectomy, aortic atherosclerosis, heart murmur, coronary calcification, hiatal hernia, reflux, hyperlipidemia, subclavian stenosis, pneumonia, and COVID-19.  Dr. Servando Snare did a robotic assisted right middle lobectomy in February 2022 for a T2 a, N0, stage Ib adenocarcinoma.  I saw him in follow-up in August.  There is a cystic lesion noted in the pericardial fat pad.  An MR was done which showed this was consistent with a benign pericardial cyst.  In the interim since her last visit she had bronchitis in December.  Other than that she has been feeling well.  She has a very occasional brief burning sensation in the right chest.  She is not taking anything for that.  Past Medical History:  Diagnosis Date   Abnormal chest x-ray 01/19/2020   Acute laryngopharyngitis 03/18/2020   Aortic atherosclerosis ([place]) 01/28/2020   Arthritis    Cardiac murmur 03/01/2020   COPD (chronic obstructive pulmonary disease) (HCC)    Coronary artery calcification seen on CT scan 03/01/2020   Cough 01/12/2020   COVID-19 03/10/2020   Dyspnea    on exertion,after getting covid   Family history of adverse reaction to anesthesia    brothr had n/v   GERD (gastroesophageal reflux disease)    Hiatal hernia    Hyperlipemia    Pneumonia    Pre-operative cardiovascular examination 03/01/2020   Right lower lobe lung mass 01/28/2020   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     cetirizine (ZYRTEC) 10 MG tablet Take 10 mg  by mouth daily.     Coenzyme Q10 (CO Q-10 PO) Take 2 tablets by mouth daily.     montelukast (SINGULAIR) 10 MG tablet TAKE 1 TABLET BY MOUTH EVERYDAY AT BEDTIME Strength: 10 mg 90 tablet 1   Multiple Vitamin (MULTIVITAMIN ADULT PO) Take 2 tablets by mouth daily.     Multiple Vitamins-Minerals (VITAMIN D3 COMPLETE PO) Take by mouth.     mupirocin ointment (BACTROBAN) 2 % Apply 1 application topically 2 (two) times daily. 22 g 2   omeprazole (PRILOSEC) 20 MG capsule Take 1 capsule (20 mg total) by mouth 2 (two) times daily before a meal. 180 capsule 1   rosuvastatin (CRESTOR) 5 MG tablet Take 1 tablet (5 mg total) by mouth daily. 90 tablet 1   VITAMIN D PO Take 2 tablets by mouth daily.     benzonatate (TESSALON) 100 MG capsule Take 1 capsule (100 mg total) by mouth 2 (two) times daily as needed for cough. (Patient not taking: Reported on 04/19/2021) 20 capsule 2   Fluticasone-Umeclidin-Vilant (TRELEGY ELLIPTA) 100-62.5-25 MCG/ACT AEPB Inhale 1 puff into the lungs daily. (Patient not taking: Reported on 04/19/2021) 1 each 0   No current facility-administered medications for this visit.    Physical Exam BP 123/70 (BP Location: Left Arm, Patient Position: Sitting)    Pulse (!) 104    Resp 20  Wt 166 lb (75.3 kg)    SpO2 97%    BMI 25.40 kg/m  71 year old woman in no acute distress Alert and oriented x3 with no focal deficits Lungs clear with equal breath sounds bilaterally Cardiac regular rate and rhythm with 2/6 murmur  Diagnostic Tests: CT CHEST WITHOUT CONTRAST   TECHNIQUE: Multidetector CT imaging of the chest was performed following the standard protocol without IV contrast.   RADIATION DOSE REDUCTION: This exam was performed according to the departmental dose-optimization program which includes automated exposure control, adjustment of the mA and/or kV according to patient size and/or use of iterative reconstruction technique.   COMPARISON:  CT chest dated September 28, 2020    FINDINGS: Cardiovascular: Normal heart size. No pericardial effusion. Three-vessel coronary artery calcifications. Atherosclerotic disease of the thoracic aorta.   Mediastinum/Nodes: Unchanged cystic lesion of the anterior mediastinum, measures 3.7 x 2.3 cm, unchanged when remeasured in similar plane, previously characterized on prior chest MRI and compatible with benign pericardial cyst. Esophagus and thyroid are unremarkable. No pathologically enlarged lymph nodes seen in the chest.   Lungs/Pleura: Prior right middle lobectomy with no suspicious nodular soft tissue. Remaining central airways are patent. Unchanged biapical pleuroparenchymal scarring. Centrilobular emphysema. Small solid pulmonary of the right upper lobe measuring 6 mm on series 8, image 21.   Upper Abdomen: No acute abnormality. Pericardial cyst partially known to occur unchanged   Musculoskeletal: No chest wall mass or suspicious bone lesions identified.   IMPRESSION: 1. Prior right middle lobectomy with no evidence of recurrent or metastatic disease. 2. Stable solid 6 mm right upper lobe pulmonary nodule. Recommend attention on follow-up per clinical protocol. 3. Stable cystic lesion of the anterior mediastinum, previously characterized on prior MRI and compatible with a pericardial cyst. 4. Coronary artery calcifications, aortic Atherosclerosis (ICD10-I70.0) and Emphysema (ICD10-J43.9).   * onc *     Electronically Signed   By: Yetta Glassman M.D.   On: 04/19/2021 12:11 I personally reviewed the CT images.  No evidence of recurrent disease.  Stable 6 mm right upper lobe nodule.  Pericardial cyst unchanged.  Impression: [name] is a 71 year old woman with a history of tobacco abuse, COPD, stage Ib adenocarcinoma of the lung, status post right middle lobectomy, aortic atherosclerosis, heart murmur, coronary calcification, hiatal hernia, reflux, hyperlipidemia, subclavian stenosis, pneumonia, and  COVID-19.  Dr. Servando Snare did a robotic assisted right middle lobectomy in February 2022.  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  Status post right middle lobectomy.  Now a year out from surgery with no evidence of recurrent disease.  Plan will be CT scans every 6 months for the first 2 years and then annually after that.  Pericardial cyst-unchanged.  No follow-up needed for that specifically, but we will observe on CTs done for surveillance of lung cancer.  Tobacco abuse -quit smoking several years ago.  Coronary and thoracic aortic atherosclerosis-asymptomatic.  She is on a statin.  Plan: Return in 6 months with CT chest  Melrose Nakayama, MD [facility] [phone]

## 2021-04-23 ENCOUNTER — Other Ambulatory Visit: Payer: Self-pay | Admitting: Physician Assistant

## 2021-05-05 DIAGNOSIS — H524 Presbyopia: Secondary | ICD-10-CM | POA: Diagnosis not present

## 2021-05-06 ENCOUNTER — Ambulatory Visit (INDEPENDENT_AMBULATORY_CARE_PROVIDER_SITE_OTHER): Payer: Medicare HMO | Admitting: Nurse Practitioner

## 2021-05-06 ENCOUNTER — Encounter: Payer: Self-pay | Admitting: Nurse Practitioner

## 2021-05-06 VITALS — BP 126/76 | HR 102 | Temp 97.3°F | Ht 67.5 in | Wt 165.0 lb

## 2021-05-06 DIAGNOSIS — R051 Acute cough: Secondary | ICD-10-CM

## 2021-05-06 DIAGNOSIS — J018 Other acute sinusitis: Secondary | ICD-10-CM | POA: Diagnosis not present

## 2021-05-06 DIAGNOSIS — J441 Chronic obstructive pulmonary disease with (acute) exacerbation: Secondary | ICD-10-CM

## 2021-05-06 DIAGNOSIS — Z85118 Personal history of other malignant neoplasm of bronchus and lung: Secondary | ICD-10-CM | POA: Diagnosis not present

## 2021-05-06 DIAGNOSIS — Z87891 Personal history of nicotine dependence: Secondary | ICD-10-CM

## 2021-05-06 LAB — POC COVID19 BINAXNOW: SARS Coronavirus 2 Ag: NEGATIVE

## 2021-05-06 MED ORDER — TRELEGY ELLIPTA 100-62.5-25 MCG/ACT IN AEPB: 1.0000 | INHALATION_SPRAY | Freq: Every day | RESPIRATORY_TRACT | 0 refills | Status: AC

## 2021-05-06 MED ORDER — ALBUTEROL SULFATE HFA 108 (90 BASE) MCG/ACT IN AERS: 2.0000 | INHALATION_SPRAY | Freq: Four times a day (QID) | RESPIRATORY_TRACT | 2 refills | Status: DC | PRN

## 2021-05-06 MED ORDER — AMOXICILLIN 500 MG PO CAPS: 500.0000 mg | ORAL_CAPSULE | Freq: Two times a day (BID) | ORAL | 0 refills | Status: DC

## 2021-05-06 NOTE — Patient Instructions (Addendum)
Rest and push fluids ?Take Amoxicillin 500 mg twice daily for 10 days ?Use Trelegy inhaler daily, rinse mouth afterwards ?Use Albuterol inhaler every 4-6 hours as needed for shortness of breath ?Follow-up as needed ?Seek emergency medical care for severe symptoms ? ?Chronic Obstructive Pulmonary Disease Exacerbation ?Chronic obstructive pulmonary disease (COPD) is a long-term (chronic) lung problem. In COPD, the flow of air from the lungs is limited. ?COPD exacerbations are times that breathing gets worse and you need more than your normal treatment. Without treatment, they can be life-threatening. If they happen often, your lungs can become more damaged. ?What are the causes? ?Having infections that affect your airways and lungs. ?Being exposed to: ?Smoke. ?Air pollution. ?Chemical fumes. ?Dust. ?Things that can cause an allergic reaction (allergens). ?Not taking your usual COPD medicines as told. ?Having medical problems already, such as heart failure or infections not involving the lungs. ?In many cases, the cause is not known. ?What increases the risk? ?Smoking. ?Being an older adult. ?Having frequent prior COPD exacerbations. ?What are the signs or symptoms? ?Increased coughing. ?Increased mucus from your lungs. ?Increased wheezing. ?Increased shortness of breath. ?Fast breathing and finding it hard to breathe. ?Chest tightness. ?Less energy than usual. ?Sleep disruption from symptoms. ?Confusion. ?Increased sleepiness. ?Often, these symptoms happen or get worse even with the use of medicines. ?How is this treated? ?Treatment for this condition depends on how bad it is and the cause of the symptoms. You may need to stay in the hospital for treatment. Treatment may include: ?Taking medicines. ?Using oxygen. ?Being treated with different ways to clear your airway, such as using a mask to deliver oxygen. ?Follow these instructions at home: ?Medicines ?Take over-the-counter and prescription medicines only as told  by your doctor. ?Use all inhaled medicines the correct way. ?If you were prescribed an antibiotic or steroid medicine, take it as told by your doctor. Do not stop taking it even if you start to feel better. ?Lifestyle ?Do not smoke or use any products that contain nicotine or tobacco. If you need help quitting, ask your doctor. ?Eat healthy foods. ?Exercise regularly. ?Get enough sleep. Most adults need 7 or more hours per night. ?Avoid tobacco smoke and other things that can bother your lungs. ?Several times a day, wash your hands with soap and water for at least 20 seconds. If you cannot use soap and water, use hand sanitizer. This may help keep you from getting an infection. ?During flu season, avoid areas that are crowded with people. ?General instructions ?Drink enough fluid to keep your pee (urine) pale yellow. Do not do this if your doctor has told you not to. ?Use a cool mist machine (vaporizer). ?If you use oxygen or a machine that turns medicine into a mist (nebulizer), continue to use it as told. ?Keep all follow-up visits. ?How is this prevented? ?Keep up with shots (vaccinations) as told by your doctor. Be sure to get a yearly flu (influenza) shot. ?If you smoke, quit smoking. Smoking makes the problem worse. ?Follow all instructions for rehabilitation. These are steps you can take to make your body work better. ?Work with your doctor to develop and follow an action plan. This tells you what steps to take when you experience certain symptoms. ?Contact a doctor if: ?Your COPD symptoms get worse than normal. ?Get help right away if: ?You are short of breath and it gets worse, even when you are resting. ?You have trouble talking. ?You have chest pain. ?You cough up blood. ?You have  a fever. ?You keep vomiting. ?You feel weak or you pass out (faint). ?You feel confused. ?You are not able to sleep because of your symptoms. ?You have trouble doing daily activities. ?These symptoms may be an emergency. Get  help right away. Call your local emergency services (911 in the [place] ?Do not wait to see if the symptoms will go away. ?Do not drive yourself to the hospital. ?Summary ?COPD exacerbations are times that breathing gets worse and you need more treatment than normal. ?COPD exacerbations can be very serious and may cause your lungs to become more damaged. ?Do not smoke. If you need help quitting, ask your doctor. ?Stay up to date on your shots. Get a flu shot every year. ?This information is not intended to replace advice given to you by your health care provider. Make sure you discuss any questions you have with your health care provider. ?Document Revised: 12/31/2019 Document Reviewed: 12/16/2019 ?Elsevier Patient Education ? Force. ? ?

## 2021-05-06 NOTE — Progress Notes (Signed)
? ?Acute Office Visit ? ?Subjective:  ? ? Patient ID: [name], female    DOB: 20-May-1950, 71 y.o.   MRN: [id number] ? ?Chief Complaint  ?Patient presents with  ? URI  ? ? ?HPI: ?[name] is a 71 year old Caucasian female that presents with URI symptoms of sinus congestion, rhinorrhea, dyspnea, and productive cough.States sputum is thick, yellow in color. Onset of symptoms was three days ago. Treatment has included Zyrtec, Singulair, and Robitussin Honey. States she has Best boy available. Pt has a past history of COPD and small cell lung CA. Underwent right middle lobectomy 01/2020. Former cigarette smoker.  ? ? ?Past Medical History:  ?Diagnosis Date  ? Abnormal chest x-ray 01/19/2020  ? Acute laryngopharyngitis 03/18/2020  ? Aortic atherosclerosis ([place]) 01/28/2020  ? Arthritis   ? Cardiac murmur 03/01/2020  ? COPD (chronic obstructive pulmonary disease) ([place])   ? Coronary artery calcification seen on CT scan 03/01/2020  ? Cough 01/12/2020  ? COVID-19 03/10/2020  ? Dyspnea   ? on exertion,after getting covid  ? Family history of adverse reaction to anesthesia   ? brothr had n/v  ? GERD (gastroesophageal reflux disease)   ? Hiatal hernia   ? Hyperlipemia   ? Pneumonia   ? Pre-operative cardiovascular examination 03/01/2020  ? Right lower lobe lung mass 01/28/2020  ? 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 Visit for screening mammogram 01/28/2020  ? ? ?No past surgical history on file. ? ?Family History  ?Problem Relation Age of Onset  ? Heart disease Mother   ? Heart attack Mother   ? Cancer Father   ? Diabetes Father   ? Non-Hodgkin's lymphoma Brother   ? High Cholesterol Brother   ? High Cholesterol Brother   ? Heart disease Brother   ? Heart attack Brother   ? ? ?Social History  ? ?Socioeconomic History  ? Marital status: Widowed  ?  Spouse name: Not on file  ? Number of children: Not on file  ? Years of education: Not on file  ? Highest education level: Not on file  ?Occupational History  ? Not on  file  ?Tobacco Use  ? Smoking status: Former  ?  Years: 50.00  ?  Types: Cigarettes  ?  Quit date: 03/22/2017  ?  Years since quitting: 4.1  ? Smokeless tobacco: Never  ?Vaping Use  ? Vaping Use: Never used  ?Substance and Sexual Activity  ? Alcohol use: Not Currently  ? Drug use: Never  ? Sexual activity: Not Currently  ?Other Topics Concern  ? Not on file  ?Social History Narrative  ? Not on file  ? ?Social Determinants of Health  ? ?Financial Resource Strain: Not on file  ?Food Insecurity: Not on file  ?Transportation Needs: Not on file  ?Physical Activity: Not on file  ?Stress: Not on file  ?Social Connections: Not on file  ?Intimate Partner Violence: Not on file  ? ? ?Outpatient Medications Prior to Visit  ?Medication Sig Dispense Refill  ? Ascorbic Acid (VITAMIN C PO) Take 3 tablets by mouth daily.    ? benzonatate (TESSALON) 100 MG capsule Take 1 capsule (100 mg total) by mouth 2 (two) times daily as needed for cough. (Patient not taking: Reported on 04/19/2021) 20 capsule 2  ? cetirizine (ZYRTEC) 10 MG tablet Take 10 mg by mouth daily.    ? Coenzyme Q10 (CO Q-10 PO) Take 2 tablets by mouth daily.    ? Fluticasone-Umeclidin-Vilant (TRELEGY ELLIPTA) 100-62.5-25 MCG/ACT AEPB  Inhale 1 puff into the lungs daily. (Patient not taking: Reported on 04/19/2021) 1 each 0  ? montelukast (SINGULAIR) 10 MG tablet TAKE 1 TABLET BY MOUTH EVERYDAY AT BEDTIME Strength: 10 mg 90 tablet 1  ? Multiple Vitamin (MULTIVITAMIN ADULT PO) Take 2 tablets by mouth daily.    ? Multiple Vitamins-Minerals (VITAMIN D3 COMPLETE PO) Take by mouth.    ? mupirocin ointment (BACTROBAN) 2 % Apply 1 application topically 2 (two) times daily. 22 g 2  ? omeprazole (PRILOSEC) 20 MG capsule TAKE 1 CAPSULE (20 MG TOTAL) BY MOUTH 2 (TWO) TIMES DAILY BEFORE A MEAL. 180 capsule 1  ? rosuvastatin (CRESTOR) 5 MG tablet Take 1 tablet (5 mg total) by mouth daily. 90 tablet 1  ? VITAMIN D PO Take 2 tablets by mouth daily.    ? ?No facility-administered  medications prior to visit.  ? ? ?Allergies  ?Allergen Reactions  ? Codeine Hives  ? Surgical Lubricant Hives  ? ? ?Review of Systems  ?Constitutional:  Positive for appetite change (decreased appetite). Negative for chills, fatigue and fever.  ?HENT:  Positive for congestion and postnasal drip. Negative for ear pain, rhinorrhea, sinus pressure, sinus pain and sore throat.   ?Respiratory:  Positive for cough and shortness of breath.   ?Cardiovascular:  Negative for chest pain.  ?Gastrointestinal:  Negative for diarrhea and nausea.  ?Neurological:  Negative for dizziness and headaches.  ? ?   ?Objective:  ?  ?Physical Exam ?Vitals reviewed.  ?Constitutional:   ?   Appearance: She is ill-appearing.  ?HENT:  ?   Head: Normocephalic.  ?   Right Ear: Tympanic membrane normal.  ?   Left Ear: Tympanic membrane normal.  ?   Nose: Congestion and rhinorrhea present.  ?   Mouth/Throat:  ?   Mouth: Mucous membranes are moist.  ?   Pharynx: Posterior oropharyngeal erythema present.  ?Eyes:  ?   Pupils: Pupils are equal, round, and reactive to light.  ?Cardiovascular:  ?   Rate and Rhythm: Regular rhythm. Tachycardia present.  ?   Pulses: Normal pulses.  ?   Heart sounds: Normal heart sounds.  ?Pulmonary:  ?   Effort: Pulmonary effort is normal.  ?   Comments: Diminished breath sounds bilaterally ?Abdominal:  ?   General: Bowel sounds are normal.  ?   Palpations: Abdomen is soft.  ?Musculoskeletal:     ?   General: Normal range of motion.  ?Skin: ?   General: Skin is warm and dry.  ?   Capillary Refill: Capillary refill takes less than 2 seconds.  ?Neurological:  ?   General: No focal deficit present.  ?   Mental Status: She is alert and oriented to person, place, and time.  ?Psychiatric:     ?   Mood and Affect: Mood normal.     ?   Behavior: Behavior normal.  ? ? ?BP 126/76   Pulse (!) 102   Temp (!) 97.3 ?F (36.3 ?C)   Ht 5' 7.5" (1.715 m)   Wt 165 lb (74.8 kg)   SpO2 98%   BMI 25.46 kg/m?   ?Wt Readings from Last 3  Encounters:  ?05/06/21 165 lb (74.8 kg)  ?04/19/21 166 lb (75.3 kg)  ?02/17/21 161 lb 3.2 oz (73.1 kg)  ? ? ?Lab Results  ?Component Value Date  ? TSH 3.830 06/09/2020  ? ?Lab Results  ?Component Value Date  ? WBC 6.8 12/10/2020  ? HGB 13.2 12/10/2020  ? HCT  41.9 12/10/2020  ? MCV 77 (L) 12/10/2020  ? PLT 358 12/10/2020  ? ?Lab Results  ?Component Value Date  ? NA 139 12/10/2020  ? K 4.3 12/10/2020  ? CO2 25 12/10/2020  ? GLUCOSE 99 12/10/2020  ? BUN 15 12/10/2020  ? CREATININE 0.93 12/10/2020  ? BILITOT 0.4 12/10/2020  ? ALKPHOS 77 12/10/2020  ? AST 27 12/10/2020  ? ALT 24 12/10/2020  ? PROT 7.2 12/10/2020  ? ALBUMIN 4.5 12/10/2020  ? CALCIUM 9.0 12/10/2020  ? ANIONGAP 7 03/31/2020  ? EGFR 65 12/10/2020  ? ?Lab Results  ?Component Value Date  ? CHOL 257 (H) 12/10/2020  ? ?Lab Results  ?Component Value Date  ? HDL 50 12/10/2020  ? ?Lab Results  ?Component Value Date  ? LDLCALC 170 (H) 12/10/2020  ? ?Lab Results  ?Component Value Date  ? TRIG 202 (H) 12/10/2020  ? ?Lab Results  ?Component Value Date  ? CHOLHDL 5.1 (H) 12/10/2020  ? ? ? ?   ?Assessment & Plan:  ? ?1. COPD with exacerbation ([place]) ?- Fluticasone-Umeclidin-Vilant (TRELEGY ELLIPTA) 100-62.5-25 MCG/ACT AEPB; Inhale 1 puff into the lungs daily.  Dispense: 2 each; Refill: 0 ?- amoxicillin (AMOXIL) 500 MG capsule; Take 1 capsule (500 mg total) by mouth 2 (two) times daily.  Dispense: 20 capsule; Refill: 0 ?- albuterol (VENTOLIN HFA) 108 (90 Base) MCG/ACT inhaler; Inhale 2 puffs into the lungs every 6 (six) hours as needed for wheezing or shortness of breath.  Dispense: 8 g; Refill: 2 ? ?2. Acute non-recurrent sinusitis of other sinus ?- amoxicillin (AMOXIL) 500 MG capsule; Take 1 capsule (500 mg total) by mouth 2 (two) times daily.  Dispense: 20 capsule; Refill: 0 ? ?3. Acute cough ?- POC COVID-19 BinaxNow-NEGATIVE ? ?4. History of lung cancer in adulthood ? ?5. Former smoker ?  ? ? ?  ?Rest and push fluids ?Take Amoxicillin 500 mg twice daily for 10  days ?Use Trelegy inhaler daily, rinse mouth afterwards ?Use Albuterol inhaler every 4-6 hours as needed for shortness of breath ?Follow-up as needed ?Seek emergency medical care for severe symptoms ? ?Follow-up: PRN ? ?An After

## 2021-05-16 DIAGNOSIS — Z4689 Encounter for fitting and adjustment of other specified devices: Secondary | ICD-10-CM | POA: Diagnosis not present

## 2021-05-16 DIAGNOSIS — N898 Other specified noninflammatory disorders of vagina: Secondary | ICD-10-CM | POA: Diagnosis not present

## 2021-05-18 ENCOUNTER — Other Ambulatory Visit: Payer: Self-pay

## 2021-05-18 ENCOUNTER — Encounter: Payer: Self-pay | Admitting: Physician Assistant

## 2021-05-18 ENCOUNTER — Ambulatory Visit (INDEPENDENT_AMBULATORY_CARE_PROVIDER_SITE_OTHER): Payer: Medicare HMO | Admitting: Physician Assistant

## 2021-05-18 VITALS — BP 132/78 | HR 82 | Temp 98.2°F | Ht 65.0 in | Wt 164.0 lb

## 2021-05-18 DIAGNOSIS — J060 Acute laryngopharyngitis: Secondary | ICD-10-CM

## 2021-05-18 MED ORDER — AZITHROMYCIN 250 MG PO TABS: ORAL_TABLET | ORAL | 0 refills | Status: AC

## 2021-05-18 MED ORDER — TRIAMCINOLONE ACETONIDE 40 MG/ML IJ SUSP
60.0000 mg | Freq: Once | INTRAMUSCULAR | Status: AC
  Administered 2021-05-18: 60 mg via INTRAMUSCULAR

## 2021-05-18 NOTE — Progress Notes (Signed)
? ?Acute Office Visit ? ?Subjective:  ? ? Patient ID: [name], female    DOB: 01-23-1951, 71 y.o.   MRN: [id number] ? ?Chief Complaint  ?Patient presents with  ? Side pain after being sick with URI  ? ? ?HPI: ?Patient is in today for complaints of continued cough and congestion - she was treated with amoxil 581m 2 weeks ago but is still having productive cough and soreness in ribs from coughing ?No further fever ? ?Past Medical History:  ?Diagnosis Date  ? Abnormal chest x-ray 01/19/2020  ? Acute laryngopharyngitis 03/18/2020  ? [place] 01/28[place]  ? Arthritis   ? Cardiac murmur 03/01/2020  ? [place] disease) [place]   ? Coronary artery calcification seen on CT scan 03/01/2020  ? Cough 01/12/2020  ? COVID-19 03/10/2020  ? Dyspnea   ? on exertion,after getting covid  ? Family history of adverse reaction to anesthesia   ? brothr had n/v  ? GERD (gastroesophageal reflux disease)   ? Hiatal hernia   ? Hyperlipemia   ? Pneumonia   ? Pre-operative cardiovascular examination 03/01/2020  ? Right lower lobe lung mass 01/28/2020  ? [place]cl[place] 01/28/2020  ? Visit for screening mammogram 01/28/2020  ? ? ?History reviewed. No pertinent surgical history. ? ?Family History  ?Problem Relation Age of Onset  ? Heart disease Mother   ? Heart attack Mother   ? Cancer Father   ? Diabetes Father   ? Non-Hodgkin's lymphoma Brother   ? High Cholesterol Brother   ? High Cholesterol Brother   ? Heart disease Brother   ? Heart attack Brother   ? ? ?Social History  ? ?Socioeconomic History  ? Marital status: Widowed  ?  Spouse name: Not on file  ? Number of children: Not on file  ? Years of education: Not on file  ? Highest education level: Not on file  ?Occupational History  ? Not on file  ?Tobacco Use  ? Smoking status: Former  ?  Years: 50.00  ?  Types: Cigarettes  ?  Quit date: 03/22/2017  ?  Years since quitting: 4.1  ? Smokeless tobacco: Never  ?Vaping Use  ? Vaping  Use: Never used  ?Substance and Sexual Activity  ? Alcohol use: Not Currently  ? Drug use: Never  ? Sexual activity: Not Currently  ?Other Topics Concern  ? Not on file  ?Social History Narrative  ? Not on file  ? ?Social Determinants of Health  ? ?Financial Resource Strain: Not on file  ?Food Insecurity: Not on file  ?Transportation Needs: Not on file  ?Physical Activity: Not on file  ?Stress: Not on file  ?Social Connections: Not on file  ?Intimate Partner Violence: Not on file  ? ? ?Outpatient Medications Prior to Visit  ?Medication Sig Dispense Refill  ? albuterol (VENTOLIN HFA) 108 (90 Base) MCG/ACT inhaler Inhale 2 puffs into the lungs every 6 (six) hours as needed for wheezing or shortness of breath. 8 g 2  ? Ascorbic Acid (VITAMIN C PO) Take 3 tablets by mouth daily.    ? cetirizine (ZYRTEC) 10 MG tablet Take 10 mg by mouth daily.    ? Coenzyme Q10 (CO Q-10 PO) Take 2 tablets by mouth daily.    ? Fluticasone-Umeclidin-Vilant (TRELEGY ELLIPTA) 100-62.5-25 MCG/ACT AEPB Inhale 1 puff into the lungs daily. 2 each 0  ? Misc Natural Products (CRAMP RELEAF PO) Take by mouth.    ? montelukast (SINGULAIR)  10 MG tablet TAKE 1 TABLET BY MOUTH EVERYDAY AT BEDTIME Strength: 10 mg 90 tablet 1  ? Multiple Vitamin (MULTIVITAMIN ADULT PO) Take 2 tablets by mouth daily.    ? Multiple Vitamins-Minerals (VITAMIN D3 COMPLETE PO) Take by mouth.    ? mupirocin ointment (BACTROBAN) 2 % Apply 1 application topically 2 (two) times daily. 22 g 2  ? omeprazole (PRILOSEC) 20 MG capsule TAKE 1 CAPSULE (20 MG TOTAL) BY MOUTH 2 (TWO) TIMES DAILY BEFORE A MEAL. 180 capsule 1  ? rosuvastatin (CRESTOR) 5 MG tablet Take 1 tablet (5 mg total) by mouth daily. 90 tablet 1  ? VITAMIN D PO Take 2 tablets by mouth daily.    ? amoxicillin (AMOXIL) 500 MG capsule Take 1 capsule (500 mg total) by mouth 2 (two) times daily. 20 capsule 0  ? benzonatate (TESSALON) 100 MG capsule Take 1 capsule (100 mg total) by mouth 2 (two) times daily as needed for  cough. (Patient not taking: Reported on 04/19/2021) 20 capsule 2  ? Fluticasone-Umeclidin-Vilant (TRELEGY ELLIPTA) 100-62.5-25 MCG/ACT AEPB Inhale 1 puff into the lungs daily. (Patient not taking: Reported on 04/19/2021) 1 each 0  ? ?No facility-administered medications prior to visit.  ? ? ?Allergies  ?Allergen Reactions  ? Codeine Hives  ? Surgical Lubricant Hives  ? ? ?Review of Systems ?CONSTITUTIONAL: Negative for chills, fatigue, fever, unintentional weight gain and unintentional weight loss.  ?E/N/T: see HPI ?CARDIOVASCULAR: Negative for chest pain, dizziness, palpitations and pedal edema.  ?RESPIRATORY:see HPI ?GASTROINTESTINAL: Negative for abdominal pain, acid reflux symptoms, constipation, diarrhea, nausea and vomiting.  ? ?   ? ?   ?Objective:  ?  ?Physical Exam ?PHYSICAL EXAM:  ? ?VS: BP 132/78 (BP Location: Left Arm, Patient Position: Sitting)   Pulse 82   Temp 98.2 ?F (36.8 ?C) (Oral)   Ht _0  (1.651 m)   Wt 164 lb (74.4 kg)   SpO2 99%   BMI 27.29 kg/m?  ? ?GEN: Well nourished, well developed, in no acute distress  ?HEENT: normal external ears and nose - normal external auditory canals and TMS -- Lips, Teeth and Gums - normal  ?Oropharynx - erythema noted ?Cardiac: RRR; no murmurs,  ?Respiratory:  faint scattered rhonchi noted ?Skin: warm and dry, no rash  ?Psych: euthymic mood, appropriate affect and demeanor ? ?BP 132/78 (BP Location: Left Arm, Patient Position: Sitting)   Pulse 82   Temp 98.2 ?F (36.8 ?C) (Oral)   Ht _1  (1.651 m)   Wt 164 lb (74.4 kg)   SpO2 99%   BMI 27.29 kg/m?  ?Wt Readings from Last 3 Encounters:  ?05/18/21 164 lb (74.4 kg)  ?05/06/21 165 lb (74.8 kg)  ?04/19/21 166 lb (75.3 kg)  ? ? ?There are no preventive care reminders to display for this patient. ? ?There are no preventive care reminders to display for this patient. ? ? ?Lab Results  ?Component Value Date  ? TSH 3.830 06/09/2020  ? ?Lab Results  ?Component Value Date  ? WBC 6.8 12/10/2020  ? HGB 13.2  12/10/2020  ? HCT 41.9 12/10/2020  ? MCV 77 (L) 12/10/2020  ? PLT 358 12/10/2020  ? ?Lab Results  ?Component Value Date  ? NA 139 12/10/2020  ? K 4.3 12/10/2020  ? CO2 25 12/10/2020  ? GLUCOSE 99 12/10/2020  ? BUN 15 12/10/2020  ? CREATININE 0.93 12/10/2020  ? BILITOT 0.4 12/10/2020  ? ALKPHOS 77 12/10/2020  ? AST 27 12/10/2020  ? ALT 24 12/10/2020  ?  PROT 7.2 12/10/2020  ? ALBUMIN 4.5 12/10/2020  ? CALCIUM 9.0 12/10/2020  ? ANIONGAP 7 03/31/2020  ? EGFR 65 12/10/2020  ? ?Lab Results  ?Component Value Date  ? CHOL 257 (H) 12/10/2020  ? ?Lab Results  ?Component Value Date  ? HDL 50 12/10/2020  ? ?Lab Results  ?Component Value Date  ? LDLCALC 170 (H) 12/10/2020  ? ?Lab Results  ?Component Value Date  ? TRIG 202 (H) 12/10/2020  ? ?Lab Results  ?Component Value Date  ? CHOLHDL 5.1 (H) 12/10/2020  ? ?No results found for: HGBA1C ? ?   ?Assessment & Plan:  ? ?Problem List Items Addressed This Visit   ? ?  ? Respiratory  ? Acute laryngopharyngitis - Primary  ? Relevant Medications  ? triamcinolone acetonide (KENALOG-40) injection 60 mg (Start on 05/18/2021  9:00 AM)  ? azithromycin (ZITHROMAX) 250 MG tablet  ? ?Meds ordered this encounter  ?Medications  ? triamcinolone acetonide (KENALOG-40) injection 60 mg  ? azithromycin (ZITHROMAX) 250 MG tablet  ?  Sig: Take 2 tablets on day 1, then 1 tablet daily on days 2 through 5  ?  Dispense:  6 tablet  ?  Refill:  0  ?  Order Specific Question:   Supervising Provider  ?  AnswerRochel Brome [[id number]]  ? ? ?No orders of the defined types were placed in this encounter. ?  ? ?Follow-up: Return if symptoms worsen or fail to improve. ? ?An After Visit Summary was printed and given to the patient. ? ?SARA R Zayli Villafuerte, PA-C ?[place] ?([phone] [id number]

## 2021-06-10 ENCOUNTER — Ambulatory Visit (INDEPENDENT_AMBULATORY_CARE_PROVIDER_SITE_OTHER): Payer: Medicare HMO | Admitting: Physician Assistant

## 2021-06-10 ENCOUNTER — Encounter: Payer: Self-pay | Admitting: Physician Assistant

## 2021-06-10 VITALS — BP 110/78 | HR 58 | Temp 97.4°F | Ht 65.0 in | Wt 163.0 lb

## 2021-06-10 DIAGNOSIS — E559 Vitamin D deficiency, unspecified: Secondary | ICD-10-CM

## 2021-06-10 DIAGNOSIS — I700 Atherosclerosis of aorta: Secondary | ICD-10-CM | POA: Diagnosis not present

## 2021-06-10 DIAGNOSIS — E782 Mixed hyperlipidemia: Secondary | ICD-10-CM

## 2021-06-10 NOTE — Progress Notes (Signed)
? ?Subjective:  ?Patient ID: [name], female    DOB: 11-02-1950  Age: 71 y.o. MRN: [id number] ? ?Chief Complaint  ?Patient presents with  ? Hyperlipidemia  ? ? ?Hyperlipidemia ? ? Pt with history of GERD and hiatal hernia- symptoms currently stable on omeprazole 20mg  qd - voices no concerns ? ?Pt with history of lung carcinoma - had a partial lobectomy 03/2020 - states symptoms are stable and is following with specialists as directed - sees Dr Roxan Hockey - last appt in February and follows ever 6 months - diagnosed with benign pericardial cyst ? ?Pt with history of hyperlipidemia and aortic atherosclerosis - currently on crestor 5mg  qd and due for labwork ? ? ?Current Outpatient Medications on File Prior to Visit  ?Medication Sig Dispense Refill  ? albuterol (VENTOLIN HFA) 108 (90 Base) MCG/ACT inhaler Inhale 2 puffs into the lungs every 6 (six) hours as needed for wheezing or shortness of breath. 8 g 2  ? Ascorbic Acid (VITAMIN C PO) Take 3 tablets by mouth daily.    ? cetirizine (ZYRTEC) 10 MG tablet Take 10 mg by mouth daily.    ? Coenzyme Q10 (CO Q-10 PO) Take 2 tablets by mouth daily.    ? Fluticasone-Umeclidin-Vilant (TRELEGY ELLIPTA) 100-62.5-25 MCG/ACT AEPB Inhale 1 puff into the lungs daily. 2 each 0  ? Misc Natural Products (CRAMP RELEAF PO) Take by mouth.    ? montelukast (SINGULAIR) 10 MG tablet TAKE 1 TABLET BY MOUTH EVERYDAY AT BEDTIME Strength: 10 mg 90 tablet 1  ? Multiple Vitamin (MULTIVITAMIN ADULT PO) Take 2 tablets by mouth daily.    ? Multiple Vitamins-Minerals (VITAMIN D3 COMPLETE PO) Take by mouth.    ? mupirocin ointment (BACTROBAN) 2 % Apply 1 application topically 2 (two) times daily. 22 g 2  ? omeprazole (PRILOSEC) 20 MG capsule TAKE 1 CAPSULE (20 MG TOTAL) BY MOUTH 2 (TWO) TIMES DAILY BEFORE A MEAL. 180 capsule 1  ? rosuvastatin (CRESTOR) 5 MG tablet Take 1 tablet (5 mg total) by mouth daily. 90 tablet 1  ? VITAMIN D PO Take 2 tablets by mouth daily.    ? ?No current  facility-administered medications on file prior to visit.  ? ?Past Medical History:  ?Diagnosis Date  ? Abnormal chest x-ray 01/19/2020  ? Acute laryngopharyngitis 03/18/2020  ? Aortic atherosclerosis ([place]) 01/28/2020  ? Arthritis   ? Cardiac murmur 03/01/2020  ? COPD (chronic obstructive pulmonary disease) ([place])   ? Coronary artery calcification seen on CT scan 03/01/2020  ? Cough 01/12/2020  ? COVID-19 03/10/2020  ? Dyspnea   ? on exertion,after getting covid  ? Family history of adverse reaction to anesthesia   ? brothr had n/v  ? GERD (gastroesophageal reflux disease)   ? Hiatal hernia   ? Hyperlipemia   ? Pneumonia   ? Pre-operative cardiovascular examination 03/01/2020  ? Right lower lobe lung mass 01/28/2020  ? 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 Visit for screening mammogram 01/28/2020  ? ?History reviewed. No pertinent surgical history.  ?Family History  ?Problem Relation Age of Onset  ? Heart disease Mother   ? Heart attack Mother   ? Cancer Father   ? Diabetes Father   ? Non-Hodgkin's lymphoma Brother   ? High Cholesterol Brother   ? High Cholesterol Brother   ? Heart disease Brother   ? Heart attack Brother   ? ?Social History  ? ?Socioeconomic History  ? Marital status: Widowed  ?  Spouse name: Not  on file  ? Number of children: Not on file  ? Years of education: Not on file  ? Highest education level: Not on file  ?Occupational History  ? Not on file  ?Tobacco Use  ? Smoking status: Former  ?  Years: 50.00  ?  Types: Cigarettes  ?  Quit date: 03/22/2017  ?  Years since quitting: 4.2  ? Smokeless tobacco: Never  ?Vaping Use  ? Vaping Use: Never used  ?Substance and Sexual Activity  ? Alcohol use: Not Currently  ? Drug use: Never  ? Sexual activity: Not Currently  ?Other Topics Concern  ? Not on file  ?Social History Narrative  ? Not on file  ? ?Social Determinants of Health  ? ?Financial Resource Strain: Not on file  ?Food Insecurity: Not on file  ?Transportation Needs: Not on file  ?Physical  Activity: Not on file  ?Stress: Not on file  ?Social Connections: Not on file  ? ?CONSTITUTIONAL: Negative for chills, fatigue, fever, unintentional weight gain and unintentional weight loss.  ?E/N/T: Negative for ear pain, nasal congestion and sore throat.  ?CARDIOVASCULAR: Negative for chest pain, dizziness, palpitations and pedal edema.  ?RESPIRATORY: Negative for recent cough and dyspnea.  ?GASTROINTESTINAL: Negative for abdominal pain, acid reflux symptoms, constipation, diarrhea, nausea and vomiting.  ?MSK: Negative for arthralgias and myalgias.  ?INTEGUMENTARY: Negative for rash.  ?NEUROLOGICAL: Negative for dizziness and headaches.  ?PSYCHIATRIC: Negative for sleep disturbance and to question depression screen.  Negative for depression, negative for anhedonia.  ?   ? ? ? ?Objective:  ?PHYSICAL EXAM:  ? ?VS: BP 110/78 (BP Location: Left Arm, Patient Position: Sitting)   Pulse (!) 58   Temp (!) 97.4 ?F (36.3 ?C) (Oral)   Ht 5\' 5"  (1.651 m)   Wt 163 lb (73.9 kg)   SpO2 97%   BMI 27.12 kg/m?  ? ?GEN: Well nourished, well developed, in no acute distress  ?HEENT: normal external ears and nose - normal external auditory canals and TMS - hearing grossly normal - normal nasal mucosa and septum - Lips, Teeth and Gums - normal  ?Oropharynx - normal mucosa, palate, and posterior pharynx ? ?Cardiac: RRR; no murmurs, ?Respiratory:  normal respiratory rate and pattern with no distress - normal breath sounds with no rales, rhonchi, wheezes or rubs ? ?Skin: warm and dry, no rash  ? ?Psych: euthymic mood, appropriate affect and demeanor ? ?Diabetic Foot Exam - Simple   ?No data filed ?  ?  ? ?Lab Results  ?Component Value Date  ? WBC 6.8 12/10/2020  ? HGB 13.2 12/10/2020  ? HCT 41.9 12/10/2020  ? PLT 358 12/10/2020  ? GLUCOSE 99 12/10/2020  ? CHOL 257 (H) 12/10/2020  ? TRIG 202 (H) 12/10/2020  ? HDL 50 12/10/2020  ? LDLCALC 170 (H) 12/10/2020  ? ALT 24 12/10/2020  ? AST 27 12/10/2020  ? NA 139 12/10/2020  ? K 4.3  12/10/2020  ? CL 99 12/10/2020  ? CREATININE 0.93 12/10/2020  ? BUN 15 12/10/2020  ? CO2 25 12/10/2020  ? TSH 3.830 06/09/2020  ? INR 1.0 03/25/2020  ? ? ? ? ?Assessment & Plan:  ? ?Problem List Items Addressed This Visit   ? ?  ? Cardiovascular and Mediastinum  ? Aortic atherosclerosis ([place]) ?Continue current meds  ?  ? Respiratory  ? Primary lung cancer, right middle lobe - resected ?Continue follow up with specialists  ?  ? Other  ? Hiatal hernia ?Continue omeprazole 20mg   ?  Hyperlipemia - Primary  ? Relevant Orders  ? CBC with Differential/Platelet  ? Comprehensive metabolic panel  ? Lipid panel ?Continue crestor and watch diet  ? ?Other Visit Diagnoses   ? ? Vitamin D deficiency      ? Relevant Orders  ? VITAMIN D 25 Hydroxy (Vit-D Deficiency, Fractures)  ?   ?   ?   ?   ?   ?   ? ?  ?. ? ?No orders of the defined types were placed in this encounter. ? ? ?Orders Placed This Encounter  ?Procedures  ? CBC with Differential/Platelet  ? Comprehensive metabolic panel  ? VITAMIN D 25 Hydroxy (Vit-D Deficiency, Fractures)  ? Lipid panel  ?  ? ?Follow-up: Return in about 6 months (around 12/10/2021) for chronic fasting follow up. ? ?An After Visit Summary was printed and given to the patient. ? ?SARA R Ja Pistole, PA-C ?[place] ?[phone] ?

## 2021-06-14 LAB — CBC WITH DIFFERENTIAL/PLATELET
Basophils Absolute: 0 10*3/uL (ref 0.0–0.2)
Basos: 1 %
EOS (ABSOLUTE): 0.2 10*3/uL (ref 0.0–0.4)
Eos: 3 %
Hematocrit: 42.9 % (ref 34.0–46.6)
Hemoglobin: 13.5 g/dL (ref 11.1–15.9)
Immature Grans (Abs): 0 10*3/uL (ref 0.0–0.1)
Immature Granulocytes: 0 %
Lymphocytes Absolute: 2.1 10*3/uL (ref 0.7–3.1)
Lymphs: 31 %
MCH: 25.1 pg — ABNORMAL LOW (ref 26.6–33.0)
MCHC: 31.5 g/dL (ref 31.5–35.7)
MCV: 80 fL (ref 79–97)
Monocytes Absolute: 0.5 10*3/uL (ref 0.1–0.9)
Monocytes: 8 %
Neutrophils Absolute: 3.9 10*3/uL (ref 1.4–7.0)
Neutrophils: 57 %
Platelets: 336 10*3/uL (ref 150–450)
RBC: 5.38 x10E6/uL — ABNORMAL HIGH (ref 3.77–5.28)
RDW: 15.7 % — ABNORMAL HIGH (ref 11.7–15.4)
WBC: 6.7 10*3/uL (ref 3.4–10.8)

## 2021-06-14 LAB — COMPREHENSIVE METABOLIC PANEL
ALT: 38 IU/L — ABNORMAL HIGH (ref 0–32)
AST: 34 IU/L (ref 0–40)
Albumin/Globulin Ratio: 1.9 (ref 1.2–2.2)
Albumin: 4.8 g/dL — ABNORMAL HIGH (ref 3.7–4.7)
Alkaline Phosphatase: 72 IU/L (ref 44–121)
BUN/Creatinine Ratio: 18 (ref 12–28)
BUN: 18 mg/dL (ref 8–27)
Bilirubin Total: 0.3 mg/dL (ref 0.0–1.2)
CO2: 19 mmol/L — ABNORMAL LOW (ref 20–29)
Calcium: 9.7 mg/dL (ref 8.7–10.3)
Chloride: 105 mmol/L (ref 96–106)
Creatinine, Ser: 1.01 mg/dL — ABNORMAL HIGH (ref 0.57–1.00)
Globulin, Total: 2.5 g/dL (ref 1.5–4.5)
Glucose: 90 mg/dL (ref 70–99)
Potassium: 4.6 mmol/L (ref 3.5–5.2)
Sodium: 147 mmol/L — ABNORMAL HIGH (ref 134–144)
Total Protein: 7.3 g/dL (ref 6.0–8.5)
eGFR: 59 mL/min/{1.73_m2} — ABNORMAL LOW (ref >59)

## 2021-06-14 LAB — LIPID PANEL
Chol/HDL Ratio: 3.4 ratio (ref 0.0–4.4)
Cholesterol, Total: 206 mg/dL — ABNORMAL HIGH (ref 100–199)
HDL: 60 mg/dL (ref >39)
LDL Chol Calc (NIH): 121 mg/dL — ABNORMAL HIGH (ref 0–99)
Triglycerides: 140 mg/dL (ref 0–149)
VLDL Cholesterol Cal: 25 mg/dL (ref 5–40)

## 2021-06-14 LAB — CARDIOVASCULAR RISK ASSESSMENT

## 2021-06-14 LAB — VITAMIN D 25 HYDROXY (VIT D DEFICIENCY, FRACTURES): Vit D, 25-Hydroxy: 59.5 ng/mL (ref 30.0–100.0)

## 2021-07-15 DIAGNOSIS — Z4689 Encounter for fitting and adjustment of other specified devices: Secondary | ICD-10-CM | POA: Diagnosis not present

## 2021-07-19 ENCOUNTER — Encounter: Payer: Medicare HMO | Admitting: Physician Assistant

## 2021-08-18 ENCOUNTER — Ambulatory Visit (INDEPENDENT_AMBULATORY_CARE_PROVIDER_SITE_OTHER): Payer: Medicare HMO | Admitting: Physician Assistant

## 2021-08-18 ENCOUNTER — Encounter: Payer: Self-pay | Admitting: Physician Assistant

## 2021-08-18 VITALS — BP 128/64 | HR 58 | Temp 97.8°F | Ht 65.0 in | Wt 163.0 lb

## 2021-08-18 DIAGNOSIS — Z0000 Encounter for general adult medical examination without abnormal findings: Secondary | ICD-10-CM

## 2021-08-18 NOTE — Progress Notes (Signed)
Subjective:   [name] is a 70 y.o. female who presents for Medicare Annual (Subsequent) preventive examination.  Review of Systems    Review of Systems  Constitutional:  Negative for fever and weight loss.  HENT:  Negative for congestion, ear pain and sore throat.   Respiratory:  Negative for cough and shortness of breath.   Cardiovascular:  Negative for chest pain and leg swelling.  Gastrointestinal:  Negative for abdominal pain.  Genitourinary:  Negative for frequency.  Musculoskeletal:  Negative for falls and myalgias.  Neurological:  Negative for dizziness, weakness and headaches.  Psychiatric/Behavioral:  Negative for depression and memory loss.           Objective:  PHYSICAL EXAM:   VS: BP 128/64 (BP Location: Left Arm, Patient Position: Sitting)   Pulse (!) 58   Temp 97.8 F (36.6 C) (Temporal)   Ht 5\' 5"  (1.651 m)   Wt 163 lb (73.9 kg)   SpO2 96%   BMI 27.12 kg/m   GEN: Well nourished, well developed, in no acute distress   Cardiac: RRR; no murmurs, rubs, or gallops,no edema -  Respiratory:  normal respiratory rate and pattern with no distress - normal breath sounds with no rales, rhonchi, wheezes or rubs  Psych: euthymic mood, appropriate affect and demeanor      08/18/2021   11:55 AM 06/09/2020    9:59 AM 03/29/2020    6:00 PM 03/25/2020    1:07 PM  Advanced Directives  Does Patient Have a Medical Advance Directive? No No No No  Would patient like information on creating a medical advance directive? No - Patient declined No - Patient declined No - Patient declined Yes (MAU/Ambulatory/Procedural Areas - Information given)    Current Medications (verified) Outpatient Encounter Medications as of 08/18/2021  Medication Sig   albuterol (VENTOLIN HFA) 108 (90 Base) MCG/ACT inhaler Inhale 2 puffs into the lungs every 6 (six) hours as needed for wheezing or shortness of breath.   Ascorbic Acid (VITAMIN C PO) Take 3 tablets by mouth daily.   cetirizine  (ZYRTEC) 10 MG tablet Take 10 mg by mouth daily.   Coenzyme Q10 (CO Q-10 PO) Take 2 tablets by mouth daily.   Fluticasone-Umeclidin-Vilant (TRELEGY ELLIPTA) 100-62.5-25 MCG/ACT AEPB Inhale 1 puff into the lungs daily.   Misc Natural Products (CRAMP RELEAF PO) Take by mouth.   montelukast (SINGULAIR) 10 MG tablet TAKE 1 TABLET BY MOUTH EVERYDAY AT BEDTIME Strength: 10 mg   Multiple Vitamin (MULTIVITAMIN ADULT PO) Take 2 tablets by mouth daily.   Multiple Vitamins-Minerals (VITAMIN D3 COMPLETE PO) Take by mouth.   mupirocin ointment (BACTROBAN) 2 % Apply 1 application topically 2 (two) times daily.   omeprazole (PRILOSEC) 20 MG capsule TAKE 1 CAPSULE (20 MG TOTAL) BY MOUTH 2 (TWO) TIMES DAILY BEFORE A MEAL.   rosuvastatin (CRESTOR) 5 MG tablet Take 1 tablet (5 mg total) by mouth daily.   VITAMIN D PO Take 2 tablets by mouth daily.   No facility-administered encounter medications on file as of 08/18/2021.    Allergies (verified) Codeine and Surgical lubricant   History: Past Medical History:  Diagnosis Date   Abnormal chest x-ray 01/19/2020   Acute laryngopharyngitis 03/18/2020   Aortic atherosclerosis ([place]) 01/28/2020   Arthritis    Cardiac murmur 03/01/2020   COPD (chronic obstructive pulmonary disease) ([facility])    Coronary artery calcification seen on CT scan 03/01/2020   Cough 01/12/2020   COVID-19 03/10/2020   Dyspnea    on  exertion,after getting covid   Family history of adverse reaction to anesthesia    brothr had n/v   GERD (gastroesophageal reflux disease)    Hiatal hernia    Hyperlipemia    Pneumonia    Pre-operative cardiovascular examination 03/01/2020   Right lower lobe lung mass 01/28/2020   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 No pertinent surgical history. Family History  Problem Relation Age of Onset   Heart disease Mother    Heart attack Mother    Cancer Father    Diabetes Father     Non-Hodgkin's lymphoma Brother    High Cholesterol Brother    High Cholesterol Brother    Heart disease Brother    Heart attack Brother    Social History   Socioeconomic History   Marital status: Widowed    Spouse name: Not on file   Number of children: Not on file   Years of education: Not on file   Highest education level: Not on file  Occupational History   Not on file  Tobacco Use   Smoking status: Former    Years: 50.00    Types: Cigarettes    Quit date: 03/22/2017    Years since quitting: 4.4   Smokeless tobacco: Never  Vaping Use   Vaping Use: Never used  Substance and Sexual Activity   Alcohol use: Not Currently   Drug use: Never   Sexual activity: Not Currently  Other Topics Concern   Not on file  Social History Narrative   Not on file   Social Determinants of Health   Financial Resource Strain: Low Risk  (08/18/2021)   Overall Financial Resource Strain (CARDIA)    Difficulty of Paying Living Expenses: Not hard at all  Food Insecurity: No [place] (08/18/2021)   Hunger Vital Sign    Worried About Running Out of Food in the Last Year: Never true    [place] in the Last Year: Never true  Transportation Needs: No Transportation Needs (08/18/2021)   PRAPARE - Hydrologist (Medical): No    Lack of Transportation (Non-Medical): No  Physical Activity: Inactive (08/18/2021)   Exercise Vital Sign    Days of Exercise per Week: 0 days    Minutes of Exercise per Session: 0 min  Stress: No Stress Concern Present (08/18/2021)   [place]    Feeling of Stress : Not at all  Social Connections: Moderately Isolated (08/18/2021)   Social Connection and Isolation Panel [NHANES]    Frequency of Communication with Friends and Family: More than three times a week    Frequency of Social Gatherings with Friends and Family: More than three times a week    Attends Religious  Services: More than 4 times per year    Active Member of [employer] or Organizations: No    Attends Archivist Meetings: Never    Marital Status: Widowed    Tobacco Counseling Counseling given: Not Answered   Clinical Intake:                          Activities of Daily Living    08/18/2021   11:29 AM  In your present state of health, do you have any difficulty performing the following activities:  Hearing? 0  Vision? 0  Difficulty concentrating or making decisions? 0  Walking or climbing stairs? 0  Dressing or bathing? 0  Doing errands, shopping? 0    Patient Care Team: Marge Duncans, Hershal Coria as PCP - General (Physician Assistant)  Indicate any recent Medical Services you may have received from other than [facility] providers in the past year (date may be approximate).     Assessment:   This is a routine wellness examination for [place].  Hearing/Vision screen Vision Screening   Right eye Left eye Both eyes  Without correction     With correction 20/20 20/20 20/20     Dietary issues and exercise activities discussed:     Goals Addressed   None   Depression Screen    08/18/2021   11:33 AM 06/09/2020    9:59 AM  PHQ 2/9 Scores  PHQ - 2 Score 0 0    Fall Risk    08/18/2021   11:53 AM 12/10/2020    8:18 AM 06/09/2020    9:59 AM 01/10/2019   11:18 AM 09/11/2016    9:51 AM  Fall Risk   Falls in the past year? 0 0 0 1 No  Comment    [employer] Telephone Survey: data to providers prior to load [employer] Survey: data to providers prior to load  Number falls in past yr: 0 0 0 1   Comment    Emmi Telephone Survey Actual Response = 1   Injury with Fall? 0 0 0 1   Risk for fall due to : No Fall Risks No Fall Risks No Fall Risks    Follow up Falls evaluation completed Falls evaluation completed Falls evaluation completed      [place]:  Any stairs in or around the home? No  If so, are there any without handrails? No   Home free of loose throw rugs in walkways, pet beds, electrical cords, etc? Yes  Adequate lighting in your home to reduce risk of falls? Yes   ASSISTIVE DEVICES UTILIZED TO PREVENT FALLS:  Life alert? No  Use of a cane, walker or w/c? No  Grab bars in the bathroom? Yes  Shower chair or bench in shower? No  Elevated toilet seat or a handicapped toilet? No   TIMED UP AND GO:  Was the test performed? Yes .  Length of time to ambulate 10 feet: 5 sec.   Gait steady and fast without use of assistive device  Cognitive Function:        Immunizations Immunization History  Administered Date(s) Administered   Fluad Quad(high Dose 65+) 12/02/2019, 12/10/2020   Influenza-Unspecified 11/20/2017   PFIZER Comirnaty(Gray Top)Covid-19 Tri-Sucrose Vaccine 02/26/2020   Pneumococcal Conjugate-13 02/18/2018   Tdap 05/09/2017    TDAP status: Up to date  Flu Vaccine status: Up to date  Pneumococcal vaccine status: Due, Education has been provided regarding the importance of this vaccine. Advised may receive this vaccine at local pharmacy or Health Dept. Aware to provide a copy of the vaccination record if obtained from local pharmacy or Health Dept. Verbalized acceptance and understanding.  Covid-19 vaccine status: Declined, Education has been provided regarding the importance of this vaccine but patient still declined. Advised may receive this vaccine at local pharmacy or Health Dept.or vaccine clinic. Aware to provide a copy of the vaccination record if obtained from local pharmacy or Health Dept. Verbalized acceptance and understanding.  Qualifies for Shingles Vaccine?  Does not want.   Zostavax completed No   Shingrix Completed?: No.    Education has been  provided regarding the importance of this vaccine. Patient has been advised to call insurance company to determine out of pocket expense if they have not yet received this vaccine. Advised may also receive vaccine at local pharmacy or  Health Dept. Verbalized acceptance and understanding.  Screening Tests Health Maintenance  Topic Date Due   DEXA SCAN  06/11/2022 (Originally 11/12/2013)   INFLUENZA VACCINE  09/20/2021   MAMMOGRAM  01/12/2023   COLONOSCOPY (Pts 45-52yrs Insurance coverage will need to be confirmed)  01/19/2027   TETANUS/TDAP  05/10/2027   HPV VACCINES  Aged Out   Pneumonia Vaccine 60+ Years old  Discontinued   COVID-19 Vaccine  Discontinued   Hepatitis C Screening  Discontinued   Zoster Vaccines- Shingrix  Discontinued    Health Maintenance  There are no preventive care reminders to display for this patient.  Colorectal cancer screening: Type of screening: Colonoscopy. Completed 01/18/2017. Repeat every 10  years if needed.  Mammogram status: Completed 01/11/2021. Repeat every year  Bone density - pt defers  Lung Cancer Screening: (Low Dose CT Chest recommended if Age 32-80 years, 30 pack-year currently smoking OR have quit w/in 15years.) does not qualify.     Additional Screening:  Hepatitis C Screening: pt defers  Vision Screening: Recommended annual ophthalmology exams for early detection of glaucoma and other disorders of the eye. Is the patient up to date with their annual eye exam?  Yes  Who is the provider or what is the name of the office in which the patient attends annual eye exams? Pt is due If pt is not established with a provider, would they like to be referred to a provider to establish care? No .   Dental Screening: Recommended annual dental exams for proper oral hygiene      Plan:     I have personally reviewed and noted the following in the patient's chart:   Medical and social history Use of alcohol, tobacco or illicit drugs  Current medications and supplements including opioid prescriptions.  Functional ability and status Nutritional status Physical activity Advanced directives List of other physicians Hospitalizations, surgeries, and ER visits in previous  12 months Vitals Screenings to include cognitive, depression, and falls Referrals and appointments  In addition, I have reviewed and discussed with patient certain preventive protocols, quality metrics, and best practice recommendations. A written personalized care plan for preventive services as well as general preventive health recommendations were provided to patient.       I,Lauren M Auman,acting as a Education administrator for [employer], PA-C.,have documented all relevant documentation on the behalf of [place], PA-C,as directed by  SARA R Hridhaan Yohn, PA-C while in the presence of SARA R Joshwa Hemric, PA-C.   Oleta Mouse, CMA   08/18/2021

## 2021-08-19 ENCOUNTER — Other Ambulatory Visit: Payer: Self-pay | Admitting: Thoracic Surgery (Cardiothoracic Vascular Surgery)

## 2021-08-19 DIAGNOSIS — C3491 Malignant neoplasm of unspecified part of right bronchus or lung: Secondary | ICD-10-CM

## 2021-08-26 IMAGING — DX DG CHEST 1V PORT
1 series · 1 of 1 positions shown · non-contrast
Comparison: 03/30/2018

CLINICAL DATA: Follow-up chest tube

EXAM:
PORTABLE CHEST 1 VIEW

[chest]
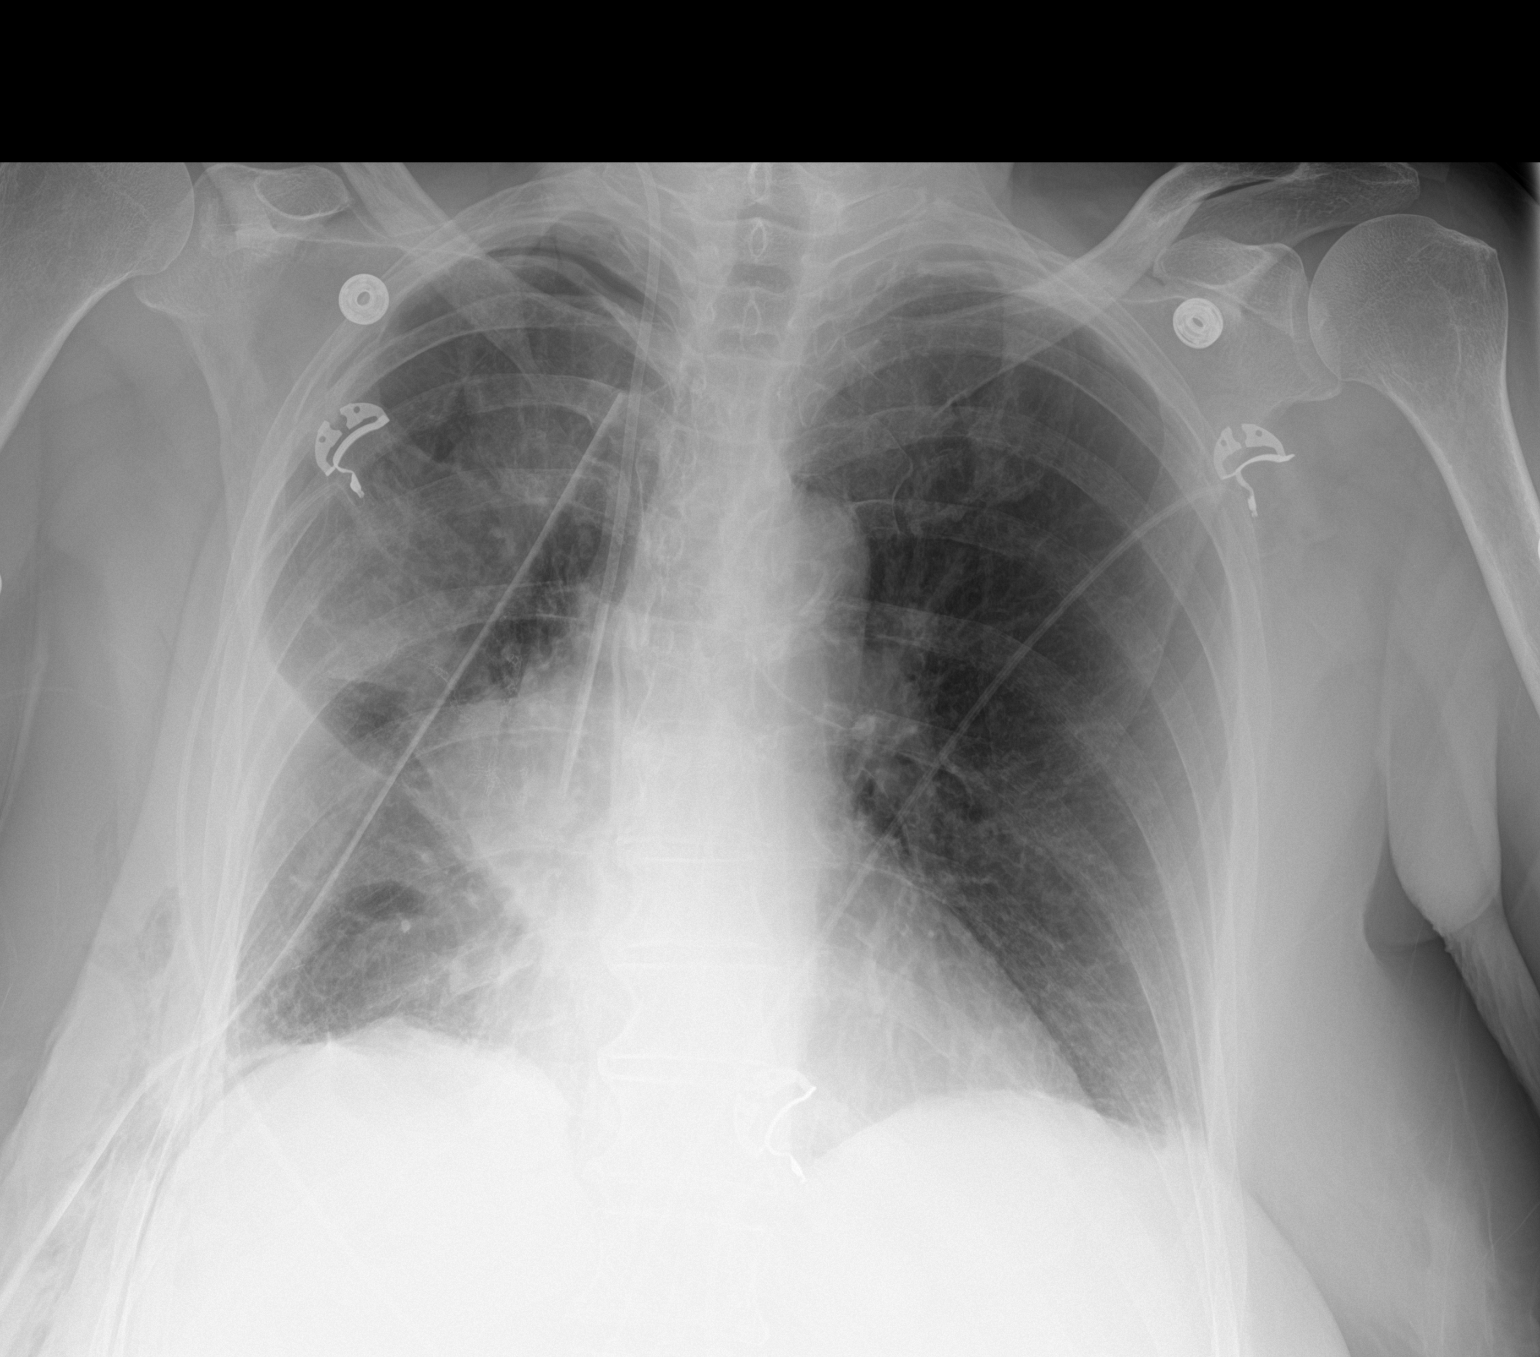

[1 of 1 positions shown; findings below may reference images not displayed]

FINDINGS: Cardiac shadow is stable. Right jugular central line is again seen
and stable. Right chest tube is noted in satisfactory position. Tiny
apical pneumothorax is noted on the right. Postsurgical changes are
again seen in the right hilum with mild volume loss on the right.
Left lung remains clear.
IMPRESSION: Stable appearance of the chest with small apical pneumothorax on the
right.

## 2021-08-27 IMAGING — DX DG CHEST 1V PORT
1 series · 1 of 1 positions shown · non-contrast
Comparison: 03/31/2020

CLINICAL DATA: Sore chest post chest tube removal

EXAM:
PORTABLE CHEST 1 VIEW

[chest ap]
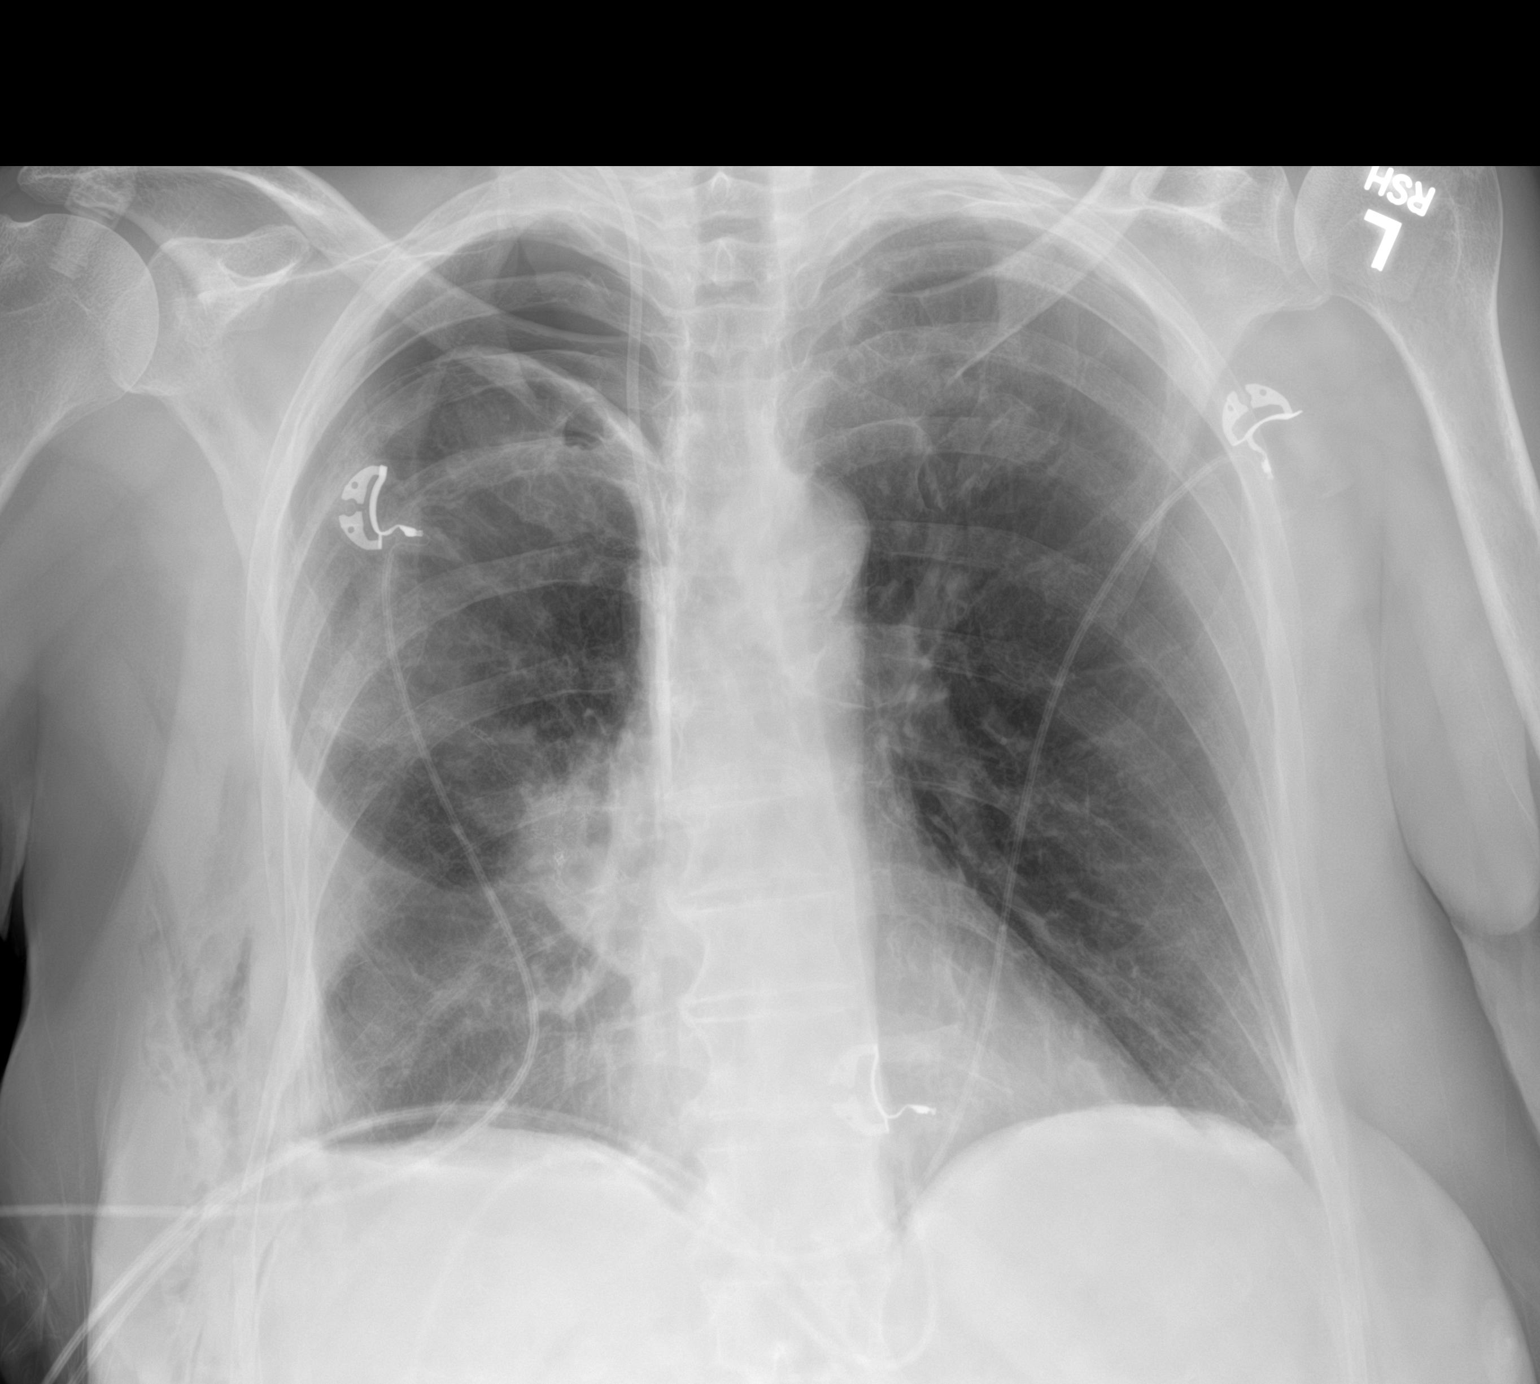

[1 of 1 positions shown; findings below may reference images not displayed]

FINDINGS: Right chest tube is no longer present. Right IJ central line tip is
unchanged.

Increased size of small right apical pneumothorax. Postoperative
changes of the right lung with improved aeration. Possible small
right pleural effusion. Similar right chest wall emphysema. Stable
cardiomediastinal contours.
IMPRESSION: Increased size of small right apical pneumothorax post chest tube
removal. Improved aeration of the right lung.

## 2021-08-28 IMAGING — DX DG CHEST 2V
2 series · 2 of 2 positions shown · non-contrast
Comparison: 04/01/2020

CLINICAL DATA: Postop check

EXAM:
CHEST - 2 VIEW

[chest pa]
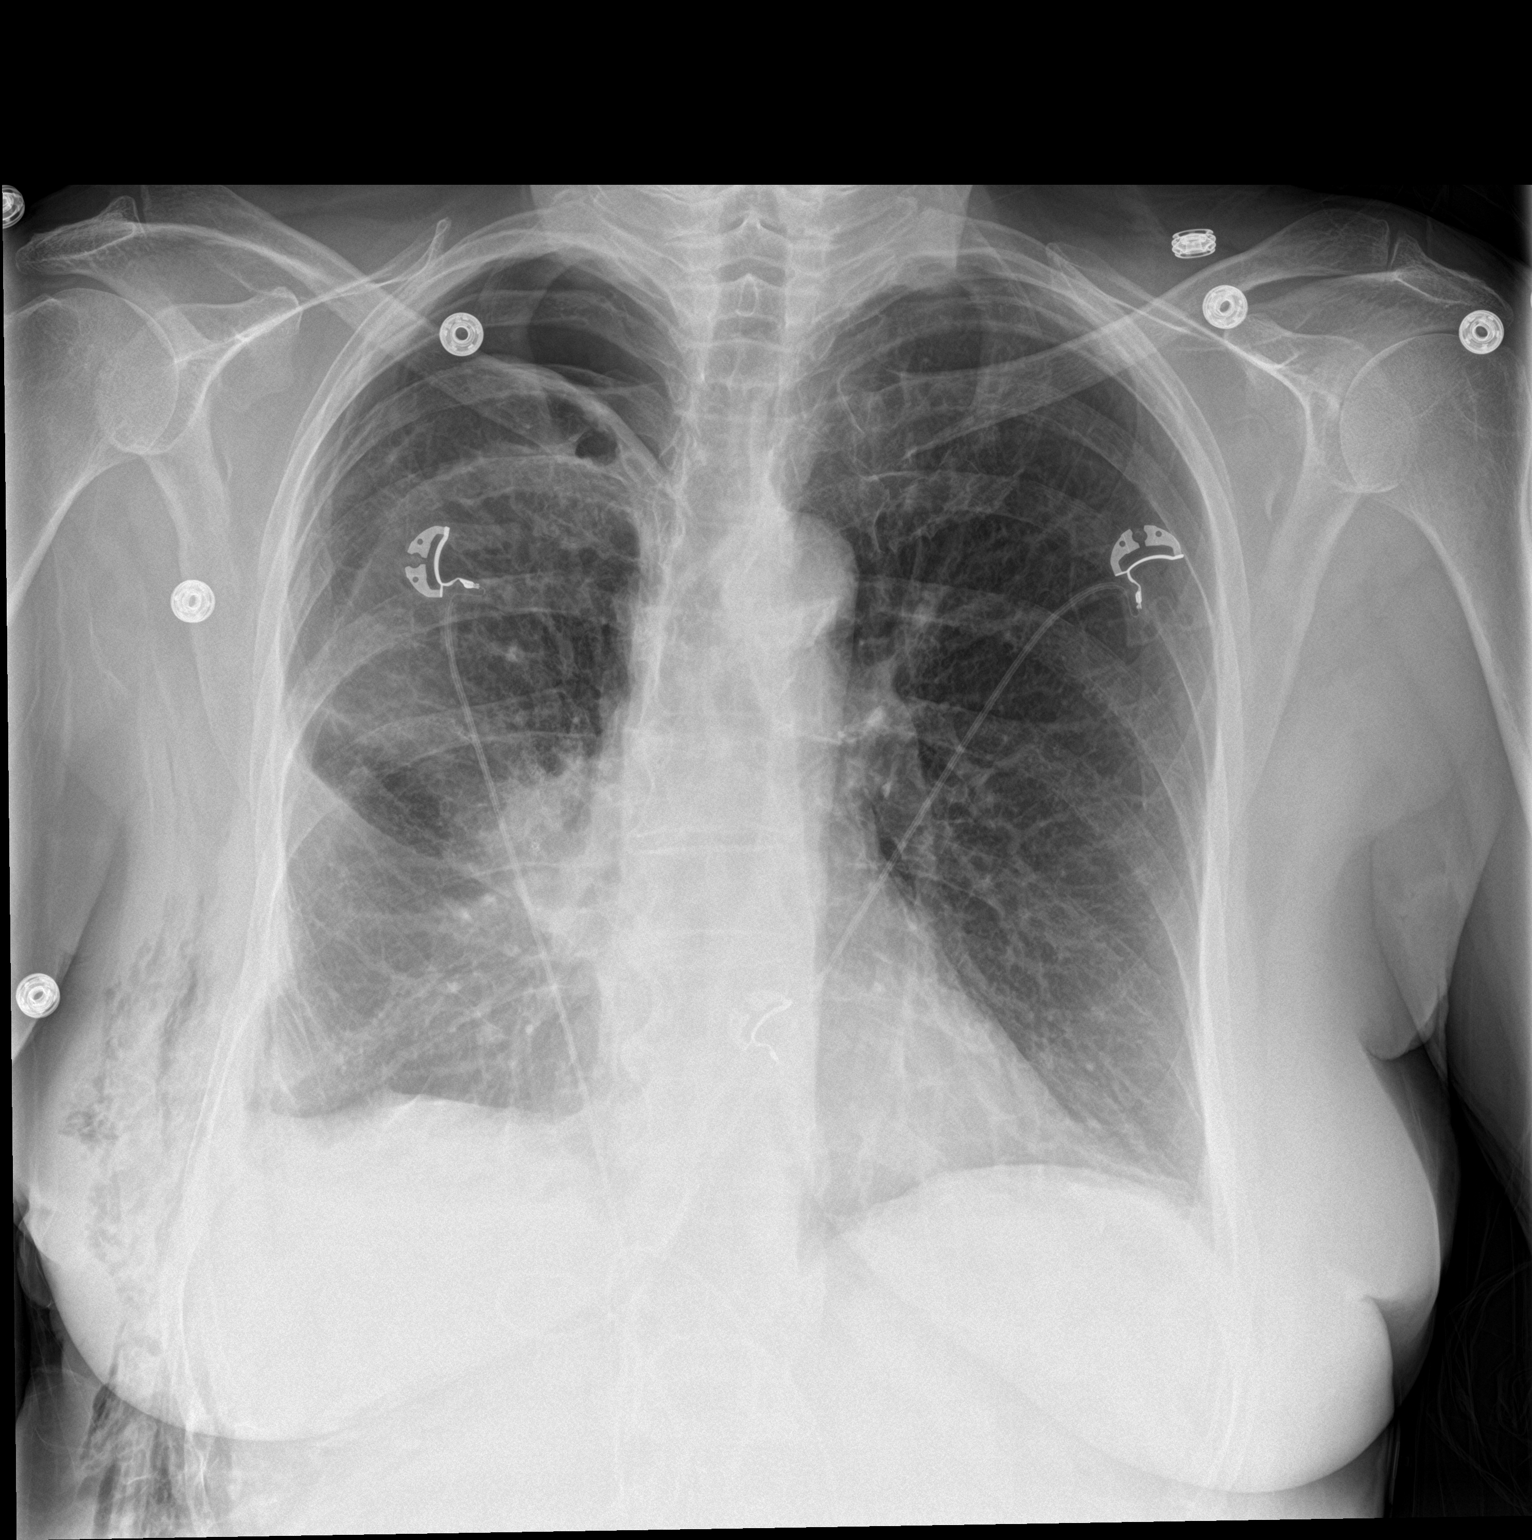

[chest lat]
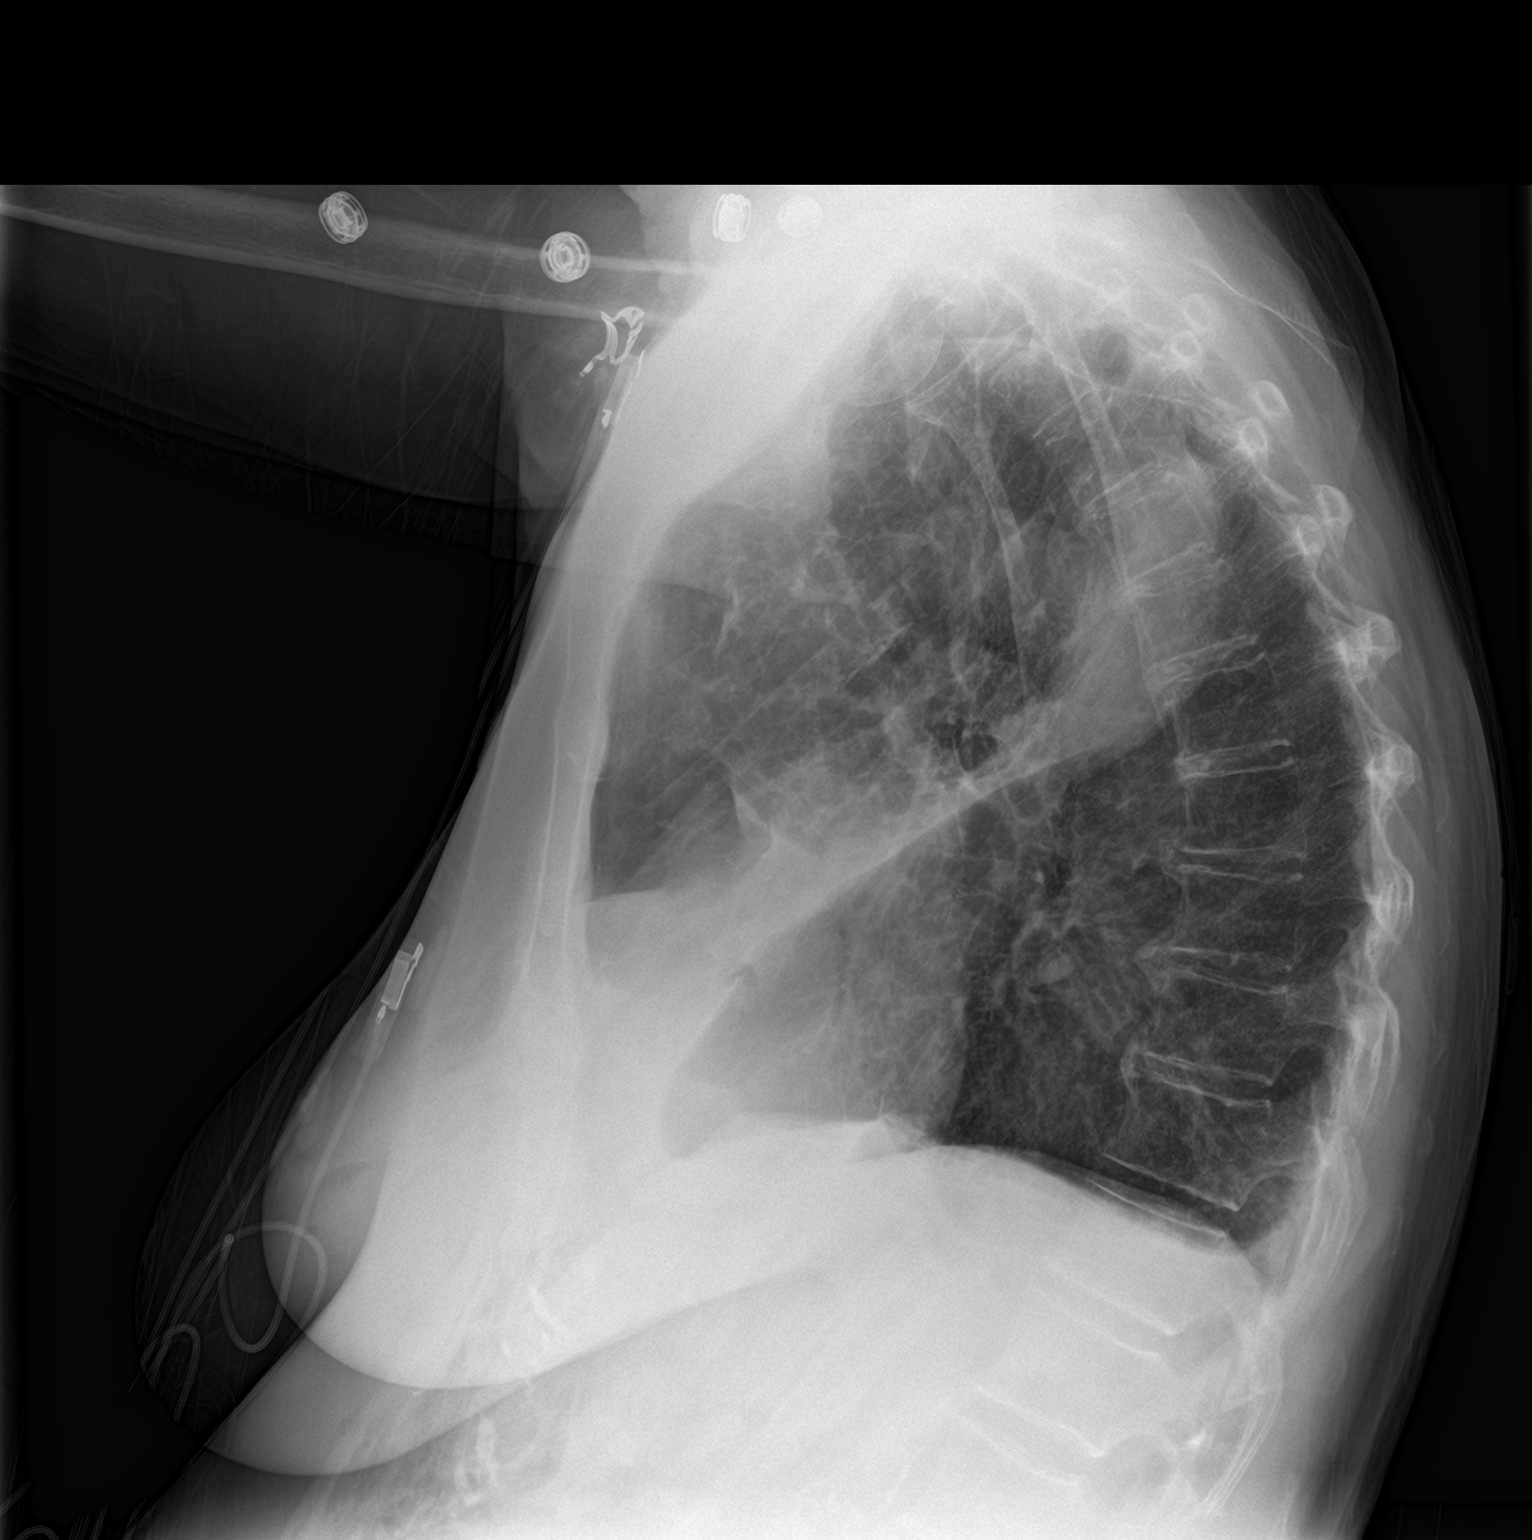

[2 of 2 positions shown; findings below may reference images not displayed]

FINDINGS: Right jugular central venous catheter tip removed. Right apical
pneumothorax unchanged approximately 3.5 cm in thickness. Small
right effusion unchanged. Mild right lower lobe atelectasis
unchanged. Increased subcutaneous gas in the right lateral chest
wall.

Left lung remains clear.
IMPRESSION: Right apical pneumothorax unchanged. Increased subcutaneous gas on
the right.

## 2021-09-09 IMAGING — DX DG CHEST 2V
2 series · 2 of 2 positions shown · non-contrast
Comparison: 04/01/2020, CT 01/23/2000, radiograph 03/31/2020,
03/29/2020

CLINICAL DATA: Lung cancer

EXAM:
CHEST - 2 VIEW

[dg chest 2 view (1 of 2)]
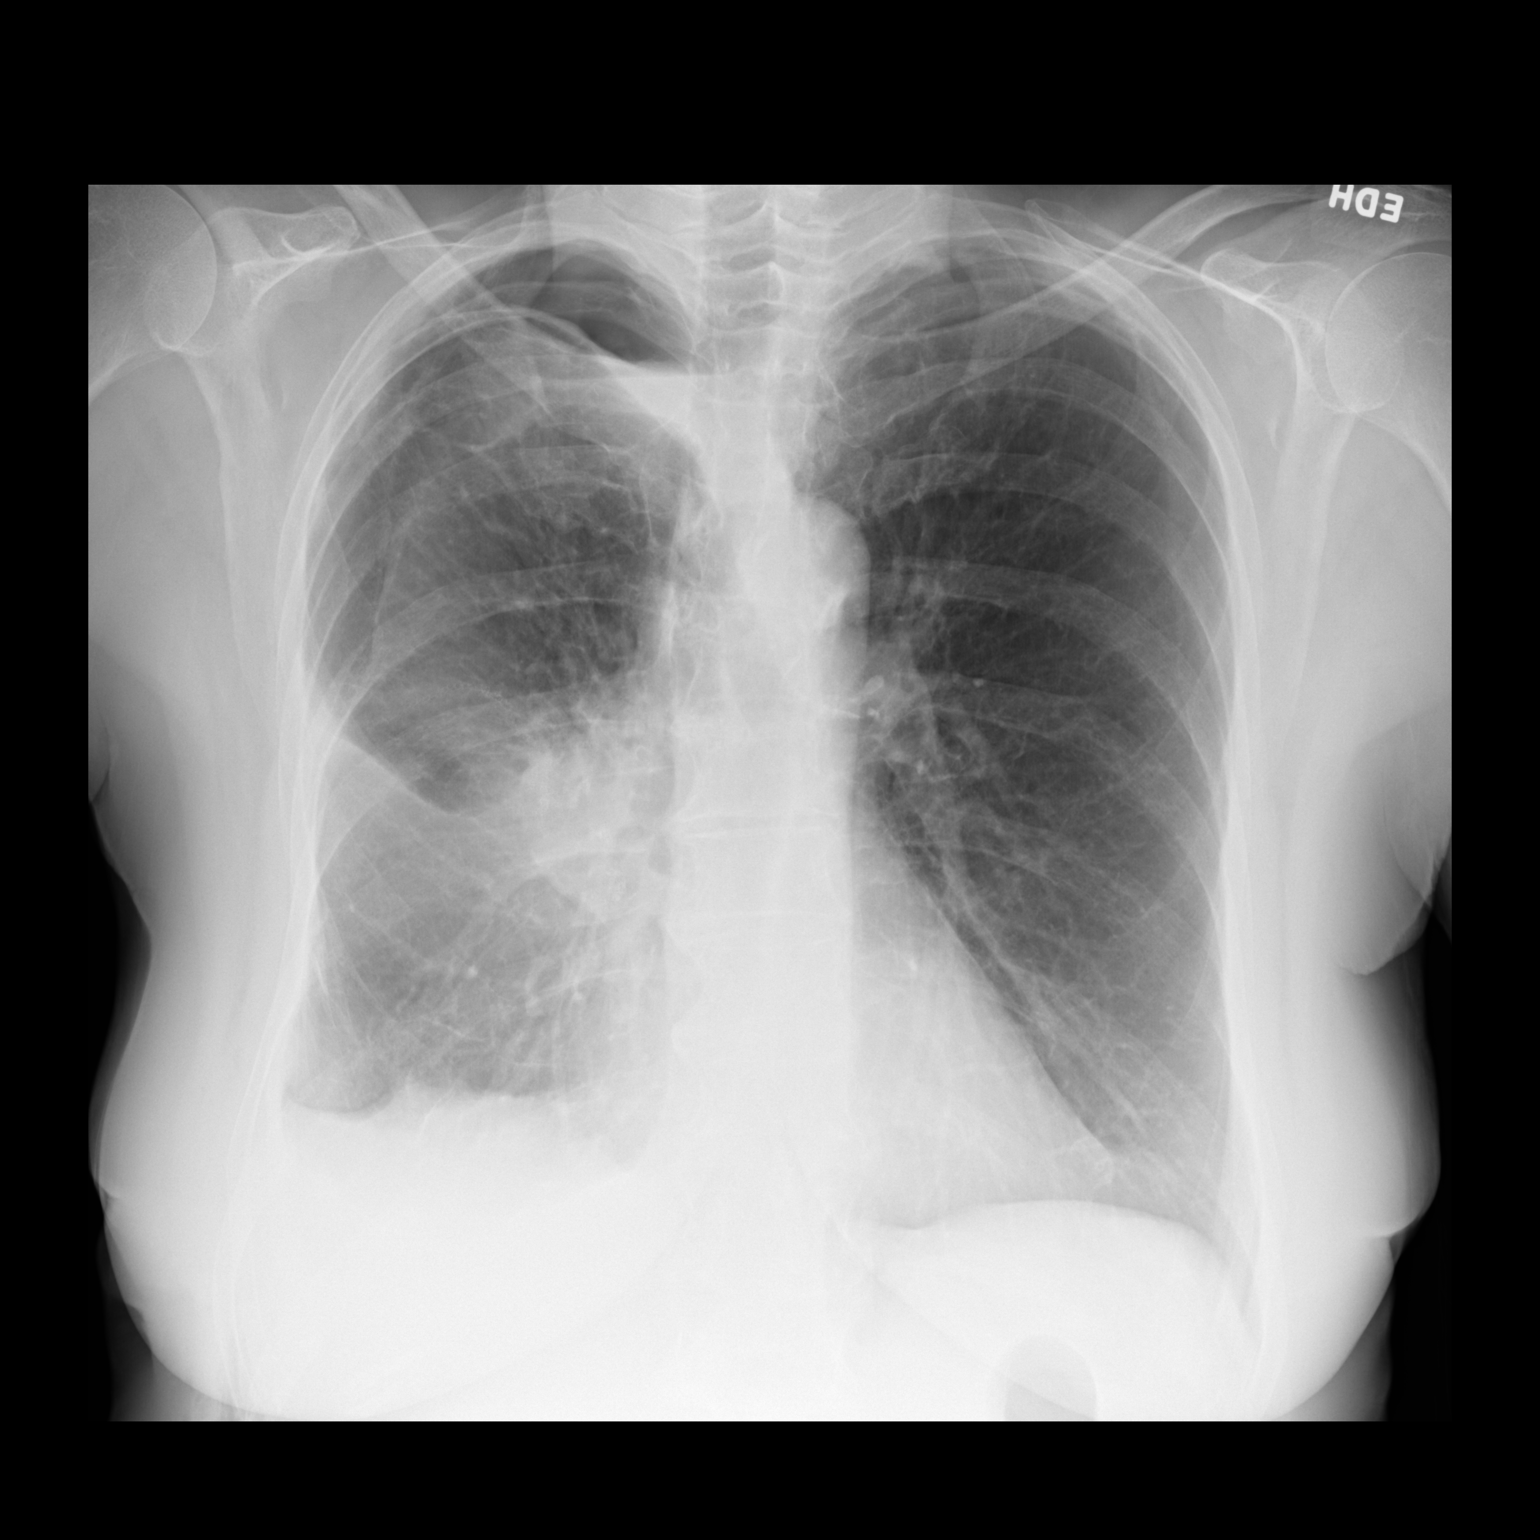

[dg chest 2 view (2 of 2)]
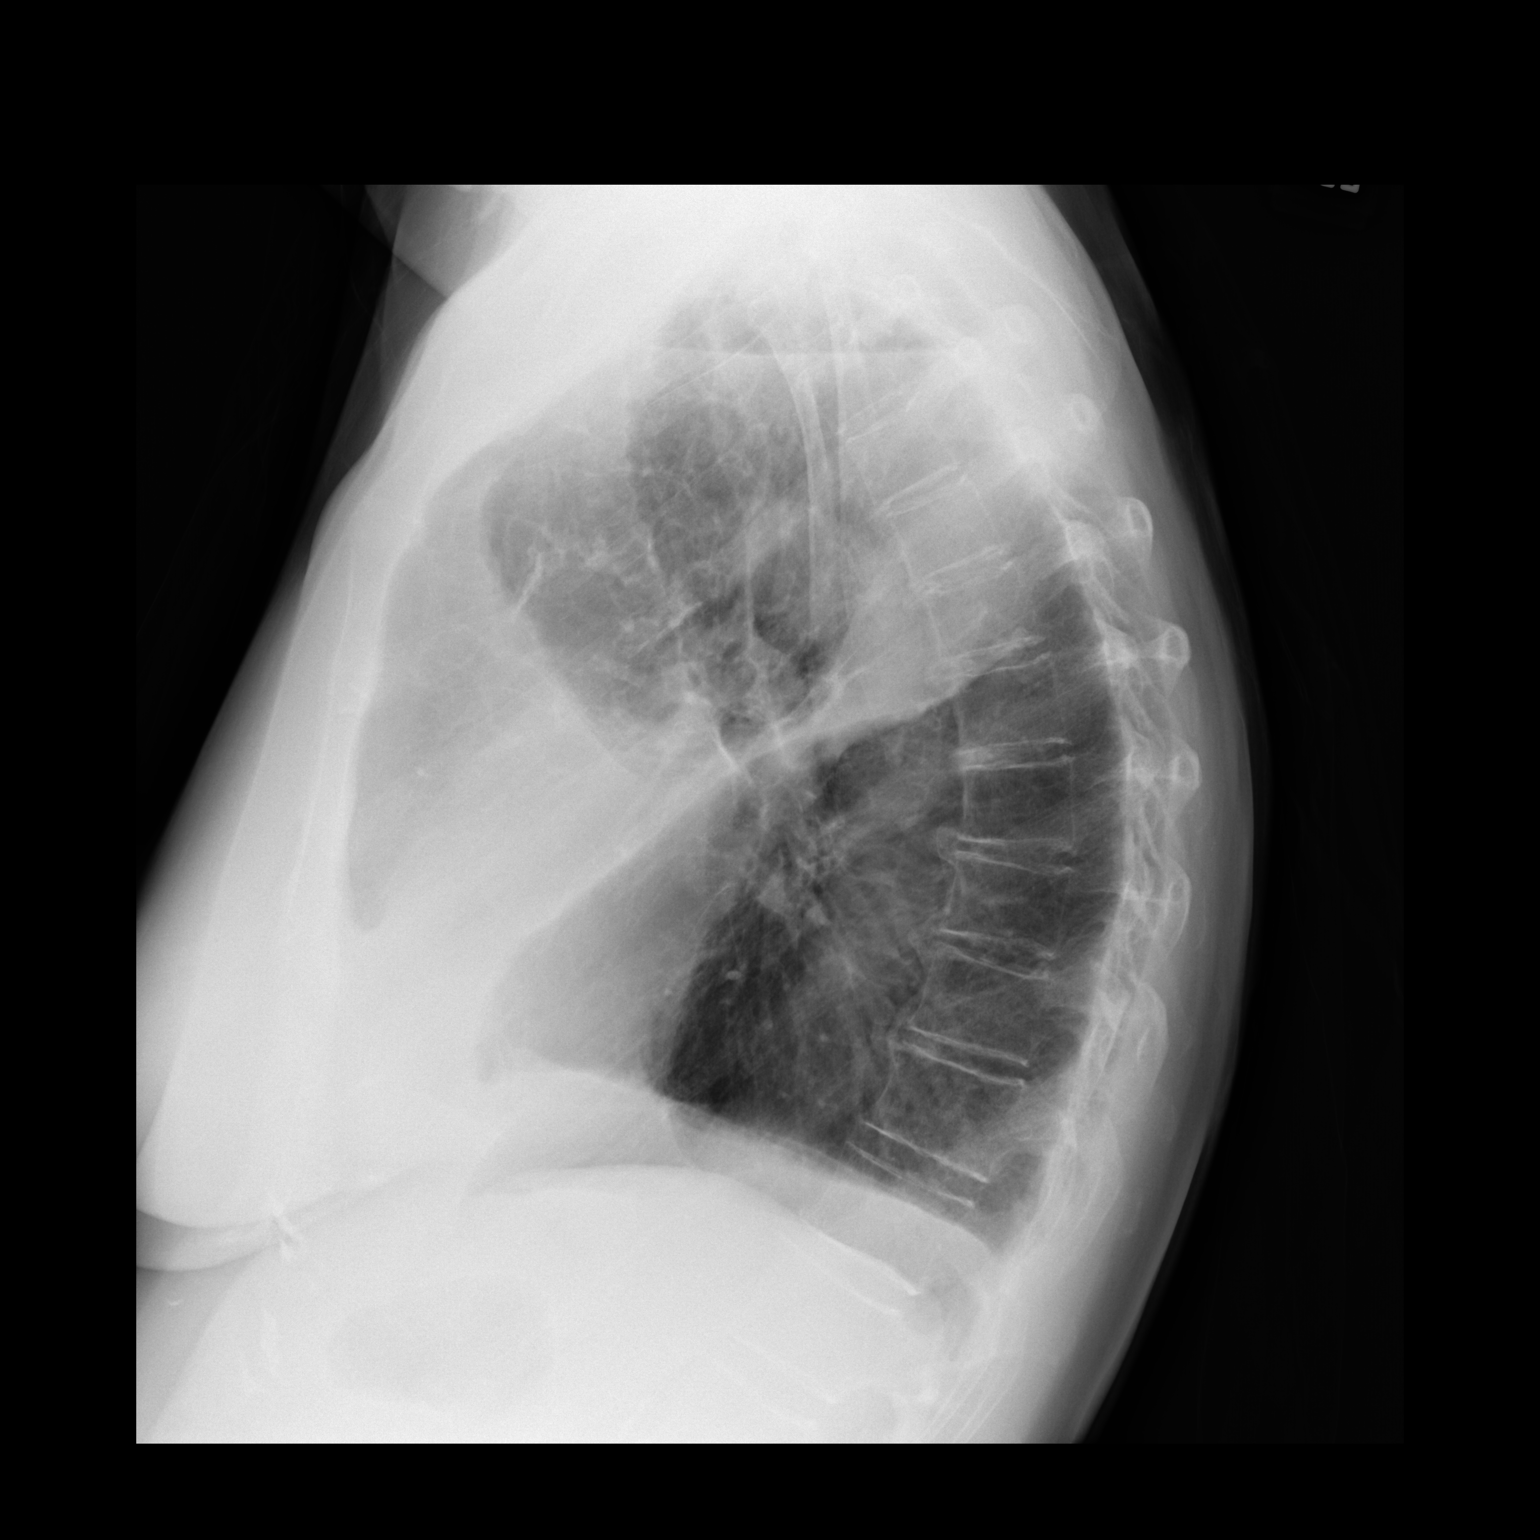

[2 of 2 positions shown; findings below may reference images not displayed]

FINDINGS: Left lung is grossly clear. Decreased right chest wall emphysema.
Residual right apical pneumothorax, decreased compared to prior
radiograph, demonstrating 2.4 cm pleural-parenchymal separation at
the apex compared to 3.5 cm previously. Residual right pleural
effusion with fluid along the fissure on lateral view. Mild airspace
disease at the right base without change. Stable cardiomediastinal
silhouette.
IMPRESSION: Residual small right apical pneumothorax, decreased compared to
prior radiograph. Residual right pleural effusion and right basilar
airspace disease without significant change. Decreased right chest
wall emphysema.

## 2021-09-14 DIAGNOSIS — Z4689 Encounter for fitting and adjustment of other specified devices: Secondary | ICD-10-CM | POA: Diagnosis not present

## 2021-10-10 DIAGNOSIS — Z961 Presence of intraocular lens: Secondary | ICD-10-CM | POA: Diagnosis not present

## 2021-10-10 DIAGNOSIS — H26493 Other secondary cataract, bilateral: Secondary | ICD-10-CM | POA: Diagnosis not present

## 2021-10-10 DIAGNOSIS — H18053 Posterior corneal pigmentations, bilateral: Secondary | ICD-10-CM | POA: Diagnosis not present

## 2021-10-14 DIAGNOSIS — H26493 Other secondary cataract, bilateral: Secondary | ICD-10-CM | POA: Diagnosis not present

## 2021-10-25 ENCOUNTER — Ambulatory Visit
Admission: RE | Admit: 2021-10-25 | Discharge: 2021-10-25 | Disposition: A | Discharge status: Home / Self Care | Payer: Medicare HMO | Source: Ambulatory Visit | Attending: Thoracic Surgery (Cardiothoracic Vascular Surgery) | Admitting: Thoracic Surgery (Cardiothoracic Vascular Surgery)

## 2021-10-25 ENCOUNTER — Encounter: Payer: Self-pay | Admitting: Thoracic Surgery (Cardiothoracic Vascular Surgery)

## 2021-10-25 ENCOUNTER — Ambulatory Visit: Payer: Medicare HMO | Admitting: Thoracic Surgery (Cardiothoracic Vascular Surgery)

## 2021-10-25 VITALS — BP 138/85 | HR 87 | Resp 18 | Ht 65.0 in | Wt 165.0 lb

## 2021-10-25 DIAGNOSIS — C3491 Malignant neoplasm of unspecified part of right bronchus or lung: Secondary | ICD-10-CM | POA: Diagnosis not present

## 2021-10-25 DIAGNOSIS — J439 Emphysema, unspecified: Secondary | ICD-10-CM | POA: Diagnosis not present

## 2021-10-25 DIAGNOSIS — C3490 Malignant neoplasm of unspecified part of unspecified bronchus or lung: Secondary | ICD-10-CM | POA: Diagnosis not present

## 2021-10-25 DIAGNOSIS — I700 Atherosclerosis of aorta: Secondary | ICD-10-CM | POA: Diagnosis not present

## 2021-10-25 NOTE — Progress Notes (Signed)
[address]       [place] [address]             [phone]    HPI: [name] returns for a scheduled postoperative follow-up visit  [name] is a 71 year old woman with a history of tobacco abuse (quit 2019), COPD, stage Ib adenocarcinoma of the lung, right middle lobectomy, aortic atherosclerosis, heart murmur, coronary calcification, hiatal hernia, reflux, hyperlipidemia, subclavian stenosis, and pneumonia.  Dr. Servando Snare did a robotic assisted right middle lobectomy for a stage Ib adenocarcinoma in February 2022.  I saw her in February 2023.  She was doing well at that time and no evidence of recurrent disease.  Since then she has continued to do well.  She has not had any respiratory issues.  She is not using an inhaler.  She notices bulging in her left supraclavicular area.  Past Medical History:  Diagnosis Date   Abnormal chest x-ray 01/19/2020   Acute laryngopharyngitis 03/18/2020   Aortic atherosclerosis ([place]) 01/28/2020   Arthritis    Cardiac murmur 03/01/2020   COPD (chronic obstructive pulmonary disease) (HCC)    Coronary artery calcification seen on CT scan 03/01/2020   Cough 01/12/2020   COVID-19 03/10/2020   Dyspnea    on exertion,after getting covid   Family history of adverse reaction to anesthesia    brothr had n/v   GERD (gastroesophageal reflux disease)    Hiatal hernia    Hyperlipemia    Pneumonia    Pre-operative cardiovascular examination 03/01/2020   Right lower lobe lung mass 01/28/2020   Stenosis of left subclavian artery ([place]) 01/28/2020   Visit for screening mammogram 01/28/2020    Current Outpatient Medications  Medication Sig Dispense Refill   Ascorbic Acid (VITAMIN C PO) Take 3 tablets by mouth daily.     cetirizine (ZYRTEC) 10 MG tablet Take 10 mg by mouth daily.     Coenzyme Q10 (CO Q-10 PO) Take 2 tablets by mouth daily.     Fluticasone-Umeclidin-Vilant (TRELEGY ELLIPTA) 100-62.5-25 MCG/ACT AEPB Inhale 1 puff into the  lungs daily. 2 each 0   Misc Natural Products (CRAMP RELEAF PO) Take by mouth.     montelukast (SINGULAIR) 10 MG tablet TAKE 1 TABLET BY MOUTH EVERYDAY AT BEDTIME Strength: 10 mg 90 tablet 1   Multiple Vitamin (MULTIVITAMIN ADULT PO) Take 2 tablets by mouth daily.     Multiple Vitamins-Minerals (VITAMIN D3 COMPLETE PO) Take by mouth.     mupirocin ointment (BACTROBAN) 2 % Apply 1 application topically 2 (two) times daily. 22 g 2   omeprazole (PRILOSEC) 20 MG capsule TAKE 1 CAPSULE (20 MG TOTAL) BY MOUTH 2 (TWO) TIMES DAILY BEFORE A MEAL. 180 capsule 1   rosuvastatin (CRESTOR) 5 MG tablet Take 1 tablet (5 mg total) by mouth daily. 90 tablet 1   VITAMIN D PO Take 2 tablets by mouth daily.     No current facility-administered medications for this visit.    Physical Exam BP 138/85 (BP Location: Left Arm, Patient Position: Sitting)   Pulse 87   Resp 18   Ht 5\' 5"  (1.651 m)   Wt 165 lb (74.8 kg)   SpO2 95% Comment: RA  BMI 27.46 kg/m  71 year old woman in no acute distress Alert and oriented x3 with no focal deficits Lungs clear with equal step sounds bilaterally Prominent fatty tissue in left supraclavicular area without palpable adenopathy Cardiac regular rate and rhythm with 2/6 murmur  Diagnostic Tests: CT  CHEST WITHOUT CONTRAST   TECHNIQUE: Multidetector CT imaging of the chest was performed following the standard protocol without IV contrast.   RADIATION DOSE REDUCTION: This exam was performed according to the departmental dose-optimization program which includes automated exposure control, adjustment of the mA and/or kV according to patient size and/or use of iterative reconstruction technique.   COMPARISON:  Multiple previous imaging studies. The most recent chest CT is 04/19/2021   FINDINGS: Cardiovascular: The heart is normal in size. No pericardial effusion. Stable atherosclerotic calcifications involving the aorta and coronary arteries.   Mediastinum/Nodes: Small  scattered mediastinal and hilar lymph nodes are stable. No mass or overt adenopathy. The esophagus is grossly. There is a stable epicardial cyst noted on the right side which measures 3.4 by 2.5 cm.   Lungs/Pleura: Stable surgical changes from a right middle lobe lobectomy. No findings suspicious for residual or recurrent tumor.   Stable nodular apical densities likely chronic scarring changes. The 6 mm right apical nodular lesion is unchanged and appears to be part of scarring change. No new pulmonary lesions pulmonary nodules. No acute pulmonary process. Stable underlying emphysematous changes and pulmonary scarring. No pleural effusions or pleural nodules.   Upper Abdomen: Diffuse fatty infiltration of the liver. No hepatic or adrenal gland lesions are identified. No upper abdominal adenopathy. Stable vascular calcifications.   Musculoskeletal: No breast masses, supraclavicular axillary adenopathy. The bony thorax is intact.   IMPRESSION: 1. Stable surgical changes from a right middle lobe lobectomy. No findings suspicious for recurrent tumor, mediastinal/hilar adenopathy or metastatic disease elsewhere. 2. Stable apical nodular densities likely chronic scarring changes. 3. Stable emphysematous changes and pulmonary scarring. 4. Stable epicardial cyst. 5. Diffuse fatty infiltration of the liver. 6. Stable atherosclerotic calcifications involving the aorta and coronary arteries.   Aortic Atherosclerosis (ICD10-I70.0) and Emphysema (ICD10-J43.9).     Electronically Signed   By: Marijo Sanes M.D.   On: 10/25/2021 11:26 I personally reviewed the CT images.  Postoperative changes present.  No evidence of recurrent disease.  No suspicious findings in left supraclavicular area.  No change in pericardial cyst.  Impression: [name] is a 71 year old woman with a history of tobacco abuse (quit 2019), COPD, stage Ib adenocarcinoma of the lung, right middle lobectomy, aortic  atherosclerosis, heart murmur, coronary calcification, hiatal hernia, reflux, hyperlipidemia, subclavian stenosis, and pneumonia.  Dr. Servando Snare did a robotic assisted right middle lobectomy for a stage Ib adenocarcinoma in February 2022.  Stage Ib adenocarcinoma status post right middle lobectomy-now 18 months out from surgery with no evidence of recurrent disease.  Needs another CT in 6 months.  After that can be followed annually.  Tobacco abuse-quit smoking in 2019.  Left supraclavicular fatty tissue-no distinct mass, but does appear more prominent on the right side on exam.  Reviewed the CT and very carefully with no evidence pathologic finding.  Monitor.  Plan: Return in 6 months with CT chest.  After that can be followed on an annual basis.  Melrose Nakayama, MD [facility] [phone]

## 2021-10-31 ENCOUNTER — Telehealth: Payer: Self-pay

## 2021-10-31 NOTE — Patient Outreach (Signed)
  Care Coordination   10/31/2021 Name: [name] MRN: [id number] DOB: October 13, 1950   Care Coordination Outreach Attempts:  An unsuccessful telephone outreach was attempted today to offer the patient information about available care coordination services as a benefit of their health plan.   Follow Up Plan:  Additional outreach attempts will be made to offer the patient care coordination information and services.   Encounter Outcome:  No Answer  Care Coordination Interventions Activated:  No   Care Coordination Interventions:  No, not indicated    Tomasa Rand, RN, BSN, CEN [facility] [employer] [phone]

## 2021-11-01 ENCOUNTER — Telehealth: Payer: Self-pay | Admitting: Physician Assistant

## 2021-11-04 NOTE — Telephone Encounter (Signed)
NA

## 2021-11-14 ENCOUNTER — Telehealth: Payer: Self-pay

## 2021-11-14 NOTE — Patient Outreach (Signed)
  Care Coordination   Initial Visit Note   11/14/2021 Name: Bryah Ocheltree MRN: [id number] DOB: [birth year]  Rashena Dowling is a 71 y.o. year old female who sees Marge Duncans, [place] for primary care. I spoke with  [name] by phone today.  What matters to the patients health and wellness today?  Placed call to patient today to explain and offer [facility] care coordination program. Patient reports she is doing well. Denies any needs at this time. Patient has completed her AWV. Reminded patient of her upcoming OV.    SDOH assessments and interventions completed:  No     Care Coordination Interventions Activated:  No  Care Coordination Interventions:  No, not indicated   Follow up plan: No further intervention required.   Encounter Outcome:  Pt. Refused   Tomasa Rand, RN, BSN, CEN [facility] [employer] [phone]

## 2021-11-15 DIAGNOSIS — Z4689 Encounter for fitting and adjustment of other specified devices: Secondary | ICD-10-CM | POA: Diagnosis not present

## 2021-11-22 ENCOUNTER — Ambulatory Visit: Payer: Medicare HMO | Admitting: Thoracic Surgery (Cardiothoracic Vascular Surgery)

## 2021-12-08 ENCOUNTER — Encounter: Payer: Self-pay | Admitting: Physician Assistant

## 2021-12-08 ENCOUNTER — Ambulatory Visit (INDEPENDENT_AMBULATORY_CARE_PROVIDER_SITE_OTHER): Payer: Medicare HMO | Admitting: Physician Assistant

## 2021-12-08 VITALS — BP 110/80 | HR 90 | Temp 97.3°F | Ht 65.0 in | Wt 162.2 lb

## 2021-12-08 DIAGNOSIS — E782 Mixed hyperlipidemia: Secondary | ICD-10-CM | POA: Diagnosis not present

## 2021-12-08 DIAGNOSIS — J449 Chronic obstructive pulmonary disease, unspecified: Secondary | ICD-10-CM | POA: Diagnosis not present

## 2021-12-08 DIAGNOSIS — Z23 Encounter for immunization: Secondary | ICD-10-CM

## 2021-12-08 DIAGNOSIS — E559 Vitamin D deficiency, unspecified: Secondary | ICD-10-CM | POA: Diagnosis not present

## 2021-12-08 DIAGNOSIS — Z85118 Personal history of other malignant neoplasm of bronchus and lung: Secondary | ICD-10-CM

## 2021-12-08 NOTE — Progress Notes (Signed)
Subjective:  Patient ID: [name], female    DOB: 1950/07/19  Age: 71 y.o. MRN: [id number]  Chief Complaint  Patient presents with   Hyperlipidemia    Hyperlipidemia    Pt with history of GERD and hiatal hernia- symptoms currently stable on omeprazole 20mg  qd - voices no concerns  Pt with history of lung carcinoma - had a partial lobectomy 03/2020 - states symptoms are stable and is following with specialists as directed - sees Dr Roxan Hockey -   Pt with history of COPD - doing well on Trelegy  Pt with history of hyperlipidemia and aortic atherosclerosis - currently on crestor 5mg  qd and due for labwork  Pt would like flu shot today Current Outpatient Medications on File Prior to Visit  Medication Sig Dispense Refill   Ascorbic Acid (VITAMIN C PO) Take 3 tablets by mouth daily.     Coenzyme Q10 (CO Q-10 PO) Take 2 tablets by mouth daily.     Fluticasone-Umeclidin-Vilant (TRELEGY ELLIPTA) 100-62.5-25 MCG/ACT AEPB Inhale 1 puff into the lungs daily. 2 each 0   Misc Natural Products (CRAMP RELEAF PO) Take by mouth.     montelukast (SINGULAIR) 10 MG tablet TAKE 1 TABLET BY MOUTH EVERYDAY AT BEDTIME Strength: 10 mg 90 tablet 1   Multiple Vitamin (MULTIVITAMIN ADULT PO) Take 2 tablets by mouth daily.     Multiple Vitamins-Minerals (VITAMIN D3 COMPLETE PO) Take by mouth.     mupirocin ointment (BACTROBAN) 2 % Apply 1 application topically 2 (two) times daily. 22 g 2   omeprazole (PRILOSEC) 20 MG capsule TAKE 1 CAPSULE (20 MG TOTAL) BY MOUTH 2 (TWO) TIMES DAILY BEFORE A MEAL. 180 capsule 1   rosuvastatin (CRESTOR) 5 MG tablet Take 1 tablet (5 mg total) by mouth daily. 90 tablet 1   VITAMIN D PO Take 2 tablets by mouth daily.     No current facility-administered medications on file prior to visit.   Past Medical History:  Diagnosis Date   Abnormal chest x-ray 01/19/2020   Acute laryngopharyngitis 03/18/2020   Aortic atherosclerosis ([place]) 01/28/2020   Arthritis    Cardiac murmur  03/01/2020   COPD (chronic obstructive pulmonary disease) (HCC)    Coronary artery calcification seen on CT scan 03/01/2020   Cough 01/12/2020   COVID-19 03/10/2020   Dyspnea    on exertion,after getting covid   Family history of adverse reaction to anesthesia    brothr had n/v   GERD (gastroesophageal reflux disease)    Hiatal hernia    Hyperlipemia    Pneumonia    Pre-operative cardiovascular examination 03/01/2020   Right lower lobe lung mass 01/28/2020   Stenosis of left subclavian artery ([place]) 01/28/2020   Visit for screening mammogram 01/28/2020   History reviewed. No pertinent surgical history.  Family History  Problem Relation Age of Onset   Heart disease Mother    Heart attack Mother    Cancer Father    Diabetes Father    Non-Hodgkin's lymphoma Brother    High Cholesterol Brother    High Cholesterol Brother    Heart disease Brother    Heart attack Brother    Social History   Socioeconomic History   Marital status: Widowed    Spouse name: Not on file   Number of children: Not on file   Years of education: Not on file   Highest education level: Not on file  Occupational History   Not on file  Tobacco Use   Smoking status: Former  Years: 50.00    Types: Cigarettes    Quit date: 03/22/2017    Years since quitting: 4.7   Smokeless tobacco: Never  Vaping Use   Vaping Use: Never used  Substance and Sexual Activity   Alcohol use: Not Currently   Drug use: Never   Sexual activity: Not Currently  Other Topics Concern   Not on file  Social History Narrative   Not on file   Social Determinants of Health   Financial Resource Strain: Low Risk  (08/18/2021)   Overall Financial Resource Strain (CARDIA)    Difficulty of Paying Living Expenses: Not hard at all  Food Insecurity: No Food Insecurity (08/18/2021)   Hunger Vital Sign    Worried About Running Out of Food in the Last Year: Never true    [place] in the Last Year: Never true  Transportation Needs:  No Transportation Needs (08/18/2021)   PRAPARE - Hydrologist (Medical): No    Lack of Transportation (Non-Medical): No  Physical Activity: Inactive (08/18/2021)   Exercise Vital Sign    Days of Exercise per Week: 0 days    Minutes of Exercise per Session: 0 min  Stress: No Stress Concern Present (08/18/2021)   [place]    Feeling of Stress : Not at all  Social Connections: Moderately Isolated (08/18/2021)   Social Connection and Isolation Panel [NHANES]    Frequency of Communication with Friends and Family: More than three times a week    Frequency of Social Gatherings with Friends and Family: More than three times a week    Attends Religious Services: More than 4 times per year    Active Member of [employer] or Organizations: No    Attends Archivist Meetings: Never    Marital Status: Widowed  PHYSICAL EXAM:   VS: BP 110/80 (BP Location: Left Arm, Patient Position: Sitting, Cuff Size: Large)   Pulse 90   Temp (!) 97.3 F (36.3 C) (Temporal)   Ht 5\' 5"  (1.651 m)   Wt 162 lb 3.2 oz (73.6 kg)   SpO2 96%   BMI 26.99 kg/m   GEN: Well nourished, well developed, in no acute distress  HEENT: normal external ears and nose - normal external auditory canals and TMS -- Lips, Teeth and Gums - normal  Oropharynx - normal mucosa, palate, and posterior pharynx Cardiac: RRR; no murmurs, rubs, or gallops,no edema - Respiratory:  normal respiratory rate and pattern with no distress - normal breath sounds with no rales, rhonchi, wheezes or rubs Skin: warm and dry, no rash  Psych: euthymic mood, appropriate affect and demeanor        Objective:  PHYSICAL EXAM:   VS: BP 110/80 (BP Location: Left Arm, Patient Position: Sitting, Cuff Size: Large)   Pulse 90   Temp (!) 97.3 F (36.3 C) (Temporal)   Ht 5\' 5"  (1.651 m)   Wt 162 lb 3.2 oz (73.6 kg)   SpO2 96%   BMI 26.99 kg/m   GEN: Well  nourished, well developed, in no acute distress   Cardiac: RRR; no murmurs, rubs, or gallops,no edema -  Respiratory:  normal respiratory rate and pattern with no distress - normal breath sounds with no rales, rhonchi, wheezes or rubs  Skin: warm and dry, no rash   Psych: euthymic mood, appropriate affect and demeanor   Lab Results  Component Value Date   WBC 6.7 06/10/2021  HGB 13.5 06/10/2021   HCT 42.9 06/10/2021   PLT 336 06/10/2021   GLUCOSE 90 06/10/2021   CHOL 206 (H) 06/10/2021   TRIG 140 06/10/2021   HDL 60 06/10/2021   LDLCALC 121 (H) 06/10/2021   ALT 38 (H) 06/10/2021   AST 34 06/10/2021   NA 147 (H) 06/10/2021   K 4.6 06/10/2021   CL 105 06/10/2021   CREATININE 1.01 (H) 06/10/2021   BUN 18 06/10/2021   CO2 19 (L) 06/10/2021   TSH 3.830 06/09/2020   INR 1.0 03/25/2020      Assessment & Plan:   Problem List Items Addressed This Visit       Cardiovascular and Mediastinum   Aortic atherosclerosis ([place]) Continue current meds     Respiratory   Primary lung cancer, right middle lobe - resected Continue follow up with specialists     Other   Hiatal hernia Continue omeprazole 20mg    Hyperlipemia - Primary   Relevant Orders   CBC with Differential/Platelet   Comprehensive metabolic panel   Lipid panel Continue crestor and watch diet   Other Visit Diagnoses     Vitamin D deficiency       Relevant Orders   VITAMIN D 25 Hydroxy (Vit-D Deficiency, Fractures)      Need for flu vaccine Fluad given                 .  No orders of the defined types were placed in this encounter.   Orders Placed This Encounter  Procedures   Flu Vaccine QUAD High Dose(Fluad)   CBC with Differential/Platelet   Comprehensive metabolic panel   Lipid panel   VITAMIN D 25 Hydroxy (Vit-D Deficiency, Fractures)     Follow-up: Return in about 6 months (around 06/09/2022) for chronic fasting follow up.  An After Visit Summary was printed and given to the  patient.  Yetta Flock [facility] [phone]

## 2021-12-09 ENCOUNTER — Other Ambulatory Visit: Payer: Self-pay | Admitting: Physician Assistant

## 2021-12-09 DIAGNOSIS — R899 Unspecified abnormal finding in specimens from other organs, systems and tissues: Secondary | ICD-10-CM

## 2021-12-09 LAB — COMPREHENSIVE METABOLIC PANEL
ALT: 42 IU/L — ABNORMAL HIGH (ref 0–32)
AST: 50 IU/L — ABNORMAL HIGH (ref 0–40)
Albumin/Globulin Ratio: 1.5 (ref 1.2–2.2)
Albumin: 4.3 g/dL (ref 3.8–4.8)
Alkaline Phosphatase: 69 IU/L (ref 44–121)
BUN/Creatinine Ratio: 9 — ABNORMAL LOW (ref 12–28)
BUN: 9 mg/dL (ref 8–27)
Bilirubin Total: 0.4 mg/dL (ref 0.0–1.2)
CO2: 23 mmol/L (ref 20–29)
Calcium: 9.3 mg/dL (ref 8.7–10.3)
Chloride: 98 mmol/L (ref 96–106)
Creatinine, Ser: 1.01 mg/dL — ABNORMAL HIGH (ref 0.57–1.00)
Globulin, Total: 2.8 g/dL (ref 1.5–4.5)
Glucose: 108 mg/dL — ABNORMAL HIGH (ref 70–99)
Potassium: 4.5 mmol/L (ref 3.5–5.2)
Sodium: 138 mmol/L (ref 134–144)
Total Protein: 7.1 g/dL (ref 6.0–8.5)
eGFR: 59 mL/min/{1.73_m2} — ABNORMAL LOW (ref >59)

## 2021-12-09 LAB — CBC WITH DIFFERENTIAL/PLATELET
Basophils Absolute: 0 10*3/uL (ref 0.0–0.2)
Basos: 1 %
EOS (ABSOLUTE): 0.2 10*3/uL (ref 0.0–0.4)
Eos: 3 %
Hematocrit: 41.1 % (ref 34.0–46.6)
Hemoglobin: 12.8 g/dL (ref 11.1–15.9)
Immature Grans (Abs): 0 10*3/uL (ref 0.0–0.1)
Immature Granulocytes: 0 %
Lymphocytes Absolute: 1.9 10*3/uL (ref 0.7–3.1)
Lymphs: 29 %
MCH: 24.2 pg — ABNORMAL LOW (ref 26.6–33.0)
MCHC: 31.1 g/dL — ABNORMAL LOW (ref 31.5–35.7)
MCV: 78 fL — ABNORMAL LOW (ref 79–97)
Monocytes Absolute: 0.7 10*3/uL (ref 0.1–0.9)
Monocytes: 10 %
Neutrophils Absolute: 3.6 10*3/uL (ref 1.4–7.0)
Neutrophils: 57 %
Platelets: 334 10*3/uL (ref 150–450)
RBC: 5.3 x10E6/uL — ABNORMAL HIGH (ref 3.77–5.28)
RDW: 14.6 % (ref 11.7–15.4)
WBC: 6.4 10*3/uL (ref 3.4–10.8)

## 2021-12-09 LAB — LIPID PANEL
Chol/HDL Ratio: 4.7 ratio — ABNORMAL HIGH (ref 0.0–4.4)
Cholesterol, Total: 206 mg/dL — ABNORMAL HIGH (ref 100–199)
HDL: 44 mg/dL (ref >39)
LDL Chol Calc (NIH): 121 mg/dL — ABNORMAL HIGH (ref 0–99)
Triglycerides: 232 mg/dL — ABNORMAL HIGH (ref 0–149)
VLDL Cholesterol Cal: 41 mg/dL — ABNORMAL HIGH (ref 5–40)

## 2021-12-09 LAB — CARDIOVASCULAR RISK ASSESSMENT

## 2021-12-09 LAB — VITAMIN D 25 HYDROXY (VIT D DEFICIENCY, FRACTURES): Vit D, 25-Hydroxy: 53.2 ng/mL (ref 30.0–100.0)

## 2021-12-12 LAB — IRON,TIBC AND FERRITIN PANEL
Ferritin: 16 ng/mL (ref 15–150)
Iron Saturation: 18 % (ref 15–55)
Iron: 81 ug/dL (ref 27–139)
Total Iron Binding Capacity: 440 ug/dL (ref 250–450)
UIBC: 359 ug/dL (ref 118–369)

## 2021-12-12 LAB — SPECIMEN STATUS REPORT

## 2021-12-16 ENCOUNTER — Ambulatory Visit: Payer: Medicare HMO | Admitting: Physician Assistant

## 2022-01-18 DIAGNOSIS — Z4689 Encounter for fitting and adjustment of other specified devices: Secondary | ICD-10-CM | POA: Diagnosis not present

## 2022-02-02 ENCOUNTER — Other Ambulatory Visit: Payer: Self-pay | Admitting: Physician Assistant

## 2022-02-02 DIAGNOSIS — I700 Atherosclerosis of aorta: Secondary | ICD-10-CM

## 2022-02-02 LAB — HM MAMMOGRAPHY

## 2022-02-03 DIAGNOSIS — Z1231 Encounter for screening mammogram for malignant neoplasm of breast: Secondary | ICD-10-CM | POA: Diagnosis not present

## 2022-02-08 ENCOUNTER — Encounter: Payer: Self-pay | Admitting: Physician Assistant

## 2022-02-16 ENCOUNTER — Ambulatory Visit (INDEPENDENT_AMBULATORY_CARE_PROVIDER_SITE_OTHER): Payer: Medicare HMO | Admitting: Family Medicine

## 2022-02-16 DIAGNOSIS — J069 Acute upper respiratory infection, unspecified: Secondary | ICD-10-CM

## 2022-02-16 DIAGNOSIS — U071 COVID-19: Secondary | ICD-10-CM

## 2022-02-16 DIAGNOSIS — J019 Acute sinusitis, unspecified: Secondary | ICD-10-CM | POA: Insufficient documentation

## 2022-02-16 DIAGNOSIS — J018 Other acute sinusitis: Secondary | ICD-10-CM

## 2022-02-16 DIAGNOSIS — R051 Acute cough: Secondary | ICD-10-CM

## 2022-02-16 LAB — POC COVID19 BINAXNOW: SARS Coronavirus 2 Ag: POSITIVE — AB

## 2022-02-16 MED ORDER — AMOXICILLIN 875 MG PO TABS: 875.0000 mg | ORAL_TABLET | Freq: Two times a day (BID) | ORAL | 0 refills | Status: DC

## 2022-02-16 MED ORDER — ONDANSETRON HCL 4 MG PO TABS: 4.0000 mg | ORAL_TABLET | Freq: Three times a day (TID) | ORAL | 0 refills | Status: DC | PRN

## 2022-02-16 MED ORDER — MOLNUPIRAVIR EUA 200MG CAPSULE: 4.0000 | ORAL_CAPSULE | Freq: Two times a day (BID) | ORAL | 0 refills | Status: AC

## 2022-02-16 NOTE — Patient Instructions (Addendum)
Your COVID test is positive. You should remain isolated and quarantine for at least 5 days from start of symptoms. You must be feeling better and be fever free without any fever reducers for at least 24 hours as well. You should wear a mask at all times when out of your home or around others for 5 days after leaving isolation.  Your household contacts should be tested as well as work contacts. If you feel worse or have increasing shortness of breath, you should be seen in person at urgent care or the emergency room.   Rx: molnupiravir. Recommend rest, fluids, 3 meals per day.  Fever reducing medicines.  OTC cold and congestion medicines.

## 2022-02-16 NOTE — Progress Notes (Signed)
Acute Office Visit  Subjective:    Patient ID: [name], female    DOB: 11/29/1950, 71 y.o.   MRN: [id number]  Chief Complaint  Patient presents with   Covid Positive    HPI: Patient is in today for cough, nasal congestion, sneezing, chills, sweats, nausea, and vomiting for 4 days.  Patient took some Zofran which has helped but she needs some more.  She was exposed to [place].  Numerous family members including her grandchildren had COVID.  She was tested from the parking lot was found to be positive.  She is also having some sinus pain.  Patient has a history of lung cancer and restarted Trelegy.  This is not something she takes all the time.  Denies shortness of breath.  Past Medical History:  Diagnosis Date   Abnormal chest x-ray 01/19/2020   Acute laryngopharyngitis 03/18/2020   Aortic atherosclerosis ([place]) 01/28/2020   Arthritis    Cardiac murmur 03/01/2020   COPD (chronic obstructive pulmonary disease) (HCC)    Coronary artery calcification seen on CT scan 03/01/2020   Cough 01/12/2020   COVID-19 03/10/2020   Dyspnea    on exertion,after getting covid   Family history of adverse reaction to anesthesia    brothr had n/v   GERD (gastroesophageal reflux disease)    Hiatal hernia    Hyperlipemia    Pneumonia    Pre-operative cardiovascular examination 03/01/2020   Right lower lobe lung mass 01/28/2020   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  Family History  Problem Relation Age of Onset   Heart disease Mother    Heart attack Mother    Cancer Father    Diabetes Father    Non-Hodgkin's lymphoma Brother    High Cholesterol Brother    High Cholesterol Brother    Heart disease Brother    Heart attack Brother     Social History   Socioeconomic History   Marital status: Widowed    Spouse name: Not on file   Number of children: Not on file   Years of education: Not on file    Highest education level: Not on file  Occupational History   Not on file  Tobacco Use   Smoking status: Former    Years: 50.00    Types: Cigarettes    Quit date: 03/22/2017    Years since quitting: 4.9   Smokeless tobacco: Never  Vaping Use   Vaping Use: Never used  Substance and Sexual Activity   Alcohol use: Not Currently   Drug use: Never   Sexual activity: Not Currently  Other Topics Concern   Not on file  Social History Narrative   Not on file   Social Determinants of Health   Financial Resource Strain: Low Risk  (08/18/2021)   Overall Financial Resource Strain (CARDIA)    Difficulty of Paying Living Expenses: Not hard at all  Food Insecurity: No [place] (08/18/2021)   Hunger Vital Sign    Worried About Running Out of Food in the Last Year: Never true    [place] in the Last Year: Never true  Transportation Needs: No Transportation Needs (08/18/2021)   PRAPARE - Hydrologist (Medical): No    Lack of Transportation (Non-Medical): No  Physical Activity: Inactive (08/18/2021)   Exercise Vital Sign    Days of Exercise per Week: 0 days  Minutes of Exercise per Session: 0 min  Stress: No Stress Concern Present (08/18/2021)   [place]    Feeling of Stress : Not at all  Social Connections: Moderately Isolated (08/18/2021)   Social Connection and Isolation Panel [NHANES]    Frequency of Communication with Friends and Family: More than three times a week    Frequency of Social Gatherings with Friends and Family: More than three times a week    Attends Religious Services: More than 4 times per year    Active Member of [employer] or Organizations: No    Attends Archivist Meetings: Never    Marital Status: Widowed  Intimate Partner Violence: Not At Risk (08/18/2021)   Humiliation, Afraid, Rape, and Kick questionnaire    Fear of Current or Ex-Partner: No     Emotionally Abused: No    Physically Abused: No    Sexually Abused: No    Outpatient Medications Prior to Visit  Medication Sig Dispense Refill   Ascorbic Acid (VITAMIN C PO) Take 3 tablets by mouth daily.     Coenzyme Q10 (CO Q-10 PO) Take 2 tablets by mouth daily.     Fluticasone-Umeclidin-Vilant (TRELEGY ELLIPTA) 100-62.5-25 MCG/ACT AEPB Inhale 1 puff into the lungs daily. 2 each 0   Misc Natural Products (CRAMP RELEAF PO) Take by mouth.     montelukast (SINGULAIR) 10 MG tablet TAKE 1 TABLET AT BEDTIME 90 tablet 3   Multiple Vitamin (MULTIVITAMIN ADULT PO) Take 2 tablets by mouth daily.     Multiple Vitamins-Minerals (VITAMIN D3 COMPLETE PO) Take by mouth.     mupirocin ointment (BACTROBAN) 2 % Apply 1 application topically 2 (two) times daily. 22 g 2   omeprazole (PRILOSEC) 20 MG capsule TAKE 1 CAPSULE TWICE DAILY BEFORE MEALS 180 capsule 3   rosuvastatin (CRESTOR) 5 MG tablet TAKE 1 TABLET EVERY DAY 90 tablet 3   VITAMIN D PO Take 2 tablets by mouth daily.     No facility-administered medications prior to visit.    Allergies  Allergen Reactions   Codeine Hives   Surgical Lubricant Hives    Review of Systems  Constitutional:  Positive for chills, fatigue and fever.  HENT:  Positive for congestion and sinus pain. Negative for ear pain and sore throat.   Respiratory:  Positive for cough. Negative for shortness of breath.   Cardiovascular:  Negative for chest pain.  Gastrointestinal:  Positive for nausea and vomiting. Negative for constipation and diarrhea.       Objective:    Patient was seen in the parking lot from a car window. Physical Exam Constitutional:      Appearance: Normal appearance.  HENT:     Nose:     Comments: Left maxillary sinus tenderness. Neurological:     Mental Status: She is alert.     There were no vitals taken for this visit. Wt Readings from Last 3 Encounters:  12/08/21 162 lb 3.2 oz (73.6 kg)  10/25/21 165 lb (74.8 kg)  08/18/21 163  lb (73.9 kg)    There are no preventive care reminders to display for this patient.  There are no preventive care reminders to display for this patient.   Lab Results  Component Value Date   TSH 3.830 06/09/2020   Lab Results  Component Value Date   WBC 6.4 12/08/2021   HGB 12.8 12/08/2021   HCT 41.1 12/08/2021   MCV 78 (L) 12/08/2021   PLT 334  12/08/2021   Lab Results  Component Value Date   NA 138 12/08/2021   K 4.5 12/08/2021   CO2 23 12/08/2021   GLUCOSE 108 (H) 12/08/2021   BUN 9 12/08/2021   CREATININE 1.01 (H) 12/08/2021   BILITOT 0.4 12/08/2021   ALKPHOS 69 12/08/2021   AST 50 (H) 12/08/2021   ALT 42 (H) 12/08/2021   PROT 7.1 12/08/2021   ALBUMIN 4.3 12/08/2021   CALCIUM 9.3 12/08/2021   ANIONGAP 7 03/31/2020   EGFR 59 (L) 12/08/2021   Lab Results  Component Value Date   CHOL 206 (H) 12/08/2021   Lab Results  Component Value Date   HDL 44 12/08/2021   Lab Results  Component Value Date   LDLCALC 121 (H) 12/08/2021   Lab Results  Component Value Date   TRIG 232 (H) 12/08/2021   Lab Results  Component Value Date   CHOLHDL 4.7 (H) 12/08/2021   No results found for: "HGBA1C"     Assessment & Plan:   Problem List Items Addressed This Visit       Respiratory   Upper respiratory tract infection due to COVID-19 virus    Your COVID test is positive. You should remain isolated and quarantine for at least 5 days from start of symptoms. You must be feeling better and be fever free without any fever reducers for at least 24 hours as well. You should wear a mask at all times when out of your home or around others for 5 days after leaving isolation.  Your household contacts should be tested as well as work contacts. If you feel worse or have increasing shortness of breath, you should be seen in person at urgent care or the emergency room.   Rx: molnupiravir and zofran sent.  Recommend rest, fluids, 3 meals per day.  Fever reducing medicines.  OTC cold  and congestion medicines.        Relevant Medications   molnupiravir EUA (LAGEVRIO) 200 mg CAPS capsule   ondansetron (ZOFRAN) 4 MG tablet   Other Relevant Orders   POC COVID-19 BinaxNow (Completed)   Acute infection of nasal sinus    Prescription: amoxicillin      Relevant Medications   molnupiravir EUA (LAGEVRIO) 200 mg CAPS capsule   amoxicillin (AMOXIL) 875 MG tablet     Other   RESOLVED: Cough - Primary   Meds ordered this encounter  Medications   molnupiravir EUA (LAGEVRIO) 200 mg CAPS capsule    Sig: Take 4 capsules (800 mg total) by mouth 2 (two) times daily for 5 days.    Dispense:  40 capsule    Refill:  0   ondansetron (ZOFRAN) 4 MG tablet    Sig: Take 1 tablet (4 mg total) by mouth every 8 (eight) hours as needed for nausea or vomiting.    Dispense:  30 tablet    Refill:  0   amoxicillin (AMOXIL) 875 MG tablet    Sig: Take 1 tablet (875 mg total) by mouth 2 (two) times daily.    Dispense:  20 tablet    Refill:  0    Orders Placed This Encounter  Procedures   POC COVID-19 BinaxNow     Follow-up: No follow-ups on file.  An After Visit Summary was printed and given to the patient.  Rochel Brome, MD [facility] [phone]

## 2022-02-21 ENCOUNTER — Encounter: Payer: Self-pay | Admitting: Family Medicine

## 2022-02-21 NOTE — Assessment & Plan Note (Signed)
Your COVID test is positive. You should remain isolated and quarantine for at least 5 days from start of symptoms. You must be feeling better and be fever free without any fever reducers for at least 24 hours as well. You should wear a mask at all times when out of your home or around others for 5 days after leaving isolation.  Your household contacts should be tested as well as work contacts. If you feel worse or have increasing shortness of breath, you should be seen in person at urgent care or the emergency room.   Rx: molnupiravir and zofran sent.  Recommend rest, fluids, 3 meals per day.  Fever reducing medicines.  OTC cold and congestion medicines.

## 2022-02-21 NOTE — Assessment & Plan Note (Signed)
Prescription: amoxicillin

## 2022-03-21 ENCOUNTER — Other Ambulatory Visit: Payer: Self-pay | Admitting: Thoracic Surgery (Cardiothoracic Vascular Surgery)

## 2022-03-21 DIAGNOSIS — C3490 Malignant neoplasm of unspecified part of unspecified bronchus or lung: Secondary | ICD-10-CM

## 2022-03-27 DIAGNOSIS — Z4689 Encounter for fitting and adjustment of other specified devices: Secondary | ICD-10-CM | POA: Diagnosis not present

## 2022-04-12 ENCOUNTER — Ambulatory Visit (INDEPENDENT_AMBULATORY_CARE_PROVIDER_SITE_OTHER): Payer: Medicare HMO | Admitting: Physician Assistant

## 2022-04-12 ENCOUNTER — Encounter: Payer: Self-pay | Admitting: Physician Assistant

## 2022-04-12 VITALS — BP 132/82 | HR 96 | Temp 97.6°F | Ht 68.0 in | Wt 164.0 lb

## 2022-04-12 DIAGNOSIS — M5441 Lumbago with sciatica, right side: Secondary | ICD-10-CM | POA: Diagnosis not present

## 2022-04-12 DIAGNOSIS — J060 Acute laryngopharyngitis: Secondary | ICD-10-CM | POA: Diagnosis not present

## 2022-04-12 MED ORDER — AMOXICILLIN-POT CLAVULANATE 875-125 MG PO TABS: 1.0000 | ORAL_TABLET | Freq: Two times a day (BID) | ORAL | 0 refills | Status: DC

## 2022-04-12 MED ORDER — TRIAMCINOLONE ACETONIDE 40 MG/ML IJ SUSP
80.0000 mg | Freq: Once | INTRAMUSCULAR | Status: AC
  Administered 2022-04-12: 80 mg via INTRAMUSCULAR

## 2022-04-12 MED ORDER — AZITHROMYCIN 250 MG PO TABS: ORAL_TABLET | ORAL | 0 refills | Status: AC

## 2022-04-12 MED ORDER — ALBUTEROL SULFATE HFA 108 (90 BASE) MCG/ACT IN AERS: 2.0000 | INHALATION_SPRAY | Freq: Four times a day (QID) | RESPIRATORY_TRACT | 2 refills | Status: DC | PRN

## 2022-04-12 MED ORDER — PROMETHAZINE-DM 6.25-15 MG/5ML PO SYRP: 5.0000 mL | ORAL_SOLUTION | Freq: Four times a day (QID) | ORAL | 0 refills | Status: DC | PRN

## 2022-04-12 NOTE — Progress Notes (Signed)
Acute Office Visit  Subjective:    Patient ID: [name], female    DOB: 1950/06/14, 72 y.o.   MRN: [id number]  Chief Complaint  Patient presents with   Tailbone Pain    Starts in right buttocks and radiates down right leg to knee, feels like leg is asleep, comes and goes for the past couple of months but has worsened over the past 3 weeks. Nothing makes it worse or better. In the past has been rx'd muscle relaxants but they make her sleepy.    HPI Patient is in today for complaints of right lower back pain that extends down her left leg.  She states she has had intermittent issues since working on flooring back in October.  States it does not hurt all the time but that 'she has to do something' to trigger it.  She was cleaning a few weeks ago and pain has recurred.  She is taking ibuprofen at home.  She has a muscle relaxant but has not taken any yet.  Pt does not want to take oral prednisone and does not want to be referred for further evaluation (xray/MRI, etc)  Pt states she has had a slight productive cough for the past week as well and requests antibiotic - has a history of lung cancer and concerned about getting bronchitis.  States several patients in her home with similar symptoms.  She denies fever, malaise - has had mild sinus congestion as well  Past Medical History:  Diagnosis Date   Abnormal chest x-ray 01/19/2020   Acute laryngopharyngitis 03/18/2020   Aortic atherosclerosis ([place]) 01/28/2020   Arthritis    Cardiac murmur 03/01/2020   COPD (chronic obstructive pulmonary disease) (HCC)    Coronary artery calcification seen on CT scan 03/01/2020   Cough 01/12/2020   COVID-19 03/10/2020   Dyspnea    on exertion,after getting covid   Family history of adverse reaction to anesthesia    brothr had n/v   GERD (gastroesophageal reflux disease)    Hiatal hernia    Hyperlipemia    Pneumonia    Pre-operative cardiovascular examination 03/01/2020   Right lower lobe lung mass  01/28/2020   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 No pertinent surgical history.  Family History  Problem Relation Age of Onset   Heart disease Mother    Heart attack Mother    Cancer Father    Diabetes Father    Non-Hodgkin's lymphoma Brother    High Cholesterol Brother    High Cholesterol Brother    Heart disease Brother    Heart attack Brother     Social History   Socioeconomic History   Marital status: Widowed    Spouse name: Not on file   Number of children: Not on file   Years of education: Not on file   Highest education level: Not on file  Occupational History   Not on file  Tobacco Use   Smoking status: Former    Years: 50.00    Types: Cigarettes    Quit date: 03/22/2017    Years since quitting: 5.0   Smokeless tobacco: Never  Vaping Use   Vaping Use: Never used  Substance and Sexual Activity   Alcohol use: Not Currently   Drug use: Never   Sexual activity: Not Currently  Other Topics Concern   Not on file  Social History Narrative   Not on file   Social  Determinants of Health   Financial Resource Strain: Low Risk  (08/18/2021)   Overall Financial Resource Strain (CARDIA)    Difficulty of Paying Living Expenses: Not hard at all  Food Insecurity: No Food Insecurity (08/18/2021)   Hunger Vital Sign    Worried About Running Out of Food in the Last Year: Never true    Ran Out of Food in the Last Year: Never true  Transportation Needs: No Transportation Needs (08/18/2021)   PRAPARE - Hydrologist (Medical): No    Lack of Transportation (Non-Medical): No  Physical Activity: Inactive (08/18/2021)   Exercise Vital Sign    Days of Exercise per Week: 0 days    Minutes of Exercise per Session: 0 min  Stress: No Stress Concern Present (08/18/2021)   [place]    Feeling of Stress : Not at  all  Social Connections: Moderately Isolated (08/18/2021)   Social Connection and Isolation Panel [NHANES]    Frequency of Communication with Friends and Family: More than three times a week    Frequency of Social Gatherings with Friends and Family: More than three times a week    Attends Religious Services: More than 4 times per year    Active Member of [employer] or Organizations: No    Attends Archivist Meetings: Never    Marital Status: Widowed  Intimate Partner Violence: Not At Risk (08/18/2021)   Humiliation, Afraid, Rape, and Kick questionnaire    Fear of Current or Ex-Partner: No    Emotionally Abused: No    Physically Abused: No    Sexually Abused: No     Current Outpatient Medications:    azithromycin (ZITHROMAX) 250 MG tablet, Take 2 tablets on day 1, then 1 tablet daily on days 2 through 5, Disp: 6 tablet, Rfl: 0   Ascorbic Acid (VITAMIN C PO), Take 3 tablets by mouth daily., Disp: , Rfl:    Coenzyme Q10 (CO Q-10 PO), Take 2 tablets by mouth daily., Disp: , Rfl:    Fluticasone-Umeclidin-Vilant (TRELEGY ELLIPTA) 100-62.5-25 MCG/ACT AEPB, Inhale 1 puff into the lungs daily., Disp: 2 each, Rfl: 0   Misc Natural Products (CRAMP RELEAF PO), Take by mouth., Disp: , Rfl:    montelukast (SINGULAIR) 10 MG tablet, TAKE 1 TABLET AT BEDTIME, Disp: 90 tablet, Rfl: 3   Multiple Vitamin (MULTIVITAMIN ADULT PO), Take 2 tablets by mouth daily., Disp: , Rfl:    Multiple Vitamins-Minerals (VITAMIN D3 COMPLETE PO), Take by mouth., Disp: , Rfl:    mupirocin ointment (BACTROBAN) 2 %, Apply 1 application topically 2 (two) times daily., Disp: 22 g, Rfl: 2   omeprazole (PRILOSEC) 20 MG capsule, TAKE 1 CAPSULE TWICE DAILY BEFORE MEALS, Disp: 180 capsule, Rfl: 3   ondansetron (ZOFRAN) 4 MG tablet, Take 1 tablet (4 mg total) by mouth every 8 (eight) hours as needed for nausea or vomiting., Disp: 30 tablet, Rfl: 0   rosuvastatin (CRESTOR) 5 MG tablet, TAKE 1 TABLET EVERY DAY, Disp: 90 tablet, Rfl:  3   VITAMIN D PO, Take 2 tablets by mouth daily., Disp: , Rfl:    Allergies  Allergen Reactions   Codeine Hives   Surgical Lubricant Hives    CONSTITUTIONAL: Negative for chills, fatigue, fever,  E/N/T: see HPI CARDIOVASCULAR: Negative for chest pain, dizziness, palpitations and pedal edema.  RESPIRATORY:see HPI GASTROINTESTINAL: Negative for abdominal pain, acid reflux symptoms, constipation, diarrhea, nausea and vomiting.  Objective:    PHYSICAL EXAM:   VS: BP 132/82   Pulse 96   Temp 97.6 F (36.4 C)   Ht 5\' 8"  (1.727 m)   Wt 164 lb (74.4 kg)   SpO2 98%   BMI 24.94 kg/m   GEN: Well nourished, well developed, in no acute distress  HEENT: normal external ears and nose - normal external auditory canals and TMS -  - Lips, Teeth and Gums - normal  Oropharynx - mild pnd Cardiac: RRR; no murmurs, rubs,  Respiratory:  normal respiratory rate and pattern with no distress - normal breath sounds with no rales, rhonchi, wheezes or rubs MS: no deformity or atrophy  Skin: warm and dry, no rash    Wt Readings from Last 3 Encounters:  04/12/22 164 lb (74.4 kg)  12/08/21 162 lb 3.2 oz (73.6 kg)  10/25/21 165 lb (74.8 kg)    There are no preventive care reminders to display for this patient.  There are no preventive care reminders to display for this patient.        Assessment & Plan:   Problem List Items Addressed This Visit       Respiratory   Acute laryngopharyngitis   Relevant Medications   azithromycin (ZITHROMAX) 250 MG tablet Recommend mucinex as needed     Acute right-sided back pain with sciatica       Relevant Medications   triamcinolone acetonide (KENALOG-40) injection 80 mg (Completed) Take ibuprofen and muscle relaxant as directed        Meds ordered this encounter  Medications   DISCONTD: amoxicillin-clavulanate (AUGMENTIN) 875-125 MG tablet    Sig: Take 1 tablet by mouth 2 (two) times daily.    Dispense:  20 tablet    Refill:  0     Order Specific Question:   Supervising Provider    Answer:   Shelton Silvas   DISCONTD: promethazine-dextromethorphan (PROMETHAZINE-DM) 6.25-15 MG/5ML syrup    Sig: Take 5 mLs by mouth 4 (four) times daily as needed.    Dispense:  118 mL    Refill:  0    Order Specific Question:   Supervising Provider    Answer:   Shelton Silvas   DISCONTD: albuterol (VENTOLIN HFA) 108 (90 Base) MCG/ACT inhaler    Sig: Inhale 2 puffs into the lungs every 6 (six) hours as needed for wheezing or shortness of breath.    Dispense:  8 g    Refill:  2    Order Specific Question:   Supervising Provider    Answer:   Shelton Silvas   triamcinolone acetonide (KENALOG-40) injection 80 mg   azithromycin (ZITHROMAX) 250 MG tablet    Sig: Take 2 tablets on day 1, then 1 tablet daily on days 2 through 5    Dispense:  6 tablet    Refill:  0    Order Specific Question:   Supervising Provider    Answer:   Rochel Brome [[id number]]     [place], PA-C

## 2022-04-13 ENCOUNTER — Other Ambulatory Visit: Payer: Self-pay | Admitting: Physician Assistant

## 2022-04-13 ENCOUNTER — Telehealth: Payer: Self-pay

## 2022-04-13 MED ORDER — CYCLOBENZAPRINE HCL 5 MG PO TABS: 5.0000 mg | ORAL_TABLET | Freq: Three times a day (TID) | ORAL | 1 refills | Status: DC | PRN

## 2022-04-13 NOTE — Telephone Encounter (Signed)
Rx sent 

## 2022-04-13 NOTE — Telephone Encounter (Signed)
Patient called and stated that she needs flexeril 5 mg called in to [facility] in [place], the ones she has was filled  in 2018.

## 2022-04-28 ENCOUNTER — Encounter: Payer: Self-pay | Admitting: Thoracic Surgery (Cardiothoracic Vascular Surgery)

## 2022-05-02 ENCOUNTER — Ambulatory Visit: Payer: Medicare HMO | Admitting: Thoracic Surgery (Cardiothoracic Vascular Surgery)

## 2022-05-02 ENCOUNTER — Other Ambulatory Visit: Payer: Medicare HMO

## 2022-05-29 DIAGNOSIS — Z4689 Encounter for fitting and adjustment of other specified devices: Secondary | ICD-10-CM | POA: Diagnosis not present

## 2022-05-30 ENCOUNTER — Other Ambulatory Visit: Payer: Medicare HMO

## 2022-06-01 ENCOUNTER — Encounter: Payer: Self-pay | Admitting: Physician Assistant

## 2022-06-01 ENCOUNTER — Ambulatory Visit (INDEPENDENT_AMBULATORY_CARE_PROVIDER_SITE_OTHER): Payer: Medicare HMO | Admitting: Physician Assistant

## 2022-06-01 VITALS — BP 120/68 | HR 88 | Temp 97.5°F | Resp 12 | Ht 68.0 in | Wt 162.0 lb

## 2022-06-01 DIAGNOSIS — J069 Acute upper respiratory infection, unspecified: Secondary | ICD-10-CM | POA: Diagnosis not present

## 2022-06-01 LAB — POC COVID19 BINAXNOW: SARS Coronavirus 2 Ag: NEGATIVE

## 2022-06-01 MED ORDER — CEFDINIR 300 MG PO CAPS
300.0000 mg | ORAL_CAPSULE | Freq: Two times a day (BID) | ORAL | 0 refills | Status: DC
Start: 2022-06-01 — End: 2022-06-06

## 2022-06-01 MED ORDER — TRIAMCINOLONE ACETONIDE 40 MG/ML IJ SUSP
60.0000 mg | Freq: Once | INTRAMUSCULAR | Status: AC
  Administered 2022-06-01: 60 mg via INTRAMUSCULAR

## 2022-06-01 NOTE — Progress Notes (Signed)
Acute Office Visit  Subjective:    Patient ID: [name], female    DOB: 1950/09/24, 72 y.o.   MRN: [id number]  Chief Complaint  Patient presents with   Cough   Chest congestion           HPI: Patient is in today for complaints of productive cough and sinus congestion - denies fever or malaise - states now cough is productive States allergies have flared up as well and requests kenalog shot  Past Medical History:  Diagnosis Date   Abnormal chest x-ray 01/19/2020   Acute laryngopharyngitis 03/18/2020   Aortic atherosclerosis 01/28/2020   Arthritis    Cardiac murmur 03/01/2020   COPD (chronic obstructive pulmonary disease)    Coronary artery calcification seen on CT scan 03/01/2020   Cough 01/12/2020   COVID-19 03/10/2020   Dyspnea    on exertion,after getting covid   Family history of adverse reaction to anesthesia    brothr had n/v   GERD (gastroesophageal reflux disease)    Hiatal hernia    Hyperlipemia    Pneumonia    Pre-operative cardiovascular examination 03/01/2020   Right lower lobe lung mass 01/28/2020   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 No pertinent surgical history.  Family History  Problem Relation Age of Onset   Heart disease Mother    Heart attack Mother    Cancer Father    Diabetes Father    Non-Hodgkin's lymphoma Brother    High Cholesterol Brother    High Cholesterol Brother    Heart disease Brother    Heart attack Brother     Social History   Socioeconomic History   Marital status: Widowed    Spouse name: Not on file   Number of children: Not on file   Years of education: Not on file   Highest education level: Not on file  Occupational History   Not on file  Tobacco Use   Smoking status: Former    Years: 50    Types: Cigarettes    Quit date: 03/22/2017    Years since quitting: 5.1   Smokeless tobacco: Never  Vaping Use   Vaping Use: Never used  Substance  and Sexual Activity   Alcohol use: Not Currently   Drug use: Never   Sexual activity: Not Currently  Other Topics Concern   Not on file  Social History Narrative   Not on file   Social Determinants of Health   Financial Resource Strain: Low Risk  (08/18/2021)   Overall Financial Resource Strain (CARDIA)    Difficulty of Paying Living Expenses: Not hard at all  Food Insecurity: No Food Insecurity (08/18/2021)   Hunger Vital Sign    Worried About Running Out of Food in the Last Year: Never true    Ran Out of Food in the Last Year: Never true  Transportation Needs: No Transportation Needs (08/18/2021)   PRAPARE - Administrator, Civil Service (Medical): No    Lack of Transportation (Non-Medical): No  Physical Activity: Inactive (08/18/2021)   Exercise Vital Sign    Days of Exercise per Week: 0 days    Minutes of Exercise per Session: 0 min  Stress: No Stress Concern Present (08/18/2021)   [employer] of Occupational Health - Occupational Stress Questionnaire    Feeling of Stress : Not at all  Social Connections: Moderately Isolated (08/18/2021)   Social Connection and Isolation Panel [NHANES]  Frequency of Communication with Friends and Family: More than three times a week    Frequency of Social Gatherings with Friends and Family: More than three times a week    Attends Religious Services: More than 4 times per year    Active Member of [employer] or Organizations: No    Attends Banker Meetings: Never    Marital Status: Widowed  Intimate Partner Violence: Not At Risk (08/18/2021)   Humiliation, Afraid, Rape, and Kick questionnaire    Fear of Current or Ex-Partner: No    Emotionally Abused: No    Physically Abused: No    Sexually Abused: No    Outpatient Medications Prior to Visit  Medication Sig Dispense Refill   albuterol (VENTOLIN HFA) 108 (90 Base) MCG/ACT inhaler Inhale into the lungs.     Ascorbic Acid (VITAMIN C PO) Take 3 tablets by mouth daily.      cetirizine (ZYRTEC) 10 MG tablet Take by mouth.     Coenzyme Q10 (CO Q-10 PO) Take 2 tablets by mouth daily.     cyclobenzaprine (FLEXERIL) 5 MG tablet Take 1 tablet (5 mg total) by mouth 3 (three) times daily as needed for muscle spasms. 30 tablet 1   Fluticasone-Umeclidin-Vilant (TRELEGY ELLIPTA) 100-62.5-25 MCG/ACT AEPB Inhale 1 puff into the lungs daily. 2 each 0   montelukast (SINGULAIR) 10 MG tablet TAKE 1 TABLET AT BEDTIME 90 tablet 3   Multiple Vitamin (MULTIVITAMIN ADULT PO) Take 2 tablets by mouth daily.     Multiple Vitamins-Minerals (VITAMIN D3 COMPLETE PO) Take by mouth.     mupirocin ointment (BACTROBAN) 2 % Apply 1 application topically 2 (two) times daily. 22 g 2   omeprazole (PRILOSEC) 20 MG capsule TAKE 1 CAPSULE TWICE DAILY BEFORE MEALS 180 capsule 3   ondansetron (ZOFRAN) 4 MG tablet Take 1 tablet (4 mg total) by mouth every 8 (eight) hours as needed for nausea or vomiting. 30 tablet 0   rosuvastatin (CRESTOR) 5 MG tablet TAKE 1 TABLET EVERY DAY 90 tablet 3   VITAMIN D PO Take 2 tablets by mouth daily.     Misc Natural Products (CRAMP RELEAF PO) Take by mouth.     No facility-administered medications prior to visit.    Allergies  Allergen Reactions   Codeine Hives   Surgical Lubricant Hives    Review of Systems CONSTITUTIONAL: Negative for chills, fatigue, fever,  E/N/T: see HPI CARDIOVASCULAR: Negative for chest pain, dizziness RESPIRATORY: see HPI GASTROINTESTINAL: Negative for abdominal pain, acid reflux symptoms, constipation, diarrhea, nausea and vomiting.       Objective:    PHYSICAL EXAM:   VS: BP 120/68   Pulse 88   Temp (!) 97.5 F (36.4 C)   Resp 12   Ht 5\' 8"  (1.727 m)   Wt 162 lb (73.5 kg)   SpO2 97%   BMI 24.63 kg/m   GEN: Well nourished, well developed, in no acute distress  HEENT: normal external ears and nose - normal external auditory canals and TMS  - Lips, Teeth and Gums - normal  Oropharynx - mild erythema Cardiac: RRR; no  murmurs,  Respiratory:  normal respiratory rate and pattern with no distress - normal breath sounds with no rales, rhonchi, wheezes or rubs  Office Visit on 06/01/2022  Component Date Value Ref Range Status   SARS Coronavirus 2 Ag 06/01/2022 Negative  Negative Final      There are no preventive care reminders to display for this patient.  There are no  preventive care reminders to display for this patient.   Lab Results  Component Value Date   TSH 3.830 06/09/2020   Lab Results  Component Value Date   WBC 6.4 12/08/2021   HGB 12.8 12/08/2021   HCT 41.1 12/08/2021   MCV 78 (L) 12/08/2021   PLT 334 12/08/2021   Lab Results  Component Value Date   NA 138 12/08/2021   K 4.5 12/08/2021   CO2 23 12/08/2021   GLUCOSE 108 (H) 12/08/2021   BUN 9 12/08/2021   CREATININE 1.01 (H) 12/08/2021   BILITOT 0.4 12/08/2021   ALKPHOS 69 12/08/2021   AST 50 (H) 12/08/2021   ALT 42 (H) 12/08/2021   PROT 7.1 12/08/2021   ALBUMIN 4.3 12/08/2021   CALCIUM 9.3 12/08/2021   ANIONGAP 7 03/31/2020   EGFR 59 (L) 12/08/2021   Lab Results  Component Value Date   CHOL 206 (H) 12/08/2021   Lab Results  Component Value Date   HDL 44 12/08/2021   Lab Results  Component Value Date   LDLCALC 121 (H) 12/08/2021   Lab Results  Component Value Date   TRIG 232 (H) 12/08/2021   Lab Results  Component Value Date   CHOLHDL 4.7 (H) 12/08/2021   No results found for: "HGBA1C"     Assessment & Plan:  URI, acute -     POC COVID-19 BinaxNow -     Triamcinolone Acetonide 60mg  IM -     Cefdinir; Take 1 capsule (300 mg total) by mouth 2 (two) times daily.  Dispense: 20 capsule; Refill: 0     Meds ordered this encounter  Medications   triamcinolone acetonide (KENALOG-40) injection 60 mg   cefdinir (OMNICEF) 300 MG capsule    Sig: Take 1 capsule (300 mg total) by mouth 2 (two) times daily.    Dispense:  20 capsule    Refill:  0    Order Specific Question:   Supervising Provider    AnswerCorey Harold:    COX, KIRSTEN [[id number]]    Orders Placed This Encounter  Procedures   POC COVID-19     Follow-up: Return if symptoms worsen or fail to improve.  An After Visit Summary was printed and given to the patient.  Jettie PaganSARA R Yerachmiel Spinney, PA-C [facility] [phone]

## 2022-06-02 ENCOUNTER — Telehealth: Payer: Self-pay

## 2022-06-02 ENCOUNTER — Other Ambulatory Visit: Payer: Self-pay | Admitting: Physician Assistant

## 2022-06-02 MED ORDER — AZITHROMYCIN 250 MG PO TABS: ORAL_TABLET | ORAL | 0 refills | Status: AC

## 2022-06-02 NOTE — Telephone Encounter (Signed)
Patient informed. 

## 2022-06-02 NOTE — Telephone Encounter (Signed)
Patient called stating that the Antibiotic has made her sick. She states that she threw up even though she ate when taking the medication and states that she is requesting a different antibiotic.

## 2022-06-05 ENCOUNTER — Telehealth: Payer: Self-pay

## 2022-06-05 NOTE — Telephone Encounter (Signed)
Patient called and stated that she was seen on 06/01/22 and has been in the bed sick the last 3 days. Stated her cough is not getting any better she has been taking Robitussin and it is not helping, patient denies SOB and fever. Has an appointment for Tuesday.

## 2022-06-06 ENCOUNTER — Encounter: Payer: Self-pay | Admitting: Thoracic Surgery (Cardiothoracic Vascular Surgery)

## 2022-06-06 ENCOUNTER — Encounter: Payer: Self-pay | Admitting: Physician Assistant

## 2022-06-06 ENCOUNTER — Ambulatory Visit (INDEPENDENT_AMBULATORY_CARE_PROVIDER_SITE_OTHER): Payer: Medicare HMO | Admitting: Physician Assistant

## 2022-06-06 ENCOUNTER — Ambulatory Visit: Payer: Medicare HMO | Admitting: Thoracic Surgery (Cardiothoracic Vascular Surgery)

## 2022-06-06 ENCOUNTER — Ambulatory Visit
Admission: RE | Admit: 2022-06-06 | Discharge: 2022-06-06 | Disposition: A | Discharge status: Home / Self Care | Payer: Medicare HMO | Source: Ambulatory Visit | Attending: Thoracic Surgery (Cardiothoracic Vascular Surgery) | Admitting: Thoracic Surgery (Cardiothoracic Vascular Surgery)

## 2022-06-06 VITALS — BP 130/78 | HR 92 | Temp 97.9°F | Ht 68.0 in | Wt 157.0 lb

## 2022-06-06 VITALS — BP 119/77 | HR 96 | Resp 20 | Wt 157.0 lb

## 2022-06-06 DIAGNOSIS — C3490 Malignant neoplasm of unspecified part of unspecified bronchus or lung: Secondary | ICD-10-CM

## 2022-06-06 DIAGNOSIS — R590 Localized enlarged lymph nodes: Secondary | ICD-10-CM | POA: Diagnosis not present

## 2022-06-06 DIAGNOSIS — I700 Atherosclerosis of aorta: Secondary | ICD-10-CM | POA: Diagnosis not present

## 2022-06-06 DIAGNOSIS — J069 Acute upper respiratory infection, unspecified: Secondary | ICD-10-CM | POA: Diagnosis not present

## 2022-06-06 DIAGNOSIS — J439 Emphysema, unspecified: Secondary | ICD-10-CM | POA: Diagnosis not present

## 2022-06-06 MED ORDER — PREDNISONE 20 MG PO TABS
20.0000 mg | ORAL_TABLET | Freq: Two times a day (BID) | ORAL | 0 refills | Status: DC
Start: 2022-06-06 — End: 2022-07-11

## 2022-06-06 MED ORDER — BENZONATATE 200 MG PO CAPS
200.0000 mg | ORAL_CAPSULE | Freq: Three times a day (TID) | ORAL | 0 refills | Status: DC | PRN
Start: 2022-06-06 — End: 2022-10-13

## 2022-06-06 NOTE — Progress Notes (Signed)
Acute Office Visit  Subjective:    Patient ID: [name], female    DOB: September 08, 1950, 72 y.o.   MRN: [id number]  Chief Complaint  Patient presents with   Congestion/cough    HPI: Patient is in today for recheck - she was treated recently for uri - she is currently taking a zpack and had a shot of kenalog last week - she is taking singulair but not taking zyrtec She is having nasal congestion and cough.  She states she is actually feeling much better today than she was when she called for appt.  She denies fever and her malaise has improved  Past Medical History:  Diagnosis Date   Abnormal chest x-ray 01/19/2020   Acute laryngopharyngitis 03/18/2020   Aortic atherosclerosis 01/28/2020   Arthritis    Cardiac murmur 03/01/2020   COPD (chronic obstructive pulmonary disease)    Coronary artery calcification seen on CT scan 03/01/2020   Cough 01/12/2020   COVID-19 03/10/2020   Dyspnea    on exertion,after getting covid   Family history of adverse reaction to anesthesia    brothr had n/v   GERD (gastroesophageal reflux disease)    Hiatal hernia    Hyperlipemia    Pneumonia    Pre-operative cardiovascular examination 03/01/2020   Right lower lobe lung mass 01/28/2020   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 No pertinent surgical history.  Family History  Problem Relation Age of Onset   Heart disease Mother    Heart attack Mother    Cancer Father    Diabetes Father    Non-Hodgkin's lymphoma Brother    High Cholesterol Brother    High Cholesterol Brother    Heart disease Brother    Heart attack Brother     Social History   Socioeconomic History   Marital status: Widowed    Spouse name: Not on file   Number of children: Not on file   Years of education: Not on file   Highest education level: Not on file  Occupational History   Not on file  Tobacco Use   Smoking status: Former    Years: 50     Types: Cigarettes    Quit date: 03/22/2017    Years since quitting: 5.2   Smokeless tobacco: Never  Vaping Use   Vaping Use: Never used  Substance and Sexual Activity   Alcohol use: Not Currently   Drug use: Never   Sexual activity: Not Currently  Other Topics Concern   Not on file  Social History Narrative   Not on file   Social Determinants of Health   Financial Resource Strain: Low Risk  (08/18/2021)   Overall Financial Resource Strain (CARDIA)    Difficulty of Paying Living Expenses: Not hard at all  Food Insecurity: No Food Insecurity (08/18/2021)   Hunger Vital Sign    Worried About Running Out of Food in the Last Year: Never true    Ran Out of Food in the Last Year: Never true  Transportation Needs: No Transportation Needs (08/18/2021)   PRAPARE - Administrator, Civil Service (Medical): No    Lack of Transportation (Non-Medical): No  Physical Activity: Inactive (08/18/2021)   Exercise Vital Sign    Days of Exercise per Week: 0 days    Minutes of Exercise per Session: 0 min  Stress: No Stress Concern Present (08/18/2021)   [employer] of Occupational Health - Occupational  Stress Questionnaire    Feeling of Stress : Not at all  Social Connections: Moderately Isolated (08/18/2021)   Social Connection and Isolation Panel [NHANES]    Frequency of Communication with Friends and Family: More than three times a week    Frequency of Social Gatherings with Friends and Family: More than three times a week    Attends Religious Services: More than 4 times per year    Active Member of [employer] or Organizations: No    Attends Banker Meetings: Never    Marital Status: Widowed  Intimate Partner Violence: Not At Risk (08/18/2021)   Humiliation, Afraid, Rape, and Kick questionnaire    Fear of Current or Ex-Partner: No    Emotionally Abused: No    Physically Abused: No    Sexually Abused: No    Outpatient Medications Prior to Visit  Medication Sig Dispense  Refill   albuterol (VENTOLIN HFA) 108 (90 Base) MCG/ACT inhaler Inhale into the lungs.     Ascorbic Acid (VITAMIN C PO) Take 3 tablets by mouth daily.     azithromycin (ZITHROMAX) 250 MG tablet Take 2 tablets on day 1, then 1 tablet daily on days 2 through 5 6 tablet 0   cetirizine (ZYRTEC) 10 MG tablet Take by mouth.     Coenzyme Q10 (CO Q-10 PO) Take 2 tablets by mouth daily.     cyclobenzaprine (FLEXERIL) 5 MG tablet Take 1 tablet (5 mg total) by mouth 3 (three) times daily as needed for muscle spasms. 30 tablet 1   Fluticasone-Umeclidin-Vilant (TRELEGY ELLIPTA) 100-62.5-25 MCG/ACT AEPB Inhale 1 puff into the lungs daily. 2 each 0   montelukast (SINGULAIR) 10 MG tablet TAKE 1 TABLET AT BEDTIME 90 tablet 3   Multiple Vitamin (MULTIVITAMIN ADULT PO) Take 2 tablets by mouth daily.     Multiple Vitamins-Minerals (VITAMIN D3 COMPLETE PO) Take by mouth.     mupirocin ointment (BACTROBAN) 2 % Apply 1 application topically 2 (two) times daily. 22 g 2   omeprazole (PRILOSEC) 20 MG capsule TAKE 1 CAPSULE TWICE DAILY BEFORE MEALS 180 capsule 3   ondansetron (ZOFRAN) 4 MG tablet Take 1 tablet (4 mg total) by mouth every 8 (eight) hours as needed for nausea or vomiting. 30 tablet 0   rosuvastatin (CRESTOR) 5 MG tablet TAKE 1 TABLET EVERY DAY 90 tablet 3   VITAMIN D PO Take 2 tablets by mouth daily.     cefdinir (OMNICEF) 300 MG capsule Take 1 capsule (300 mg total) by mouth 2 (two) times daily. 20 capsule 0   No facility-administered medications prior to visit.    Allergies  Allergen Reactions   Codeine Hives   Surgical Lubricant Hives    Review of Systems CONSTITUTIONAL: Negative for chills, fatigue, fever,  E/N/T: see HPI CARDIOVASCULAR: Negative for chest pain, dizziness, palpitations  RESPIRATORY: see HPI GASTROINTESTINAL: Negative for abdominal pain, acid reflux symptoms, constipation, diarrhea, nausea and vomiting.          Objective:    PHYSICAL EXAM:   VS: BP 130/78 (BP  Location: Left Arm, Patient Position: Sitting)   Pulse 92   Temp 97.9 F (36.6 C) (Temporal)   Ht 5\' 8"  (1.727 m)   Wt 157 lb (71.2 kg)   SpO2 93%   BMI 23.87 kg/m   GEN: Well nourished, well developed, in no acute distress  HEENT: normal external ears and nose - normal external auditory canals and TMS - - Lips, Teeth and Gums - normal  Oropharynx -  mild erythema Cardiac: RRR; no murmurs,  Respiratory:  faint scattered rare wheeze noted - clears with cough GI: normal bowel sounds, no masses or tenderness    Lab Results  Component Value Date   TSH 3.830 06/09/2020   Lab Results  Component Value Date   WBC 6.4 12/08/2021   HGB 12.8 12/08/2021   HCT 41.1 12/08/2021   MCV 78 (L) 12/08/2021   PLT 334 12/08/2021   Lab Results  Component Value Date   NA 138 12/08/2021   K 4.5 12/08/2021   CO2 23 12/08/2021   GLUCOSE 108 (H) 12/08/2021   BUN 9 12/08/2021   CREATININE 1.01 (H) 12/08/2021   BILITOT 0.4 12/08/2021   ALKPHOS 69 12/08/2021   AST 50 (H) 12/08/2021   ALT 42 (H) 12/08/2021   PROT 7.1 12/08/2021   ALBUMIN 4.3 12/08/2021   CALCIUM 9.3 12/08/2021   ANIONGAP 7 03/31/2020   EGFR 59 (L) 12/08/2021   Lab Results  Component Value Date   CHOL 206 (H) 12/08/2021   Lab Results  Component Value Date   HDL 44 12/08/2021   Lab Results  Component Value Date   LDLCALC 121 (H) 12/08/2021   Lab Results  Component Value Date   TRIG 232 (H) 12/08/2021   Lab Results  Component Value Date   CHOLHDL 4.7 (H) 12/08/2021   No results found for: "HGBA1C"     Assessment & Plan:  URI, acute -     predniSONE; Take 1 tablet (20 mg total) by mouth 2 (two) times daily with a meal.  Dispense: 10 tablet; Refill: 0 -     Benzonatate; Take 1 capsule (200 mg total) by mouth 3 (three) times daily as needed for cough.  Dispense: 30 capsule; Refill: 0   Finish zpack as directed Take zyrtec as directed  Meds ordered this encounter  Medications   predniSONE (DELTASONE) 20 MG  tablet    Sig: Take 1 tablet (20 mg total) by mouth 2 (two) times daily with a meal.    Dispense:  10 tablet    Refill:  0    Order Specific Question:   Supervising Provider    Answer:   Corey Harold   benzonatate (TESSALON) 200 MG capsule    Sig: Take 1 capsule (200 mg total) by mouth 3 (three) times daily as needed for cough.    Dispense:  30 capsule    Refill:  0    Order Specific Question:   Supervising Provider    Answer:   Corey Harold    No orders of the defined types were placed in this encounter.    Follow-up: Return if symptoms worsen or fail to improve.  An After Visit Summary was printed and given to the patient.  Jettie Pagan [facility] [phone]

## 2022-06-06 NOTE — Progress Notes (Signed)
[address]       [name] [address]             [phone]     HPI: [name] returns for a scheduled follow-up regarding previous lung cancer.  [name] is a 72 year old woman with a history of tobacco abuse, COPD, stage Ib adenocarcinoma of the lung, right middle lobectomy, aortic atherosclerosis, heart murmur, coronary calcification, hiatal hernia, reflux, hyperlipidemia, subclavian stenosis, and pneumonia.  Dr. Tyrone Sage did a robotic assisted right middle lobectomy for a stage Ib adenocarcinoma in 2022.  He did her follow-up until retirement and then referred her to me.  I last saw in the office in September 2023.  She was doing well at that time with no evidence of recurrent disease.  She recently has had a respiratory tract infection.  She was coughing up yellow mucus.  She saw her primary care and was started on antibiotics.  She is going to start prednisone after her azithromycin course is completed.  She continues to have a cough and some congestion.  No headaches or visual changes.  Appetite has been poor with the recent illness but was fine prior to that.  Past Medical History:  Diagnosis Date   Abnormal chest x-ray 01/19/2020   Acute laryngopharyngitis 03/18/2020   Aortic atherosclerosis 01/28/2020   Arthritis    Cardiac murmur 03/01/2020   COPD (chronic obstructive pulmonary disease)    Coronary artery calcification seen on CT scan 03/01/2020   Cough 01/12/2020   COVID-19 03/10/2020   Dyspnea    on exertion,after getting covid   Family history of adverse reaction to anesthesia    brothr had n/v   GERD (gastroesophageal reflux disease)    Hiatal hernia    Hyperlipemia    Pneumonia    Pre-operative cardiovascular examination 03/01/2020   Right lower lobe lung mass 01/28/2020   Stenosis of left subclavian artery 01/28/2020   Visit for screening mammogram 01/28/2020    Current Outpatient Medications  Medication Sig Dispense Refill   albuterol  (VENTOLIN HFA) 108 (90 Base) MCG/ACT inhaler Inhale into the lungs.     Ascorbic Acid (VITAMIN C PO) Take 3 tablets by mouth daily.     azithromycin (ZITHROMAX) 250 MG tablet Take 2 tablets on day 1, then 1 tablet daily on days 2 through 5 6 tablet 0   benzonatate (TESSALON) 200 MG capsule Take 1 capsule (200 mg total) by mouth 3 (three) times daily as needed for cough. 30 capsule 0   cetirizine (ZYRTEC) 10 MG tablet Take by mouth.     Coenzyme Q10 (CO Q-10 PO) Take 2 tablets by mouth daily.     cyclobenzaprine (FLEXERIL) 5 MG tablet Take 1 tablet (5 mg total) by mouth 3 (three) times daily as needed for muscle spasms. 30 tablet 1   Fluticasone-Umeclidin-Vilant (TRELEGY ELLIPTA) 100-62.5-25 MCG/ACT AEPB Inhale 1 puff into the lungs daily. 2 each 0   montelukast (SINGULAIR) 10 MG tablet TAKE 1 TABLET AT BEDTIME 90 tablet 3   Multiple Vitamin (MULTIVITAMIN ADULT PO) Take 2 tablets by mouth daily.     Multiple Vitamins-Minerals (VITAMIN D3 COMPLETE PO) Take by mouth.     mupirocin ointment (BACTROBAN) 2 % Apply 1 application topically 2 (two) times daily. 22 g 2   omeprazole (PRILOSEC) 20 MG capsule TAKE 1 CAPSULE TWICE DAILY BEFORE MEALS 180 capsule 3   ondansetron (ZOFRAN) 4 MG tablet Take 1 tablet (4 mg total) by mouth every  8 (eight) hours as needed for nausea or vomiting. 30 tablet 0   predniSONE (DELTASONE) 20 MG tablet Take 1 tablet (20 mg total) by mouth 2 (two) times daily with a meal. 10 tablet 0   rosuvastatin (CRESTOR) 5 MG tablet TAKE 1 TABLET EVERY DAY 90 tablet 3   VITAMIN D PO Take 2 tablets by mouth daily.     No current facility-administered medications for this visit.    Physical Exam BP 119/77   Pulse 96   Resp 20   Wt 157 lb (71.2 kg)   SpO2 96%   BMI 23.65 kg/m  72 year old woman in no acute distress Anxious Alert and oriented x 3 no focal deficits Cardiac regular rate and rhythm Wheezing left upper No cervical or supraclavicular adenopathy  Diagnostic  Tests: CT CHEST WITHOUT CONTRAST   TECHNIQUE: Multidetector CT imaging of the chest was performed following the standard protocol without IV contrast.   RADIATION DOSE REDUCTION: This exam was performed according to the departmental dose-optimization program which includes automated exposure control, adjustment of the mA and/or kV according to patient size and/or use of iterative reconstruction technique.   COMPARISON:  Multiple prior CT scans.  The most recent is 10/25/2021   FINDINGS: Cardiovascular: The heart is normal in size. No pericardial effusion. The aorta is stable. Advanced atherosclerotic calcifications but no aneurysm. Stable three-vessel coronary artery calcifications.   Mediastinum/Nodes: Definite interval increase in size of mediastinal nodes. Anterior mediastinal node on image 37/2 measures 6 mm and previously measured 3 mm. More caudal no on image 39/2 measures 7 mm and previously measured 3 mm. Right paratracheal node on image 54/2 measures 8 mm and previously measured 3 mm. Precarinal lymph node measures 9 mm on image 59/2 and previously measured 9 mm. Right-sided subcarinal node on image number 72/2 measures 12 mm and previously measured 7 mm. Left-sided node measures 11 mm and previously measured 5 mm. Findings worrisome for metastatic adenopathy. PET-CT suggested for further evaluation.   The esophagus is grossly normal. The thyroid gland is unremarkable. No supraclavicular or axillary adenopathy.   Lungs/Pleura: Stable surgical changes from right middle lobe lobectomy. Stable underlying emphysematous changes and pulmonary scarring. No new pulmonary lesions or new pulmonary nodules to suggest pulmonary metastatic disease. No pleural effusions or pleural nodules. Stable apical pleural and parenchymal scarring with subpleural density in the right apex anteriorly without interval change. Stable 2.9 cm right-sided epicardial cyst.   Upper Abdomen: No  significant upper abdominal findings. Stable diffuse fatty infiltration of the liver. No hepatic or adrenal gland lesions are identified. Stable aortic calcifications.   Musculoskeletal: No breast masses, supraclavicular or axillary adenopathy. The bony thorax is intact.   IMPRESSION: 1. Stable surgical changes from right middle lobe lobectomy. No findings suspicious for recurrent pulmonary neoplasm or pulmonary metastatic disease. 2. Definite interval increase in size of mediastinal nodes worrisome for metastatic adenopathy. PET-CT suggested for further evaluation. 3. No findings for upper abdominal metastatic disease or osseous metastatic disease.   Aortic Atherosclerosis (ICD10-I70.0) and Emphysema (ICD10-J43.9).     Electronically Signed   By: Rudie Meyer M.D.   On: 06/06/2022 14:38 I personally reviewed the CT images.  There is an increase in the size of the subcarinal nodes.  Impression: [name] is a 72 year old woman with a history of tobacco abuse, COPD, stage Ib adenocarcinoma of the lung, right middle lobectomy, aortic atherosclerosis, heart murmur, coronary calcification, hiatal hernia, reflux, hyperlipidemia, subclavian stenosis, and pneumonia.  She returns today for  a scheduled 2-year follow-up.  His CT shows an increase in size of some mediastinal nodes particularly the subcarinal nodes.  A confounding factor is her acute respiratory tract infection.  There is no sign of pneumonia.  It is possible that her adenopathy is related to that.  Unfortunately she did not notify us of that or we would have delayed her CT for a few weeks.  More likely though is that her adenopathy is sign of recurrence.  I recommended that we follow her up in 1 month with a PET/CT.  That should give a chance for any acute respiratory effects to be gone and then give [name] a definitive answer as to whether those nodes are pathologic.   Plan: Return in 1 month with PET/CT to reevaluate subcarinal  adenopathy.  I spent over 20 minutes in review of records, images, and in consultation with [name] today. [name], MD [facility] [phone]

## 2022-06-07 ENCOUNTER — Other Ambulatory Visit: Payer: Self-pay | Admitting: Thoracic Surgery (Cardiothoracic Vascular Surgery)

## 2022-06-07 DIAGNOSIS — C3490 Malignant neoplasm of unspecified part of unspecified bronchus or lung: Secondary | ICD-10-CM

## 2022-06-08 ENCOUNTER — Telehealth: Payer: Self-pay

## 2022-06-08 NOTE — Telephone Encounter (Signed)
Patient called and stated she is taking prednisone but it causing her nt to be able to sleep, wanted to know if she can cut the pill in half and take half in the morning and half at lunch because she is unable to sleep at night and she can just continue the medication until it is done. Please advise.

## 2022-06-09 NOTE — Telephone Encounter (Signed)
Patient made aware, stated that she is going to try to finish it out the way is wrote, she did good yesterday and got sleep. And if she don't sleep tonight she will do the half of tablets.

## 2022-06-13 ENCOUNTER — Ambulatory Visit: Payer: Medicare HMO | Admitting: Physician Assistant

## 2022-07-03 ENCOUNTER — Encounter (HOSPITAL_COMMUNITY)
Admission: RE | Admit: 2022-07-03 | Discharge: 2022-07-03 | Disposition: A | Discharge status: Home / Self Care | Payer: Medicare HMO | Source: Ambulatory Visit | Attending: Thoracic Surgery (Cardiothoracic Vascular Surgery) | Admitting: Thoracic Surgery (Cardiothoracic Vascular Surgery)

## 2022-07-03 DIAGNOSIS — C3411 Malignant neoplasm of upper lobe, right bronchus or lung: Secondary | ICD-10-CM | POA: Diagnosis not present

## 2022-07-03 DIAGNOSIS — C3490 Malignant neoplasm of unspecified part of unspecified bronchus or lung: Secondary | ICD-10-CM

## 2022-07-03 LAB — GLUCOSE, CAPILLARY: Glucose-Capillary: 109 mg/dL — ABNORMAL HIGH (ref 70–99)

## 2022-07-03 MED ORDER — FLUDEOXYGLUCOSE F - 18 (FDG) INJECTION
7.8000 | Freq: Once | INTRAVENOUS | Status: AC
  Administered 2022-07-03: 7.8 via INTRAVENOUS

## 2022-07-11 ENCOUNTER — Encounter: Payer: Self-pay | Admitting: *Deleted

## 2022-07-11 ENCOUNTER — Other Ambulatory Visit: Payer: Self-pay | Admitting: *Deleted

## 2022-07-11 ENCOUNTER — Encounter: Payer: Self-pay | Admitting: Thoracic Surgery (Cardiothoracic Vascular Surgery)

## 2022-07-11 ENCOUNTER — Ambulatory Visit: Payer: Medicare HMO | Admitting: Thoracic Surgery (Cardiothoracic Vascular Surgery)

## 2022-07-11 VITALS — BP 129/81 | HR 82 | Resp 20 | Ht 68.0 in | Wt 163.0 lb

## 2022-07-11 DIAGNOSIS — R590 Localized enlarged lymph nodes: Secondary | ICD-10-CM

## 2022-07-11 DIAGNOSIS — Z9889 Other specified postprocedural states: Secondary | ICD-10-CM

## 2022-07-11 DIAGNOSIS — C3491 Malignant neoplasm of unspecified part of right bronchus or lung: Secondary | ICD-10-CM | POA: Diagnosis not present

## 2022-07-11 NOTE — Progress Notes (Signed)
[address]       [name] [address]             [phone]     HPI: [name] returns to discuss the results of her PET/CT.  [name] is a 72 year old woman with a history of tobacco abuse, COPD, stage Ib adenocarcinoma of the lung, right middle lobectomy, aortic atherosclerosis, heart murmur, coronary calcification, hiatal hernia, reflux, hyperlipidemia, subclavian stenosis, and pneumonia.  She had a right middle lobectomy by Dr. Tyrone Sage in 2022.  She had a stage Ib adenocarcinoma.  As she did not see an oncologist and was followed by Dr. Tyrone Sage.  I saw her in the office on 06/06/2022.  She came in for a routine follow-up with a CT which showed some new mediastinal adenopathy.  She had a respiratory infection at the time so we recommended a PET/CT a month for follow-up.  In the interim since her last visit her respiratory infection has resolved.  She still notices some mucus in her throat when she first wakes up in the morning.  Appetite has improved since her illness resolved.  Past Medical History:  Diagnosis Date   Abnormal chest x-ray 01/19/2020   Acute laryngopharyngitis 03/18/2020   Aortic atherosclerosis (HCC) 01/28/2020   Arthritis    Cardiac murmur 03/01/2020   COPD (chronic obstructive pulmonary disease) (HCC)    Coronary artery calcification seen on CT scan 03/01/2020   Cough 01/12/2020   COVID-19 03/10/2020   Dyspnea    on exertion,after getting covid   Family history of adverse reaction to anesthesia    brothr had n/v   GERD (gastroesophageal reflux disease)    Hiatal hernia    Hyperlipemia    Pneumonia    Pre-operative cardiovascular examination 03/01/2020   Right lower lobe lung mass 01/28/2020   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   Visit for screening mammogram 01/28/2020    Current Outpatient Medications  Medication Sig Dispense Refill   Ascorbic Acid (VITAMIN C PO) Take 3 tablets by mouth daily.     benzonatate  (TESSALON) 200 MG capsule Take 1 capsule (200 mg total) by mouth 3 (three) times daily as needed for cough. 30 capsule 0   cetirizine (ZYRTEC) 10 MG tablet Take by mouth.     Coenzyme Q10 (CO Q-10 PO) Take 2 tablets by mouth daily.     cyclobenzaprine (FLEXERIL) 5 MG tablet Take 1 tablet (5 mg total) by mouth 3 (three) times daily as needed for muscle spasms. 30 tablet 1   Fluticasone-Umeclidin-Vilant (TRELEGY ELLIPTA) 100-62.5-25 MCG/ACT AEPB Inhale 1 puff into the lungs daily. 2 each 0   montelukast (SINGULAIR) 10 MG tablet TAKE 1 TABLET AT BEDTIME 90 tablet 3   Multiple Vitamin (MULTIVITAMIN ADULT PO) Take 2 tablets by mouth daily.     Multiple Vitamins-Minerals (VITAMIN D3 COMPLETE PO) Take by mouth.     mupirocin ointment (BACTROBAN) 2 % Apply 1 application topically 2 (two) times daily. 22 g 2   omeprazole (PRILOSEC) 20 MG capsule TAKE 1 CAPSULE TWICE DAILY BEFORE MEALS 180 capsule 3   ondansetron (ZOFRAN) 4 MG tablet Take 1 tablet (4 mg total) by mouth every 8 (eight) hours as needed for nausea or vomiting. 30 tablet 0   rosuvastatin (CRESTOR) 5 MG tablet TAKE 1 TABLET EVERY DAY 90 tablet 3   VITAMIN D PO Take 2 tablets by mouth daily.     No current facility-administered medications for this visit.  Physical Exam BP 129/81 (BP Location: Left Arm, Patient Position: Sitting, Cuff Size: Normal)   Pulse 82   Resp 20   Ht 5\' 8"  (1.727 m)   Wt 163 lb (73.9 kg)   SpO2 97% Comment: RA  BMI 24.21 kg/m  72 year old woman in no acute distress Alert and oriented x 3 with no focal deficits No cervical or supraclavicular adenopathy Cardiac regular rate and rhythm with a 2/6 systolic murmur Lungs clear bilaterally  Diagnostic Tests: NUCLEAR MEDICINE PET SKULL BASE TO THIGH   TECHNIQUE: 7.78 mCi F-18 FDG was injected intravenously. Full-ring PET imaging was performed from the skull base to thigh after the radiotracer. CT data was obtained and used for attenuation correction and  anatomic localization.   Fasting blood glucose: 109 mg/dl   COMPARISON:  Multiple previous imaging studies. Remote PET-CT from 2021 and recent chest CT 06/06/2022   FINDINGS: Mediastinal blood pool activity: SUV max 2.13   Liver activity: SUV max NA   NECK: No hypermetabolic lymph nodes in the neck.   Incidental CT findings: None.   CHEST: Stable surgical changes from right middle lobe lobectomy. No areas of hypermetabolism suggest locally recurrent disease. No new hypermetabolic pulmonary lesions or pulmonary nodules.   The enlarged mediastinal and hilar lymph nodes seen on the recent chest CT demonstrate hypermetabolism and consistent with metastatic adenopathy. Pretracheal lymph node has an SUV max of 8.0. Right paratracheal node has an SUV max of 8.18. Right hilar node has an SUV max of 7.59. Subcarinal adenopathy has an SUV max of 11.63. No supraclavicular or axillary adenopathy. No hypermetabolic breast masses.   Incidental CT findings: Stable underlying emphysematous changes and pulmonary scarring. Stable aortic and coronary artery calcifications.   ABDOMEN/PELVIS: No abnormal hypermetabolic activity within the liver, pancreas, adrenal glands, or spleen. No hypermetabolic lymph nodes in the abdomen or pelvis.   Incidental CT findings: Diffuse fatty infiltration of the liver. Advanced atherosclerotic calcification involving the aorta and branch vessels but no aneurysm. High attenuation material in the gallbladder could be sludge or gallstones or both. Pessary ring noted in the vagina.   SKELETON: No findings suspicious for osseous metastatic disease. Moderate hypermetabolism noted in the proximal right hamstring tendons likely significant tendinopathy or partial tear.   Incidental CT findings: None.   IMPRESSION: 1. Hypermetabolic mediastinal and right hilar lymphadenopathy consistent with metastatic disease. 2. Stable surgical changes from right middle lobe  lobectomy. No findings for locally recurrent disease or pulmonary metastatic disease. 3. No findings for metastatic disease involving the abdomen/pelvis or bony structures. 4. Diffuse fatty infiltration of the liver.   Aortic Atherosclerosis (ICD10-I70.0).     Electronically Signed   By: Rudie Meyer M.D.   On: 07/06/2022 10:17 I personally reviewed the PET/CT images.  Not much change in size of the nodes compared to her CT from a month prior but the nodes are hypermetabolic particularly right hilar and subcarinal but also some small right paratracheal nodes.  No evidence of distant metastatic disease.  Impression: [name] is a 72 year old woman with a history of tobacco abuse, COPD, stage Ib adenocarcinoma of the lung, right middle lobectomy, aortic atherosclerosis, heart murmur, coronary calcification, hiatal hernia, reflux, hyperlipidemia, subclavian stenosis, and pneumonia.   Stage Ib adenocarcinoma right middle lobe-status post robotic right middle lobectomy in 2022.  Now has hilar and mediastinal adenopathy is hypermetabolic on PET/CT.  Findings are consistent with recurrent lung cancer.  Infectious and inflammatory etiologies are far less likely.  I recommended to  her that we proceed with a bronchoscopy and endobronchial ultrasound for diagnosis and staging.  I described the proposed procedure to her.  She understands this is an endoscopic procedure that will be done on an outpatient basis.  I informed her of the indications, risks, benefits, and alternatives.  She understands the risk include death, MI, DVT, PE, bleeding, pneumothorax, as well as possibility of other unforeseeable complications.  She accepts the risks and agrees to proceed  Plan: Bronchoscopy and endobronchial ultrasound on Thursday, 07/20/2022  [name], MD [facility] [phone]

## 2022-07-11 NOTE — H&P (View-Only) (Signed)
    [address]       [place] [address]             [phone]     HPI: [name] returns to discuss the results of her PET/CT.  [name] is a 71-year-old woman with a history of tobacco abuse, COPD, stage Ib adenocarcinoma of the lung, right middle lobectomy, aortic atherosclerosis, heart murmur, coronary calcification, hiatal hernia, reflux, hyperlipidemia, subclavian stenosis, and pneumonia.  She had a right middle lobectomy by Dr. Gerhardt in 2022.  She had a stage Ib adenocarcinoma.  As she did not see an oncologist and was followed by Dr. Gerhardt.  I saw her in the office on 06/06/2022.  She came in for a routine follow-up with a CT which showed some new mediastinal adenopathy.  She had a respiratory infection at the time so we recommended a PET/CT a month for follow-up.  In the interim since her last visit her respiratory infection has resolved.  She still notices some mucus in her throat when she first wakes up in the morning.  Appetite has improved since her illness resolved.  Past Medical History:  Diagnosis Date   Abnormal chest x-ray 01/19/2020   Acute laryngopharyngitis 03/18/2020   Aortic atherosclerosis (HCC) 01/28/2020   Arthritis    Cardiac murmur 03/01/2020   COPD (chronic obstructive pulmonary disease) (HCC)    Coronary artery calcification seen on CT scan 03/01/2020   Cough 01/12/2020   COVID-19 03/10/2020   Dyspnea    on exertion,after getting covid   Family history of adverse reaction to anesthesia    brothr had n/v   GERD (gastroesophageal reflux disease)    Hiatal hernia    Hyperlipemia    Pneumonia    Pre-operative cardiovascular examination 03/01/2020   Right lower lobe lung mass 01/28/2020   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   Visit for screening mammogram 01/28/2020    Current Outpatient Medications  Medication Sig Dispense Refill   Ascorbic Acid (VITAMIN C PO) Take 3 tablets by mouth daily.     benzonatate  (TESSALON) 200 MG capsule Take 1 capsule (200 mg total) by mouth 3 (three) times daily as needed for cough. 30 capsule 0   cetirizine (ZYRTEC) 10 MG tablet Take by mouth.     Coenzyme Q10 (CO Q-10 PO) Take 2 tablets by mouth daily.     cyclobenzaprine (FLEXERIL) 5 MG tablet Take 1 tablet (5 mg total) by mouth 3 (three) times daily as needed for muscle spasms. 30 tablet 1   Fluticasone-Umeclidin-Vilant (TRELEGY ELLIPTA) 100-62.5-25 MCG/ACT AEPB Inhale 1 puff into the lungs daily. 2 each 0   montelukast (SINGULAIR) 10 MG tablet TAKE 1 TABLET AT BEDTIME 90 tablet 3   Multiple Vitamin (MULTIVITAMIN ADULT PO) Take 2 tablets by mouth daily.     Multiple Vitamins-Minerals (VITAMIN D3 COMPLETE PO) Take by mouth.     mupirocin ointment (BACTROBAN) 2 % Apply 1 application topically 2 (two) times daily. 22 g 2   omeprazole (PRILOSEC) 20 MG capsule TAKE 1 CAPSULE TWICE DAILY BEFORE MEALS 180 capsule 3   ondansetron (ZOFRAN) 4 MG tablet Take 1 tablet (4 mg total) by mouth every 8 (eight) hours as needed for nausea or vomiting. 30 tablet 0   rosuvastatin (CRESTOR) 5 MG tablet TAKE 1 TABLET EVERY DAY 90 tablet 3   VITAMIN D PO Take 2 tablets by mouth daily.     No current facility-administered medications for this visit.      Physical Exam BP 129/81 (BP Location: Left Arm, Patient Position: Sitting, Cuff Size: Normal)   Pulse 82   Resp 20   Ht 5' 8" (1.727 m)   Wt 163 lb (73.9 kg)   SpO2 97% Comment: RA  BMI 24.78 kg/m  71-year-old woman in no acute distress Alert and oriented x 3 with no focal deficits No cervical or supraclavicular adenopathy Cardiac regular rate and rhythm with a 2/6 systolic murmur Lungs clear bilaterally  Diagnostic Tests: NUCLEAR MEDICINE PET SKULL BASE TO THIGH   TECHNIQUE: 7.78 mCi F-18 FDG was injected intravenously. Full-ring PET imaging was performed from the skull base to thigh after the radiotracer. CT data was obtained and used for attenuation correction and  anatomic localization.   Fasting blood glucose: 109 mg/dl   COMPARISON:  Multiple previous imaging studies. Remote PET-CT from 2021 and recent chest CT 06/06/2022   FINDINGS: Mediastinal blood pool activity: SUV max 2.13   Liver activity: SUV max NA   NECK: No hypermetabolic lymph nodes in the neck.   Incidental CT findings: None.   CHEST: Stable surgical changes from right middle lobe lobectomy. No areas of hypermetabolism suggest locally recurrent disease. No new hypermetabolic pulmonary lesions or pulmonary nodules.   The enlarged mediastinal and hilar lymph nodes seen on the recent chest CT demonstrate hypermetabolism and consistent with metastatic adenopathy. Pretracheal lymph node has an SUV max of 8.0. Right paratracheal node has an SUV max of 8.18. Right hilar node has an SUV max of 7.59. Subcarinal adenopathy has an SUV max of 11.63. No supraclavicular or axillary adenopathy. No hypermetabolic breast masses.   Incidental CT findings: Stable underlying emphysematous changes and pulmonary scarring. Stable aortic and coronary artery calcifications.   ABDOMEN/PELVIS: No abnormal hypermetabolic activity within the liver, pancreas, adrenal glands, or spleen. No hypermetabolic lymph nodes in the abdomen or pelvis.   Incidental CT findings: Diffuse fatty infiltration of the liver. Advanced atherosclerotic calcification involving the aorta and branch vessels but no aneurysm. High attenuation material in the gallbladder could be sludge or gallstones or both. Pessary ring noted in the vagina.   SKELETON: No findings suspicious for osseous metastatic disease. Moderate hypermetabolism noted in the proximal right hamstring tendons likely significant tendinopathy or partial tear.   Incidental CT findings: None.   IMPRESSION: 1. Hypermetabolic mediastinal and right hilar lymphadenopathy consistent with metastatic disease. 2. Stable surgical changes from right middle lobe  lobectomy. No findings for locally recurrent disease or pulmonary metastatic disease. 3. No findings for metastatic disease involving the abdomen/pelvis or bony structures. 4. Diffuse fatty infiltration of the liver.   Aortic Atherosclerosis (ICD10-I70.0).     Electronically Signed   By: P.  Gallerani M.D.   On: 07/06/2022 10:17 I personally reviewed the PET/CT images.  Not much change in size of the nodes compared to her CT from a month prior but the nodes are hypermetabolic particularly right hilar and subcarinal but also some small right paratracheal nodes.  No evidence of distant metastatic disease.  Impression: [name] is a 71-year-old woman with a history of tobacco abuse, COPD, stage Ib adenocarcinoma of the lung, right middle lobectomy, aortic atherosclerosis, heart murmur, coronary calcification, hiatal hernia, reflux, hyperlipidemia, subclavian stenosis, and pneumonia.   Stage Ib adenocarcinoma right middle lobe-status post robotic right middle lobectomy in 2022.  Now has hilar and mediastinal adenopathy is hypermetabolic on PET/CT.  Findings are consistent with recurrent lung cancer.  Infectious and inflammatory etiologies are far less likely.  I recommended to   her that we proceed with a bronchoscopy and endobronchial ultrasound for diagnosis and staging.  I described the proposed procedure to her.  She understands this is an endoscopic procedure that will be done on an outpatient basis.  I informed her of the indications, risks, benefits, and alternatives.  She understands the risk include death, MI, DVT, PE, bleeding, pneumothorax, as well as possibility of other unforeseeable complications.  She accepts the risks and agrees to proceed  Plan: Bronchoscopy and endobronchial ultrasound on Thursday, 07/20/2022  Renee Cristo C Kenon Delashmit, MD [facility] [phone]     

## 2022-07-14 NOTE — Pre-Procedure Instructions (Signed)
Surgical Instructions    Your procedure is scheduled on Thursday, May 30th.  Report to [facility] Main Entrance "A" at 06:00 A.M., then check in with the Admitting office.  Call this number if you have problems the morning of surgery:  [phone]  If you have any questions prior to your surgery date call [phone]: Open Monday-Friday 8am-4pm If you experience any cold or flu symptoms such as cough, fever, chills, shortness of breath, etc. between now and your scheduled surgery, please notify us at the above number.     Remember:  Do not eat or drink after midnight the night before your surgery     Take these medicines the morning of surgery with A SIP OF WATER  cetirizine (ZYRTEC)  Fluticasone-Umeclidin-Vilant (TRELEGY ELLIPTA)  omeprazole (PRILOSEC)    If needed: acetaminophen (TYLENOL)  benzonatate (TESSALON)    As of today, STOP taking any Aspirin (unless otherwise instructed by your surgeon) Aleve, Naproxen, Ibuprofen, Motrin, Advil, Goody's, BC's, all herbal medications, fish oil, and all vitamins.                     Do NOT Smoke (Tobacco/Vaping) for 24 hours prior to your procedure.  If you use a CPAP at night, you may bring your mask/headgear for your overnight stay.   Contacts, glasses, piercing's, hearing aid's, dentures or partials may not be worn into surgery, please bring cases for these belongings.    For patients admitted to the hospital, discharge time will be determined by your treatment team.   Patients discharged the day of surgery will not be allowed to drive home, and someone needs to stay with them for 24 hours.  SURGICAL WAITING ROOM VISITATION Patients having surgery or a procedure may have no more than 2 support people in the waiting area - these visitors may rotate.   Children under the age of 84 must have an adult with them who is not the patient. If the patient needs to stay at the hospital during part of their recovery, the visitor  guidelines for inpatient rooms apply. Pre-op nurse will coordinate an appropriate time for 1 support person to accompany patient in pre-op.  This support person may not rotate.   Please refer to the [facility] website for the visitor guidelines for Inpatients (after your surgery is over and you are in a regular room).    Special instructions:   - Preparing For Surgery  Before surgery, you can play an important role. Because skin is not sterile, your skin needs to be as free of germs as possible. You can reduce the number of germs on your skin by washing with CHG (chlorahexidine gluconate) Soap before surgery.  CHG is an antiseptic cleaner which kills germs and bonds with the skin to continue killing germs even after washing.    Oral Hygiene is also important to reduce your risk of infection.  Remember - BRUSH YOUR TEETH THE MORNING OF SURGERY WITH YOUR REGULAR TOOTHPASTE  Please do not use if you have an allergy to CHG or antibacterial soaps. If your skin becomes reddened/irritated stop using the CHG.  Do not shave (including legs and underarms) for at least 48 hours prior to first CHG shower. It is OK to shave your face.  Please follow these instructions carefully.   Shower the NIGHT BEFORE SURGERY and the MORNING OF SURGERY  If you chose to wash your hair, wash your hair first as usual with your normal shampoo.  After you  shampoo, rinse your hair and body thoroughly to remove the shampoo.  Use CHG Soap as you would any other liquid soap. You can apply CHG directly to the skin and wash gently with a scrungie or a clean washcloth.   Apply the CHG Soap to your body ONLY FROM THE NECK DOWN.  Do not use on open wounds or open sores. Avoid contact with your eyes, ears, mouth and genitals (private parts). Wash Face and genitals (private parts)  with your normal soap.   Wash thoroughly, paying special attention to the area where your surgery will be performed.  Thoroughly rinse  your body with warm water from the neck down.  DO NOT shower/wash with your normal soap after using and rinsing off the CHG Soap.  Pat yourself dry with a CLEAN TOWEL.  Wear CLEAN PAJAMAS to bed the night before surgery  Place CLEAN SHEETS on your bed the night before your surgery  DO NOT SLEEP WITH PETS.   Day of Surgery: Take a shower with CHG soap. Do not wear jewelry or makeup Do not wear lotions, powders, perfumes, or deodorant. Do not shave 48 hours prior to surgery.   Do not bring valuables to the hospital. [facility] is not responsible for any belongings or valuables. Do not wear nail polish, gel polish, artificial nails, or any other type of covering on natural nails (fingers and toes) If you have artificial nails or gel coating that need to be removed by a nail salon, please have this removed prior to surgery. Artificial nails or gel coating may interfere with anesthesia's ability to adequately monitor your vital signs. Wear Clean/Comfortable clothing the morning of surgery Remember to brush your teeth WITH YOUR REGULAR TOOTHPASTE.   Please read over the following fact sheets that you were given.    If you received a COVID test during your pre-op visit  it is requested that you wear a mask when out in public, stay away from anyone that may not be feeling well and notify your surgeon if you develop symptoms. If you have been in contact with anyone that has tested positive in the last 10 days please notify you surgeon.

## 2022-07-18 ENCOUNTER — Encounter (HOSPITAL_COMMUNITY)
Admission: RE | Admit: 2022-07-18 | Discharge: 2022-07-18 | Disposition: A | Discharge status: Home / Self Care | Payer: Medicare HMO | Source: Ambulatory Visit | Attending: Thoracic Surgery (Cardiothoracic Vascular Surgery) | Admitting: Thoracic Surgery (Cardiothoracic Vascular Surgery)

## 2022-07-18 ENCOUNTER — Encounter (HOSPITAL_COMMUNITY): Payer: Self-pay

## 2022-07-18 ENCOUNTER — Other Ambulatory Visit: Payer: Self-pay

## 2022-07-18 ENCOUNTER — Ambulatory Visit (HOSPITAL_COMMUNITY)
Admission: RE | Admit: 2022-07-18 | Discharge: 2022-07-18 | Disposition: A | Discharge status: Home / Self Care | Payer: Medicare HMO | Source: Ambulatory Visit | Attending: Thoracic Surgery (Cardiothoracic Vascular Surgery) | Admitting: Thoracic Surgery (Cardiothoracic Vascular Surgery)

## 2022-07-18 VITALS — BP 126/57 | HR 83 | Temp 97.7°F | Resp 18 | Ht 67.5 in | Wt 164.0 lb

## 2022-07-18 DIAGNOSIS — Z85118 Personal history of other malignant neoplasm of bronchus and lung: Secondary | ICD-10-CM | POA: Diagnosis not present

## 2022-07-18 DIAGNOSIS — Z01818 Encounter for other preprocedural examination: Secondary | ICD-10-CM | POA: Insufficient documentation

## 2022-07-18 DIAGNOSIS — Z87891 Personal history of nicotine dependence: Secondary | ICD-10-CM | POA: Insufficient documentation

## 2022-07-18 DIAGNOSIS — I2510 Atherosclerotic heart disease of native coronary artery without angina pectoris: Secondary | ICD-10-CM | POA: Diagnosis not present

## 2022-07-18 DIAGNOSIS — R590 Localized enlarged lymph nodes: Secondary | ICD-10-CM | POA: Insufficient documentation

## 2022-07-18 DIAGNOSIS — Z1152 Encounter for screening for COVID-19: Secondary | ICD-10-CM | POA: Insufficient documentation

## 2022-07-18 DIAGNOSIS — R599 Enlarged lymph nodes, unspecified: Secondary | ICD-10-CM | POA: Insufficient documentation

## 2022-07-18 DIAGNOSIS — J449 Chronic obstructive pulmonary disease, unspecified: Secondary | ICD-10-CM | POA: Insufficient documentation

## 2022-07-18 HISTORY — DX: Malignant neoplasm of middle lobe, bronchus or lung: C34.2

## 2022-07-18 HISTORY — DX: Nausea with vomiting, unspecified: R11.2

## 2022-07-18 HISTORY — DX: Other specified postprocedural states: Z98.890

## 2022-07-18 HISTORY — DX: Other specified postprocedural states: R11.2

## 2022-07-18 LAB — CBC
HCT: 40.1 % (ref 36.0–46.0)
Hemoglobin: 12.3 g/dL (ref 12.0–15.0)
MCH: 25.6 pg — ABNORMAL LOW (ref 26.0–34.0)
MCHC: 30.7 g/dL (ref 30.0–36.0)
MCV: 83.4 fL (ref 80.0–100.0)
Platelets: 296 10*3/uL (ref 150–400)
RBC: 4.81 MIL/uL (ref 3.87–5.11)
RDW: 15.9 % — ABNORMAL HIGH (ref 11.5–15.5)
WBC: 9.7 10*3/uL (ref 4.0–10.5)
nRBC: 0 % (ref 0.0–0.2)

## 2022-07-18 LAB — COMPREHENSIVE METABOLIC PANEL
ALT: 35 U/L (ref 0–44)
AST: 32 U/L (ref 15–41)
Albumin: 3.7 g/dL (ref 3.5–5.0)
Alkaline Phosphatase: 57 U/L (ref 38–126)
Anion gap: 6 (ref 5–15)
BUN: 11 mg/dL (ref 8–23)
CO2: 26 mmol/L (ref 22–32)
Calcium: 8.6 mg/dL — ABNORMAL LOW (ref 8.9–10.3)
Chloride: 102 mmol/L (ref 98–111)
Creatinine, Ser: 0.99 mg/dL (ref 0.44–1.00)
GFR, Estimated: >60 mL/min (ref >60)
Glucose, Bld: 124 mg/dL — ABNORMAL HIGH (ref 70–99)
Potassium: 3.7 mmol/L (ref 3.5–5.1)
Sodium: 134 mmol/L — ABNORMAL LOW (ref 135–145)
Total Bilirubin: 0.6 mg/dL (ref 0.3–1.2)
Total Protein: 7 g/dL (ref 6.5–8.1)

## 2022-07-18 LAB — PROTIME-INR
INR: 0.9 (ref 0.8–1.2)
Prothrombin Time: 12.7 seconds (ref 11.4–15.2)

## 2022-07-18 LAB — APTT: aPTT: 29 seconds (ref 24–36)

## 2022-07-18 NOTE — Progress Notes (Signed)
PCP - [name], PA-C Cardiologist - denies (Pt did see Dr. Tomie China in 2022 r/t "multivessel coronary calcification," but appears to have been cleared from a cardiology standpoint)  PPM/ICD - denies   Chest x-ray - 07/18/22 EKG - 07/18/22 Stress Test - 03/04/20 ECHO - 03/03/20 Cardiac Cath - denies  Sleep Study - denies   DM- denies  ASA/Blood Thinner Instructions: n/a   ERAS Protcol - no, NPO   COVID TEST- 07/18/22   Anesthesia review: yes, pt saw cardiology in 2022. Appears that she was cleared by cardiology for that procedure. Note from Fayrene Fearing 03/25/20  Patient denies shortness of breath, fever, cough and chest pain at PAT appointment   All instructions explained to the patient, with a verbal understanding of the material. Patient agrees to go over the instructions while at home for a better understanding. Patient also instructed to wear a mask in public after being tested for COVID-19. The opportunity to ask questions was provided.

## 2022-07-19 LAB — SARS CORONAVIRUS 2 (TAT 6-24 HRS): SARS Coronavirus 2: NEGATIVE

## 2022-07-19 NOTE — Progress Notes (Signed)
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  She was referred to cardiologist Dr. Tomie China for preoperative risk stratification prior to undergoing RML lobectomy in February 2022.  Per Dr. Kem Parkinson note 03/01/2020, "In view of multivessel calcification and multiple risk factors we will do a Lexiscan sestamibi.  If this is negative then she is not at high risk for coronary events during the aforementioned surgery.  Meticulous hemodynamic monitoring will further reduce the risk of coronary events." Nuclear stress 03/04/2020 was low risk: Echo 03/03/2020 showed EF 60 to 65%, grade 1 DD, mild MR, mild TR.   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  Recent PET/CT shows hypermetabolic hilar and mediastinal adenopathy consistent with recurrent lung cancer.  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   Preop labs reviewed, unremarkable.   EKG 07/10/2022: NSR.  Rate 73.  CT chest 06/06/2022: IMPRESSION: 1. Stable surgical changes from right middle lobe lobectomy. No findings suspicious for recurrent pulmonary neoplasm or pulmonary metastatic disease. 2. Definite interval increase in size of mediastinal nodes worrisome for metastatic adenopathy. PET-CT suggested for further evaluation. 3. No findings for upper abdominal metastatic disease or osseous metastatic disease.   PFTs 02/25/2020: FVC-%Pred-Pre Latest Units: % 89  FEV1-%Pred-Pre Latest Units: % 77  FEV1FVC-%Pred-Pre Latest Units: % 85  TLC % pred Latest Units: % 121  RV % pred Latest Units: % 165  DLCO unc % pred Latest Units: % 81    Nuclear stress 03/04/2020: The left ventricular ejection fraction is hyperdynamic (>65%). Nuclear stress EF: 76%. There was no ST segment deviation noted during stress. The study is normal. This is a low risk study.     TTE 03/03/2020:  1. Left ventricular ejection  fraction, by estimation, is 60 to 65%. The  left ventricle has normal function. The left ventricle has no regional  wall motion abnormalities. Left ventricular diastolic parameters are  consistent with Grade I diastolic  dysfunction (impaired relaxation).   2. Right ventricular systolic function is normal. The right ventricular  size is normal. There is normal pulmonary artery systolic pressure.   3. The mitral valve is normal in structure. Mild mitral valve  regurgitation. No evidence of mitral stenosis.   4. The aortic valve is normal in structure. Aortic valve regurgitation is  not visualized. No aortic stenosis is present.   5. The inferior vena cava is normal in size with greater than 50%  respiratory variability, suggesting right atrial pressure of 3 mmHg.    Zannie Cove [facility] Short Stay Center/Anesthesiology Phone [phone] 07/19/2022 9:05 AM

## 2022-07-19 NOTE — Anesthesia Preprocedure Evaluation (Addendum)
Anesthesia Evaluation  Patient identified by MRN, date of birth, ID band Patient awake    Reviewed: Allergy & Precautions, NPO status , Patient's Chart, lab work & pertinent test results  History of Anesthesia Complications (+) PONV  Airway Mallampati: II  TM Distance: >3 FB Neck ROM: Full    Dental  (+) Missing, Chipped, Dental Advisory Given   Pulmonary shortness of breath, COPD,  COPD inhaler, former smoker RLL mass S/p R middle lobectomy   breath sounds clear to auscultation       Cardiovascular (-) angina + Peripheral Vascular Disease   Rhythm:Regular Rate:Normal  '22 Stress  The left ventricular ejection fraction is hyperdynamic (>65%).  Nuclear stress EF: 76%.  There was no ST segment deviation noted during stress.  The study is normal. '222 ECHO: EF 60 to 65%. The LV has normal function, no regional wall motion abnormalities. Grade I diastolic dysfunction. Right ventricular systolic function is normal, no significant valvular abnormalities    Neuro/Psych negative neurological ROS     GI/Hepatic Neg liver ROS, hiatal hernia,GERD  Medicated and Controlled,,  Endo/Other  negative endocrine ROS    Renal/GU negative Renal ROS     Musculoskeletal   Abdominal   Peds  Hematology negative hematology ROS (+)   Anesthesia Other Findings   Reproductive/Obstetrics                             Anesthesia Physical Anesthesia Plan  ASA: 3  Anesthesia Plan: General   Post-op Pain Management: Tylenol PO (pre-op)*   Induction: Intravenous  PONV Risk Score and Plan: 4 or greater and Ondansetron, Dexamethasone and TIVA  Airway Management Planned: Oral ETT  Additional Equipment: None  Intra-op Plan:   Post-operative Plan: Extubation in OR  Informed Consent: I have reviewed the patients History and Physical, chart, labs and discussed the procedure including the risks, benefits and  alternatives for the proposed anesthesia with the patient or authorized representative who has indicated his/her understanding and acceptance.     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  She was referred to cardiologist Dr. Tomie China for preoperative risk stratification prior to undergoing RML lobectomy in February 2022.  Per Dr. Kem Parkinson note 03/01/2020, "In view of multivessel calcification and multiple risk factors we will do a Lexiscan sestamibi.  If this is negative then she is not at high risk for coronary events during the aforementioned surgery.  Meticulous hemodynamic monitoring will further reduce the risk of coronary events." Nuclear stress 03/04/2020 was low risk: Echo 03/03/2020 showed EF 60 to 65%, grade 1 DD, mild MR, mild TR.  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  Recent PET/CT shows hypermetabolic hilar and mediastinal adenopathy consistent with recurrent lung cancer.  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  Preop labs reviewed, unremarkable.  EKG 07/10/2022: NSR.  Rate 73.  CT chest 06/06/2022: IMPRESSION: 1. Stable surgical changes from right middle lobe lobectomy. No findings suspicious for recurrent pulmonary neoplasm or pulmonary metastatic disease. 2. Definite interval increase in size of mediastinal nodes worrisome for metastatic adenopathy. PET-CT suggested for further evaluation. 3. No findings for upper abdominal metastatic disease or osseous metastatic disease.  PFTs 02/25/2020: FVC-%Pred-Pre  Latest Units: % 89 FEV1-%Pred-Pre Latest Units: % 77 FEV1FVC-%Pred-Pre Latest Units: % 85 TLC % pred Latest Units: % 121 RV % pred Latest Units: % 165 DLCO unc % pred Latest Units: % 81  Nuclear  stress 03/04/2020: ? The left ventricular ejection fraction is hyperdynamic (>65%). ? Nuclear stress EF: 76%. ? There was no ST segment deviation noted during stress. ? The study is normal. ? This is a low risk study.   TTE 03/03/2020: 1. Left ventricular ejection fraction, by estimation, is 60 to 65%. The  left ventricle has normal function. The left ventricle has no regional  wall motion abnormalities. Left ventricular diastolic parameters are  consistent with Grade I diastolic  dysfunction (impaired relaxation).  2. Right ventricular systolic function is normal. The right ventricular  size is normal. There is normal pulmonary artery systolic pressure.  3. The mitral valve is normal in structure. Mild mitral valve  regurgitation. No evidence of mitral stenosis.  4. The aortic valve is normal in structure. Aortic valve regurgitation is  not visualized. No aortic stenosis is present.  5. The inferior vena cava is normal in size with greater than 50%  respiratory variability, suggesting right atrial pressure of 3 mmHg.   )        Anesthesia Quick Evaluation

## 2022-07-20 ENCOUNTER — Other Ambulatory Visit: Payer: Self-pay

## 2022-07-20 ENCOUNTER — Ambulatory Visit (HOSPITAL_BASED_OUTPATIENT_CLINIC_OR_DEPARTMENT_OTHER): Payer: Medicare HMO

## 2022-07-20 ENCOUNTER — Ambulatory Visit (HOSPITAL_COMMUNITY): Payer: Medicare HMO | Admitting: Physician Assistant

## 2022-07-20 ENCOUNTER — Encounter (HOSPITAL_COMMUNITY): Payer: Self-pay | Admitting: Thoracic Surgery (Cardiothoracic Vascular Surgery)

## 2022-07-20 ENCOUNTER — Ambulatory Visit (HOSPITAL_COMMUNITY)
Admission: RE | Admit: 2022-07-20 | Discharge: 2022-07-20 | Disposition: A | Discharge status: Home / Self Care | Payer: Medicare HMO | Attending: Thoracic Surgery (Cardiothoracic Vascular Surgery) | Admitting: Thoracic Surgery (Cardiothoracic Vascular Surgery)

## 2022-07-20 ENCOUNTER — Encounter (HOSPITAL_COMMUNITY)
Admission: RE | Disposition: A | Discharge status: Home / Self Care | Payer: Self-pay | Source: Home / Self Care | Attending: Thoracic Surgery (Cardiothoracic Vascular Surgery)

## 2022-07-20 ENCOUNTER — Other Ambulatory Visit: Payer: Self-pay | Admitting: Thoracic Surgery (Cardiothoracic Vascular Surgery)

## 2022-07-20 DIAGNOSIS — R846 Abnormal cytological findings in specimens from respiratory organs and thorax: Secondary | ICD-10-CM | POA: Diagnosis not present

## 2022-07-20 DIAGNOSIS — C3491 Malignant neoplasm of unspecified part of right bronchus or lung: Secondary | ICD-10-CM | POA: Diagnosis not present

## 2022-07-20 DIAGNOSIS — Z87891 Personal history of nicotine dependence: Secondary | ICD-10-CM

## 2022-07-20 DIAGNOSIS — C3400 Malignant neoplasm of unspecified main bronchus: Secondary | ICD-10-CM

## 2022-07-20 DIAGNOSIS — R590 Localized enlarged lymph nodes: Secondary | ICD-10-CM

## 2022-07-20 DIAGNOSIS — J449 Chronic obstructive pulmonary disease, unspecified: Secondary | ICD-10-CM

## 2022-07-20 DIAGNOSIS — C3490 Malignant neoplasm of unspecified part of unspecified bronchus or lung: Secondary | ICD-10-CM

## 2022-07-20 DIAGNOSIS — C801 Malignant (primary) neoplasm, unspecified: Secondary | ICD-10-CM | POA: Diagnosis not present

## 2022-07-20 DIAGNOSIS — R849 Unspecified abnormal finding in specimens from respiratory organs and thorax: Secondary | ICD-10-CM | POA: Diagnosis not present

## 2022-07-20 DIAGNOSIS — Z85118 Personal history of other malignant neoplasm of bronchus and lung: Secondary | ICD-10-CM | POA: Insufficient documentation

## 2022-07-20 DIAGNOSIS — Z902 Acquired absence of lung [part of]: Secondary | ICD-10-CM | POA: Insufficient documentation

## 2022-07-20 DIAGNOSIS — C771 Secondary and unspecified malignant neoplasm of intrathoracic lymph nodes: Secondary | ICD-10-CM | POA: Diagnosis not present

## 2022-07-20 DIAGNOSIS — K449 Diaphragmatic hernia without obstruction or gangrene: Secondary | ICD-10-CM | POA: Diagnosis not present

## 2022-07-20 HISTORY — PX: VIDEO BRONCHOSCOPY WITH ENDOBRONCHIAL ULTRASOUND: SHX6177

## 2022-07-20 SURGERY — BRONCHOSCOPY, WITH EBUS
Anesthesia: General | Site: Bronchus

## 2022-07-20 MED ORDER — ONDANSETRON HCL 4 MG/2ML IJ SOLN
INTRAMUSCULAR | Status: DC | PRN
  Administered 2022-07-20: 4 mg via INTRAVENOUS

## 2022-07-20 MED ORDER — LACTATED RINGERS IV SOLN: INTRAVENOUS | Status: DC | PRN

## 2022-07-20 MED ORDER — FENTANYL CITRATE (PF) 250 MCG/5ML IJ SOLN
INTRAMUSCULAR | Status: AC
  Filled 2022-07-20: qty 5

## 2022-07-20 MED ORDER — PROMETHAZINE HCL 25 MG/ML IJ SOLN: 6.2500 mg | INTRAMUSCULAR | Status: DC | PRN

## 2022-07-20 MED ORDER — PROPOFOL 10 MG/ML IV BOLUS
INTRAVENOUS | Status: DC | PRN
  Administered 2022-07-20: 150 mg via INTRAVENOUS

## 2022-07-20 MED ORDER — LIDOCAINE 2% (20 MG/ML) 5 ML SYRINGE
INTRAMUSCULAR | Status: DC | PRN
  Administered 2022-07-20: 20 mg via INTRAVENOUS

## 2022-07-20 MED ORDER — FENTANYL CITRATE (PF) 250 MCG/5ML IJ SOLN
INTRAMUSCULAR | Status: DC | PRN
  Administered 2022-07-20 (×5): 50 ug via INTRAVENOUS

## 2022-07-20 MED ORDER — PROPOFOL 10 MG/ML IV BOLUS
INTRAVENOUS | Status: AC
  Filled 2022-07-20: qty 20

## 2022-07-20 MED ORDER — ORAL CARE MOUTH RINSE: 15.0000 mL | Freq: Once | OROMUCOSAL | Status: AC

## 2022-07-20 MED ORDER — FENTANYL CITRATE (PF) 100 MCG/2ML IJ SOLN: 25.0000 ug | INTRAMUSCULAR | Status: DC | PRN

## 2022-07-20 MED ORDER — ACETAMINOPHEN 500 MG PO TABS
1000.0000 mg | ORAL_TABLET | Freq: Once | ORAL | Status: AC
  Administered 2022-07-20: 1000 mg via ORAL
  Filled 2022-07-20: qty 2

## 2022-07-20 MED ORDER — EPINEPHRINE PF 1 MG/ML IJ SOLN
INTRAMUSCULAR | Status: DC | PRN
  Administered 2022-07-20: .3 mg via ENDOTRACHEOPULMONARY

## 2022-07-20 MED ORDER — LIDOCAINE 2% (20 MG/ML) 5 ML SYRINGE
INTRAMUSCULAR | Status: AC
  Filled 2022-07-20: qty 5

## 2022-07-20 MED ORDER — DEXAMETHASONE SODIUM PHOSPHATE 10 MG/ML IJ SOLN
INTRAMUSCULAR | Status: DC | PRN
  Administered 2022-07-20: 10 mg via INTRAVENOUS

## 2022-07-20 MED ORDER — LACTATED RINGERS IV SOLN: INTRAVENOUS | Status: DC

## 2022-07-20 MED ORDER — CHLORHEXIDINE GLUCONATE 0.12 % MT SOLN
15.0000 mL | Freq: Once | OROMUCOSAL | Status: AC
  Administered 2022-07-20: 15 mL via OROMUCOSAL
  Filled 2022-07-20: qty 15

## 2022-07-20 MED ORDER — PROPOFOL 500 MG/50ML IV EMUL
INTRAVENOUS | Status: DC | PRN
  Administered 2022-07-20: 100 ug/kg/min via INTRAVENOUS

## 2022-07-20 MED ORDER — EPINEPHRINE PF 1 MG/ML IJ SOLN
INTRAMUSCULAR | Status: AC
  Filled 2022-07-20: qty 1

## 2022-07-20 MED ORDER — OXYCODONE HCL 5 MG PO TABS: 5.0000 mg | ORAL_TABLET | Freq: Once | ORAL | Status: DC | PRN

## 2022-07-20 MED ORDER — DEXAMETHASONE SODIUM PHOSPHATE 10 MG/ML IJ SOLN
INTRAMUSCULAR | Status: AC
  Filled 2022-07-20: qty 1

## 2022-07-20 MED ORDER — SUGAMMADEX SODIUM 200 MG/2ML IV SOLN
INTRAVENOUS | Status: DC | PRN
  Administered 2022-07-20: 200 mg via INTRAVENOUS

## 2022-07-20 MED ORDER — 0.9 % SODIUM CHLORIDE (POUR BTL) OPTIME
TOPICAL | Status: DC | PRN
  Administered 2022-07-20: 1000 mL

## 2022-07-20 MED ORDER — ROCURONIUM BROMIDE 10 MG/ML (PF) SYRINGE
PREFILLED_SYRINGE | INTRAVENOUS | Status: DC | PRN
  Administered 2022-07-20: 60 mg via INTRAVENOUS

## 2022-07-20 MED ORDER — OXYCODONE HCL 5 MG/5ML PO SOLN: 5.0000 mg | Freq: Once | ORAL | Status: DC | PRN

## 2022-07-20 MED ORDER — MIDAZOLAM HCL 2 MG/2ML IJ SOLN: 0.5000 mg | Freq: Once | INTRAMUSCULAR | Status: DC | PRN

## 2022-07-20 MED ORDER — MIDAZOLAM HCL 2 MG/2ML IJ SOLN
INTRAMUSCULAR | Status: AC
  Filled 2022-07-20: qty 2

## 2022-07-20 MED ORDER — ROCURONIUM BROMIDE 10 MG/ML (PF) SYRINGE
PREFILLED_SYRINGE | INTRAVENOUS | Status: AC
  Filled 2022-07-20: qty 10

## 2022-07-20 MED ORDER — ONDANSETRON HCL 4 MG/2ML IJ SOLN
INTRAMUSCULAR | Status: AC
  Filled 2022-07-20: qty 2

## 2022-07-20 MED ORDER — MIDAZOLAM HCL 2 MG/2ML IJ SOLN
INTRAMUSCULAR | Status: DC | PRN
  Administered 2022-07-20: 2 mg via INTRAVENOUS

## 2022-07-20 SURGICAL SUPPLY — 40 items
ADAPTER VALVE BIOPSY EBUS (MISCELLANEOUS) IMPLANT
ADPTR VALVE BIOPSY EBUS (MISCELLANEOUS)
BRUSH CYTOL CELLEBRITY 1.5X140 (MISCELLANEOUS) IMPLANT
CANISTER SUCT 3000ML PPV (MISCELLANEOUS) ×1 IMPLANT
CNTNR URN SCR LID CUP LEK RST (MISCELLANEOUS) ×1 IMPLANT
CONT SPEC 4OZ STRL OR WHT (MISCELLANEOUS) ×1
COVER BACK TABLE 60X90IN (DRAPES) ×1 IMPLANT
FILTER STRAW FLUID ASPIR (MISCELLANEOUS) IMPLANT
FORCEPS BIOP RJ4 1.8 (CUTTING FORCEPS) IMPLANT
FORCEPS RADIAL JAW LRG 4 PULM (INSTRUMENTS) IMPLANT
GAUZE 4X4 16PLY ~~LOC~~+RFID DBL (SPONGE) ×1 IMPLANT
GAUZE SPONGE 4X4 12PLY STRL (GAUZE/BANDAGES/DRESSINGS) IMPLANT
GLOVE SS BIOGEL STRL SZ 7.5 (GLOVE) ×2 IMPLANT
GOWN STRL REUS W/ TWL XL LVL3 (GOWN DISPOSABLE) ×1 IMPLANT
GOWN STRL REUS W/TWL XL LVL3 (GOWN DISPOSABLE) ×1
KIT CLEAN ENDO COMPLIANCE (KITS) ×2 IMPLANT
KIT TURNOVER KIT B (KITS) ×1 IMPLANT
MARKER SKIN DUAL TIP RULER LAB (MISCELLANEOUS) ×1 IMPLANT
NDL ASPIRATION VIZISHOT 19G (NEEDLE) IMPLANT
NDL ASPIRATION VIZISHOT 21G (NEEDLE) ×1 IMPLANT
NDL BLUNT 18X1 FOR OR ONLY (NEEDLE) IMPLANT
NEEDLE ASPIRATION VIZISHOT 19G (NEEDLE) ×2 IMPLANT
NEEDLE ASPIRATION VIZISHOT 21G (NEEDLE) IMPLANT
NEEDLE BLUNT 18X1 FOR OR ONLY (NEEDLE) IMPLANT
NS IRRIG 1000ML POUR BTL (IV SOLUTION) ×1 IMPLANT
OIL SILICONE PENTAX (PARTS (SERVICE/REPAIRS)) ×1 IMPLANT
PAD ARMBOARD 7.5X6 YLW CONV (MISCELLANEOUS) ×2 IMPLANT
SYR 20ML ECCENTRIC (SYRINGE) ×2 IMPLANT
SYR 20ML LL LF (SYRINGE) ×1 IMPLANT
SYR 3ML LL SCALE MARK (SYRINGE) IMPLANT
SYR 5ML LL (SYRINGE) ×1 IMPLANT
SYR 5ML LUER SLIP (SYRINGE) ×1 IMPLANT
TOWEL GREEN STERILE (TOWEL DISPOSABLE) ×1 IMPLANT
TOWEL GREEN STERILE FF (TOWEL DISPOSABLE) ×1 IMPLANT
TRAP SPECIMEN MUCUS 40CC (MISCELLANEOUS) ×1 IMPLANT
TUBE CONNECTING 20X1/4 (TUBING) ×1 IMPLANT
VALVE BIOPSY  SINGLE USE (MISCELLANEOUS) ×1
VALVE BIOPSY SINGLE USE (MISCELLANEOUS) ×1 IMPLANT
VALVE SUCTION BRONCHIO DISP (MISCELLANEOUS) ×1 IMPLANT
WATER STERILE IRR 1000ML POUR (IV SOLUTION) ×1 IMPLANT

## 2022-07-20 NOTE — Interval H&P Note (Signed)
History and Physical Interval Note:  07/20/2022 7:51 AM  [name]  has presented today for surgery, with the diagnosis of MEDIASTINAL AND HILAR ADENOPATHY.  The various methods of treatment have been discussed with the patient and family. After consideration of risks, benefits and other options for treatment, the patient has consented to  Procedure(s): VIDEO BRONCHOSCOPY WITH ENDOBRONCHIAL ULTRASOUND (N/A) as a surgical intervention.  The patient's history has been reviewed, patient examined, no change in status, stable for surgery.  I have reviewed the patient's chart and labs.  Questions were answered to the patient's satisfaction.     [name]

## 2022-07-20 NOTE — Anesthesia Procedure Notes (Addendum)
Procedure Name: Intubation Date/Time: 07/20/2022 8:33 AM  Performed by: Marena Chancy, CRNAPre-anesthesia Checklist: Patient identified, Emergency Drugs available, Suction available and Patient being monitored Patient Re-evaluated:Patient Re-evaluated prior to induction Oxygen Delivery Method: Circle System Utilized Preoxygenation: Pre-oxygenation with 100% oxygen Induction Type: IV induction Ventilation: Mask ventilation without difficulty Laryngoscope Size: Mac and 3 Grade View: Grade I Tube type: Oral Tube size: 8.5 mm Number of attempts: 1 Airway Equipment and Method: Stylet and Oral airway Placement Confirmation: ETT inserted through vocal cords under direct vision, positive ETCO2 and breath sounds checked- equal and bilateral Secured at: 23 cm Tube secured with: Tape Dental Injury: Teeth and Oropharynx as per pre-operative assessment

## 2022-07-20 NOTE — Discharge Instructions (Signed)
Do not drive or engage in heavy physical activity for 24 hours.  You may resume normal activities tomorrow.  You may cough up small amounts of blood over the next few days.  You may use acetaminophen (Tylenol) if needed for discomfort.  Call [phone] if you develop chest pain, shortness of breath, fever > 101 F or cough up more than a tablespoon of blood.  We will arrange appointments for you to see a Medical Oncologist and a Radiation Oncologist.  We will arrange for you to have a MR scan of the brain to complete the staging process.

## 2022-07-20 NOTE — Anesthesia Postprocedure Evaluation (Signed)
Anesthesia Post Note  Patient: [name]  Procedure(s) Performed: VIDEO BRONCHOSCOPY WITH ENDOBRONCHIAL ULTRASOUND ([place])     Patient location during evaluation: PACU Anesthesia Type: General Level of consciousness: awake and alert Pain management: pain level controlled Vital Signs Assessment: post-procedure vital signs reviewed and stable Respiratory status: spontaneous breathing, nonlabored ventilation and respiratory function stable Cardiovascular status: blood pressure returned to baseline and stable Postop Assessment: no apparent nausea or vomiting and adequate PO intake Anesthetic complications: no   No notable events documented.  Last Vitals:  Vitals:   07/20/22 1039 07/20/22 1040  BP: (!) 146/77   Pulse: 72 71  Resp: (!) 24 (!) 22  Temp:  36.7 C  SpO2: 96% 97%    Last Pain:  Vitals:   07/20/22 1040  TempSrc:   PainSc: 0-No pain                 Tibor Lemmons,E. Hitomi Slape

## 2022-07-20 NOTE — Transfer of Care (Signed)
Immediate Anesthesia Transfer of Care Note  Patient: [name]  Procedure(s) Performed: VIDEO BRONCHOSCOPY WITH ENDOBRONCHIAL ULTRASOUND ([place])  Patient Location: PACU  Anesthesia Type:General  Level of Consciousness: awake, alert , and oriented  Airway & Oxygen Therapy: Patient Spontanous Breathing and Patient connected to face mask oxygen  Post-op Assessment: Report given to RN and Post -op Vital signs reviewed and stable  Post vital signs: Reviewed and stable  Last Vitals:  Vitals Value Taken Time  BP 116/64 07/20/22 0930  Temp    Pulse 95 07/20/22 0931  Resp 19 07/20/22 0931  SpO2 95 % 07/20/22 0931  Vitals shown include unvalidated device data.  Last Pain:  Vitals:   07/20/22 0621  TempSrc: Oral         Complications: No notable events documented.

## 2022-07-21 ENCOUNTER — Encounter (HOSPITAL_COMMUNITY): Payer: Self-pay | Admitting: Thoracic Surgery (Cardiothoracic Vascular Surgery)

## 2022-07-21 LAB — SURGICAL PATHOLOGY

## 2022-07-21 LAB — CYTOLOGY - NON PAP

## 2022-07-21 NOTE — Op Note (Signed)
[name], [name] MEDICAL RECORD NO: [id number] ACCOUNT NO: [id number] DATE OF BIRTH: 05-01-1950 FACILITY: [facility] LOCATION: [facility] PHYSICIAN: Salvatore Decent. Dorris Fetch, MD  Operative Report   DATE OF PROCEDURE: 07/20/2022  PREOPERATIVE DIAGNOSES:  Mediastinal adenopathy, probable recurrent lung cancer.  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  PROCEDURE:  Bronchoscopy and endobronchial ultrasound.  SURGEON:  Salvatore Decent. Dorris Fetch, MD  ASSISTANT:  None.  ANESTHESIA:  General.  FINDINGS: Quick prep on aspirations of level 7 node were positive for tumor. Some abnormal-appearing mucosa, but no definite endobronchial tumor in the right middle lobe stump and the origin of the right lower lobe bronchus. Quick prep on brushings showed benign bronchial cells.  CLINICAL NOTE:  [name] is a 72 year old woman with a history of stage IB adenocarcinoma of the lung, who had a right middle lobectomy by Dr. Tyrone Sage. She returned for a followup scan recently, which showed new mediastinal adenopathy. On PET/CT, the nodes were metabolically active and there also was some activity at her previous resection site.  She was advised to undergo bronchoscopy and endobronchial ultrasound for diagnostic purposes.  The indications, risks, benefits, and alternatives were  discussed in detail with the patient.  She understood and accepted the risks and agreed to proceed.  DESCRIPTION OF PROCEDURE:  The patient was brought to the operating room on 07/20/2022.  She had induction of general anesthesia and was intubated.  Sequential compression devices were placed on the calves for DVT prophylaxis.  A timeout was performed.    Flexible fiberoptic bronchoscopy was performed via the endotracheal tube.  There was a staple line visible in the right middle lobe bronchus.  There was a short stump of the right middle lobe bronchus still present.  There was some abnormal tissue at the suture line,  it was questioned whether this was edema or tumor, but it was not definitely tumor.  There also was some abnormal-appearing mucosa at the origin of the right lower lobe bronchus with the same differential. The left sided airways were within normal limits.  The bronchoscope was removed.  The endobronchial ultrasound scope was advanced. The subcarinal nodes were inspected.  There was an obviously enlarged node that was easily accessible with a good window. Needle aspiration was performed.  The quick prep showed malignant cells.  Multiple additional needle aspirations were performed, all were done with a 19-gauge needle with realtime ultrasound visualization.  The remainder of the specimens were placed into cytologic preparation fluid for cell block.  The endobronchial ultrasound probe was removed.  The bronchoscope was reinserted.  Brushings were performed of the right lower lobe bronchus and the area of the unusual-appearing mucosa and then multiple biopsies were taken.  Biopsies were also taken at the mucosa near the staple line in  the right middle lobe.  There was some minor bleeding, which resolved with application of topical dilute epinephrine solution.  There was no ongoing bleeding at the completion of the procedure.  The bronchoscope was removed.  The patient then was extubated in the operating room and taken to the postanesthetic care unit in good condition.   VAI D: 07/21/2022 7:09:38 pm T: 07/21/2022 9:25:00 pm  JOB: [id number]/ [id number]

## 2022-07-24 LAB — CYTOLOGY - NON PAP

## 2022-07-25 ENCOUNTER — Telehealth: Payer: Self-pay | Admitting: Radiation Oncology

## 2022-07-25 NOTE — Telephone Encounter (Signed)
Pt returned call and schedule CON for 6/11 as requested by her due to schedule conflicts.

## 2022-07-25 NOTE — Telephone Encounter (Signed)
LVM to schedule CON with Dr. Mitzi Hansen. Referral received from Dr. Dorris Fetch 5/30

## 2022-07-28 ENCOUNTER — Telehealth: Payer: Self-pay | Admitting: Internal Medicine

## 2022-07-28 NOTE — Telephone Encounter (Signed)
scheduled per referral, pt has been called and confirmed date and time. Pt is aware of location and to arrive early for check in   

## 2022-07-31 ENCOUNTER — Other Ambulatory Visit: Payer: Self-pay | Admitting: Medical Oncology

## 2022-07-31 DIAGNOSIS — C3491 Malignant neoplasm of unspecified part of right bronchus or lung: Secondary | ICD-10-CM

## 2022-07-31 DIAGNOSIS — Z4689 Encounter for fitting and adjustment of other specified devices: Secondary | ICD-10-CM | POA: Diagnosis not present

## 2022-07-31 NOTE — Progress Notes (Signed)
Thoracic Location of Tumor / Histology: Right Middle Lobe Lung, hilar and mediastinal adenopathy.  MRI Brain 08/15/2022:  PET 07/03/2022: Hypermetabolic mediastinal and right hilar lymphadenopathy consistent with metastatic disease.  Stable surgical changes from right middle lobe lobectomy. No findings for locally recurrent disease or pulmonary metastatic disease. 3. No findings for metastatic disease involving the  abdomen/pelvis or bony structures.  CT Chest 06/06/2022: Stable surgical changes from right middle lobe lobectomy. No findings suspicious for recurrent pulmonary neoplasm or pulmonary metastatic disease.  Definite interval increase in size of mediastinal nodes worrisome for metastatic adenopathy.  No findings for upper abdominal metastatic disease or osseous metastatic disease.  CT Chest 10/25/2021: Stable surgical changes from a right middle lobe lobectomy. No findings suspicious for recurrent tumor, mediastinal/hilar adenopathy or metastatic disease elsewhere.   Biopsies of Mediastinal and Hilar Nodes 07/20/2022    Tobacco/Marijuana/Snuff/ETOH use: Current Smoker   Past/Anticipated interventions by cardiothoracic surgery, if any:  Dr. Dorris Fetch 07/11/2022 -Right Middle Lobectomy Dr. 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  Now has hilar and mediastinal adenopathy is hypermetabolic on PET/CT.  Findings are consistent with recurrent lung cancer  -I recommended to her that we proceed with a bronchoscopy and endobronchial ultrasound for diagnosis and staging.    Past/Anticipated interventions by medical oncology, if any:  Dr. Arbutus Ped 08/01/2022 -  Signs/Symptoms Weight changes, if any: Fluctuates a few pounds. Respiratory complaints, if any: She reports occasional SOB with walking longer distances.  She reports she feels fatigued easier and more often. Hemoptysis, if any: Denies productive cough, she reports  some clearing of her throat often. Pain issues, if any: Denies chest pain, pressure, or tightness.   SAFETY ISSUES: Prior radiation? Right foot-plantar wart, 5 fractions (once a week) >20 years ago. Pacemaker/ICD? No  Possible current pregnancy? Postmenopausal Is the patient on methotrexate? No  Current Complaints / other details:

## 2022-08-01 ENCOUNTER — Encounter: Payer: Self-pay | Admitting: Internal Medicine

## 2022-08-01 ENCOUNTER — Other Ambulatory Visit: Payer: Self-pay

## 2022-08-01 ENCOUNTER — Inpatient Hospital Stay: Payer: Medicare HMO | Admitting: Internal Medicine

## 2022-08-01 ENCOUNTER — Encounter (HOSPITAL_COMMUNITY): Payer: Self-pay

## 2022-08-01 ENCOUNTER — Ambulatory Visit
Admission: RE | Admit: 2022-08-01 | Discharge: 2022-08-01 | Disposition: A | Discharge status: Home / Self Care | Payer: Medicare HMO | Source: Ambulatory Visit | Attending: Radiation Oncology | Admitting: Radiation Oncology

## 2022-08-01 ENCOUNTER — Inpatient Hospital Stay: Payer: Medicare HMO

## 2022-08-01 VITALS — BP 143/64 | HR 81 | Temp 97.9°F | Resp 17 | Ht 67.0 in | Wt 162.5 lb

## 2022-08-01 DIAGNOSIS — Z5111 Encounter for antineoplastic chemotherapy: Secondary | ICD-10-CM | POA: Diagnosis not present

## 2022-08-01 DIAGNOSIS — Z923 Personal history of irradiation: Secondary | ICD-10-CM | POA: Insufficient documentation

## 2022-08-01 DIAGNOSIS — K219 Gastro-esophageal reflux disease without esophagitis: Secondary | ICD-10-CM | POA: Insufficient documentation

## 2022-08-01 DIAGNOSIS — M47814 Spondylosis without myelopathy or radiculopathy, thoracic region: Secondary | ICD-10-CM | POA: Insufficient documentation

## 2022-08-01 DIAGNOSIS — I700 Atherosclerosis of aorta: Secondary | ICD-10-CM | POA: Insufficient documentation

## 2022-08-01 DIAGNOSIS — R590 Localized enlarged lymph nodes: Secondary | ICD-10-CM | POA: Insufficient documentation

## 2022-08-01 DIAGNOSIS — Z79899 Other long term (current) drug therapy: Secondary | ICD-10-CM | POA: Insufficient documentation

## 2022-08-01 DIAGNOSIS — Z902 Acquired absence of lung [part of]: Secondary | ICD-10-CM | POA: Insufficient documentation

## 2022-08-01 DIAGNOSIS — C771 Secondary and unspecified malignant neoplasm of intrathoracic lymph nodes: Secondary | ICD-10-CM | POA: Insufficient documentation

## 2022-08-01 DIAGNOSIS — C342 Malignant neoplasm of middle lobe, bronchus or lung: Secondary | ICD-10-CM | POA: Insufficient documentation

## 2022-08-01 DIAGNOSIS — E785 Hyperlipidemia, unspecified: Secondary | ICD-10-CM | POA: Insufficient documentation

## 2022-08-01 DIAGNOSIS — K449 Diaphragmatic hernia without obstruction or gangrene: Secondary | ICD-10-CM | POA: Insufficient documentation

## 2022-08-01 DIAGNOSIS — Z510 Encounter for antineoplastic radiation therapy: Secondary | ICD-10-CM | POA: Insufficient documentation

## 2022-08-01 DIAGNOSIS — Z87891 Personal history of nicotine dependence: Secondary | ICD-10-CM | POA: Insufficient documentation

## 2022-08-01 DIAGNOSIS — I2510 Atherosclerotic heart disease of native coronary artery without angina pectoris: Secondary | ICD-10-CM | POA: Insufficient documentation

## 2022-08-01 DIAGNOSIS — C3491 Malignant neoplasm of unspecified part of right bronchus or lung: Secondary | ICD-10-CM

## 2022-08-01 DIAGNOSIS — Z8616 Personal history of COVID-19: Secondary | ICD-10-CM | POA: Insufficient documentation

## 2022-08-01 DIAGNOSIS — J439 Emphysema, unspecified: Secondary | ICD-10-CM | POA: Insufficient documentation

## 2022-08-01 LAB — CBC WITH DIFFERENTIAL (CANCER CENTER ONLY)
Abs Immature Granulocytes: 0.02 10*3/uL (ref 0.00–0.07)
Basophils Absolute: 0 10*3/uL (ref 0.0–0.1)
Basophils Relative: 0 %
Eosinophils Absolute: 0.1 10*3/uL (ref 0.0–0.5)
Eosinophils Relative: 1 %
HCT: 38.7 % (ref 36.0–46.0)
Hemoglobin: 12.3 g/dL (ref 12.0–15.0)
Immature Granulocytes: 0 %
Lymphocytes Relative: 19 %
Lymphs Abs: 1.7 10*3/uL (ref 0.7–4.0)
MCH: 26 pg (ref 26.0–34.0)
MCHC: 31.8 g/dL (ref 30.0–36.0)
MCV: 81.8 fL (ref 80.0–100.0)
Monocytes Absolute: 0.6 10*3/uL (ref 0.1–1.0)
Monocytes Relative: 7 %
Neutro Abs: 6.8 10*3/uL (ref 1.7–7.7)
Neutrophils Relative %: 73 %
Platelet Count: 297 10*3/uL (ref 150–400)
RBC: 4.73 MIL/uL (ref 3.87–5.11)
RDW: 15.4 % (ref 11.5–15.5)
WBC Count: 9.3 10*3/uL (ref 4.0–10.5)
nRBC: 0 % (ref 0.0–0.2)

## 2022-08-01 LAB — CMP (CANCER CENTER ONLY)
ALT: 39 U/L (ref 0–44)
AST: 31 U/L (ref 15–41)
Albumin: 3.9 g/dL (ref 3.5–5.0)
Alkaline Phosphatase: 54 U/L (ref 38–126)
Anion gap: 7 (ref 5–15)
BUN: 14 mg/dL (ref 8–23)
CO2: 25 mmol/L (ref 22–32)
Calcium: 9.1 mg/dL (ref 8.9–10.3)
Chloride: 106 mmol/L (ref 98–111)
Creatinine: 0.99 mg/dL (ref 0.44–1.00)
GFR, Estimated: >60 mL/min (ref >60)
Glucose, Bld: 160 mg/dL — ABNORMAL HIGH (ref 70–99)
Potassium: 4 mmol/L (ref 3.5–5.1)
Sodium: 138 mmol/L (ref 135–145)
Total Bilirubin: 0.4 mg/dL (ref 0.3–1.2)
Total Protein: 6.9 g/dL (ref 6.5–8.1)

## 2022-08-01 MED ORDER — PROCHLORPERAZINE MALEATE 10 MG PO TABS: 10.0000 mg | ORAL_TABLET | Freq: Four times a day (QID) | ORAL | 0 refills | Status: DC | PRN

## 2022-08-01 NOTE — Progress Notes (Signed)
[facility] [phone]   Fax:([phone]) [phone]  CONSULT NOTE  REFERRING PHYSICIAN: Dr. Charlett Lango  REASON FOR CONSULTATION:  72 years old white female with recently diagnosed lung cancer.  HPI [name] is a 72 y.o. female with past medical history significant for COPD, COVID-19 infection, GERD, dyslipidemia as well as pneumonia.  The patient also has a history of stage Ib non-small cell lung cancer, adenocarcinoma (T2 a, N0, M0) status post right middle lobectomy with lymph node dissection under the care of Dr. Tyrone Sage on 03/29/2020.  She was followed by observation since that time and after the retirement of Dr. Tyrone Sage she was under the care of Dr. Dorris Fetch with repeat imaging studies at regular basis.  She had CT scan of the chest without contrast on 06/06/2022 that showed stable surgical changes from the right middle lobectomy with no findings suspicious for recurrent pulmonary neoplasm or pulmonary metastatic disease but there was definite interval increase in size of mediastinal lymph nodes worrisome for metastatic adenopathy.  There was no findings for upper abdominal metastatic disease or osseous metastasis.  On Jul 03, 2022 the patient had a PET scan that showed the enlarged mediastinal and hilar lymph nodes with hypermetabolism consistent with metastatic adenopathy.  There was pretracheal lymph node with SUV max of 8.0, right paratracheal node with SUV max of 8.18 and right hilar lymph node with SUV max of 7.59.  There was also subcarinal adenopathy with SUV max of 11.63.  There was no supraclavicular or axillary lymphadenopathy and no hypermetabolic breast masses or extrathoracic metastasis.  On 07/20/2022 the patient underwent bronchoscopy with endobronchial ultrasound under the care of Dr. Dorris Fetch.  The final pathology [id number]) of the fine-needle aspiration from level 7 lymph node showed non-small cell carcinoma. The tumor cells are  strongly and diffusely positive for TTF-1 while negative for p40.  The overall cytomorphologic and immunohistochemical features are consistent with metastatic adenocarcinoma.  Dr. Dorris Fetch kindly referred the patient to me today for evaluation and recommendation regarding treatment of her condition. When seen today she is feeling fine except for the cough but no significant chest pain, shortness of breath or hemoptysis.  She has no nausea, vomiting, diarrhea or constipation.  She denied having any headache or visual changes.  She has no recent weight loss or night sweats. Family history significant for father with pseudomyxoma peritonei of the appendix.  Mother had heart disease and brother had non-Hodgkin's lymphoma and another brother had diabetes mellitus. The patient is a widow and has 1 daughter and 2 grand children.  She was accompanied today by her granddaughter [name].  The patient has a history of smoking of 1 pack/day for around 50 years and quit 8 years ago.  She has no history of alcohol or drug abuse.  HPI  Past Medical History:  Diagnosis Date   Abnormal chest x-ray 01/19/2020   Acute laryngopharyngitis 03/18/2020   Aortic atherosclerosis (HCC) 01/28/2020   Arthritis    Cardiac murmur 03/01/2020   COPD (chronic obstructive pulmonary disease) (HCC)    Coronary artery calcification seen on CT scan 03/01/2020   Cough 01/12/2020   COVID-19 03/10/2020   Dyspnea    on exertion,after getting covid   Family history of adverse reaction to anesthesia    brothr had n/v   GERD (gastroesophageal reflux disease)    Hiatal hernia    Hyperlipemia    Lung cancer, middle lobe (HCC)    middle lobe removed   Pneumonia  PONV (postoperative nausea and vomiting)    Pre-operative cardiovascular examination 03/01/2020   Right lower lobe lung mass 01/28/2020   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   Visit for screening mammogram 01/28/2020    Past Surgical History:   Procedure Laterality Date   CATARACT EXTRACTION Bilateral 2023   LUNG REMOVAL, PARTIAL  03/29/2020   middle lobe removed   VIDEO BRONCHOSCOPY WITH ENDOBRONCHIAL ULTRASOUND N/A 07/20/2022   Procedure: VIDEO BRONCHOSCOPY WITH ENDOBRONCHIAL ULTRASOUND;  Surgeon: [name], MD;  Location: [facility] OR;  Service: Thoracic;  Laterality: N/A;    Family History  Problem Relation Age of Onset   Heart disease Mother    Heart attack Mother    Cancer Father    Diabetes Father    Non-Hodgkin's lymphoma Brother    High Cholesterol Brother    High Cholesterol Brother    Heart disease Brother    Heart attack Brother     Social History Social History   Tobacco Use   Smoking status: Former    Years: 50    Types: Cigarettes    Quit date: 03/22/2017    Years since quitting: 5.3   Smokeless tobacco: Never  Vaping Use   Vaping Use: Never used  Substance Use Topics   Alcohol use: Never   Drug use: Never    Allergies  Allergen Reactions   Codeine Hives   Surgical Lubricant Hives    Current Outpatient Medications  Medication Sig Dispense Refill   Ascorbic Acid (VITAMIN C PO) Take 3 tablets by mouth daily.     benzonatate (TESSALON) 200 MG capsule Take 1 capsule (200 mg total) by mouth 3 (three) times daily as needed for cough. 30 capsule 0   cetirizine (ZYRTEC) 10 MG tablet Take 10 mg by mouth daily.     Coenzyme Q10 (CO Q-10 PO) Take 1 tablet by mouth daily.     cyclobenzaprine (FLEXERIL) 5 MG tablet Take 1 tablet (5 mg total) by mouth 3 (three) times daily as needed for muscle spasms. (Patient not taking: Reported on 07/13/2022) 30 tablet 1   Fluticasone-Umeclidin-Vilant (TRELEGY ELLIPTA) 100-62.5-25 MCG/ACT AEPB Inhale 1 puff into the lungs daily. 2 each 0   Homeopathic Products (LEG CRAMPS) SUBL Place 2 tablets under the tongue daily as needed (leg cramps).     montelukast (SINGULAIR) 10 MG tablet TAKE 1 TABLET AT BEDTIME 90 tablet 3   Multiple Vitamin (MULTIVITAMIN ADULT PO)  Take 2 tablets by mouth daily.     mupirocin ointment (BACTROBAN) 2 % Apply 1 application topically 2 (two) times daily. (Patient taking differently: Apply 1 application  topically 2 (two) times daily as needed (wound care).) 22 g 2   omeprazole (PRILOSEC) 20 MG capsule TAKE 1 CAPSULE TWICE DAILY BEFORE MEALS 180 capsule 3   polyethylene glycol (MIRALAX / GLYCOLAX) 17 g packet Take 17 g by mouth daily as needed for moderate constipation.     rosuvastatin (CRESTOR) 5 MG tablet TAKE 1 TABLET EVERY DAY (Patient taking differently: Take 5 mg by mouth at bedtime.) 90 tablet 3   VITAMIN D PO Take 2 tablets by mouth daily.     No current facility-administered medications for this visit.    Review of Systems  Constitutional: positive for fatigue Eyes: negative Ears, nose, mouth, throat, and face: negative Respiratory: positive for cough Cardiovascular: negative Gastrointestinal: negative Genitourinary:negative Integument/breast: negative Hematologic/lymphatic: negative Musculoskeletal:negative Neurological: negative Behavioral/Psych: negative Endocrine: negative Allergic/Immunologic: negative  Physical Exam  SWF:UXNAT, healthy, no distress, well  nourished, well developed, and anxious SKIN: skin color, texture, turgor are normal, no rashes or significant lesions HEAD: Normocephalic, No masses, lesions, tenderness or abnormalities EYES: normal, PERRLA, Conjunctiva are pink and non-injected EARS: External ears normal, Canals clear OROPHARYNX:no exudate, no erythema, and lips, buccal mucosa, and tongue normal  NECK: supple, no adenopathy, no JVD LYMPH:  no palpable lymphadenopathy, no hepatosplenomegaly BREAST:not examined LUNGS: clear to auscultation , and palpation HEART: regular rate & rhythm, no murmurs, and no gallops ABDOMEN:abdomen soft, non-tender, normal bowel sounds, and no masses or organomegaly BACK: Back symmetric, no curvature., No CVA tenderness EXTREMITIES:no joint  deformities, effusion, or inflammation, no edema  NEURO: alert & oriented x 3 with fluent speech, no focal motor/sensory deficits  PERFORMANCE STATUS: ECOG 1  LABORATORY DATA: Lab Results  Component Value Date   WBC 9.3 08/01/2022   HGB 12.3 08/01/2022   HCT 38.7 08/01/2022   MCV 81.8 08/01/2022   PLT 297 08/01/2022      Chemistry      Component Value Date/Time   NA 134 (L) 07/18/2022 1500   NA 138 12/08/2021 0848   K 3.7 07/18/2022 1500   CL 102 07/18/2022 1500   CO2 26 07/18/2022 1500   BUN 11 07/18/2022 1500   BUN 9 12/08/2021 0848   CREATININE 0.99 07/18/2022 1500      Component Value Date/Time   CALCIUM 8.6 (L) 07/18/2022 1500   ALKPHOS 57 07/18/2022 1500   AST 32 07/18/2022 1500   ALT 35 07/18/2022 1500   BILITOT 0.6 07/18/2022 1500   BILITOT 0.4 12/08/2021 0848       RADIOGRAPHIC STUDIES: DG Chest 2 View  Result Date: 07/20/2022 CLINICAL DATA:  Preoperative exam.  Mediastinal adenopathy. EXAM: CHEST - 2 VIEW COMPARISON:  Chest radiograph 04/29/2020 FINDINGS: Stable cardiac and mediastinal contours. With prominence of the right hilum compatible with adenopathy. Slight improvement in the heterogeneous opacities in the right lower lung. Minimal opacity right upper lung may represent overlapping structures. No definite pleural effusion or pneumothorax. Thoracic spine degenerative changes. IMPRESSION: 1. Right hilar adenopathy. 2. Slight improvement in the right lower lung opacities. 3. Minimal opacity right upper lung may represent overlapping structures. Electronically Signed   By: Annia Belt M.D.   On: 07/20/2022 06:25   NM PET Image Restag (PS) Skull Base To Thigh  Result Date: 07/06/2022 CLINICAL DATA:  Subsequent treatment strategy for right lung cancer. Enlarging mediastinal hilar lymph nodes on recent chest CT. EXAM: NUCLEAR MEDICINE PET SKULL BASE TO THIGH TECHNIQUE: 7.78 mCi F-18 FDG was injected intravenously. Full-ring PET imaging was performed from the  skull base to thigh after the radiotracer. CT data was obtained and used for attenuation correction and anatomic localization. Fasting blood glucose: 109 mg/dl COMPARISON:  Multiple previous imaging studies. Remote PET-CT from 2021 and recent chest CT 06/06/2022 FINDINGS: Mediastinal blood pool activity: SUV max 2.13 Liver activity: SUV max NA NECK: No hypermetabolic lymph nodes in the neck. Incidental CT findings: None. CHEST: Stable surgical changes from right middle lobe lobectomy. No areas of hypermetabolism suggest locally recurrent disease. No new hypermetabolic pulmonary lesions or pulmonary nodules. The enlarged mediastinal and hilar lymph nodes seen on the recent chest CT demonstrate hypermetabolism and consistent with metastatic adenopathy. Pretracheal lymph node has an SUV max of 8.0. Right paratracheal node has an SUV max of 8.18. Right hilar node has an SUV max of 7.59. Subcarinal adenopathy has an SUV max of 11.63. No supraclavicular or axillary adenopathy. No hypermetabolic breast masses.  Incidental CT findings: Stable underlying emphysematous changes and pulmonary scarring. Stable aortic and coronary artery calcifications. ABDOMEN/PELVIS: No abnormal hypermetabolic activity within the liver, pancreas, adrenal glands, or spleen. No hypermetabolic lymph nodes in the abdomen or pelvis. Incidental CT findings: Diffuse fatty infiltration of the liver. Advanced atherosclerotic calcification involving the aorta and branch vessels but no aneurysm. High attenuation material in the gallbladder could be sludge or gallstones or both. Pessary ring noted in the vagina. SKELETON: No findings suspicious for osseous metastatic disease. Moderate hypermetabolism noted in the proximal right hamstring tendons likely significant tendinopathy or partial tear. Incidental CT findings: None. IMPRESSION: 1. Hypermetabolic mediastinal and right hilar lymphadenopathy consistent with metastatic disease. 2. Stable surgical changes  from right middle lobe lobectomy. No findings for locally recurrent disease or pulmonary metastatic disease. 3. No findings for metastatic disease involving the abdomen/pelvis or bony structures. 4. Diffuse fatty infiltration of the liver. Aortic Atherosclerosis (ICD10-I70.0). Electronically Signed   By: Rudie Meyer M.D.   On: 07/06/2022 10:17    ASSESSMENT: This is a very pleasant 72 years old white female diagnosed with stage IIIA (T2a, N2, M0) non-small cell lung cancer, adenocarcinoma that was initially diagnosed as a stage Ib (T2a, N0, M0) non-small cell lung cancer, adenocarcinoma status post right middle lobectomy on March 29, 2020 and the patient had evidence for disease recurrence in the mediastinal lymph nodes in May 2024. Molecular studies are still pending.  PLAN: I had a lengthy discussion with the patient and her granddaughter [name] today about her current disease stage, prognosis and treatment options. I recommended for the patient to keep her appointment for the MRI of the brain to rule out brain metastasis. I also discussed with the patient her treatment option and recommended for her a course of concurrent chemoradiation with weekly carboplatin for AUC of 2 and paclitaxel 45 Mg/M2 for 6-7 weeks.  This will be followed by consolidation treatment with immunotherapy with Imfinzi if the patient has no actionable EGFR or ALK mutations otherwise she would be treated with targeted therapy if she has any of these mutations in the adjuvant setting. I discussed with the patient the adverse effect of the chemotherapy including but not limited to alopecia, myelosuppression, nausea and vomiting, peripheral neuropathy, liver or renal dysfunction. She is expected to start the first dose of this treatment next week. The patient is scheduled to see Dr. Mitzi Hansen later today for discussion of the radiotherapy option. She will have a chemotherapy education class before the first dose of her treatment. I  will call her pharmacy with prescription for Compazine 10 mg p.o. every 6 hours as needed for nausea. I will see her back for follow-up visit in 2 weeks for evaluation and management of any adverse effect of her treatment. The patient was advised to call immediately if she has any concerning symptoms in the interval. The patient voices understanding of current disease status and treatment options and is in agreement with the current care plan.  All questions were answered. The patient knows to call the clinic with any problems, questions or concerns. We can certainly see the patient much sooner if necessary.  Thank you so much for allowing me to participate in the care of [facility]. I will continue to follow up the patient with you and assist in her care.  The total time spent in the appointment was 90 minutes.  Disclaimer: This note was dictated with voice recognition software. Similar sounding words can inadvertently be transcribed and may not  be corrected upon review.   Lajuana Matte August 01, 2022, 11:40 AM

## 2022-08-01 NOTE — Progress Notes (Signed)
NN met face to face with the pt today at her intial med onc consult with Dr Arbutus Ped. Pt was accompanied by her granddaughter, Elmarie Shiley.  Plan for the pt is to start Chemorad on 6/17 or 6/24, depending upon how quickly pt can get her CT simulation with Radiation Oncology. NN will arrange a chemo teaching class for the pt prior to starting treatment.  Dr.Hendrickson sent pts tissue to [facility] for biomarker testing. Results pending at this time.  All questions answered. Contact information given to pt and Tiffany. Encouraged contacting NN with any questions or concerns.

## 2022-08-01 NOTE — Progress Notes (Signed)
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   HISTORY OF PRESENT ILLNESS: [name] is a 72 y.o. female seen at the request of Dr. Dorris Fetch. 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 She underwent a right middle lobectomy in 2022 under the gare of Dr. Tyrone Sage in 2024. Routine follow-up CT scan showed stable surgical changes from the lobectomy, but interval increase in size of mediastinal nodes worrisome for metastatic adenopathy. Follow-up PET scan was recommended for further evaluation. PET 07/03/22 that showed hypermetabolic mediastinal and right hilar lymphadenopathy consistent with metastatic disease. Patient proceeded with a biopsy of the right middle lobe and right upper lobe on 07/20/22. Results were negative for malignancy.   [name] met with Dr. Arbutus Ped on 08/01/22 to discuss chemotherapy options. He recommended 6.5 weeks of concurrent chemoradiation treatment with Paclitaxel and Carboplatin. Patient is scheduled for her first cycle of chemotherapy Monday, 08/07/22.  Patient was kindly referred to [name] today to discuss radiation treatment.   She is present today with her supportive granddaughter. She reports to be doing well overall. She has some shortness of breath on exertion, but denies any cough, hemoptysis, or chest pain.  PREVIOUS RADIATION THERAPY: Yes   Patient states she received 5 fractions of radiation to her foot in the 1970s.    PAST MEDICAL HISTORY:  Past Medical History:  Diagnosis Date   Abnormal chest x-ray 01/19/2020   Acute laryngopharyngitis 03/18/2020   Aortic atherosclerosis (HCC) 01/28/2020    Arthritis    Cardiac murmur 03/01/2020   COPD (chronic obstructive pulmonary disease) (HCC)    Coronary artery calcification seen on CT scan 03/01/2020   Cough 01/12/2020   COVID-19 03/10/2020   Dyspnea    on exertion,after getting covid   Family history of adverse reaction to anesthesia    brothr had n/v   GERD (gastroesophageal reflux disease)    Hiatal hernia    Hyperlipemia    Lung cancer, middle lobe (HCC)    middle lobe removed   Pneumonia    PONV (postoperative nausea and vomiting)    Pre-operative cardiovascular examination 03/01/2020   Right lower lobe lung mass 01/28/2020   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   Visit for screening mammogram 01/28/2020       PAST SURGICAL HISTORY: Past Surgical History:  Procedure Laterality Date   CATARACT EXTRACTION Bilateral 2023   LUNG REMOVAL, PARTIAL  03/29/2020   middle lobe removed   VIDEO BRONCHOSCOPY WITH ENDOBRONCHIAL ULTRASOUND N/A 07/20/2022   Procedure: VIDEO BRONCHOSCOPY WITH ENDOBRONCHIAL ULTRASOUND;  Surgeon: [name], MD;  Location: [facility] OR;  Service: Thoracic;  Laterality: N/A;     FAMILY HISTORY:  Family History  Problem Relation Age of Onset   Heart disease Mother    Heart attack Mother    Cancer Father    Diabetes Father    Non-Hodgkin's lymphoma Brother    High Cholesterol Brother    High Cholesterol Brother    Heart disease Brother    Heart attack Brother      SOCIAL HISTORY:  reports that she quit smoking about 5 years  ago. Her smoking use included cigarettes. She has never used smokeless tobacco. She reports that she does not drink alcohol and does not use drugs.   ALLERGIES: Codeine and Surgical lubricant   MEDICATIONS:  Current Outpatient Medications  Medication Sig Dispense Refill   Ascorbic Acid (VITAMIN C PO) Take 3 tablets by mouth daily.     benzonatate (TESSALON) 200 MG capsule Take 1 capsule (200 mg total) by mouth 3 (three) times daily as needed for  cough. 30 capsule 0   cetirizine (ZYRTEC) 10 MG tablet Take 10 mg by mouth daily.     Coenzyme Q10 (CO Q-10 PO) Take 1 tablet by mouth daily.     Fluticasone-Umeclidin-Vilant (TRELEGY ELLIPTA) 100-62.5-25 MCG/ACT AEPB Inhale 1 puff into the lungs daily. 2 each 0   Homeopathic Products (LEG CRAMPS) SUBL Place 2 tablets under the tongue daily as needed (leg cramps).     montelukast (SINGULAIR) 10 MG tablet TAKE 1 TABLET AT BEDTIME 90 tablet 3   Multiple Vitamin (MULTIVITAMIN ADULT PO) Take 2 tablets by mouth daily.     mupirocin ointment (BACTROBAN) 2 % Apply 1 application topically 2 (two) times daily. (Patient taking differently: Apply 1 application  topically 2 (two) times daily as needed (wound care).) 22 g 2   omeprazole (PRILOSEC) 20 MG capsule TAKE 1 CAPSULE TWICE DAILY BEFORE MEALS 180 capsule 3   polyethylene glycol (MIRALAX / GLYCOLAX) 17 g packet Take 17 g by mouth daily as needed for moderate constipation.     prochlorperazine (COMPAZINE) 10 MG tablet Take 1 tablet (10 mg total) by mouth every 6 (six) hours as needed for nausea or vomiting. 30 tablet 0   rosuvastatin (CRESTOR) 5 MG tablet TAKE 1 TABLET EVERY DAY (Patient taking differently: Take 5 mg by mouth at bedtime.) 90 tablet 3   VITAMIN D PO Take 2 tablets by mouth daily.     cyclobenzaprine (FLEXERIL) 5 MG tablet Take 1 tablet (5 mg total) by mouth 3 (three) times daily as needed for muscle spasms. (Patient not taking: Reported on 07/13/2022) 30 tablet 1   No current facility-administered medications for this encounter.     REVIEW OF SYSTEMS: As per HPI     PHYSICAL EXAM:  Wt Readings from Last 3 Encounters:  08/01/22 162 lb 8 oz (73.7 kg)  07/20/22 164 lb (74.4 kg)  07/18/22 164 lb (74.4 kg)   Temp Readings from Last 3 Encounters:  08/01/22 97.9 F (36.6 C) (Oral)  07/20/22 98 F (36.7 C)  07/18/22 97.7 F (36.5 C)   BP Readings from Last 3 Encounters:  08/01/22 (!) 143/64  07/20/22 (!) 146/77  07/18/22 (!)  126/57   Pulse Readings from Last 3 Encounters:  08/01/22 81  07/20/22 71  07/18/22 83    /10  In general this is a well appearing female in no acute distress. She's alert and oriented x4 and appropriate throughout the examination. Cardiopulmonary assessment is negative for acute distress and she exhibits normal effort.     ECOG = 1  0 - Asymptomatic (Fully active, able to carry on all predisease activities without restriction)  1 - Symptomatic but completely ambulatory (Restricted in physically strenuous activity but ambulatory and able to carry out work of a light or sedentary nature. For example, light housework, office work)  2 - Symptomatic, <50% in bed during the day (Ambulatory and capable of all self care but unable to carry out any work activities. Up and about more than 50% of waking  hours)  3 - Symptomatic, >50% in bed, but not bedbound (Capable of only limited self-care, confined to bed or chair 50% or more of waking hours)  4 - Bedbound (Completely disabled. Cannot carry on any self-care. Totally confined to bed or chair)  5 - Death   Santiago Glad MM, Creech RH, Tormey DC, et al. [phone]). "Toxicity and response criteria of the [facility] Group". Am. Evlyn Clines. Oncol. 5 (6): 649-55    LABORATORY DATA:  Lab Results  Component Value Date   WBC 9.3 08/01/2022   HGB 12.3 08/01/2022   HCT 38.7 08/01/2022   MCV 81.8 08/01/2022   PLT 297 08/01/2022   Lab Results  Component Value Date   NA 138 08/01/2022   K 4.0 08/01/2022   CL 106 08/01/2022   CO2 25 08/01/2022   Lab Results  Component Value Date   ALT 39 08/01/2022   AST 31 08/01/2022   ALKPHOS 54 08/01/2022   BILITOT 0.4 08/01/2022      RADIOGRAPHY: DG Chest 2 View  Result Date: 07/20/2022 CLINICAL DATA:  Preoperative exam.  Mediastinal adenopathy. EXAM: CHEST - 2 VIEW COMPARISON:  Chest radiograph 04/29/2020 FINDINGS: Stable cardiac and mediastinal contours. With prominence of the right hilum  compatible with adenopathy. Slight improvement in the heterogeneous opacities in the right lower lung. Minimal opacity right upper lung may represent overlapping structures. No definite pleural effusion or pneumothorax. Thoracic spine degenerative changes. IMPRESSION: 1. Right hilar adenopathy. 2. Slight improvement in the right lower lung opacities. 3. Minimal opacity right upper lung may represent overlapping structures. Electronically Signed   By: Annia Belt M.D.   On: 07/20/2022 06:25   NM PET Image Restag (PS) Skull Base To Thigh  Result Date: 07/06/2022 CLINICAL DATA:  Subsequent treatment strategy for right lung cancer. Enlarging mediastinal hilar lymph nodes on recent chest CT. EXAM: NUCLEAR MEDICINE PET SKULL BASE TO THIGH TECHNIQUE: 7.78 mCi F-18 FDG was injected intravenously. Full-ring PET imaging was performed from the skull base to thigh after the radiotracer. CT data was obtained and used for attenuation correction and anatomic localization. Fasting blood glucose: 109 mg/dl COMPARISON:  Multiple previous imaging studies. Remote PET-CT from 2021 and recent chest CT 06/06/2022 FINDINGS: Mediastinal blood pool activity: SUV max 2.13 Liver activity: SUV max NA NECK: No hypermetabolic lymph nodes in the neck. Incidental CT findings: None. CHEST: Stable surgical changes from right middle lobe lobectomy. No areas of hypermetabolism suggest locally recurrent disease. No new hypermetabolic pulmonary lesions or pulmonary nodules. The enlarged mediastinal and hilar lymph nodes seen on the recent chest CT demonstrate hypermetabolism and consistent with metastatic adenopathy. Pretracheal lymph node has an SUV max of 8.0. Right paratracheal node has an SUV max of 8.18. Right hilar node has an SUV max of 7.59. Subcarinal adenopathy has an SUV max of 11.63. No supraclavicular or axillary adenopathy. No hypermetabolic breast masses. Incidental CT findings: Stable underlying emphysematous changes and pulmonary  scarring. Stable aortic and coronary artery calcifications. ABDOMEN/PELVIS: No abnormal hypermetabolic activity within the liver, pancreas, adrenal glands, or spleen. No hypermetabolic lymph nodes in the abdomen or pelvis. Incidental CT findings: Diffuse fatty infiltration of the liver. Advanced atherosclerotic calcification involving the aorta and branch vessels but no aneurysm. High attenuation material in the gallbladder could be sludge or gallstones or both. Pessary ring noted in the vagina. SKELETON: No findings suspicious for osseous metastatic disease. Moderate hypermetabolism noted in the proximal right hamstring tendons likely significant tendinopathy or partial tear. Incidental CT findings:  None. IMPRESSION: 1. Hypermetabolic mediastinal and right hilar lymphadenopathy consistent with metastatic disease. 2. Stable surgical changes from right middle lobe lobectomy. No findings for locally recurrent disease or pulmonary metastatic disease. 3. No findings for metastatic disease involving the abdomen/pelvis or bony structures. 4. Diffuse fatty infiltration of the liver. Aortic Atherosclerosis (ICD10-I70.0). Electronically Signed   By: Rudie Meyer M.D.   On: 07/06/2022 10:17     Dr. Mitzi Hansen personally reviewed the images.   IMPRESSION/PLAN: 1. 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  Today, we discussed the pathology results and the nature of Stage IIIA lung cancer. Although biopsy was negative for carcinoma, enlarging hypermetabolic nodes are highly suspicious for malignancy. Dr. Mitzi Hansen is in agreement with plans for concurrent chemoradiation. First cycle of chemotherapy is scheduled for 08/07/22.    We discussed the risks, benefits, short, and long term effects of radiotherapy, as well as the curative intent, and the patient is interested in proceeding. Dr. Mitzi Hansen discussed the delivery and logistics of radiotherapy and anticipates a course of 6.5 weeks of radiotherapy  to the hypermetabolic lymphadenopathy. Patient has a good understanding of the treatment plan and is enthusiastic about beginning treatment. All questions were answered. A consent form was signed and placed in patient's chart today. Patient is scheduled for CT simulation on 08/02/22 with plans to begin radiation treatment on 08/07/22.    In a visit lasting 60 minutes, greater than 50% of the time was spent face to face discussing the patient's condition, in preparation for the discussion, and coordinating the patient's care.   The above documentation reflects my direct findings during this shared patient visit. Please see the separate note by Dr. Mitzi Hansen on this date for the remainder of the patient's plan of care.    Joyice Faster, PA-C    **Disclaimer: This note was dictated with voice recognition software. Similar sounding words can inadvertently be transcribed and this note may contain transcription errors which may not have been corrected upon publication of note.**

## 2022-08-01 NOTE — Progress Notes (Signed)
START ON PATHWAY REGIMEN - Non-Small Cell Lung     A cycle is every 7 days, concurrent with RT:     Paclitaxel      Carboplatin   **Always confirm dose/schedule in your pharmacy ordering system**  Patient Characteristics: Preoperative or Nonsurgical Candidate (Clinical Staging), Stage III - Nonsurgical Candidate (Nonsquamous and Squamous), PS = 0, 1 Therapeutic Status: Preoperative or Nonsurgical Candidate (Clinical Staging) AJCC T Category: cT2a AJCC N Category: cN2 AJCC M Category: cM0 AJCC 8 Stage Grouping: IIIA ECOG Performance Status: 1 Intent of Therapy: Curative Intent, Discussed with Patient 

## 2022-08-02 ENCOUNTER — Other Ambulatory Visit: Payer: Self-pay

## 2022-08-02 ENCOUNTER — Ambulatory Visit
Admission: RE | Admit: 2022-08-02 | Discharge: 2022-08-02 | Disposition: A | Discharge status: Home / Self Care | Payer: Medicare HMO | Source: Ambulatory Visit | Attending: Radiation Oncology | Admitting: Radiation Oncology

## 2022-08-02 DIAGNOSIS — Z87891 Personal history of nicotine dependence: Secondary | ICD-10-CM | POA: Diagnosis not present

## 2022-08-02 DIAGNOSIS — Z5111 Encounter for antineoplastic chemotherapy: Secondary | ICD-10-CM | POA: Diagnosis not present

## 2022-08-02 DIAGNOSIS — Z79899 Other long term (current) drug therapy: Secondary | ICD-10-CM | POA: Diagnosis not present

## 2022-08-02 DIAGNOSIS — C342 Malignant neoplasm of middle lobe, bronchus or lung: Secondary | ICD-10-CM | POA: Diagnosis not present

## 2022-08-02 DIAGNOSIS — Z510 Encounter for antineoplastic radiation therapy: Secondary | ICD-10-CM | POA: Diagnosis not present

## 2022-08-02 NOTE — Progress Notes (Signed)
The pt called this NN back after receiving VM. Pt expressed concern about her veins. The phlebotomist had to use the veins in her left hand to get blood. NN explained that the infusion nurses would need to use larger veins that those in her hands, so they'd likely be more in the [facility] area. Pt then stated that she can't use her R arm because of her lobectomy and the lymph nodes they removed. NN explained that the LNs they removed are in her chest, not her armpit, so there shouldn't be a limitation for use in that arm. Pt replied "Well why would they tell me that then?" Pt is asking  NN states that she will ask her CT surgeons office and Dr Arbutus Ped as well, then get back to her.

## 2022-08-03 ENCOUNTER — Other Ambulatory Visit: Payer: Self-pay

## 2022-08-04 ENCOUNTER — Other Ambulatory Visit: Payer: Self-pay

## 2022-08-04 ENCOUNTER — Telehealth: Payer: Self-pay | Admitting: Internal Medicine

## 2022-08-04 ENCOUNTER — Inpatient Hospital Stay: Payer: Medicare HMO

## 2022-08-04 DIAGNOSIS — Z87891 Personal history of nicotine dependence: Secondary | ICD-10-CM | POA: Diagnosis not present

## 2022-08-04 DIAGNOSIS — C342 Malignant neoplasm of middle lobe, bronchus or lung: Secondary | ICD-10-CM | POA: Diagnosis not present

## 2022-08-04 DIAGNOSIS — C3491 Malignant neoplasm of unspecified part of right bronchus or lung: Secondary | ICD-10-CM

## 2022-08-04 DIAGNOSIS — Z510 Encounter for antineoplastic radiation therapy: Secondary | ICD-10-CM | POA: Diagnosis not present

## 2022-08-04 DIAGNOSIS — Z5111 Encounter for antineoplastic chemotherapy: Secondary | ICD-10-CM | POA: Diagnosis not present

## 2022-08-04 DIAGNOSIS — Z79899 Other long term (current) drug therapy: Secondary | ICD-10-CM | POA: Diagnosis not present

## 2022-08-04 LAB — CBC WITH DIFFERENTIAL (CANCER CENTER ONLY)
Abs Immature Granulocytes: 0.05 10*3/uL (ref 0.00–0.07)
Basophils Absolute: 0 10*3/uL (ref 0.0–0.1)
Basophils Relative: 0 %
Eosinophils Absolute: 0.1 10*3/uL (ref 0.0–0.5)
Eosinophils Relative: 1 %
HCT: 41.1 % (ref 36.0–46.0)
Hemoglobin: 13.2 g/dL (ref 12.0–15.0)
Immature Granulocytes: 1 %
Lymphocytes Relative: 15 %
Lymphs Abs: 1.6 10*3/uL (ref 0.7–4.0)
MCH: 26.5 pg (ref 26.0–34.0)
MCHC: 32.1 g/dL (ref 30.0–36.0)
MCV: 82.5 fL (ref 80.0–100.0)
Monocytes Absolute: 0.8 10*3/uL (ref 0.1–1.0)
Monocytes Relative: 7 %
Neutro Abs: 8.5 10*3/uL — ABNORMAL HIGH (ref 1.7–7.7)
Neutrophils Relative %: 76 %
Platelet Count: 310 10*3/uL (ref 150–400)
RBC: 4.98 MIL/uL (ref 3.87–5.11)
RDW: 15.2 % (ref 11.5–15.5)
WBC Count: 11.1 10*3/uL — ABNORMAL HIGH (ref 4.0–10.5)
nRBC: 0 % (ref 0.0–0.2)

## 2022-08-04 LAB — CMP (CANCER CENTER ONLY)
ALT: 36 U/L (ref 0–44)
AST: 26 U/L (ref 15–41)
Albumin: 3.8 g/dL (ref 3.5–5.0)
Alkaline Phosphatase: 60 U/L (ref 38–126)
Anion gap: 7 (ref 5–15)
BUN: 17 mg/dL (ref 8–23)
CO2: 29 mmol/L (ref 22–32)
Calcium: 9.2 mg/dL (ref 8.9–10.3)
Chloride: 102 mmol/L (ref 98–111)
Creatinine: 0.96 mg/dL (ref 0.44–1.00)
GFR, Estimated: >60 mL/min (ref >60)
Glucose, Bld: 104 mg/dL — ABNORMAL HIGH (ref 70–99)
Potassium: 3.8 mmol/L (ref 3.5–5.1)
Sodium: 138 mmol/L (ref 135–145)
Total Bilirubin: 0.4 mg/dL (ref 0.3–1.2)
Total Protein: 6.7 g/dL (ref 6.5–8.1)

## 2022-08-04 MED FILL — Dexamethasone Sodium Phosphate Inj 100 MG/10ML: INTRAMUSCULAR | Qty: 1 | Status: AC

## 2022-08-04 NOTE — Telephone Encounter (Signed)
Scheduled per 06/11 los, patient has been called and notified.  

## 2022-08-07 ENCOUNTER — Other Ambulatory Visit: Payer: Self-pay

## 2022-08-07 ENCOUNTER — Inpatient Hospital Stay: Payer: Medicare HMO

## 2022-08-07 ENCOUNTER — Ambulatory Visit
Admission: RE | Admit: 2022-08-07 | Discharge: 2022-08-07 | Disposition: A | Discharge status: Home / Self Care | Payer: Medicare HMO | Source: Ambulatory Visit | Attending: Radiation Oncology | Admitting: Radiation Oncology

## 2022-08-07 VITALS — BP 132/65 | HR 62 | Temp 98.2°F | Resp 18 | Wt 161.5 lb

## 2022-08-07 DIAGNOSIS — C3491 Malignant neoplasm of unspecified part of right bronchus or lung: Secondary | ICD-10-CM

## 2022-08-07 DIAGNOSIS — Z5111 Encounter for antineoplastic chemotherapy: Secondary | ICD-10-CM | POA: Diagnosis not present

## 2022-08-07 DIAGNOSIS — Z87891 Personal history of nicotine dependence: Secondary | ICD-10-CM | POA: Diagnosis not present

## 2022-08-07 DIAGNOSIS — Z510 Encounter for antineoplastic radiation therapy: Secondary | ICD-10-CM | POA: Diagnosis not present

## 2022-08-07 DIAGNOSIS — Z79899 Other long term (current) drug therapy: Secondary | ICD-10-CM | POA: Diagnosis not present

## 2022-08-07 DIAGNOSIS — C342 Malignant neoplasm of middle lobe, bronchus or lung: Secondary | ICD-10-CM | POA: Diagnosis not present

## 2022-08-07 LAB — RAD ONC ARIA SESSION SUMMARY
Course Elapsed Days: 0
Plan Fractions Treated to Date: 1
Plan Prescribed Dose Per Fraction: 2 Gy
Plan Total Fractions Prescribed: 30
Plan Total Prescribed Dose: 60 Gy
Reference Point Dosage Given to Date: 2 Gy
Reference Point Session Dosage Given: 2 Gy
Session Number: 1

## 2022-08-07 MED ORDER — LIDOCAINE-PRILOCAINE 2.5-2.5 % EX CREA
TOPICAL_CREAM | CUTANEOUS | 3 refills | Status: DC
Start: 2022-08-07 — End: 2022-09-25

## 2022-08-07 MED ORDER — DEXAMETHASONE 4 MG PO TABS
ORAL_TABLET | ORAL | 1 refills | Status: DC
Start: 2022-08-07 — End: 2022-09-25

## 2022-08-07 MED ORDER — SODIUM CHLORIDE 0.9 % IV SOLN
10.0000 mg | Freq: Once | INTRAVENOUS | Status: AC
  Administered 2022-08-07: 10 mg via INTRAVENOUS
  Filled 2022-08-07: qty 10

## 2022-08-07 MED ORDER — SODIUM CHLORIDE 0.9 % IV SOLN: Freq: Once | INTRAVENOUS | Status: AC

## 2022-08-07 MED ORDER — SODIUM CHLORIDE 0.9 % IV SOLN
166.6000 mg | Freq: Once | INTRAVENOUS | Status: AC
  Administered 2022-08-07: 170 mg via INTRAVENOUS
  Filled 2022-08-07: qty 17

## 2022-08-07 MED ORDER — ONDANSETRON HCL 8 MG PO TABS
8.0000 mg | ORAL_TABLET | Freq: Three times a day (TID) | ORAL | 1 refills | Status: DC | PRN
Start: 2022-08-07 — End: 2022-09-18

## 2022-08-07 MED ORDER — SODIUM CHLORIDE 0.9 % IV SOLN
45.0000 mg/m2 | Freq: Once | INTRAVENOUS | Status: AC
  Administered 2022-08-07: 84 mg via INTRAVENOUS
  Filled 2022-08-07: qty 14

## 2022-08-07 MED ORDER — DIPHENHYDRAMINE HCL 50 MG/ML IJ SOLN
50.0000 mg | Freq: Once | INTRAMUSCULAR | Status: AC
  Administered 2022-08-07: 50 mg via INTRAVENOUS
  Filled 2022-08-07: qty 1

## 2022-08-07 MED ORDER — FAMOTIDINE IN NACL 20-0.9 MG/50ML-% IV SOLN
20.0000 mg | Freq: Once | INTRAVENOUS | Status: AC
  Administered 2022-08-07: 20 mg via INTRAVENOUS
  Filled 2022-08-07: qty 50

## 2022-08-07 MED ORDER — PALONOSETRON HCL INJECTION 0.25 MG/5ML
0.2500 mg | Freq: Once | INTRAVENOUS | Status: AC
  Administered 2022-08-07: 0.25 mg via INTRAVENOUS
  Filled 2022-08-07: qty 5

## 2022-08-07 MED ORDER — PROCHLORPERAZINE MALEATE 10 MG PO TABS
10.0000 mg | ORAL_TABLET | Freq: Four times a day (QID) | ORAL | 1 refills | Status: DC | PRN
Start: 2022-08-07 — End: 2022-09-25

## 2022-08-07 NOTE — Patient Instructions (Signed)
[facility]  Discharge Instructions: Thank you for choosing [facility] to provide your oncology and hematology care.   If you have a lab appointment with the Cancer Center, please go directly to the Cancer Center and check in at the registration area.   Wear comfortable clothing and clothing appropriate for easy access to any Portacath or PICC line.   We strive to give you quality time with your provider. You may need to reschedule your appointment if you arrive late (15 or more minutes).  Arriving late affects you and other patients whose appointments are after yours.  Also, if you miss three or more appointments without notifying the office, you may be dismissed from the clinic at the provider's discretion.      For prescription refill requests, have your pharmacy contact our office and allow 72 hours for refills to be completed.    Today you received the following chemotherapy and/or immunotherapy agents taxol, carboplatin     To help prevent nausea and vomiting after your treatment, we encourage you to take your nausea medication as directed.  BELOW ARE SYMPTOMS THAT SHOULD BE REPORTED IMMEDIATELY: *FEVER GREATER THAN 100.4 F (38 C) OR HIGHER *CHILLS OR SWEATING *NAUSEA AND VOMITING THAT IS NOT CONTROLLED WITH YOUR NAUSEA MEDICATION *UNUSUAL SHORTNESS OF BREATH *UNUSUAL BRUISING OR BLEEDING *URINARY PROBLEMS (pain or burning when urinating, or frequent urination) *BOWEL PROBLEMS (unusual diarrhea, constipation, pain near the anus) TENDERNESS IN MOUTH AND THROAT WITH OR WITHOUT PRESENCE OF ULCERS (sore throat, sores in mouth, or a toothache) UNUSUAL RASH, SWELLING OR PAIN  UNUSUAL VAGINAL DISCHARGE OR ITCHING   Items with * indicate a potential emergency and should be followed up as soon as possible or go to the Emergency Department if any problems should occur.  Please show the CHEMOTHERAPY ALERT CARD or IMMUNOTHERAPY ALERT CARD  at check-in to the Emergency Department and triage nurse.  Should you have questions after your visit or need to cancel or reschedule your appointment, please contact [facility]  Dept: [phone]  and follow the prompts.  Office hours are 8:00 a.m. to 4:30 p.m. Monday - Friday. Please note that voicemails left after 4:00 p.m. may not be returned until the following business day.  We are closed weekends and major holidays. You have access to a nurse at all times for urgent questions. Please call the main number to the clinic Dept: [phone] and follow the prompts.   For any non-urgent questions, you may also contact your provider using MyChart. We now offer e-Visits for anyone 18 and older to request care online for non-urgent symptoms. For details visit [email or web address].   Also download the MyChart app! Go to the app store, search "MyChart", open the app, select , and log in with your MyChart username and password.   

## 2022-08-08 ENCOUNTER — Other Ambulatory Visit: Payer: Self-pay

## 2022-08-08 ENCOUNTER — Encounter (HOSPITAL_COMMUNITY): Payer: Self-pay

## 2022-08-08 ENCOUNTER — Ambulatory Visit
Admission: RE | Admit: 2022-08-08 | Discharge: 2022-08-08 | Disposition: A | Discharge status: Home / Self Care | Payer: Medicare HMO | Source: Ambulatory Visit | Attending: Radiation Oncology | Admitting: Radiation Oncology

## 2022-08-08 ENCOUNTER — Telehealth: Payer: Self-pay

## 2022-08-08 DIAGNOSIS — Z510 Encounter for antineoplastic radiation therapy: Secondary | ICD-10-CM | POA: Diagnosis not present

## 2022-08-08 DIAGNOSIS — Z87891 Personal history of nicotine dependence: Secondary | ICD-10-CM | POA: Diagnosis not present

## 2022-08-08 DIAGNOSIS — C342 Malignant neoplasm of middle lobe, bronchus or lung: Secondary | ICD-10-CM | POA: Diagnosis not present

## 2022-08-08 DIAGNOSIS — Z5111 Encounter for antineoplastic chemotherapy: Secondary | ICD-10-CM | POA: Diagnosis not present

## 2022-08-08 DIAGNOSIS — Z79899 Other long term (current) drug therapy: Secondary | ICD-10-CM | POA: Diagnosis not present

## 2022-08-08 LAB — RAD ONC ARIA SESSION SUMMARY
Course Elapsed Days: 1
Plan Fractions Treated to Date: 2
Plan Prescribed Dose Per Fraction: 2 Gy
Plan Total Fractions Prescribed: 30
Plan Total Prescribed Dose: 60 Gy
Reference Point Dosage Given to Date: 4 Gy
Reference Point Session Dosage Given: 2 Gy
Session Number: 2

## 2022-08-08 NOTE — Telephone Encounter (Signed)
-----   Message from Coralee Rud, RN sent at 08/07/2022 11:53 AM EDT ----- Regarding: first time treatment call back- mohamed- carbo taxol Patient received carbo taxol for the first time today with no issues. She is followed by Dr. Arbutus Ped. She had no issues with treatments. Tolerated it well :)

## 2022-08-08 NOTE — Telephone Encounter (Signed)
LM for patient that this nurse was calling to see how they were doing after their treatment. Please call back to Dr. Mohamed's nurse at [phone] if they have any questions or concerns regarding the treatment.  

## 2022-08-09 ENCOUNTER — Ambulatory Visit
Admission: RE | Admit: 2022-08-09 | Discharge: 2022-08-09 | Disposition: A | Discharge status: Home / Self Care | Payer: Medicare HMO | Source: Ambulatory Visit | Attending: Radiation Oncology | Admitting: Radiation Oncology

## 2022-08-09 ENCOUNTER — Other Ambulatory Visit: Payer: Self-pay

## 2022-08-09 DIAGNOSIS — C342 Malignant neoplasm of middle lobe, bronchus or lung: Secondary | ICD-10-CM | POA: Diagnosis not present

## 2022-08-09 DIAGNOSIS — Z87891 Personal history of nicotine dependence: Secondary | ICD-10-CM | POA: Diagnosis not present

## 2022-08-09 DIAGNOSIS — Z510 Encounter for antineoplastic radiation therapy: Secondary | ICD-10-CM | POA: Diagnosis not present

## 2022-08-09 DIAGNOSIS — Z79899 Other long term (current) drug therapy: Secondary | ICD-10-CM | POA: Diagnosis not present

## 2022-08-09 DIAGNOSIS — Z5111 Encounter for antineoplastic chemotherapy: Secondary | ICD-10-CM | POA: Diagnosis not present

## 2022-08-09 LAB — RAD ONC ARIA SESSION SUMMARY
Course Elapsed Days: 2
Plan Fractions Treated to Date: 3
Plan Prescribed Dose Per Fraction: 2 Gy
Plan Total Fractions Prescribed: 30
Plan Total Prescribed Dose: 60 Gy
Reference Point Dosage Given to Date: 6 Gy
Reference Point Session Dosage Given: 2 Gy
Session Number: 3

## 2022-08-10 ENCOUNTER — Other Ambulatory Visit: Payer: Self-pay

## 2022-08-10 ENCOUNTER — Ambulatory Visit
Admission: RE | Admit: 2022-08-10 | Discharge: 2022-08-10 | Disposition: A | Discharge status: Home / Self Care | Payer: Medicare HMO | Source: Ambulatory Visit | Attending: Radiation Oncology | Admitting: Radiation Oncology

## 2022-08-10 DIAGNOSIS — Z510 Encounter for antineoplastic radiation therapy: Secondary | ICD-10-CM | POA: Diagnosis not present

## 2022-08-10 DIAGNOSIS — Z79899 Other long term (current) drug therapy: Secondary | ICD-10-CM | POA: Diagnosis not present

## 2022-08-10 DIAGNOSIS — Z5111 Encounter for antineoplastic chemotherapy: Secondary | ICD-10-CM | POA: Diagnosis not present

## 2022-08-10 DIAGNOSIS — C342 Malignant neoplasm of middle lobe, bronchus or lung: Secondary | ICD-10-CM | POA: Diagnosis not present

## 2022-08-10 DIAGNOSIS — Z87891 Personal history of nicotine dependence: Secondary | ICD-10-CM | POA: Diagnosis not present

## 2022-08-10 LAB — RAD ONC ARIA SESSION SUMMARY
Course Elapsed Days: 3
Plan Fractions Treated to Date: 4
Plan Prescribed Dose Per Fraction: 2 Gy
Plan Total Fractions Prescribed: 30
Plan Total Prescribed Dose: 60 Gy
Reference Point Dosage Given to Date: 8 Gy
Reference Point Session Dosage Given: 2 Gy
Session Number: 4

## 2022-08-11 ENCOUNTER — Other Ambulatory Visit: Payer: Self-pay

## 2022-08-11 ENCOUNTER — Ambulatory Visit
Admission: RE | Admit: 2022-08-11 | Discharge: 2022-08-11 | Disposition: A | Discharge status: Home / Self Care | Payer: Medicare HMO | Source: Ambulatory Visit | Attending: Radiation Oncology | Admitting: Radiation Oncology

## 2022-08-11 DIAGNOSIS — Z87891 Personal history of nicotine dependence: Secondary | ICD-10-CM | POA: Diagnosis not present

## 2022-08-11 DIAGNOSIS — Z79899 Other long term (current) drug therapy: Secondary | ICD-10-CM | POA: Diagnosis not present

## 2022-08-11 DIAGNOSIS — C342 Malignant neoplasm of middle lobe, bronchus or lung: Secondary | ICD-10-CM | POA: Diagnosis not present

## 2022-08-11 DIAGNOSIS — Z5111 Encounter for antineoplastic chemotherapy: Secondary | ICD-10-CM | POA: Diagnosis not present

## 2022-08-11 DIAGNOSIS — Z510 Encounter for antineoplastic radiation therapy: Secondary | ICD-10-CM | POA: Diagnosis not present

## 2022-08-11 DIAGNOSIS — C3491 Malignant neoplasm of unspecified part of right bronchus or lung: Secondary | ICD-10-CM

## 2022-08-11 LAB — RAD ONC ARIA SESSION SUMMARY
Course Elapsed Days: 4
Plan Fractions Treated to Date: 5
Plan Prescribed Dose Per Fraction: 2 Gy
Plan Total Fractions Prescribed: 30
Plan Total Prescribed Dose: 60 Gy
Reference Point Dosage Given to Date: 10 Gy
Reference Point Session Dosage Given: 2 Gy
Session Number: 5

## 2022-08-11 MED ORDER — SONAFINE EX EMUL
1.0000 | Freq: Once | CUTANEOUS | Status: AC
  Administered 2022-08-11: 1 via TOPICAL

## 2022-08-11 MED FILL — Dexamethasone Sodium Phosphate Inj 100 MG/10ML: INTRAMUSCULAR | Qty: 1 | Status: AC

## 2022-08-14 ENCOUNTER — Inpatient Hospital Stay: Payer: Medicare HMO

## 2022-08-14 ENCOUNTER — Other Ambulatory Visit: Payer: Self-pay

## 2022-08-14 ENCOUNTER — Ambulatory Visit
Admission: RE | Admit: 2022-08-14 | Discharge: 2022-08-14 | Disposition: A | Discharge status: Home / Self Care | Payer: Medicare HMO | Source: Ambulatory Visit | Attending: Radiation Oncology | Admitting: Radiation Oncology

## 2022-08-14 ENCOUNTER — Inpatient Hospital Stay: Payer: Medicare HMO | Admitting: Internal Medicine

## 2022-08-14 DIAGNOSIS — Z79899 Other long term (current) drug therapy: Secondary | ICD-10-CM | POA: Diagnosis not present

## 2022-08-14 DIAGNOSIS — C342 Malignant neoplasm of middle lobe, bronchus or lung: Secondary | ICD-10-CM | POA: Diagnosis not present

## 2022-08-14 DIAGNOSIS — Z510 Encounter for antineoplastic radiation therapy: Secondary | ICD-10-CM | POA: Diagnosis not present

## 2022-08-14 DIAGNOSIS — C3491 Malignant neoplasm of unspecified part of right bronchus or lung: Secondary | ICD-10-CM

## 2022-08-14 DIAGNOSIS — Z87891 Personal history of nicotine dependence: Secondary | ICD-10-CM | POA: Diagnosis not present

## 2022-08-14 DIAGNOSIS — Z5111 Encounter for antineoplastic chemotherapy: Secondary | ICD-10-CM | POA: Diagnosis not present

## 2022-08-14 LAB — RAD ONC ARIA SESSION SUMMARY
Course Elapsed Days: 7
Plan Fractions Treated to Date: 6
Plan Prescribed Dose Per Fraction: 2 Gy
Plan Total Fractions Prescribed: 30
Plan Total Prescribed Dose: 60 Gy
Reference Point Dosage Given to Date: 12 Gy
Reference Point Session Dosage Given: 2 Gy
Session Number: 6

## 2022-08-14 LAB — CMP (CANCER CENTER ONLY)
ALT: 40 U/L (ref 0–44)
AST: 28 U/L (ref 15–41)
Albumin: 3.8 g/dL (ref 3.5–5.0)
Alkaline Phosphatase: 46 U/L (ref 38–126)
Anion gap: 7 (ref 5–15)
BUN: 16 mg/dL (ref 8–23)
CO2: 28 mmol/L (ref 22–32)
Calcium: 9.8 mg/dL (ref 8.9–10.3)
Chloride: 104 mmol/L (ref 98–111)
Creatinine: 0.86 mg/dL (ref 0.44–1.00)
GFR, Estimated: >60 mL/min (ref >60)
Glucose, Bld: 113 mg/dL — ABNORMAL HIGH (ref 70–99)
Potassium: 3.8 mmol/L (ref 3.5–5.1)
Sodium: 139 mmol/L (ref 135–145)
Total Bilirubin: 0.4 mg/dL (ref 0.3–1.2)
Total Protein: 6.5 g/dL (ref 6.5–8.1)

## 2022-08-14 LAB — CBC WITH DIFFERENTIAL (CANCER CENTER ONLY)
Abs Immature Granulocytes: 0.05 10*3/uL (ref 0.00–0.07)
Basophils Absolute: 0 10*3/uL (ref 0.0–0.1)
Basophils Relative: 0 %
Eosinophils Absolute: 0.1 10*3/uL (ref 0.0–0.5)
Eosinophils Relative: 2 %
HCT: 38.5 % (ref 36.0–46.0)
Hemoglobin: 12.1 g/dL (ref 12.0–15.0)
Immature Granulocytes: 1 %
Lymphocytes Relative: 18 %
Lymphs Abs: 1.2 10*3/uL (ref 0.7–4.0)
MCH: 25.7 pg — ABNORMAL LOW (ref 26.0–34.0)
MCHC: 31.4 g/dL (ref 30.0–36.0)
MCV: 81.7 fL (ref 80.0–100.0)
Monocytes Absolute: 0.4 10*3/uL (ref 0.1–1.0)
Monocytes Relative: 6 %
Neutro Abs: 5 10*3/uL (ref 1.7–7.7)
Neutrophils Relative %: 73 %
Platelet Count: 302 10*3/uL (ref 150–400)
RBC: 4.71 MIL/uL (ref 3.87–5.11)
RDW: 14.6 % (ref 11.5–15.5)
WBC Count: 6.8 10*3/uL (ref 4.0–10.5)
nRBC: 0 % (ref 0.0–0.2)

## 2022-08-14 MED ORDER — FAMOTIDINE IN NACL 20-0.9 MG/50ML-% IV SOLN
20.0000 mg | Freq: Once | INTRAVENOUS | Status: AC
  Administered 2022-08-14: 20 mg via INTRAVENOUS
  Filled 2022-08-14: qty 50

## 2022-08-14 MED ORDER — PALONOSETRON HCL INJECTION 0.25 MG/5ML
0.2500 mg | Freq: Once | INTRAVENOUS | Status: AC
  Administered 2022-08-14: 0.25 mg via INTRAVENOUS
  Filled 2022-08-14: qty 5

## 2022-08-14 MED ORDER — SODIUM CHLORIDE 0.9 % IV SOLN
10.0000 mg | Freq: Once | INTRAVENOUS | Status: AC
  Administered 2022-08-14: 10 mg via INTRAVENOUS
  Filled 2022-08-14: qty 10

## 2022-08-14 MED ORDER — SODIUM CHLORIDE 0.9 % IV SOLN: Freq: Once | INTRAVENOUS | Status: AC

## 2022-08-14 MED ORDER — DIPHENHYDRAMINE HCL 50 MG/ML IJ SOLN
50.0000 mg | Freq: Once | INTRAMUSCULAR | Status: AC
  Administered 2022-08-14: 50 mg via INTRAVENOUS
  Filled 2022-08-14: qty 1

## 2022-08-14 MED ORDER — SODIUM CHLORIDE 0.9 % IV SOLN
166.6000 mg | Freq: Once | INTRAVENOUS | Status: AC
  Administered 2022-08-14: 170 mg via INTRAVENOUS
  Filled 2022-08-14: qty 17

## 2022-08-14 MED ORDER — SODIUM CHLORIDE 0.9 % IV SOLN
45.0000 mg/m2 | Freq: Once | INTRAVENOUS | Status: AC
  Administered 2022-08-14: 84 mg via INTRAVENOUS
  Filled 2022-08-14: qty 14

## 2022-08-14 NOTE — Progress Notes (Signed)
[facility] [phone]   [phone]  OFFICE PROGRESS NOTE  [name], PA-C [address] [place] [address]  DIAGNOSIS:  stage IIIA (T2a, N2, M0) non-small cell lung cancer, adenocarcinoma that was initially diagnosed as a stage Ib (T2a, N0, M0) non-small cell lung cancer, adenocarcinoma status post right middle lobectomy on March 29, 2020 and the patient had evidence for disease recurrence in the mediastinal lymph nodes in May 2024.   MOLECULAR STUDY by [facility]:  KRAS G12C mutation [id number] ATRX [id number] R56L  PDL1 Expression 100%   PRIOR THERAPY: None  CURRENT THERAPY: Concurrent chemoradiation with weekly carboplatin for AUC of 2 and paclitaxel 45 Mg/M2.  First dose August 07, 2022.  Status post 1 cycle.  INTERVAL HISTORY: [name] 72 y.o. female returns to the clinic today for follow-up accompanied by her daughter [name].  The patient is feeling fine today with no concerning complaints.  She tolerated the first week of her treatment with concurrent chemoradiation fairly well except for some abdominal pain for a few days after the first dose of her chemotherapy.  She denied having any current chest pain, shortness of breath but has mild cough with no hemoptysis.  She has no nausea, vomiting, diarrhea or constipation.  She has no headache or visual changes.  She is here today for evaluation before starting cycle #2.  MEDICAL HISTORY: Past Medical History:  Diagnosis Date   Abnormal chest x-ray 01/19/2020   Acute laryngopharyngitis 03/18/2020   Aortic atherosclerosis (HCC) 01/28/2020   Arthritis    Cardiac murmur 03/01/2020   COPD (chronic obstructive pulmonary disease) (HCC)    Coronary artery calcification seen on CT scan 03/01/2020   Cough 01/12/2020   COVID-19 03/10/2020   Dyspnea    on exertion,after getting covid   Family history of adverse reaction to anesthesia    brothr had n/v   GERD  (gastroesophageal reflux disease)    Hiatal hernia    Hyperlipemia    Lung cancer, middle lobe (HCC)    middle lobe removed   Pneumonia    PONV (postoperative nausea and vomiting)    Pre-operative cardiovascular examination 03/01/2020   Right lower lobe lung mass 01/28/2020   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  MEDICATIONS:  Current Outpatient Medications  Medication Sig Dispense Refill   Ascorbic Acid (VITAMIN C PO) Take 3 tablets by mouth daily.     benzonatate (TESSALON) 200 MG capsule Take 1 capsule (200 mg total) by mouth 3 (three) times daily as needed for cough. 30 capsule 0   cetirizine (ZYRTEC) 10 MG tablet Take 10 mg by mouth daily.     Coenzyme Q10 (CO Q-10 PO) Take 1 tablet by mouth daily.     cyclobenzaprine (FLEXERIL) 5 MG tablet Take 1 tablet (5 mg total) by mouth 3 (three) times daily as needed for muscle spasms. (Patient not taking: Reported on 07/13/2022) 30 tablet 1   dexamethasone (DECADRON) 4 MG tablet Take 2 tablets daily for 2 days, start the day after chemotherapy. Take with food. 30 tablet 1   Fluticasone-Umeclidin-Vilant (TRELEGY ELLIPTA) 100-62.5-25 MCG/ACT AEPB Inhale 1 puff into the lungs daily. 2 each 0   Homeopathic Products (LEG CRAMPS) SUBL Place 2 tablets under the tongue daily as needed (leg cramps).     lidocaine-prilocaine (EMLA) cream Apply to  affected area once 30 g 3   montelukast (SINGULAIR) 10 MG tablet TAKE 1 TABLET AT BEDTIME 90 tablet 3   Multiple Vitamin (MULTIVITAMIN ADULT PO) Take 2 tablets by mouth daily.     mupirocin ointment (BACTROBAN) 2 % Apply 1 application topically 2 (two) times daily. (Patient taking differently: Apply 1 application  topically 2 (two) times daily as needed (wound care).) 22 g 2   omeprazole (PRILOSEC) 20 MG capsule TAKE 1 CAPSULE TWICE DAILY BEFORE MEALS 180 capsule 3   ondansetron (ZOFRAN) 8  MG tablet Take 1 tablet (8 mg total) by mouth every 8 (eight) hours as needed for nausea or vomiting. Start on the third day after chemotherapy. 30 tablet 1   polyethylene glycol (MIRALAX / GLYCOLAX) 17 g packet Take 17 g by mouth daily as needed for moderate constipation.     prochlorperazine (COMPAZINE) 10 MG tablet Take 1 tablet (10 mg total) by mouth every 6 (six) hours as needed for nausea or vomiting. 30 tablet 0   prochlorperazine (COMPAZINE) 10 MG tablet Take 1 tablet (10 mg total) by mouth every 6 (six) hours as needed for nausea or vomiting. 30 tablet 1   rosuvastatin (CRESTOR) 5 MG tablet TAKE 1 TABLET EVERY DAY (Patient taking differently: Take 5 mg by mouth at bedtime.) 90 tablet 3   VITAMIN D PO Take 2 tablets by mouth daily.     No current facility-administered medications for this visit.    SURGICAL HISTORY:  Past Surgical History:  Procedure Laterality Date   CATARACT EXTRACTION Bilateral 2023   LUNG REMOVAL, PARTIAL  03/29/2020   middle lobe removed   VIDEO BRONCHOSCOPY WITH ENDOBRONCHIAL ULTRASOUND N/A 07/20/2022   Procedure: VIDEO BRONCHOSCOPY WITH ENDOBRONCHIAL ULTRASOUND;  Surgeon: [name], MD;  Location: [facility] OR;  Service: Thoracic;  Laterality: N/A;    REVIEW OF SYSTEMS:  A comprehensive review of systems was negative except for: Constitutional: positive for fatigue   PHYSICAL EXAMINATION: General appearance: alert, cooperative, fatigued, and no distress Head: Normocephalic, without obvious abnormality, atraumatic Neck: no adenopathy, no JVD, supple, symmetrical, trachea midline, and thyroid not enlarged, symmetric, no tenderness/mass/nodules Lymph nodes: Cervical, supraclavicular, and axillary nodes normal. Resp: clear to auscultation bilaterally Back: symmetric, no curvature. ROM normal. No CVA tenderness. Cardio: regular rate and rhythm, S1, S2 normal, no murmur, click, rub or gallop GI: soft, non-tender; bowel sounds normal; no masses,  no  organomegaly Extremities: extremities normal, atraumatic, no cyanosis or edema  ECOG PERFORMANCE STATUS: 1 - Symptomatic but completely ambulatory  Blood pressure (!) 118/55, pulse 88, temperature (!) 97.5 F (36.4 C), temperature source Oral, height 5\' 7"  (1.702 m), weight 161 lb 4.8 oz (73.2 kg), SpO2 97 %.  LABORATORY DATA: Lab Results  Component Value Date   WBC 11.1 (H) 08/04/2022   HGB 13.2 08/04/2022   HCT 41.1 08/04/2022   MCV 82.5 08/04/2022   PLT 310 08/04/2022      Chemistry      Component Value Date/Time   NA 138 08/04/2022 1521   NA 138 12/08/2021 0848   K 3.8 08/04/2022 1521   CL 102 08/04/2022 1521   CO2 29 08/04/2022 1521   BUN 17 08/04/2022 1521   BUN 9 12/08/2021 0848   CREATININE 0.96 08/04/2022 1521      Component Value Date/Time   CALCIUM 9.2 08/04/2022 1521   ALKPHOS 60 08/04/2022 1521   AST 26 08/04/2022 1521   ALT 36 08/04/2022 1521   BILITOT 0.4 08/04/2022 1521  RADIOGRAPHIC STUDIES: DG Chest 2 View  Result Date: 07/20/2022 CLINICAL DATA:  Preoperative exam.  Mediastinal adenopathy. EXAM: CHEST - 2 VIEW COMPARISON:  Chest radiograph 04/29/2020 FINDINGS: Stable cardiac and mediastinal contours. With prominence of the right hilum compatible with adenopathy. Slight improvement in the heterogeneous opacities in the right lower lung. Minimal opacity right upper lung may represent overlapping structures. No definite pleural effusion or pneumothorax. Thoracic spine degenerative changes. IMPRESSION: 1. Right hilar adenopathy. 2. Slight improvement in the right lower lung opacities. 3. Minimal opacity right upper lung may represent overlapping structures. Electronically Signed   By: Annia Belt M.D.   On: 07/20/2022 06:25    ASSESSMENT AND PLAN: This is a very pleasant 72 years old white female with  stage IIIA (T2a, N2, M0) non-small cell lung cancer, adenocarcinoma that was initially diagnosed as a stage Ib (T2a, N0, M0) non-small cell lung cancer,  adenocarcinoma status post right middle lobectomy on March 29, 2020 and the patient had evidence for disease recurrence in the mediastinal lymph nodes in May 2024.  Molecular studies by [facility] showed positive KRAS G12C mutation and PD-L1 expression of 100%. The patient is currently undergoing a course of concurrent chemoradiation with weekly carboplatin for AUC of 2 and paclitaxel 45 Mg/M2 status post 1 cycle.  She tolerated the first cycle of her treatment fairly well except for mild abdominal pain that lasted for few days but completely resolved at this point. I recommended for the patient to proceed with cycle #2 today as planned. I will see her back for follow-up visit in 2 weeks for evaluation before starting cycle #4. The patient was advised to call immediately if she has any other concerning symptoms in the interval. The patient voices understanding of current disease status and treatment options and is in agreement with the current care plan.  All questions were answered. The patient knows to call the clinic with any problems, questions or concerns. We can certainly see the patient much sooner if necessary.  The total time spent in the appointment was 20 minutes.  Disclaimer: This note was dictated with voice recognition software. Similar sounding words can inadvertently be transcribed and may not be corrected upon review.

## 2022-08-14 NOTE — Progress Notes (Signed)
NN saw pt today at her f/u appt with Dr.Mohamed. Pt is accompanied by her daughter, [name]. Pt states she is doing well. She had some stomach pain a few days after her first treatment, but no nausea/vomiting and has had regular bowel movements for which she is on a bowel regimen. Pt was concerned that on her med list she doesn't think she got the Dexamethasone to take the day after her chemotherapy. NN encouraged her to bring up her concern with her infusion nurses. Pt has no other concerns at this time.

## 2022-08-14 NOTE — Patient Instructions (Signed)
[facility]  Discharge Instructions: Thank you for choosing [facility] to provide your oncology and hematology care.   If you have a lab appointment with the Cancer Center, please go directly to the Cancer Center and check in at the registration area.   Wear comfortable clothing and clothing appropriate for easy access to any Portacath or PICC line.   We strive to give you quality time with your provider. You may need to reschedule your appointment if you arrive late (15 or more minutes).  Arriving late affects you and other patients whose appointments are after yours.  Also, if you miss three or more appointments without notifying the office, you may be dismissed from the clinic at the provider's discretion.      For prescription refill requests, have your pharmacy contact our office and allow 72 hours for refills to be completed.    Today you received the following chemotherapy and/or immunotherapy agents taxol, carboplatin     To help prevent nausea and vomiting after your treatment, we encourage you to take your nausea medication as directed.  BELOW ARE SYMPTOMS THAT SHOULD BE REPORTED IMMEDIATELY: *FEVER GREATER THAN 100.4 F (38 C) OR HIGHER *CHILLS OR SWEATING *NAUSEA AND VOMITING THAT IS NOT CONTROLLED WITH YOUR NAUSEA MEDICATION *UNUSUAL SHORTNESS OF BREATH *UNUSUAL BRUISING OR BLEEDING *URINARY PROBLEMS (pain or burning when urinating, or frequent urination) *BOWEL PROBLEMS (unusual diarrhea, constipation, pain near the anus) TENDERNESS IN MOUTH AND THROAT WITH OR WITHOUT PRESENCE OF ULCERS (sore throat, sores in mouth, or a toothache) UNUSUAL RASH, SWELLING OR PAIN  UNUSUAL VAGINAL DISCHARGE OR ITCHING   Items with * indicate a potential emergency and should be followed up as soon as possible or go to the Emergency Department if any problems should occur.  Please show the CHEMOTHERAPY ALERT CARD or IMMUNOTHERAPY ALERT CARD  at check-in to the Emergency Department and triage nurse.  Should you have questions after your visit or need to cancel or reschedule your appointment, please contact [facility]  Dept: [phone]  and follow the prompts.  Office hours are 8:00 a.m. to 4:30 p.m. Monday - Friday. Please note that voicemails left after 4:00 p.m. may not be returned until the following business day.  We are closed weekends and major holidays. You have access to a nurse at all times for urgent questions. Please call the main number to the clinic Dept: [phone] and follow the prompts.   For any non-urgent questions, you may also contact your provider using MyChart. We now offer e-Visits for anyone 18 and older to request care online for non-urgent symptoms. For details visit [email or web address].   Also download the MyChart app! Go to the app store, search "MyChart", open the app, select [facility], and log in with your MyChart username and password.   

## 2022-08-15 ENCOUNTER — Ambulatory Visit: Admission: RE | Admit: 2022-08-15 | Payer: Medicare HMO | Source: Ambulatory Visit

## 2022-08-15 ENCOUNTER — Ambulatory Visit
Admission: RE | Admit: 2022-08-15 | Discharge: 2022-08-15 | Disposition: A | Discharge status: Home / Self Care | Payer: Medicare HMO | Source: Ambulatory Visit | Attending: Thoracic Surgery (Cardiothoracic Vascular Surgery) | Admitting: Thoracic Surgery (Cardiothoracic Vascular Surgery)

## 2022-08-15 DIAGNOSIS — C3490 Malignant neoplasm of unspecified part of unspecified bronchus or lung: Secondary | ICD-10-CM | POA: Diagnosis not present

## 2022-08-15 MED ORDER — GADOPICLENOL 0.5 MMOL/ML IV SOLN
7.0000 mL | Freq: Once | INTRAVENOUS | Status: AC | PRN
  Administered 2022-08-15: 7 mL via INTRAVENOUS

## 2022-08-16 ENCOUNTER — Ambulatory Visit
Admission: RE | Admit: 2022-08-16 | Discharge: 2022-08-16 | Disposition: A | Discharge status: Home / Self Care | Payer: Medicare HMO | Source: Ambulatory Visit | Attending: Radiation Oncology | Admitting: Radiation Oncology

## 2022-08-16 ENCOUNTER — Other Ambulatory Visit: Payer: Self-pay

## 2022-08-16 DIAGNOSIS — Z510 Encounter for antineoplastic radiation therapy: Secondary | ICD-10-CM | POA: Diagnosis not present

## 2022-08-16 DIAGNOSIS — C342 Malignant neoplasm of middle lobe, bronchus or lung: Secondary | ICD-10-CM | POA: Diagnosis not present

## 2022-08-16 DIAGNOSIS — Z5111 Encounter for antineoplastic chemotherapy: Secondary | ICD-10-CM | POA: Diagnosis not present

## 2022-08-16 DIAGNOSIS — Z87891 Personal history of nicotine dependence: Secondary | ICD-10-CM | POA: Diagnosis not present

## 2022-08-16 DIAGNOSIS — Z79899 Other long term (current) drug therapy: Secondary | ICD-10-CM | POA: Diagnosis not present

## 2022-08-16 LAB — RAD ONC ARIA SESSION SUMMARY
Course Elapsed Days: 9
Plan Fractions Treated to Date: 7
Plan Prescribed Dose Per Fraction: 2 Gy
Plan Total Fractions Prescribed: 30
Plan Total Prescribed Dose: 60 Gy
Reference Point Dosage Given to Date: 14 Gy
Reference Point Session Dosage Given: 2 Gy
Session Number: 7

## 2022-08-17 ENCOUNTER — Ambulatory Visit
Admission: RE | Admit: 2022-08-17 | Discharge: 2022-08-17 | Disposition: A | Discharge status: Home / Self Care | Payer: Medicare HMO | Source: Ambulatory Visit | Attending: Radiation Oncology | Admitting: Radiation Oncology

## 2022-08-17 ENCOUNTER — Other Ambulatory Visit: Payer: Self-pay

## 2022-08-17 DIAGNOSIS — Z510 Encounter for antineoplastic radiation therapy: Secondary | ICD-10-CM | POA: Diagnosis not present

## 2022-08-17 DIAGNOSIS — Z5111 Encounter for antineoplastic chemotherapy: Secondary | ICD-10-CM | POA: Diagnosis not present

## 2022-08-17 DIAGNOSIS — Z87891 Personal history of nicotine dependence: Secondary | ICD-10-CM | POA: Diagnosis not present

## 2022-08-17 DIAGNOSIS — Z79899 Other long term (current) drug therapy: Secondary | ICD-10-CM | POA: Diagnosis not present

## 2022-08-17 DIAGNOSIS — C342 Malignant neoplasm of middle lobe, bronchus or lung: Secondary | ICD-10-CM | POA: Diagnosis not present

## 2022-08-17 LAB — RAD ONC ARIA SESSION SUMMARY
Course Elapsed Days: 10
Plan Fractions Treated to Date: 8
Plan Prescribed Dose Per Fraction: 2 Gy
Plan Total Fractions Prescribed: 30
Plan Total Prescribed Dose: 60 Gy
Reference Point Dosage Given to Date: 16 Gy
Reference Point Session Dosage Given: 2 Gy
Session Number: 8

## 2022-08-18 ENCOUNTER — Other Ambulatory Visit: Payer: Self-pay | Admitting: Radiation Oncology

## 2022-08-18 ENCOUNTER — Other Ambulatory Visit: Payer: Self-pay

## 2022-08-18 ENCOUNTER — Ambulatory Visit
Admission: RE | Admit: 2022-08-18 | Discharge: 2022-08-18 | Disposition: A | Discharge status: Home / Self Care | Payer: Medicare HMO | Source: Ambulatory Visit | Attending: Radiation Oncology | Admitting: Radiation Oncology

## 2022-08-18 DIAGNOSIS — Z87891 Personal history of nicotine dependence: Secondary | ICD-10-CM | POA: Diagnosis not present

## 2022-08-18 DIAGNOSIS — Z79899 Other long term (current) drug therapy: Secondary | ICD-10-CM | POA: Diagnosis not present

## 2022-08-18 DIAGNOSIS — Z510 Encounter for antineoplastic radiation therapy: Secondary | ICD-10-CM | POA: Diagnosis not present

## 2022-08-18 DIAGNOSIS — Z5111 Encounter for antineoplastic chemotherapy: Secondary | ICD-10-CM | POA: Diagnosis not present

## 2022-08-18 DIAGNOSIS — C342 Malignant neoplasm of middle lobe, bronchus or lung: Secondary | ICD-10-CM | POA: Diagnosis not present

## 2022-08-18 LAB — RAD ONC ARIA SESSION SUMMARY
Course Elapsed Days: 11
Plan Fractions Treated to Date: 9
Plan Prescribed Dose Per Fraction: 2 Gy
Plan Total Fractions Prescribed: 30
Plan Total Prescribed Dose: 60 Gy
Reference Point Dosage Given to Date: 18 Gy
Reference Point Session Dosage Given: 2 Gy
Session Number: 9

## 2022-08-18 MED ORDER — SUCRALFATE 1 G PO TABS: 1.0000 g | ORAL_TABLET | Freq: Four times a day (QID) | ORAL | 2 refills | Status: DC

## 2022-08-18 MED FILL — Dexamethasone Sodium Phosphate Inj 100 MG/10ML: INTRAMUSCULAR | Qty: 1 | Status: AC

## 2022-08-21 ENCOUNTER — Inpatient Hospital Stay: Payer: Medicare HMO

## 2022-08-21 ENCOUNTER — Other Ambulatory Visit: Payer: Self-pay

## 2022-08-21 ENCOUNTER — Ambulatory Visit
Admission: RE | Admit: 2022-08-21 | Discharge: 2022-08-21 | Disposition: A | Discharge status: Home / Self Care | Payer: Medicare HMO | Source: Ambulatory Visit | Attending: Radiation Oncology | Admitting: Radiation Oncology

## 2022-08-21 VITALS — BP 124/69 | HR 70 | Temp 97.5°F | Resp 20 | Ht 67.0 in | Wt 158.5 lb

## 2022-08-21 DIAGNOSIS — C3491 Malignant neoplasm of unspecified part of right bronchus or lung: Secondary | ICD-10-CM | POA: Insufficient documentation

## 2022-08-21 DIAGNOSIS — C342 Malignant neoplasm of middle lobe, bronchus or lung: Secondary | ICD-10-CM | POA: Insufficient documentation

## 2022-08-21 DIAGNOSIS — Z79899 Other long term (current) drug therapy: Secondary | ICD-10-CM | POA: Insufficient documentation

## 2022-08-21 DIAGNOSIS — N390 Urinary tract infection, site not specified: Secondary | ICD-10-CM | POA: Diagnosis not present

## 2022-08-21 DIAGNOSIS — D72819 Decreased white blood cell count, unspecified: Secondary | ICD-10-CM | POA: Insufficient documentation

## 2022-08-21 DIAGNOSIS — Z5111 Encounter for antineoplastic chemotherapy: Secondary | ICD-10-CM | POA: Insufficient documentation

## 2022-08-21 DIAGNOSIS — Z923 Personal history of irradiation: Secondary | ICD-10-CM | POA: Insufficient documentation

## 2022-08-21 DIAGNOSIS — E860 Dehydration: Secondary | ICD-10-CM | POA: Insufficient documentation

## 2022-08-21 DIAGNOSIS — Z510 Encounter for antineoplastic radiation therapy: Secondary | ICD-10-CM | POA: Diagnosis not present

## 2022-08-21 DIAGNOSIS — Z87891 Personal history of nicotine dependence: Secondary | ICD-10-CM | POA: Diagnosis not present

## 2022-08-21 LAB — CBC WITH DIFFERENTIAL (CANCER CENTER ONLY)
Abs Immature Granulocytes: 0.04 10*3/uL (ref 0.00–0.07)
Basophils Absolute: 0 10*3/uL (ref 0.0–0.1)
Basophils Relative: 0 %
Eosinophils Absolute: 0.1 10*3/uL (ref 0.0–0.5)
Eosinophils Relative: 1 %
HCT: 37.7 % (ref 36.0–46.0)
Hemoglobin: 12.1 g/dL (ref 12.0–15.0)
Immature Granulocytes: 1 %
Lymphocytes Relative: 21 %
Lymphs Abs: 0.9 10*3/uL (ref 0.7–4.0)
MCH: 26.5 pg (ref 26.0–34.0)
MCHC: 32.1 g/dL (ref 30.0–36.0)
MCV: 82.7 fL (ref 80.0–100.0)
Monocytes Absolute: 0.4 10*3/uL (ref 0.1–1.0)
Monocytes Relative: 9 %
Neutro Abs: 3.1 10*3/uL (ref 1.7–7.7)
Neutrophils Relative %: 68 %
Platelet Count: 239 10*3/uL (ref 150–400)
RBC: 4.56 MIL/uL (ref 3.87–5.11)
RDW: 15.1 % (ref 11.5–15.5)
WBC Count: 4.5 10*3/uL (ref 4.0–10.5)
nRBC: 0 % (ref 0.0–0.2)

## 2022-08-21 LAB — RAD ONC ARIA SESSION SUMMARY
Course Elapsed Days: 14
Plan Fractions Treated to Date: 10
Plan Prescribed Dose Per Fraction: 2 Gy
Plan Total Fractions Prescribed: 30
Plan Total Prescribed Dose: 60 Gy
Reference Point Dosage Given to Date: 20 Gy
Reference Point Session Dosage Given: 2 Gy
Session Number: 10

## 2022-08-21 LAB — CMP (CANCER CENTER ONLY)
ALT: 41 U/L (ref 0–44)
AST: 27 U/L (ref 15–41)
Albumin: 3.7 g/dL (ref 3.5–5.0)
Alkaline Phosphatase: 46 U/L (ref 38–126)
Anion gap: 5 (ref 5–15)
BUN: 11 mg/dL (ref 8–23)
CO2: 28 mmol/L (ref 22–32)
Calcium: 8.6 mg/dL — ABNORMAL LOW (ref 8.9–10.3)
Chloride: 104 mmol/L (ref 98–111)
Creatinine: 0.86 mg/dL (ref 0.44–1.00)
GFR, Estimated: >60 mL/min (ref >60)
Glucose, Bld: 92 mg/dL (ref 70–99)
Potassium: 3.8 mmol/L (ref 3.5–5.1)
Sodium: 137 mmol/L (ref 135–145)
Total Bilirubin: 0.3 mg/dL (ref 0.3–1.2)
Total Protein: 6.7 g/dL (ref 6.5–8.1)

## 2022-08-21 MED ORDER — DIPHENHYDRAMINE HCL 50 MG/ML IJ SOLN
50.0000 mg | Freq: Once | INTRAMUSCULAR | Status: AC
  Administered 2022-08-21: 50 mg via INTRAVENOUS
  Filled 2022-08-21: qty 1

## 2022-08-21 MED ORDER — SODIUM CHLORIDE 0.9 % IV SOLN
10.0000 mg | Freq: Once | INTRAVENOUS | Status: AC
  Administered 2022-08-21: 10 mg via INTRAVENOUS
  Filled 2022-08-21: qty 10

## 2022-08-21 MED ORDER — SODIUM CHLORIDE 0.9 % IV SOLN: Freq: Once | INTRAVENOUS | Status: AC

## 2022-08-21 MED ORDER — FAMOTIDINE IN NACL 20-0.9 MG/50ML-% IV SOLN
20.0000 mg | Freq: Once | INTRAVENOUS | Status: AC
  Administered 2022-08-21: 20 mg via INTRAVENOUS
  Filled 2022-08-21: qty 50

## 2022-08-21 MED ORDER — DIPHENHYDRAMINE HCL 50 MG/ML IJ SOLN
INTRAMUSCULAR | Status: AC
  Filled 2022-08-21: qty 1

## 2022-08-21 MED ORDER — PALONOSETRON HCL INJECTION 0.25 MG/5ML
0.2500 mg | Freq: Once | INTRAVENOUS | Status: AC
  Administered 2022-08-21: 0.25 mg via INTRAVENOUS
  Filled 2022-08-21: qty 5

## 2022-08-21 MED ORDER — SODIUM CHLORIDE 0.9 % IV SOLN
166.6000 mg | Freq: Once | INTRAVENOUS | Status: AC
  Administered 2022-08-21: 170 mg via INTRAVENOUS
  Filled 2022-08-21: qty 17

## 2022-08-21 MED ORDER — SODIUM CHLORIDE 0.9 % IV SOLN
45.0000 mg/m2 | Freq: Once | INTRAVENOUS | Status: AC
  Administered 2022-08-21: 84 mg via INTRAVENOUS
  Filled 2022-08-21: qty 14

## 2022-08-21 NOTE — Patient Instructions (Addendum)
[facility]   Discharge Instructions: Thank you for choosing [facility] to provide your oncology and hematology care.   If you have a lab appointment with the Cancer Center, please go directly to the Cancer Center and check in at the registration area.   Wear comfortable clothing and clothing appropriate for easy access to any Portacath or PICC line.   We strive to give you quality time with your provider. You may need to reschedule your appointment if you arrive late (15 or more minutes).  Arriving late affects you and other patients whose appointments are after yours.  Also, if you miss three or more appointments without notifying the office, you may be dismissed from the clinic at the provider's discretion.      For prescription refill requests, have your pharmacy contact our office and allow 72 hours for refills to be completed.    Today you received the following chemotherapy and/or immunotherapy agents: Paclitaxel (Taxol) and Carboplatin       To help prevent nausea and vomiting after your treatment, we encourage you to take your nausea medication as directed.  BELOW ARE SYMPTOMS THAT SHOULD BE REPORTED IMMEDIATELY: *FEVER GREATER THAN 100.4 F (38 C) OR HIGHER *CHILLS OR SWEATING *NAUSEA AND VOMITING THAT IS NOT CONTROLLED WITH YOUR NAUSEA MEDICATION *UNUSUAL SHORTNESS OF BREATH *UNUSUAL BRUISING OR BLEEDING *URINARY PROBLEMS (pain or burning when urinating, or frequent urination) *BOWEL PROBLEMS (unusual diarrhea, constipation, pain near the anus) TENDERNESS IN MOUTH AND THROAT WITH OR WITHOUT PRESENCE OF ULCERS (sore throat, sores in mouth, or a toothache) UNUSUAL RASH, SWELLING OR PAIN  UNUSUAL VAGINAL DISCHARGE OR ITCHING   Items with * indicate a potential emergency and should be followed up as soon as possible or go to the Emergency Department if any problems should occur.  Please show the CHEMOTHERAPY ALERT CARD or  IMMUNOTHERAPY ALERT CARD at check-in to the Emergency Department and triage nurse.  Should you have questions after your visit or need to cancel or reschedule your appointment, please contact [facility]  Dept: [phone]  and follow the prompts.  Office hours are 8:00 a.m. to 4:30 p.m. Monday - Friday. Please note that voicemails left after 4:00 p.m. may not be returned until the following business day.  We are closed weekends and major holidays. You have access to a nurse at all times for urgent questions. Please call the main number to the clinic Dept: [phone] and follow the prompts.   For any non-urgent questions, you may also contact your provider using MyChart. We now offer e-Visits for anyone 18 and older to request care online for non-urgent symptoms. For details visit [email or web address].   Also download the MyChart app! Go to the app store, search "MyChart", open the app, select [facility], and log in with your MyChart username and password.   

## 2022-08-22 ENCOUNTER — Other Ambulatory Visit: Payer: Self-pay

## 2022-08-22 ENCOUNTER — Ambulatory Visit
Admission: RE | Admit: 2022-08-22 | Discharge: 2022-08-22 | Disposition: A | Discharge status: Home / Self Care | Payer: Medicare HMO | Source: Ambulatory Visit | Attending: Radiation Oncology | Admitting: Radiation Oncology

## 2022-08-22 ENCOUNTER — Ambulatory Visit (INDEPENDENT_AMBULATORY_CARE_PROVIDER_SITE_OTHER): Payer: Medicare HMO | Admitting: Physician Assistant

## 2022-08-22 VITALS — BP 118/70 | HR 87 | Temp 97.0°F | Ht 67.0 in | Wt 157.6 lb

## 2022-08-22 DIAGNOSIS — I700 Atherosclerosis of aorta: Secondary | ICD-10-CM

## 2022-08-22 DIAGNOSIS — K219 Gastro-esophageal reflux disease without esophagitis: Secondary | ICD-10-CM | POA: Diagnosis not present

## 2022-08-22 DIAGNOSIS — E782 Mixed hyperlipidemia: Secondary | ICD-10-CM | POA: Diagnosis not present

## 2022-08-22 DIAGNOSIS — Z510 Encounter for antineoplastic radiation therapy: Secondary | ICD-10-CM | POA: Diagnosis not present

## 2022-08-22 DIAGNOSIS — J449 Chronic obstructive pulmonary disease, unspecified: Secondary | ICD-10-CM

## 2022-08-22 DIAGNOSIS — E559 Vitamin D deficiency, unspecified: Secondary | ICD-10-CM | POA: Diagnosis not present

## 2022-08-22 DIAGNOSIS — C3491 Malignant neoplasm of unspecified part of right bronchus or lung: Secondary | ICD-10-CM

## 2022-08-22 DIAGNOSIS — K449 Diaphragmatic hernia without obstruction or gangrene: Secondary | ICD-10-CM | POA: Diagnosis not present

## 2022-08-22 DIAGNOSIS — Z87891 Personal history of nicotine dependence: Secondary | ICD-10-CM | POA: Diagnosis not present

## 2022-08-22 DIAGNOSIS — E860 Dehydration: Secondary | ICD-10-CM | POA: Diagnosis not present

## 2022-08-22 DIAGNOSIS — N390 Urinary tract infection, site not specified: Secondary | ICD-10-CM | POA: Diagnosis not present

## 2022-08-22 DIAGNOSIS — C342 Malignant neoplasm of middle lobe, bronchus or lung: Secondary | ICD-10-CM | POA: Diagnosis not present

## 2022-08-22 DIAGNOSIS — Z5111 Encounter for antineoplastic chemotherapy: Secondary | ICD-10-CM | POA: Diagnosis not present

## 2022-08-22 LAB — RAD ONC ARIA SESSION SUMMARY
Course Elapsed Days: 15
Plan Fractions Treated to Date: 11
Plan Prescribed Dose Per Fraction: 2 Gy
Plan Total Fractions Prescribed: 30
Plan Total Prescribed Dose: 60 Gy
Reference Point Dosage Given to Date: 22 Gy
Reference Point Session Dosage Given: 2 Gy
Session Number: 11

## 2022-08-22 NOTE — Progress Notes (Signed)
Subjective:  Patient ID: [name], female    DOB: Nov 09, 1950  Age: 72 y.o. MRN: [id number]  Chief Complaint  Patient presents with   Medical Management of Chronic Issues    Hyperlipidemia    Pt with GERD and hiatal hernia- symptoms currently stable on omeprazole 20mg  qd - she has recently been started on carafate because she was having some GI issues due to her chemotherapy and radiation treatment  Pt with  lung carcinoma - had a partial lobectomy 03/2020 - The cancer came back and she is currently under treatment from Dr Mitzi Hansen (radiation) and Dr Gwenyth Bouillon (oncology) and is on her second week and will have total of 7 weeks treatment altogether  Pt with  COPD - doing well on Trelegy  Pt with hyperlipidemia and aortic atherosclerosis - currently on crestor 5mg  qd and due for labwork - she states since starting cancer treatment she has not been taking her medication regularly   Current Outpatient Medications on File Prior to Visit  Medication Sig Dispense Refill   benzonatate (TESSALON) 200 MG capsule Take 1 capsule (200 mg total) by mouth 3 (three) times daily as needed for cough. 30 capsule 0   cetirizine (ZYRTEC) 10 MG tablet Take 10 mg by mouth daily.     Coenzyme Q10 (CO Q-10 PO) Take 1 tablet by mouth daily.     cyclobenzaprine (FLEXERIL) 5 MG tablet Take 1 tablet (5 mg total) by mouth 3 (three) times daily as needed for muscle spasms. 30 tablet 1   dexamethasone (DECADRON) 4 MG tablet Take 2 tablets daily for 2 days, start the day after chemotherapy. Take with food. 30 tablet 1   Fluticasone-Umeclidin-Vilant (TRELEGY ELLIPTA) 100-62.5-25 MCG/ACT AEPB Inhale 1 puff into the lungs daily. 2 each 0   Homeopathic Products (LEG CRAMPS) SUBL Place 2 tablets under the tongue daily as needed (leg cramps).     lidocaine-prilocaine (EMLA) cream Apply to affected area once 30 g 3   montelukast (SINGULAIR) 10 MG tablet TAKE 1 TABLET AT BEDTIME 90 tablet 3   Multiple Vitamin (MULTIVITAMIN  ADULT PO) Take 2 tablets by mouth daily.     mupirocin ointment (BACTROBAN) 2 % Apply 1 application topically 2 (two) times daily. (Patient taking differently: Apply 1 application  topically 2 (two) times daily as needed (wound care).) 22 g 2   omeprazole (PRILOSEC) 20 MG capsule TAKE 1 CAPSULE TWICE DAILY BEFORE MEALS 180 capsule 3   ondansetron (ZOFRAN) 8 MG tablet Take 1 tablet (8 mg total) by mouth every 8 (eight) hours as needed for nausea or vomiting. Start on the third day after chemotherapy. 30 tablet 1   polyethylene glycol (MIRALAX / GLYCOLAX) 17 g packet Take 17 g by mouth daily as needed for moderate constipation.     prochlorperazine (COMPAZINE) 10 MG tablet Take 1 tablet (10 mg total) by mouth every 6 (six) hours as needed for nausea or vomiting. 30 tablet 1   rosuvastatin (CRESTOR) 5 MG tablet TAKE 1 TABLET EVERY DAY (Patient taking differently: Take 5 mg by mouth at bedtime.) 90 tablet 3   sucralfate (CARAFATE) 1 g tablet Take 1 tablet (1 g total) by mouth 4 (four) times daily. Dissolve each tablet in 15 cc water before use. 120 tablet 2   VITAMIN D PO Take 2 tablets by mouth daily.     No current facility-administered medications on file prior to visit.   Past Medical History:  Diagnosis Date   Abnormal chest x-ray 01/19/2020  Acute laryngopharyngitis 03/18/2020   Aortic atherosclerosis (HCC) 01/28/2020   Arthritis    Cardiac murmur 03/01/2020   COPD (chronic obstructive pulmonary disease) (HCC)    Coronary artery calcification seen on CT scan 03/01/2020   Cough 01/12/2020   COVID-19 03/10/2020   Dyspnea    on exertion,after getting covid   Family history of adverse reaction to anesthesia    brothr had n/v   GERD (gastroesophageal reflux disease)    Hiatal hernia    Hyperlipemia    Lung cancer, middle lobe (HCC)    middle lobe removed   Pneumonia    PONV (postoperative nausea and vomiting)    Pre-operative cardiovascular examination 03/01/2020   Right lower lobe  lung mass 01/28/2020   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   Visit for screening mammogram 01/28/2020   Past Surgical History:  Procedure Laterality Date   CATARACT EXTRACTION Bilateral 2023   LUNG REMOVAL, PARTIAL  03/29/2020   middle lobe removed   VIDEO BRONCHOSCOPY WITH ENDOBRONCHIAL ULTRASOUND N/A 07/20/2022   Procedure: VIDEO BRONCHOSCOPY WITH ENDOBRONCHIAL ULTRASOUND;  Surgeon: [name], MD;  Location: [facility] OR;  Service: Thoracic;  Laterality: N/A;    Family History  Problem Relation Age of Onset   Heart disease Mother    Heart attack Mother    Cancer Father    Diabetes Father    Non-Hodgkin's lymphoma Brother    High Cholesterol Brother    High Cholesterol Brother    Heart disease Brother    Heart attack Brother    Social History   Socioeconomic History   Marital status: Widowed    Spouse name: Not on file   Number of children: 1   Years of education: Not on file   Highest education level: GED or equivalent  Occupational History   Not on file  Tobacco Use   Smoking status: Former    Years: 50    Types: Cigarettes    Quit date: 03/22/2017    Years since quitting: 5.4   Smokeless tobacco: Never  Vaping Use   Vaping Use: Never used  Substance and Sexual Activity   Alcohol use: Never   Drug use: Never   Sexual activity: Not Currently  Other Topics Concern   Not on file  Social History Narrative   Not on file   Social Determinants of Health   Financial Resource Strain: Medium Risk (08/22/2022)   Overall Financial Resource Strain (CARDIA)    Difficulty of Paying Living Expenses: Somewhat hard  Food Insecurity: No Food Insecurity (08/22/2022)   Hunger Vital Sign    Worried About Running Out of Food in the Last Year: Never true    Ran Out of Food in the Last Year: Never true  Transportation Needs: No Transportation Needs (08/22/2022)   PRAPARE - Administrator, Civil Service (Medical): No    Lack of Transportation  (Non-Medical): No  Physical Activity: Insufficiently Active (08/22/2022)   Exercise Vital Sign    Days of Exercise per Week: 4 days    Minutes of Exercise per Session: 10 min  Stress: No Stress Concern Present (08/22/2022)   [employer] of Occupational Health - Occupational Stress Questionnaire    Feeling of Stress : Not at all  Social Connections: Moderately Isolated (08/22/2022)   Social Connection and Isolation Panel [NHANES]    Frequency of Communication with Friends and Family: More than three times a week    Frequency of Social Gatherings with Friends and Family:  More than three times a week    Attends Religious Services: 1 to 4 times per year    Active Member of Clubs or Organizations: No    Attends Banker Meetings: Never    Marital Status: Widowed  CONSTITUTIONAL: pt has had some current fatigue with chemotherapy treatments E/N/T: Negative for ear pain, nasal congestion and sore throat.  CARDIOVASCULAR: Negative for chest pain, dizziness, palpitations and pedal edema.  RESPIRATORY: Negative for recent cough and dyspnea.  GASTROINTESTINAL: has had some dyspepsia with chemotherapy MSK: Negative for arthralgias and myalgias.  INTEGUMENTARY: Negative for rash.  PSYCHIATRIC: Negative for sleep disturbance and to question depression screen.  Negative for depression, negative for anhedonia.        Objective:  PHYSICAL EXAM:   VS: BP 118/70 (BP Location: Left Arm, Patient Position: Sitting, Cuff Size: Normal)   Pulse 87   Temp (!) 97 F (36.1 C) (Temporal)   Ht 5\' 7"  (1.702 m)   Wt 157 lb 9.6 oz (71.5 kg)   SpO2 97%   BMI 24.68 kg/m   GEN: Well nourished, well developed, in no acute distress  Cardiac: RRR; no murmurs, rubs, or gallops,no edema -  Respiratory:  normal respiratory rate and pattern with no distress - normal breath sounds with no rales, rhonchi, wheezes or rubs  Skin: warm and dry, no rash   Psych: euthymic mood, appropriate affect and  demeanor   Lab Results  Component Value Date   WBC 4.5 08/21/2022   HGB 12.1 08/21/2022   HCT 37.7 08/21/2022   PLT 239 08/21/2022   GLUCOSE 92 08/21/2022   CHOL 206 (H) 12/08/2021   TRIG 232 (H) 12/08/2021   HDL 44 12/08/2021   LDLCALC 121 (H) 12/08/2021   ALT 41 08/21/2022   AST 27 08/21/2022   NA 137 08/21/2022   K 3.8 08/21/2022   CL 104 08/21/2022   CREATININE 0.86 08/21/2022   BUN 11 08/21/2022   CO2 28 08/21/2022   TSH 3.830 06/09/2020   INR 0.9 07/18/2022      Assessment & Plan:   Problem List Items Addressed This Visit       Cardiovascular and Mediastinum   Aortic atherosclerosis (HCC) Continue current meds     Respiratory   Primary lung cancer, right middle lobe - resected Continue follow up with specialists     Other   Hiatal hernia Continue omeprazole 20mg  Continue carafate   Hyperlipemia - Primary   Relevant Orders   CBC with Differential/Platelet   Comprehensive metabolic panel   Lipid panel Continue crestor and watch diet   Other Visit Diagnoses     Vitamin D deficiency       Relevant Orders   VITAMIN D 25 Hydroxy (Vit-D Deficiency, Fractures)      Need for flu vaccine Fluad given                 .  No orders of the defined types were placed in this encounter.   Orders Placed This Encounter  Procedures   CBC with Differential/Platelet   Comprehensive metabolic panel   TSH   Lipid panel   VITAMIN D 25 Hydroxy (Vit-D Deficiency, Fractures)     Follow-up: Return in about 6 months (around 02/22/2023) for chronic fasting follow-up.  An After Visit Summary was printed and given to the patient.  Jettie Pagan [facility] [phone]

## 2022-08-23 ENCOUNTER — Other Ambulatory Visit: Payer: Self-pay

## 2022-08-23 ENCOUNTER — Encounter: Payer: Self-pay | Admitting: Internal Medicine

## 2022-08-23 ENCOUNTER — Ambulatory Visit
Admission: RE | Admit: 2022-08-23 | Discharge: 2022-08-23 | Disposition: A | Discharge status: Home / Self Care | Payer: Medicare HMO | Source: Ambulatory Visit | Attending: Radiation Oncology | Admitting: Radiation Oncology

## 2022-08-23 ENCOUNTER — Encounter: Payer: Self-pay | Admitting: Radiation Oncology

## 2022-08-23 ENCOUNTER — Other Ambulatory Visit: Payer: Self-pay | Admitting: Radiation Oncology

## 2022-08-23 DIAGNOSIS — N390 Urinary tract infection, site not specified: Secondary | ICD-10-CM | POA: Diagnosis not present

## 2022-08-23 DIAGNOSIS — C3491 Malignant neoplasm of unspecified part of right bronchus or lung: Secondary | ICD-10-CM | POA: Diagnosis not present

## 2022-08-23 DIAGNOSIS — E860 Dehydration: Secondary | ICD-10-CM | POA: Diagnosis not present

## 2022-08-23 DIAGNOSIS — Z510 Encounter for antineoplastic radiation therapy: Secondary | ICD-10-CM | POA: Diagnosis not present

## 2022-08-23 DIAGNOSIS — Z87891 Personal history of nicotine dependence: Secondary | ICD-10-CM | POA: Diagnosis not present

## 2022-08-23 DIAGNOSIS — Z5111 Encounter for antineoplastic chemotherapy: Secondary | ICD-10-CM | POA: Diagnosis not present

## 2022-08-23 DIAGNOSIS — C342 Malignant neoplasm of middle lobe, bronchus or lung: Secondary | ICD-10-CM | POA: Diagnosis not present

## 2022-08-23 LAB — COMPREHENSIVE METABOLIC PANEL
ALT: 67 IU/L — ABNORMAL HIGH (ref 0–32)
AST: 46 IU/L — ABNORMAL HIGH (ref 0–40)
Albumin: 4.1 g/dL (ref 3.8–4.8)
Alkaline Phosphatase: 62 IU/L (ref 44–121)
BUN/Creatinine Ratio: 12 (ref 12–28)
BUN: 10 mg/dL (ref 8–27)
Bilirubin Total: 0.4 mg/dL (ref 0.0–1.2)
CO2: 21 mmol/L (ref 20–29)
Calcium: 8.7 mg/dL (ref 8.7–10.3)
Chloride: 102 mmol/L (ref 96–106)
Creatinine, Ser: 0.82 mg/dL (ref 0.57–1.00)
Globulin, Total: 2.5 g/dL (ref 1.5–4.5)
Glucose: 104 mg/dL — ABNORMAL HIGH (ref 70–99)
Potassium: 4.2 mmol/L (ref 3.5–5.2)
Sodium: 139 mmol/L (ref 134–144)
Total Protein: 6.6 g/dL (ref 6.0–8.5)
eGFR: 75 mL/min/{1.73_m2} (ref >59)

## 2022-08-23 LAB — CBC WITH DIFFERENTIAL/PLATELET
Basophils Absolute: 0 10*3/uL (ref 0.0–0.2)
Basos: 0 %
EOS (ABSOLUTE): 0 10*3/uL (ref 0.0–0.4)
Eos: 0 %
Hematocrit: 36.1 % (ref 34.0–46.6)
Hemoglobin: 11.6 g/dL (ref 11.1–15.9)
Immature Grans (Abs): 0 10*3/uL (ref 0.0–0.1)
Immature Granulocytes: 1 %
Lymphocytes Absolute: 0.5 10*3/uL — ABNORMAL LOW (ref 0.7–3.1)
Lymphs: 8 %
MCH: 26.2 pg — ABNORMAL LOW (ref 26.6–33.0)
MCHC: 32.1 g/dL (ref 31.5–35.7)
MCV: 82 fL (ref 79–97)
Monocytes Absolute: 0.4 10*3/uL (ref 0.1–0.9)
Monocytes: 6 %
Neutrophils Absolute: 5.6 10*3/uL (ref 1.4–7.0)
Neutrophils: 85 %
Platelets: 263 10*3/uL (ref 150–450)
RBC: 4.43 x10E6/uL (ref 3.77–5.28)
RDW: 14.7 % (ref 11.7–15.4)
WBC: 6.5 10*3/uL (ref 3.4–10.8)

## 2022-08-23 LAB — LIPID PANEL
Chol/HDL Ratio: 4.8 ratio — ABNORMAL HIGH (ref 0.0–4.4)
Cholesterol, Total: 219 mg/dL — ABNORMAL HIGH (ref 100–199)
HDL: 46 mg/dL (ref >39)
LDL Chol Calc (NIH): 146 mg/dL — ABNORMAL HIGH (ref 0–99)
Triglycerides: 147 mg/dL (ref 0–149)
VLDL Cholesterol Cal: 27 mg/dL (ref 5–40)

## 2022-08-23 LAB — RAD ONC ARIA SESSION SUMMARY
Course Elapsed Days: 16
Plan Fractions Treated to Date: 12
Plan Prescribed Dose Per Fraction: 2 Gy
Plan Total Fractions Prescribed: 30
Plan Total Prescribed Dose: 60 Gy
Reference Point Dosage Given to Date: 24 Gy
Reference Point Session Dosage Given: 2 Gy
Session Number: 12

## 2022-08-23 LAB — TSH: TSH: 0.849 u[IU]/mL (ref 0.450–4.500)

## 2022-08-23 LAB — VITAMIN D 25 HYDROXY (VIT D DEFICIENCY, FRACTURES): Vit D, 25-Hydroxy: 42.5 ng/mL (ref 30.0–100.0)

## 2022-08-23 MED ORDER — HYDROCODONE-ACETAMINOPHEN 7.5-325 MG/15ML PO SOLN: 5.0000 mL | ORAL | 0 refills | Status: DC | PRN

## 2022-08-23 NOTE — Progress Notes (Signed)
This is a Stage III lung cancer pt currently receiving chemoRT and who has received of 12/33 planned treatment. She was started on carafate last week without much relief. Pt has history of hital hernia and is taking omeprazole BID. We discussed why the symptoms were affecting her given her nodes are being treated and that the esophagus is anatomically in close proximity to the lymphatic area being treated. We discussed increasing forms of high protein drinks and a new prescription for Hycet was sent ot her pharmacy if she wants to try this in addition to OTC mylanta prn, and continuing carafate and also using cooking oil to coat her pills and or take po as well. She will follow up with her PUT visit tomorrow as well.

## 2022-08-24 NOTE — Progress Notes (Addendum)
[facility] OFFICE PROGRESS NOTE  [name], PA-C [address] [place] [address]  DIAGNOSIS: Stage IIIA (T2a, N2, M0) non-small cell lung cancer, adenocarcinoma that was initially diagnosed as a stage Ib (T2a, N0, M0) non-small cell lung cancer, adenocarcinoma status post right middle lobectomy on March 29, 2020 and the patient had evidence for disease recurrence in the mediastinal lymph nodes in May 2024.    MOLECULAR STUDY by [facility]:   KRAS G12C mutation [id number] ATRX [id number] R56L   PDL1 Expression 100%  PRIOR THERAPY: Status post right middle lobectomy on March 29, 2020  CURRENT THERAPY: Concurrent chemoradiation with weekly carboplatin for AUC of 2 and paclitaxel 45 Mg/M2. First dose August 07, 2022. Status post 3 cycles.   INTERVAL HISTORY: Karlynn Shugars 72 y.o. female returns to the clinic today for a follow-up visit accompanied by her daughter. The patient was found to have evidence of disease recurrence in May 2024 and she is therefore currently undergoing concurrent chemoradiation.  She is status post 3 weekly doses and is tolerating it fair except she is starting to experience some radiation-induced esophagitis for which she is currently taking Carafate, Prilosec, Hycet, and Mylanta.  Her last day radiation is scheduled for 09/22/2022.  She also had a headache on Saturday which was unusual for her.  Her staging brain MRI was performed recently which not show any evidence of metastatic disease or any acute process.  Her headache eventually went away with Tylenol.  Although she did mention that she is having a hard time swallowing pills due to her esophagitis and is worried that she will not be able to swallow Tylenol in the future.    Regarding her systemic chemotherapy, she is tolerating it fairly well except diarrhea a few days after treatment.  Today she denies any fever, chills, or night sweats. She lost a few pounds due to the  esophagitis. She is drinking fairlife protein drinks once a day.  She is wondering if she can take any B-17 vitamin.  He reports that her breathing is "good".  She denies any chest pain, shortness of breath or hemoptysis.  She does not exert herself and therefore does not have dyspnea on exertion.  She denies any significant cough.  Mild nausea this morning. She is here today for evaluation and repeat blood work before undergoing cycle #4  MEDICAL HISTORY: Past Medical History:  Diagnosis Date   Abnormal chest x-ray 01/19/2020   Acute laryngopharyngitis 03/18/2020   Aortic atherosclerosis (HCC) 01/28/2020   Arthritis    Cardiac murmur 03/01/2020   COPD (chronic obstructive pulmonary disease) (HCC)    Coronary artery calcification seen on CT scan 03/01/2020   Cough 01/12/2020   COVID-19 03/10/2020   Dyspnea    on exertion,after getting covid   Family history of adverse reaction to anesthesia    brothr had n/v   GERD (gastroesophageal reflux disease)    Hiatal hernia    Hyperlipemia    Lung cancer, middle lobe (HCC)    middle lobe removed   Pneumonia    PONV (postoperative nausea and vomiting)    Pre-operative cardiovascular examination 03/01/2020   Right lower lobe lung mass 01/28/2020   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  MEDICATIONS:  Current Outpatient Medications  Medication Sig Dispense Refill   benzonatate (TESSALON) 200 MG capsule Take 1  capsule (200 mg total) by mouth 3 (three) times daily as needed for cough. 30 capsule 0   cetirizine (ZYRTEC) 10 MG tablet Take 10 mg by mouth daily.     Coenzyme Q10 (CO Q-10 PO) Take 1 tablet by mouth daily.     cyclobenzaprine (FLEXERIL) 5 MG tablet Take 1 tablet (5 mg total) by mouth 3 (three) times daily as needed for muscle spasms. 30 tablet 1   dexamethasone (DECADRON) 4 MG tablet Take 2 tablets daily for 2  days, start the day after chemotherapy. Take with food. 30 tablet 1   Fluticasone-Umeclidin-Vilant (TRELEGY ELLIPTA) 100-62.5-25 MCG/ACT AEPB Inhale 1 puff into the lungs daily. 2 each 0   Homeopathic Products (LEG CRAMPS) SUBL Place 2 tablets under the tongue daily as needed (leg cramps).     HYDROcodone-acetaminophen (HYCET) 7.5-325 mg/15 ml solution Take 5-10 mLs by mouth every 4 (four) hours as needed for moderate pain. 120 mL 0   lidocaine-prilocaine (EMLA) cream Apply to affected area once 30 g 3   montelukast (SINGULAIR) 10 MG tablet TAKE 1 TABLET AT BEDTIME 90 tablet 3   Multiple Vitamin (MULTIVITAMIN ADULT PO) Take 2 tablets by mouth daily.     mupirocin ointment (BACTROBAN) 2 % Apply 1 application topically 2 (two) times daily. (Patient taking differently: Apply 1 application  topically 2 (two) times daily as needed (wound care).) 22 g 2   omeprazole (PRILOSEC) 20 MG capsule TAKE 1 CAPSULE TWICE DAILY BEFORE MEALS 180 capsule 3   ondansetron (ZOFRAN) 8 MG tablet Take 1 tablet (8 mg total) by mouth every 8 (eight) hours as needed for nausea or vomiting. Start on the third day after chemotherapy. 30 tablet 1   polyethylene glycol (MIRALAX / GLYCOLAX) 17 g packet Take 17 g by mouth daily as needed for moderate constipation.     prochlorperazine (COMPAZINE) 10 MG tablet Take 1 tablet (10 mg total) by mouth every 6 (six) hours as needed for nausea or vomiting. 30 tablet 1   rosuvastatin (CRESTOR) 5 MG tablet TAKE 1 TABLET EVERY DAY (Patient taking differently: Take 5 mg by mouth at bedtime.) 90 tablet 3   sucralfate (CARAFATE) 1 g tablet Take 1 tablet (1 g total) by mouth 4 (four) times daily. Dissolve each tablet in 15 cc water before use. 120 tablet 2   VITAMIN D PO Take 2 tablets by mouth daily.     No current facility-administered medications for this visit.    SURGICAL HISTORY:  Past Surgical History:  Procedure Laterality Date   CATARACT EXTRACTION Bilateral 2023   LUNG REMOVAL,  PARTIAL  03/29/2020   middle lobe removed   VIDEO BRONCHOSCOPY WITH ENDOBRONCHIAL ULTRASOUND N/A 07/20/2022   Procedure: VIDEO BRONCHOSCOPY WITH ENDOBRONCHIAL ULTRASOUND;  Surgeon: [name], MD;  Location: [facility] OR;  Service: Thoracic;  Laterality: N/A;    REVIEW OF SYSTEMS:   Review of Systems  Constitutional: Positive for weight loss secondary to dysphagia.  Positive for fatigue. Negative for chills and fever.  HENT: Positive for esophagitis. Negative for mouth sores, nosebleeds, sore throat and trouble swallowing.   Eyes: Negative for eye problems and icterus.  Respiratory: Negative for cough, hemoptysis, shortness of breath and wheezing.   Cardiovascular: Negative for chest pain and leg swelling.  Gastrointestinal: Mild nausea this AM. Negative for abdominal pain, constipation, diarrhea (has diarrhea after treatment but none at this time), and vomiting.  Genitourinary: Negative for bladder incontinence, difficulty urinating, dysuria, frequency and hematuria.   Musculoskeletal: Negative for  back pain, gait problem, neck pain and neck stiffness.  Skin: Negative for itching and rash.  Neurological: Negative for dizziness, extremity weakness, gait problem, headaches (resolved) , light-headedness and seizures.  Hematological: Negative for adenopathy. Does not bruise/bleed easily.  Psychiatric/Behavioral: Negative for confusion, depression and sleep disturbance. The patient is not nervous/anxious.     PHYSICAL EXAMINATION:  Blood pressure (!) 132/57, pulse 96, temperature (!) 97.5 F (36.4 C), temperature source Oral, resp. rate 18, weight 157 lb 14.4 oz (71.6 kg), SpO2 100 %.  ECOG PERFORMANCE STATUS: 1  Physical Exam  Constitutional: Oriented to person, place, and time and well-developed, well-nourished, and in no distress.  HENT:  Head: Normocephalic and atraumatic.  Mouth/Throat: Oropharynx is clear and moist. No oropharyngeal exudate.  Eyes: Conjunctivae are normal. Right  eye exhibits no discharge. Left eye exhibits no discharge. No scleral icterus.  Neck: Normal range of motion. Neck supple.  Cardiovascular: Normal rate, regular rhythm, normal heart sounds and intact distal pulses.   Pulmonary/Chest: Effort normal and breath sounds normal. No respiratory distress. No wheezes. No rales.  Abdominal: Soft. Bowel sounds are normal. Exhibits no distension and no mass. There is no tenderness.  Musculoskeletal: Normal range of motion. Exhibits no edema.  Lymphadenopathy:    No cervical adenopathy.  Neurological: Alert and oriented to person, place, and time. Exhibits normal muscle tone. Gait normal. Coordination normal.  Skin: Skin is warm and dry. No rash noted. Not diaphoretic. No erythema. No pallor.  Psychiatric: Mood, memory and judgment normal.  Vitals reviewed.  LABORATORY DATA: Lab Results  Component Value Date   WBC 3.6 (L) 08/28/2022   HGB 13.2 08/28/2022   HCT 41.2 08/28/2022   MCV 84.3 08/28/2022   PLT 178 08/28/2022      Chemistry      Component Value Date/Time   NA 137 08/28/2022 1035   NA 139 08/22/2022 1146   K 3.8 08/28/2022 1035   CL 103 08/28/2022 1035   CO2 26 08/28/2022 1035   BUN 15 08/28/2022 1035   BUN 10 08/22/2022 1146   CREATININE 0.90 08/28/2022 1035      Component Value Date/Time   CALCIUM 9.2 08/28/2022 1035   ALKPHOS 49 08/28/2022 1035   AST 29 08/28/2022 1035   ALT 43 08/28/2022 1035   BILITOT 0.3 08/28/2022 1035       RADIOGRAPHIC STUDIES:  MR Brain W Wo Contrast  Result Date: 08/15/2022 CLINICAL DATA:  Metastatic disease evaluation, malignant neoplasm of the lung EXAM: MRI HEAD WITHOUT AND WITH CONTRAST TECHNIQUE: Multiplanar, multiecho pulse sequences of the brain and surrounding structures were obtained without and with intravenous contrast. CONTRAST:  7 mL Vueway COMPARISON:  None Available. FINDINGS: Brain: No restricted diffusion to suggest acute or subacute infarct. No abnormal parenchymal or meningeal  enhancement. No acute hemorrhage, mass, mass effect, or midline shift. No hydrocephalus or extra-axial collection. Normal pituitary and craniocervical junction. No hemosiderin deposition to suggest remote hemorrhage. Normal cerebral volume for age. Scattered T2 hyperintense signal in the periventricular white matter, likely the sequela of mild chronic small vessel ischemic disease. Vascular: Normal arterial flow voids. Normal arterial and venous enhancement. Incidental note is made of a high riding left jugular bulb. Skull and upper cervical spine: Normal marrow signal. Sinuses/Orbits: Clear paranasal sinuses. No acute finding in the orbits. Status post bilateral lens replacements. Other: Trace fluid in left mastoid air cells. IMPRESSION: No acute intracranial process. No evidence of metastatic disease in the brain. Electronically Signed   By: Jill Side  Vasan M.D.   On: 08/15/2022 13:29     ASSESSMENT/PLAN:  This is a very pleasant 72 year old Caucasian female with  stage IIIA (T2a, N2, M0) non-small cell lung cancer, adenocarcinoma that was initially diagnosed as a stage Ib (T2a, N0, M0) non-small cell lung cancer, adenocarcinoma status post right middle lobectomy on March 29, 2020 and the patient had evidence for disease recurrence in the mediastinal lymph nodes in May 2024.  Molecular studies by [facility] showed positive KRAS G12C mutation and PD-L1 expression of 100%.  She was found to have evidence of disease progression in May 2024.  Therefore she is currently undergoing a course of concurrent chemoradiation with weekly carboplatin for an AUC of 2 and paclitaxel 45 mg/m.  She is status post 3 cycles and is tolerating it well.  She is having some difficulties with some esophagitis for which she is currently taking Carafate, Prilosec, Hycet, and Mylanta.  Labs were reviewed.  Recommend that she proceed with cycle #4 today scheduled.  We will see her back for follow-up visit in 2 weeks for  evaluation repeat blood work before undergoing cycle #6.  Advised to avoid any over-the-counter herbal supplements while undergoing treatment.  She is worried if she needs Tylenol in the future, that she will have difficulty swallowing this.  We talked about taking liquid Tylenol.  Should she develop any diarrhea following treatment, she is advised to take Imodium  We talked about her weight loss.  She drinks fair life drinks once a day.  Encouraged her to eat make her own protein supplemental drinks with extra protein powder and what ever she likes in a blender, if she does not like what is sold commercially.  She received a bill from [employer]. We will reach out to the rep regarding this to ensure that it is covered as that is standard with adenocarcinoma to assess for actionable mutations in patients with stage III/IV disease.   The patient was advised to call immediately if she has any concerning symptoms in the interval. The patient voices understanding of current disease status and treatment options and is in agreement with the current care plan. All questions were answered. The patient knows to call the clinic with any problems, questions or concerns. We can certainly see the patient much sooner if necessary        No orders of the defined types were placed in this encounter.    The total time spent in the appointment was 20-29 minutes  Jhamal Plucinski L Larone Kliethermes, PA-C 08/28/22

## 2022-08-25 ENCOUNTER — Other Ambulatory Visit: Payer: Self-pay

## 2022-08-25 ENCOUNTER — Ambulatory Visit
Admission: RE | Admit: 2022-08-25 | Discharge: 2022-08-25 | Disposition: A | Discharge status: Home / Self Care | Payer: Medicare HMO | Source: Ambulatory Visit | Attending: Radiation Oncology | Admitting: Radiation Oncology

## 2022-08-25 DIAGNOSIS — Z5111 Encounter for antineoplastic chemotherapy: Secondary | ICD-10-CM | POA: Diagnosis not present

## 2022-08-25 DIAGNOSIS — Z510 Encounter for antineoplastic radiation therapy: Secondary | ICD-10-CM | POA: Diagnosis not present

## 2022-08-25 DIAGNOSIS — N390 Urinary tract infection, site not specified: Secondary | ICD-10-CM | POA: Diagnosis not present

## 2022-08-25 DIAGNOSIS — E860 Dehydration: Secondary | ICD-10-CM | POA: Diagnosis not present

## 2022-08-25 DIAGNOSIS — Z87891 Personal history of nicotine dependence: Secondary | ICD-10-CM | POA: Diagnosis not present

## 2022-08-25 DIAGNOSIS — C342 Malignant neoplasm of middle lobe, bronchus or lung: Secondary | ICD-10-CM | POA: Diagnosis not present

## 2022-08-25 DIAGNOSIS — C3491 Malignant neoplasm of unspecified part of right bronchus or lung: Secondary | ICD-10-CM | POA: Diagnosis not present

## 2022-08-25 LAB — RAD ONC ARIA SESSION SUMMARY
Course Elapsed Days: 18
Plan Fractions Treated to Date: 13
Plan Prescribed Dose Per Fraction: 2 Gy
Plan Total Fractions Prescribed: 30
Plan Total Prescribed Dose: 60 Gy
Reference Point Dosage Given to Date: 26 Gy
Reference Point Session Dosage Given: 2 Gy
Session Number: 13

## 2022-08-25 MED FILL — Dexamethasone Sodium Phosphate Inj 100 MG/10ML: INTRAMUSCULAR | Qty: 1 | Status: AC

## 2022-08-28 ENCOUNTER — Ambulatory Visit
Admission: RE | Admit: 2022-08-28 | Discharge: 2022-08-28 | Disposition: A | Discharge status: Home / Self Care | Payer: Medicare HMO | Source: Ambulatory Visit | Attending: Radiation Oncology | Admitting: Radiation Oncology

## 2022-08-28 ENCOUNTER — Other Ambulatory Visit: Payer: Self-pay

## 2022-08-28 ENCOUNTER — Inpatient Hospital Stay: Payer: Medicare HMO | Admitting: Physician Assistant

## 2022-08-28 ENCOUNTER — Inpatient Hospital Stay: Payer: Medicare HMO

## 2022-08-28 VITALS — BP 119/58 | HR 74 | Resp 18

## 2022-08-28 VITALS — BP 132/57 | HR 96 | Temp 97.5°F | Resp 18 | Wt 157.9 lb

## 2022-08-28 DIAGNOSIS — C3491 Malignant neoplasm of unspecified part of right bronchus or lung: Secondary | ICD-10-CM

## 2022-08-28 DIAGNOSIS — Z5111 Encounter for antineoplastic chemotherapy: Secondary | ICD-10-CM

## 2022-08-28 DIAGNOSIS — Z87891 Personal history of nicotine dependence: Secondary | ICD-10-CM | POA: Diagnosis not present

## 2022-08-28 DIAGNOSIS — C342 Malignant neoplasm of middle lobe, bronchus or lung: Secondary | ICD-10-CM | POA: Diagnosis not present

## 2022-08-28 DIAGNOSIS — N390 Urinary tract infection, site not specified: Secondary | ICD-10-CM | POA: Diagnosis not present

## 2022-08-28 DIAGNOSIS — E860 Dehydration: Secondary | ICD-10-CM | POA: Diagnosis not present

## 2022-08-28 DIAGNOSIS — Z510 Encounter for antineoplastic radiation therapy: Secondary | ICD-10-CM | POA: Diagnosis not present

## 2022-08-28 LAB — RAD ONC ARIA SESSION SUMMARY
Course Elapsed Days: 21
Plan Fractions Treated to Date: 14
Plan Prescribed Dose Per Fraction: 2 Gy
Plan Total Fractions Prescribed: 30
Plan Total Prescribed Dose: 60 Gy
Reference Point Dosage Given to Date: 28 Gy
Reference Point Session Dosage Given: 2 Gy
Session Number: 14

## 2022-08-28 LAB — CBC WITH DIFFERENTIAL (CANCER CENTER ONLY)
Abs Immature Granulocytes: 0.03 10*3/uL (ref 0.00–0.07)
Basophils Absolute: 0 10*3/uL (ref 0.0–0.1)
Basophils Relative: 1 %
Eosinophils Absolute: 0 10*3/uL (ref 0.0–0.5)
Eosinophils Relative: 1 %
HCT: 41.2 % (ref 36.0–46.0)
Hemoglobin: 13.2 g/dL (ref 12.0–15.0)
Immature Granulocytes: 1 %
Lymphocytes Relative: 17 %
Lymphs Abs: 0.6 10*3/uL — ABNORMAL LOW (ref 0.7–4.0)
MCH: 27 pg (ref 26.0–34.0)
MCHC: 32 g/dL (ref 30.0–36.0)
MCV: 84.3 fL (ref 80.0–100.0)
Monocytes Absolute: 0.3 10*3/uL (ref 0.1–1.0)
Monocytes Relative: 7 %
Neutro Abs: 2.7 10*3/uL (ref 1.7–7.7)
Neutrophils Relative %: 73 %
Platelet Count: 178 10*3/uL (ref 150–400)
RBC: 4.89 MIL/uL (ref 3.87–5.11)
RDW: 16.3 % — ABNORMAL HIGH (ref 11.5–15.5)
WBC Count: 3.6 10*3/uL — ABNORMAL LOW (ref 4.0–10.5)
nRBC: 0 % (ref 0.0–0.2)

## 2022-08-28 LAB — CMP (CANCER CENTER ONLY)
ALT: 43 U/L (ref 0–44)
AST: 29 U/L (ref 15–41)
Albumin: 3.7 g/dL (ref 3.5–5.0)
Alkaline Phosphatase: 49 U/L (ref 38–126)
Anion gap: 8 (ref 5–15)
BUN: 15 mg/dL (ref 8–23)
CO2: 26 mmol/L (ref 22–32)
Calcium: 9.2 mg/dL (ref 8.9–10.3)
Chloride: 103 mmol/L (ref 98–111)
Creatinine: 0.9 mg/dL (ref 0.44–1.00)
GFR, Estimated: >60 mL/min (ref >60)
Glucose, Bld: 99 mg/dL (ref 70–99)
Potassium: 3.8 mmol/L (ref 3.5–5.1)
Sodium: 137 mmol/L (ref 135–145)
Total Bilirubin: 0.3 mg/dL (ref 0.3–1.2)
Total Protein: 6.9 g/dL (ref 6.5–8.1)

## 2022-08-28 MED ORDER — SODIUM CHLORIDE 0.9 % IV SOLN
10.0000 mg | Freq: Once | INTRAVENOUS | Status: AC
  Administered 2022-08-28: 10 mg via INTRAVENOUS
  Filled 2022-08-28: qty 10

## 2022-08-28 MED ORDER — SODIUM CHLORIDE 0.9 % IV SOLN
45.0000 mg/m2 | Freq: Once | INTRAVENOUS | Status: AC
  Administered 2022-08-28: 84 mg via INTRAVENOUS
  Filled 2022-08-28: qty 14

## 2022-08-28 MED ORDER — SODIUM CHLORIDE 0.9 % IV SOLN
166.6000 mg | Freq: Once | INTRAVENOUS | Status: AC
  Administered 2022-08-28: 170 mg via INTRAVENOUS
  Filled 2022-08-28: qty 17

## 2022-08-28 MED ORDER — PALONOSETRON HCL INJECTION 0.25 MG/5ML
0.2500 mg | Freq: Once | INTRAVENOUS | Status: AC
  Administered 2022-08-28: 0.25 mg via INTRAVENOUS
  Filled 2022-08-28: qty 5

## 2022-08-28 MED ORDER — FAMOTIDINE IN NACL 20-0.9 MG/50ML-% IV SOLN
20.0000 mg | Freq: Once | INTRAVENOUS | Status: AC
  Administered 2022-08-28: 20 mg via INTRAVENOUS
  Filled 2022-08-28: qty 50

## 2022-08-28 MED ORDER — SODIUM CHLORIDE 0.9 % IV SOLN: Freq: Once | INTRAVENOUS | Status: AC

## 2022-08-28 MED ORDER — DIPHENHYDRAMINE HCL 50 MG/ML IJ SOLN
50.0000 mg | Freq: Once | INTRAMUSCULAR | Status: AC
  Administered 2022-08-28: 50 mg via INTRAVENOUS
  Filled 2022-08-28: qty 1

## 2022-08-28 NOTE — Patient Instructions (Signed)
[facility]   Discharge Instructions: Thank you for choosing [facility] to provide your oncology and hematology care.   If you have a lab appointment with the Cancer Center, please go directly to the Cancer Center and check in at the registration area.   Wear comfortable clothing and clothing appropriate for easy access to any Portacath or PICC line.   We strive to give you quality time with your provider. You may need to reschedule your appointment if you arrive late (15 or more minutes).  Arriving late affects you and other patients whose appointments are after yours.  Also, if you miss three or more appointments without notifying the office, you may be dismissed from the clinic at the provider's discretion.      For prescription refill requests, have your pharmacy contact our office and allow 72 hours for refills to be completed.    Today you received the following chemotherapy and/or immunotherapy agents: Paclitaxel (Taxol) and Carboplatin       To help prevent nausea and vomiting after your treatment, we encourage you to take your nausea medication as directed.  BELOW ARE SYMPTOMS THAT SHOULD BE REPORTED IMMEDIATELY: *FEVER GREATER THAN 100.4 F (38 C) OR HIGHER *CHILLS OR SWEATING *NAUSEA AND VOMITING THAT IS NOT CONTROLLED WITH YOUR NAUSEA MEDICATION *UNUSUAL SHORTNESS OF BREATH *UNUSUAL BRUISING OR BLEEDING *URINARY PROBLEMS (pain or burning when urinating, or frequent urination) *BOWEL PROBLEMS (unusual diarrhea, constipation, pain near the anus) TENDERNESS IN MOUTH AND THROAT WITH OR WITHOUT PRESENCE OF ULCERS (sore throat, sores in mouth, or a toothache) UNUSUAL RASH, SWELLING OR PAIN  UNUSUAL VAGINAL DISCHARGE OR ITCHING   Items with * indicate a potential emergency and should be followed up as soon as possible or go to the Emergency Department if any problems should occur.  Please show the CHEMOTHERAPY ALERT CARD or  IMMUNOTHERAPY ALERT CARD at check-in to the Emergency Department and triage nurse.  Should you have questions after your visit or need to cancel or reschedule your appointment, please contact [facility]  Dept: [phone]  and follow the prompts.  Office hours are 8:00 a.m. to 4:30 p.m. Monday - Friday. Please note that voicemails left after 4:00 p.m. may not be returned until the following business day.  We are closed weekends and major holidays. You have access to a nurse at all times for urgent questions. Please call the main number to the clinic Dept: [phone] and follow the prompts.   For any non-urgent questions, you may also contact your provider using MyChart. We now offer e-Visits for anyone 18 and older to request care online for non-urgent symptoms. For details visit [email or web address].   Also download the MyChart app! Go to the app store, search "MyChart", open the app, select [facility], and log in with your MyChart username and password.   

## 2022-08-29 ENCOUNTER — Other Ambulatory Visit: Payer: Self-pay

## 2022-08-29 ENCOUNTER — Ambulatory Visit
Admission: RE | Admit: 2022-08-29 | Discharge: 2022-08-29 | Disposition: A | Discharge status: Home / Self Care | Payer: Medicare HMO | Source: Ambulatory Visit | Attending: Radiation Oncology | Admitting: Radiation Oncology

## 2022-08-29 DIAGNOSIS — C342 Malignant neoplasm of middle lobe, bronchus or lung: Secondary | ICD-10-CM | POA: Diagnosis not present

## 2022-08-29 DIAGNOSIS — E860 Dehydration: Secondary | ICD-10-CM | POA: Diagnosis not present

## 2022-08-29 DIAGNOSIS — C3491 Malignant neoplasm of unspecified part of right bronchus or lung: Secondary | ICD-10-CM | POA: Diagnosis not present

## 2022-08-29 DIAGNOSIS — N390 Urinary tract infection, site not specified: Secondary | ICD-10-CM | POA: Diagnosis not present

## 2022-08-29 DIAGNOSIS — Z87891 Personal history of nicotine dependence: Secondary | ICD-10-CM | POA: Diagnosis not present

## 2022-08-29 DIAGNOSIS — Z510 Encounter for antineoplastic radiation therapy: Secondary | ICD-10-CM | POA: Diagnosis not present

## 2022-08-29 DIAGNOSIS — Z5111 Encounter for antineoplastic chemotherapy: Secondary | ICD-10-CM | POA: Diagnosis not present

## 2022-08-29 LAB — RAD ONC ARIA SESSION SUMMARY
Course Elapsed Days: 22
Plan Fractions Treated to Date: 15
Plan Prescribed Dose Per Fraction: 2 Gy
Plan Total Fractions Prescribed: 30
Plan Total Prescribed Dose: 60 Gy
Reference Point Dosage Given to Date: 30 Gy
Reference Point Session Dosage Given: 2 Gy
Session Number: 15

## 2022-08-30 ENCOUNTER — Other Ambulatory Visit: Payer: Self-pay

## 2022-08-30 ENCOUNTER — Ambulatory Visit
Admission: RE | Admit: 2022-08-30 | Discharge: 2022-08-30 | Disposition: A | Discharge status: Home / Self Care | Payer: Medicare HMO | Source: Ambulatory Visit | Attending: Radiation Oncology | Admitting: Radiation Oncology

## 2022-08-30 DIAGNOSIS — E860 Dehydration: Secondary | ICD-10-CM | POA: Diagnosis not present

## 2022-08-30 DIAGNOSIS — C3491 Malignant neoplasm of unspecified part of right bronchus or lung: Secondary | ICD-10-CM | POA: Diagnosis not present

## 2022-08-30 DIAGNOSIS — N390 Urinary tract infection, site not specified: Secondary | ICD-10-CM | POA: Diagnosis not present

## 2022-08-30 DIAGNOSIS — Z5111 Encounter for antineoplastic chemotherapy: Secondary | ICD-10-CM | POA: Diagnosis not present

## 2022-08-30 DIAGNOSIS — C342 Malignant neoplasm of middle lobe, bronchus or lung: Secondary | ICD-10-CM | POA: Diagnosis not present

## 2022-08-30 DIAGNOSIS — Z87891 Personal history of nicotine dependence: Secondary | ICD-10-CM | POA: Diagnosis not present

## 2022-08-30 DIAGNOSIS — Z510 Encounter for antineoplastic radiation therapy: Secondary | ICD-10-CM | POA: Diagnosis not present

## 2022-08-30 LAB — RAD ONC ARIA SESSION SUMMARY
Course Elapsed Days: 23
Plan Fractions Treated to Date: 16
Plan Prescribed Dose Per Fraction: 2 Gy
Plan Total Fractions Prescribed: 30
Plan Total Prescribed Dose: 60 Gy
Reference Point Dosage Given to Date: 32 Gy
Reference Point Session Dosage Given: 2 Gy
Session Number: 16

## 2022-08-31 ENCOUNTER — Telehealth: Payer: Self-pay | Admitting: Medical Oncology

## 2022-08-31 ENCOUNTER — Ambulatory Visit
Admission: RE | Admit: 2022-08-31 | Discharge: 2022-08-31 | Disposition: A | Discharge status: Home / Self Care | Payer: Medicare HMO | Source: Ambulatory Visit | Attending: Radiation Oncology | Admitting: Radiation Oncology

## 2022-08-31 ENCOUNTER — Other Ambulatory Visit: Payer: Self-pay

## 2022-08-31 DIAGNOSIS — Z510 Encounter for antineoplastic radiation therapy: Secondary | ICD-10-CM | POA: Diagnosis not present

## 2022-08-31 DIAGNOSIS — N390 Urinary tract infection, site not specified: Secondary | ICD-10-CM | POA: Diagnosis not present

## 2022-08-31 DIAGNOSIS — E860 Dehydration: Secondary | ICD-10-CM | POA: Diagnosis not present

## 2022-08-31 DIAGNOSIS — Z5111 Encounter for antineoplastic chemotherapy: Secondary | ICD-10-CM | POA: Diagnosis not present

## 2022-08-31 DIAGNOSIS — Z87891 Personal history of nicotine dependence: Secondary | ICD-10-CM | POA: Diagnosis not present

## 2022-08-31 DIAGNOSIS — C3491 Malignant neoplasm of unspecified part of right bronchus or lung: Secondary | ICD-10-CM | POA: Diagnosis not present

## 2022-08-31 DIAGNOSIS — C342 Malignant neoplasm of middle lobe, bronchus or lung: Secondary | ICD-10-CM | POA: Diagnosis not present

## 2022-08-31 LAB — RAD ONC ARIA SESSION SUMMARY
Course Elapsed Days: 24
Plan Fractions Treated to Date: 17
Plan Prescribed Dose Per Fraction: 2 Gy
Plan Total Fractions Prescribed: 30
Plan Total Prescribed Dose: 60 Gy
Reference Point Dosage Given to Date: 34 Gy
Reference Point Session Dosage Given: 2 Gy
Session Number: 17

## 2022-08-31 NOTE — Telephone Encounter (Signed)
I LVM that her Claims Summary she gave me was sent to be scanned into her chart . I told her if she had a secondary insurance to check to see if it can be used to pay the amount that [employer] stated is her share.

## 2022-09-01 ENCOUNTER — Other Ambulatory Visit: Payer: Self-pay

## 2022-09-01 ENCOUNTER — Ambulatory Visit
Admission: RE | Admit: 2022-09-01 | Discharge: 2022-09-01 | Disposition: A | Discharge status: Home / Self Care | Payer: Medicare HMO | Source: Ambulatory Visit | Attending: Radiation Oncology | Admitting: Radiation Oncology

## 2022-09-01 ENCOUNTER — Ambulatory Visit: Admission: RE | Admit: 2022-09-01 | Payer: Medicare HMO | Source: Ambulatory Visit

## 2022-09-01 DIAGNOSIS — Z5111 Encounter for antineoplastic chemotherapy: Secondary | ICD-10-CM | POA: Diagnosis not present

## 2022-09-01 DIAGNOSIS — N390 Urinary tract infection, site not specified: Secondary | ICD-10-CM | POA: Diagnosis not present

## 2022-09-01 DIAGNOSIS — C342 Malignant neoplasm of middle lobe, bronchus or lung: Secondary | ICD-10-CM | POA: Diagnosis not present

## 2022-09-01 DIAGNOSIS — E860 Dehydration: Secondary | ICD-10-CM | POA: Diagnosis not present

## 2022-09-01 DIAGNOSIS — C3491 Malignant neoplasm of unspecified part of right bronchus or lung: Secondary | ICD-10-CM | POA: Diagnosis not present

## 2022-09-01 DIAGNOSIS — Z510 Encounter for antineoplastic radiation therapy: Secondary | ICD-10-CM | POA: Diagnosis not present

## 2022-09-01 DIAGNOSIS — Z87891 Personal history of nicotine dependence: Secondary | ICD-10-CM | POA: Diagnosis not present

## 2022-09-01 LAB — RAD ONC ARIA SESSION SUMMARY
Course Elapsed Days: 25
Plan Fractions Treated to Date: 18
Plan Prescribed Dose Per Fraction: 2 Gy
Plan Total Fractions Prescribed: 30
Plan Total Prescribed Dose: 60 Gy
Reference Point Dosage Given to Date: 36 Gy
Reference Point Session Dosage Given: 2 Gy
Session Number: 18

## 2022-09-01 MED FILL — Dexamethasone Sodium Phosphate Inj 100 MG/10ML: INTRAMUSCULAR | Qty: 1 | Status: AC

## 2022-09-04 ENCOUNTER — Ambulatory Visit
Admission: RE | Admit: 2022-09-04 | Discharge: 2022-09-04 | Disposition: A | Discharge status: Home / Self Care | Payer: Medicare HMO | Source: Ambulatory Visit | Attending: Radiation Oncology | Admitting: Radiation Oncology

## 2022-09-04 ENCOUNTER — Inpatient Hospital Stay: Payer: Medicare HMO

## 2022-09-04 ENCOUNTER — Other Ambulatory Visit: Payer: Self-pay

## 2022-09-04 VITALS — BP 121/74 | HR 100 | Temp 97.5°F | Resp 20 | Ht 67.0 in | Wt 158.5 lb

## 2022-09-04 DIAGNOSIS — C3491 Malignant neoplasm of unspecified part of right bronchus or lung: Secondary | ICD-10-CM

## 2022-09-04 DIAGNOSIS — E860 Dehydration: Secondary | ICD-10-CM | POA: Diagnosis not present

## 2022-09-04 DIAGNOSIS — N390 Urinary tract infection, site not specified: Secondary | ICD-10-CM | POA: Diagnosis not present

## 2022-09-04 DIAGNOSIS — C342 Malignant neoplasm of middle lobe, bronchus or lung: Secondary | ICD-10-CM | POA: Diagnosis not present

## 2022-09-04 DIAGNOSIS — Z87891 Personal history of nicotine dependence: Secondary | ICD-10-CM | POA: Diagnosis not present

## 2022-09-04 DIAGNOSIS — Z5111 Encounter for antineoplastic chemotherapy: Secondary | ICD-10-CM | POA: Diagnosis not present

## 2022-09-04 DIAGNOSIS — Z510 Encounter for antineoplastic radiation therapy: Secondary | ICD-10-CM | POA: Diagnosis not present

## 2022-09-04 LAB — URINALYSIS, ROUTINE W REFLEX MICROSCOPIC
Bilirubin Urine: NEGATIVE
Glucose, UA: NEGATIVE mg/dL
Hgb urine dipstick: NEGATIVE
Ketones, ur: NEGATIVE mg/dL
Nitrite: POSITIVE — AB
Protein, ur: NEGATIVE mg/dL
Specific Gravity, Urine: 1.014 (ref 1.005–1.030)
WBC, UA: >50 WBC/hpf (ref 0–5)
pH: 7 (ref 5.0–8.0)

## 2022-09-04 LAB — RAD ONC ARIA SESSION SUMMARY
Course Elapsed Days: 28
Plan Fractions Treated to Date: 19
Plan Prescribed Dose Per Fraction: 2 Gy
Plan Total Fractions Prescribed: 30
Plan Total Prescribed Dose: 60 Gy
Reference Point Dosage Given to Date: 38 Gy
Reference Point Session Dosage Given: 2 Gy
Session Number: 19

## 2022-09-04 LAB — CBC WITH DIFFERENTIAL (CANCER CENTER ONLY)
Abs Immature Granulocytes: 0.02 10*3/uL (ref 0.00–0.07)
Basophils Absolute: 0 10*3/uL (ref 0.0–0.1)
Basophils Relative: 1 %
Eosinophils Absolute: 0 10*3/uL (ref 0.0–0.5)
Eosinophils Relative: 1 %
HCT: 37.4 % (ref 36.0–46.0)
Hemoglobin: 12.5 g/dL (ref 12.0–15.0)
Immature Granulocytes: 1 %
Lymphocytes Relative: 21 %
Lymphs Abs: 0.6 10*3/uL — ABNORMAL LOW (ref 0.7–4.0)
MCH: 27.3 pg (ref 26.0–34.0)
MCHC: 33.4 g/dL (ref 30.0–36.0)
MCV: 81.7 fL (ref 80.0–100.0)
Monocytes Absolute: 0.2 10*3/uL (ref 0.1–1.0)
Monocytes Relative: 8 %
Neutro Abs: 1.8 10*3/uL (ref 1.7–7.7)
Neutrophils Relative %: 68 %
Platelet Count: 143 10*3/uL — ABNORMAL LOW (ref 150–400)
RBC: 4.58 MIL/uL (ref 3.87–5.11)
RDW: 17.3 % — ABNORMAL HIGH (ref 11.5–15.5)
WBC Count: 2.7 10*3/uL — ABNORMAL LOW (ref 4.0–10.5)
nRBC: 0 % (ref 0.0–0.2)

## 2022-09-04 LAB — CMP (CANCER CENTER ONLY)
ALT: 49 U/L — ABNORMAL HIGH (ref 0–44)
AST: 31 U/L (ref 15–41)
Albumin: 3.7 g/dL (ref 3.5–5.0)
Alkaline Phosphatase: 47 U/L (ref 38–126)
Anion gap: 8 (ref 5–15)
BUN: 13 mg/dL (ref 8–23)
CO2: 25 mmol/L (ref 22–32)
Calcium: 9 mg/dL (ref 8.9–10.3)
Chloride: 103 mmol/L (ref 98–111)
Creatinine: 1.05 mg/dL — ABNORMAL HIGH (ref 0.44–1.00)
GFR, Estimated: 56 mL/min — ABNORMAL LOW (ref >60)
Glucose, Bld: 124 mg/dL — ABNORMAL HIGH (ref 70–99)
Potassium: 3.7 mmol/L (ref 3.5–5.1)
Sodium: 136 mmol/L (ref 135–145)
Total Bilirubin: 0.4 mg/dL (ref 0.3–1.2)
Total Protein: 6.5 g/dL (ref 6.5–8.1)

## 2022-09-04 MED ORDER — SODIUM CHLORIDE 0.9% FLUSH: 10.0000 mL | INTRAVENOUS | Status: DC | PRN

## 2022-09-04 MED ORDER — SODIUM CHLORIDE 0.9 % IV SOLN
10.0000 mg | Freq: Once | INTRAVENOUS | Status: AC
  Administered 2022-09-04: 10 mg via INTRAVENOUS
  Filled 2022-09-04: qty 10

## 2022-09-04 MED ORDER — SODIUM CHLORIDE 0.9 % IV SOLN
45.0000 mg/m2 | Freq: Once | INTRAVENOUS | Status: AC
  Administered 2022-09-04: 84 mg via INTRAVENOUS
  Filled 2022-09-04: qty 14

## 2022-09-04 MED ORDER — SODIUM CHLORIDE 0.9 % IV SOLN: Freq: Once | INTRAVENOUS | Status: AC

## 2022-09-04 MED ORDER — HEPARIN SOD (PORK) LOCK FLUSH 100 UNIT/ML IV SOLN: 500.0000 [IU] | Freq: Once | INTRAVENOUS | Status: DC | PRN

## 2022-09-04 MED ORDER — SODIUM CHLORIDE 0.9 % IV SOLN
166.6000 mg | Freq: Once | INTRAVENOUS | Status: AC
  Administered 2022-09-04: 170 mg via INTRAVENOUS
  Filled 2022-09-04: qty 17

## 2022-09-04 MED ORDER — FAMOTIDINE IN NACL 20-0.9 MG/50ML-% IV SOLN
INTRAVENOUS | Status: AC
  Filled 2022-09-04: qty 50

## 2022-09-04 MED ORDER — DIPHENHYDRAMINE HCL 50 MG/ML IJ SOLN
50.0000 mg | Freq: Once | INTRAMUSCULAR | Status: AC
  Administered 2022-09-04: 50 mg via INTRAVENOUS
  Filled 2022-09-04: qty 1

## 2022-09-04 MED ORDER — PALONOSETRON HCL INJECTION 0.25 MG/5ML
0.2500 mg | Freq: Once | INTRAVENOUS | Status: AC
  Administered 2022-09-04: 0.25 mg via INTRAVENOUS
  Filled 2022-09-04: qty 5

## 2022-09-04 MED ORDER — FAMOTIDINE IN NACL 20-0.9 MG/50ML-% IV SOLN
20.0000 mg | Freq: Once | INTRAVENOUS | Status: AC
  Administered 2022-09-04: 20 mg via INTRAVENOUS
  Filled 2022-09-04: qty 50

## 2022-09-04 NOTE — Patient Instructions (Signed)
[facility]   Discharge Instructions: Thank you for choosing [facility] to provide your oncology and hematology care.   If you have a lab appointment with the Cancer Center, please go directly to the Cancer Center and check in at the registration area.   Wear comfortable clothing and clothing appropriate for easy access to any Portacath or PICC line.   We strive to give you quality time with your provider. You may need to reschedule your appointment if you arrive late (15 or more minutes).  Arriving late affects you and other patients whose appointments are after yours.  Also, if you miss three or more appointments without notifying the office, you may be dismissed from the clinic at the provider's discretion.      For prescription refill requests, have your pharmacy contact our office and allow 72 hours for refills to be completed.    Today you received the following chemotherapy and/or immunotherapy agents: Paclitaxel (Taxol) and Carboplatin       To help prevent nausea and vomiting after your treatment, we encourage you to take your nausea medication as directed.  BELOW ARE SYMPTOMS THAT SHOULD BE REPORTED IMMEDIATELY: *FEVER GREATER THAN 100.4 F (38 C) OR HIGHER *CHILLS OR SWEATING *NAUSEA AND VOMITING THAT IS NOT CONTROLLED WITH YOUR NAUSEA MEDICATION *UNUSUAL SHORTNESS OF BREATH *UNUSUAL BRUISING OR BLEEDING *URINARY PROBLEMS (pain or burning when urinating, or frequent urination) *BOWEL PROBLEMS (unusual diarrhea, constipation, pain near the anus) TENDERNESS IN MOUTH AND THROAT WITH OR WITHOUT PRESENCE OF ULCERS (sore throat, sores in mouth, or a toothache) UNUSUAL RASH, SWELLING OR PAIN  UNUSUAL VAGINAL DISCHARGE OR ITCHING   Items with * indicate a potential emergency and should be followed up as soon as possible or go to the Emergency Department if any problems should occur.  Please show the CHEMOTHERAPY ALERT CARD or  IMMUNOTHERAPY ALERT CARD at check-in to the Emergency Department and triage nurse.  Should you have questions after your visit or need to cancel or reschedule your appointment, please contact [facility]  Dept: [phone]  and follow the prompts.  Office hours are 8:00 a.m. to 4:30 p.m. Monday - Friday. Please note that voicemails left after 4:00 p.m. may not be returned until the following business day.  We are closed weekends and major holidays. You have access to a nurse at all times for urgent questions. Please call the main number to the clinic Dept: [phone] and follow the prompts.   For any non-urgent questions, you may also contact your provider using MyChart. We now offer e-Visits for anyone 18 and older to request care online for non-urgent symptoms. For details visit [email or web address].   Also download the MyChart app! Go to the app store, search "MyChart", open the app, select [facility], and log in with your MyChart username and password.   

## 2022-09-05 ENCOUNTER — Telehealth: Payer: Self-pay | Admitting: *Deleted

## 2022-09-05 ENCOUNTER — Ambulatory Visit: Admission: RE | Admit: 2022-09-05 | Payer: Medicare HMO | Source: Ambulatory Visit

## 2022-09-05 ENCOUNTER — Other Ambulatory Visit: Payer: Self-pay | Admitting: Internal Medicine

## 2022-09-05 ENCOUNTER — Other Ambulatory Visit: Payer: Self-pay

## 2022-09-05 DIAGNOSIS — N390 Urinary tract infection, site not specified: Secondary | ICD-10-CM | POA: Diagnosis not present

## 2022-09-05 DIAGNOSIS — Z510 Encounter for antineoplastic radiation therapy: Secondary | ICD-10-CM | POA: Diagnosis not present

## 2022-09-05 DIAGNOSIS — Z5111 Encounter for antineoplastic chemotherapy: Secondary | ICD-10-CM | POA: Diagnosis not present

## 2022-09-05 DIAGNOSIS — C3491 Malignant neoplasm of unspecified part of right bronchus or lung: Secondary | ICD-10-CM | POA: Diagnosis not present

## 2022-09-05 DIAGNOSIS — E860 Dehydration: Secondary | ICD-10-CM | POA: Diagnosis not present

## 2022-09-05 DIAGNOSIS — Z87891 Personal history of nicotine dependence: Secondary | ICD-10-CM | POA: Diagnosis not present

## 2022-09-05 DIAGNOSIS — C342 Malignant neoplasm of middle lobe, bronchus or lung: Secondary | ICD-10-CM | POA: Diagnosis not present

## 2022-09-05 LAB — RAD ONC ARIA SESSION SUMMARY
Course Elapsed Days: 29
Plan Fractions Treated to Date: 20
Plan Prescribed Dose Per Fraction: 2 Gy
Plan Total Fractions Prescribed: 30
Plan Total Prescribed Dose: 60 Gy
Reference Point Dosage Given to Date: 40 Gy
Reference Point Session Dosage Given: 2 Gy
Session Number: 20

## 2022-09-05 LAB — URINE CULTURE

## 2022-09-05 MED ORDER — SULFAMETHOXAZOLE-TRIMETHOPRIM 800-160 MG PO TABS: 1.0000 | ORAL_TABLET | Freq: Two times a day (BID) | ORAL | 0 refills | Status: DC

## 2022-09-05 NOTE — Progress Notes (Signed)
NN received VM from patient in regard to medication she needs to take for her UTI. NN called pt back to collect more information about pts concerns. Pt states that due to her hernia, she is unable to take pills in tablet form and they must be gel capsule or able to be crushed and put into applesauce or pudding. NN advised pt to call the pharmacy who distributed the medication to her and and see if the medication can be crushed or they have a different formula. Pt will call NN back if further assistance is needed.

## 2022-09-05 NOTE — Telephone Encounter (Signed)
LM with note below.  To call if any questions 

## 2022-09-05 NOTE — Telephone Encounter (Signed)
-----   Message from Lajuana Matte sent at 09/05/2022 10:07 AM EDT ----- She had has UTI.  I will send a prescription for Septra to her pharmacy.  Thank you ----- Message ----- From: Leory Plowman, Lab In [place] Sent: 09/04/2022   8:18 PM EDT To: Si Gaul, MD

## 2022-09-06 ENCOUNTER — Ambulatory Visit
Admission: RE | Admit: 2022-09-06 | Discharge: 2022-09-06 | Disposition: A | Discharge status: Home / Self Care | Payer: Medicare HMO | Source: Ambulatory Visit | Attending: Radiation Oncology | Admitting: Radiation Oncology

## 2022-09-06 ENCOUNTER — Other Ambulatory Visit: Payer: Self-pay

## 2022-09-06 DIAGNOSIS — C342 Malignant neoplasm of middle lobe, bronchus or lung: Secondary | ICD-10-CM | POA: Diagnosis not present

## 2022-09-06 DIAGNOSIS — C3491 Malignant neoplasm of unspecified part of right bronchus or lung: Secondary | ICD-10-CM | POA: Diagnosis not present

## 2022-09-06 DIAGNOSIS — E860 Dehydration: Secondary | ICD-10-CM | POA: Diagnosis not present

## 2022-09-06 DIAGNOSIS — Z87891 Personal history of nicotine dependence: Secondary | ICD-10-CM | POA: Diagnosis not present

## 2022-09-06 DIAGNOSIS — Z5111 Encounter for antineoplastic chemotherapy: Secondary | ICD-10-CM | POA: Diagnosis not present

## 2022-09-06 DIAGNOSIS — Z510 Encounter for antineoplastic radiation therapy: Secondary | ICD-10-CM | POA: Diagnosis not present

## 2022-09-06 DIAGNOSIS — N390 Urinary tract infection, site not specified: Secondary | ICD-10-CM | POA: Diagnosis not present

## 2022-09-06 LAB — RAD ONC ARIA SESSION SUMMARY
Course Elapsed Days: 30
Plan Fractions Treated to Date: 21
Plan Prescribed Dose Per Fraction: 2 Gy
Plan Total Fractions Prescribed: 30
Plan Total Prescribed Dose: 60 Gy
Reference Point Dosage Given to Date: 42 Gy
Reference Point Session Dosage Given: 2 Gy
Session Number: 21

## 2022-09-06 LAB — URINE CULTURE: Culture: >=100000 — AB

## 2022-09-07 ENCOUNTER — Ambulatory Visit
Admission: RE | Admit: 2022-09-07 | Discharge: 2022-09-07 | Disposition: A | Discharge status: Home / Self Care | Payer: Medicare HMO | Source: Ambulatory Visit | Attending: Radiation Oncology | Admitting: Radiation Oncology

## 2022-09-07 ENCOUNTER — Other Ambulatory Visit: Payer: Self-pay

## 2022-09-07 DIAGNOSIS — Z510 Encounter for antineoplastic radiation therapy: Secondary | ICD-10-CM | POA: Diagnosis not present

## 2022-09-07 DIAGNOSIS — E860 Dehydration: Secondary | ICD-10-CM | POA: Diagnosis not present

## 2022-09-07 DIAGNOSIS — C3491 Malignant neoplasm of unspecified part of right bronchus or lung: Secondary | ICD-10-CM | POA: Diagnosis not present

## 2022-09-07 DIAGNOSIS — Z87891 Personal history of nicotine dependence: Secondary | ICD-10-CM | POA: Diagnosis not present

## 2022-09-07 DIAGNOSIS — Z5111 Encounter for antineoplastic chemotherapy: Secondary | ICD-10-CM | POA: Diagnosis not present

## 2022-09-07 DIAGNOSIS — C342 Malignant neoplasm of middle lobe, bronchus or lung: Secondary | ICD-10-CM | POA: Diagnosis not present

## 2022-09-07 DIAGNOSIS — N390 Urinary tract infection, site not specified: Secondary | ICD-10-CM | POA: Diagnosis not present

## 2022-09-07 LAB — RAD ONC ARIA SESSION SUMMARY
Course Elapsed Days: 31
Plan Fractions Treated to Date: 22
Plan Prescribed Dose Per Fraction: 2 Gy
Plan Total Fractions Prescribed: 30
Plan Total Prescribed Dose: 60 Gy
Reference Point Dosage Given to Date: 44 Gy
Reference Point Session Dosage Given: 2 Gy
Session Number: 22

## 2022-09-08 ENCOUNTER — Ambulatory Visit: Payer: Medicare HMO

## 2022-09-08 MED FILL — Dexamethasone Sodium Phosphate Inj 100 MG/10ML: INTRAMUSCULAR | Qty: 1 | Status: AC

## 2022-09-10 ENCOUNTER — Emergency Department (HOSPITAL_COMMUNITY): Payer: Medicare HMO

## 2022-09-10 ENCOUNTER — Emergency Department (HOSPITAL_COMMUNITY)
Admission: EM | Admit: 2022-09-10 | Discharge: 2022-09-10 | Disposition: A | Discharge status: Home / Self Care | Payer: Medicare HMO | Attending: Emergency Medicine | Admitting: Emergency Medicine

## 2022-09-10 ENCOUNTER — Other Ambulatory Visit: Payer: Self-pay

## 2022-09-10 DIAGNOSIS — R079 Chest pain, unspecified: Secondary | ICD-10-CM | POA: Diagnosis not present

## 2022-09-10 DIAGNOSIS — R112 Nausea with vomiting, unspecified: Secondary | ICD-10-CM

## 2022-09-10 DIAGNOSIS — J439 Emphysema, unspecified: Secondary | ICD-10-CM | POA: Diagnosis not present

## 2022-09-10 DIAGNOSIS — R000 Tachycardia, unspecified: Secondary | ICD-10-CM | POA: Diagnosis not present

## 2022-09-10 DIAGNOSIS — R110 Nausea: Secondary | ICD-10-CM | POA: Diagnosis not present

## 2022-09-10 DIAGNOSIS — J449 Chronic obstructive pulmonary disease, unspecified: Secondary | ICD-10-CM | POA: Insufficient documentation

## 2022-09-10 DIAGNOSIS — K208 Other esophagitis without bleeding: Secondary | ICD-10-CM | POA: Insufficient documentation

## 2022-09-10 DIAGNOSIS — Z85118 Personal history of other malignant neoplasm of bronchus and lung: Secondary | ICD-10-CM | POA: Diagnosis not present

## 2022-09-10 DIAGNOSIS — C3490 Malignant neoplasm of unspecified part of unspecified bronchus or lung: Secondary | ICD-10-CM | POA: Diagnosis not present

## 2022-09-10 DIAGNOSIS — T66XXXA Radiation sickness, unspecified, initial encounter: Secondary | ICD-10-CM

## 2022-09-10 DIAGNOSIS — R109 Unspecified abdominal pain: Secondary | ICD-10-CM | POA: Diagnosis present

## 2022-09-10 DIAGNOSIS — K209 Esophagitis, unspecified without bleeding: Secondary | ICD-10-CM | POA: Diagnosis not present

## 2022-09-10 DIAGNOSIS — R1013 Epigastric pain: Secondary | ICD-10-CM | POA: Diagnosis not present

## 2022-09-10 DIAGNOSIS — R1011 Right upper quadrant pain: Secondary | ICD-10-CM | POA: Diagnosis not present

## 2022-09-10 DIAGNOSIS — K760 Fatty (change of) liver, not elsewhere classified: Secondary | ICD-10-CM | POA: Diagnosis not present

## 2022-09-10 DIAGNOSIS — R1012 Left upper quadrant pain: Secondary | ICD-10-CM | POA: Diagnosis not present

## 2022-09-10 LAB — COMPREHENSIVE METABOLIC PANEL
ALT: 63 U/L — ABNORMAL HIGH (ref 0–44)
AST: 37 U/L (ref 15–41)
Albumin: 3.4 g/dL — ABNORMAL LOW (ref 3.5–5.0)
Alkaline Phosphatase: 50 U/L (ref 38–126)
Anion gap: 10 (ref 5–15)
BUN: 18 mg/dL (ref 8–23)
CO2: 21 mmol/L — ABNORMAL LOW (ref 22–32)
Calcium: 8.4 mg/dL — ABNORMAL LOW (ref 8.9–10.3)
Chloride: 99 mmol/L (ref 98–111)
Creatinine, Ser: 0.85 mg/dL (ref 0.44–1.00)
GFR, Estimated: >60 mL/min (ref >60)
Glucose, Bld: 114 mg/dL — ABNORMAL HIGH (ref 70–99)
Potassium: 3.5 mmol/L (ref 3.5–5.1)
Sodium: 130 mmol/L — ABNORMAL LOW (ref 135–145)
Total Bilirubin: 1.3 mg/dL — ABNORMAL HIGH (ref 0.3–1.2)
Total Protein: 6.8 g/dL (ref 6.5–8.1)

## 2022-09-10 LAB — TROPONIN I (HIGH SENSITIVITY)
Troponin I (High Sensitivity): 4 ng/L (ref <18)
Troponin I (High Sensitivity): 4 ng/L (ref <18)

## 2022-09-10 LAB — URINALYSIS, W/ REFLEX TO CULTURE (INFECTION SUSPECTED)
Bacteria, UA: NONE SEEN
Bilirubin Urine: NEGATIVE
Glucose, UA: NEGATIVE mg/dL
Hgb urine dipstick: NEGATIVE
Ketones, ur: 5 mg/dL — AB
Leukocytes,Ua: NEGATIVE
Nitrite: NEGATIVE
Protein, ur: NEGATIVE mg/dL
Specific Gravity, Urine: 1.019 (ref 1.005–1.030)
pH: 5 (ref 5.0–8.0)

## 2022-09-10 LAB — CBC WITH DIFFERENTIAL/PLATELET
Abs Immature Granulocytes: 0.01 10*3/uL (ref 0.00–0.07)
Basophils Absolute: 0 10*3/uL (ref 0.0–0.1)
Basophils Relative: 1 %
Eosinophils Absolute: 0 10*3/uL (ref 0.0–0.5)
Eosinophils Relative: 1 %
HCT: 38.1 % (ref 36.0–46.0)
Hemoglobin: 12.7 g/dL (ref 12.0–15.0)
Immature Granulocytes: 1 %
Lymphocytes Relative: 35 %
Lymphs Abs: 0.5 10*3/uL — ABNORMAL LOW (ref 0.7–4.0)
MCH: 27.4 pg (ref 26.0–34.0)
MCHC: 33.3 g/dL (ref 30.0–36.0)
MCV: 82.3 fL (ref 80.0–100.0)
Monocytes Absolute: 0.1 10*3/uL (ref 0.1–1.0)
Monocytes Relative: 8 %
Neutro Abs: 0.8 10*3/uL — ABNORMAL LOW (ref 1.7–7.7)
Neutrophils Relative %: 54 %
Platelets: 128 10*3/uL — ABNORMAL LOW (ref 150–400)
RBC: 4.63 MIL/uL (ref 3.87–5.11)
RDW: 18 % — ABNORMAL HIGH (ref 11.5–15.5)
WBC: 1.5 10*3/uL — ABNORMAL LOW (ref 4.0–10.5)
nRBC: 0 % (ref 0.0–0.2)

## 2022-09-10 LAB — LIPASE, BLOOD: Lipase: 29 U/L (ref 11–51)

## 2022-09-10 MED ORDER — MAALOX MAX 400-400-40 MG/5ML PO SUSP: 15.0000 mL | Freq: Four times a day (QID) | ORAL | 0 refills | Status: DC | PRN

## 2022-09-10 MED ORDER — PANTOPRAZOLE SODIUM 40 MG PO TBEC
40.0000 mg | DELAYED_RELEASE_TABLET | Freq: Once | ORAL | Status: AC
  Administered 2022-09-10: 40 mg via ORAL
  Filled 2022-09-10: qty 1

## 2022-09-10 MED ORDER — SODIUM CHLORIDE (PF) 0.9 % IJ SOLN
INTRAMUSCULAR | Status: AC
  Filled 2022-09-10: qty 50

## 2022-09-10 MED ORDER — IOHEXOL 300 MG/ML  SOLN
100.0000 mL | Freq: Once | INTRAMUSCULAR | Status: AC | PRN
  Administered 2022-09-10: 100 mL via INTRAVENOUS

## 2022-09-10 MED ORDER — FAMOTIDINE IN NACL 20-0.9 MG/50ML-% IV SOLN
20.0000 mg | Freq: Once | INTRAVENOUS | Status: AC
  Administered 2022-09-10: 20 mg via INTRAVENOUS
  Filled 2022-09-10: qty 50

## 2022-09-10 MED ORDER — LIDOCAINE VISCOUS HCL 2 % MT SOLN: 15.0000 mL | Freq: Four times a day (QID) | OROMUCOSAL | 0 refills | Status: DC | PRN

## 2022-09-10 MED ORDER — ALUM & MAG HYDROXIDE-SIMETH 200-200-20 MG/5ML PO SUSP
30.0000 mL | Freq: Once | ORAL | Status: AC
  Administered 2022-09-10: 30 mL via ORAL
  Filled 2022-09-10: qty 30

## 2022-09-10 MED ORDER — ONDANSETRON HCL 4 MG/2ML IJ SOLN
4.0000 mg | Freq: Once | INTRAMUSCULAR | Status: AC
  Administered 2022-09-10: 4 mg via INTRAVENOUS
  Filled 2022-09-10: qty 2

## 2022-09-10 MED ORDER — LACTATED RINGERS IV BOLUS
1000.0000 mL | Freq: Once | INTRAVENOUS | Status: AC
  Administered 2022-09-10: 1000 mL via INTRAVENOUS

## 2022-09-10 MED ORDER — LIDOCAINE VISCOUS HCL 2 % MT SOLN
15.0000 mL | Freq: Once | OROMUCOSAL | Status: AC
  Administered 2022-09-10: 15 mL via ORAL
  Filled 2022-09-10: qty 15

## 2022-09-10 NOTE — ED Triage Notes (Signed)
Pt BIBA from [facility] with c/o abdominal pain x2 days, esophogeal pain when eating or drinking, gotten worse x2days. R upper quad pain, hurts worse on palpation. Did not receive radiation on Friday. Nauseous last night and had an episode emesis this morning. BM x2days ago. Dx with UTI last week but did not finish last two doses.   BP sys 132 HR 112

## 2022-09-10 NOTE — ED Provider Notes (Signed)
  Physical Exam  BP 116/71   Pulse 95   Temp (!) 97.3 F (36.3 C) (Oral)   Resp 16   SpO2 97%   Physical Exam Constitutional:      Appearance: She is well-developed.  Cardiovascular:     Rate and Rhythm: Normal rate and regular rhythm.  Pulmonary:     Effort: Pulmonary effort is normal.  Abdominal:     Tenderness: There is abdominal tenderness in the right upper quadrant and epigastric area.  Skin:    Capillary Refill: Capillary refill takes less than 2 seconds.  Neurological:     Mental Status: She is alert.     Procedures  Procedures  ED Course / MDM   Clinical Course as of 09/10/22 1850  Sun Sep 10, 2022  1425 Labs with mildly worsening neutropenia, mild hyponatremia, likely from dehydration. Initial troponin negative but symptoms ongoing for several days so single troponin is sufficient. UA is pending. [VK]  1507 Received sign out pending urinalysis. Has history of radiation esophagitis. Also needs PO trial.  [WS]  1511 Patient signed out to Dr. Suezanne Jacquet pending urine, pain control and PO trial. [VK]  1627 Reassessed, she is feeling much better.  She does still have some abdominal tenderness.  Given patient's past medical history, neutropenia, currently receiving chemotherapy, feel it is important to still exclude other causes of abdominal pain such as cholecystitis.  Will obtain CT scan, plan to discharge if negative. [WS]  1849 CT scan is negative.  Patient continues to feel well, request additional dose of nausea medication before we take her IV out.  This has been ordered.  I sent prescription for viscous lidocaine and Maalox to the patient's pharmacy.  She will follow-up with her oncologist and her radiation oncologist tomorrow for her scheduled treatment.  Advised that she discuss her current symptoms so they can determine whether any adjustments need to be made to her symptom management. Will discharge patient to home. All questions answered. Patient comfortable with  plan of discharge. Return precautions discussed with patient and specified on the after visit summary.  [WS]    Clinical Course User Index [VK] Rexford Maus, DO [WS] Lonell Grandchild, MD   Medical Decision Making Amount and/or Complexity of Data Reviewed Labs: ordered. Radiology: ordered.  Risk OTC drugs. Prescription drug management.          Lonell Grandchild, MD 09/10/22 [phone]

## 2022-09-10 NOTE — ED Provider Notes (Signed)
[place] [facility] Provider Note   CSN: [id number] Arrival date & time: 09/10/22  1227     History  Chief Complaint  Patient presents with   Abdominal Pain    Shewanda Sharpe is a 72 y.o. female.  Patient is a 72 year old female with a past medical history of lung cancer on chemo and radiation, COPD, hiatal hernia with GERD presenting to the emergency department with nausea and vomiting.  She states that she last had chemo on Monday and radiation on Thursday and has been having worsening epigastric abdominal pain since then.  She states that she started to have nausea and vomiting yesterday.  She states that she has had worsening epigastric pain since.  She denies any fevers, diarrhea or constipation.  She states that the pain radiates into her chest and denies any shortness of breath.  She states that it feels like her GERD type of pain but she has been unable to keep down her meds from the vomiting.  The history is provided by the patient and a relative.  Abdominal Pain      Home Medications Prior to Admission medications   Medication Sig Start Date End Date Taking? Authorizing Provider  benzonatate (TESSALON) 200 MG capsule Take 1 capsule (200 mg total) by mouth 3 (three) times daily as needed for cough. 06/06/22   [name], PA-C  cetirizine (ZYRTEC) 10 MG tablet Take 10 mg by mouth daily. 06/22/20   [provider]  Coenzyme Q10 (CO Q-10 PO) Take 1 tablet by mouth daily.    [provider]  cyclobenzaprine (FLEXERIL) 5 MG tablet Take 1 tablet (5 mg total) by mouth 3 (three) times daily as needed for muscle spasms. 04/13/22   [name], PA-C  dexamethasone (DECADRON) 4 MG tablet Take 2 tablets daily for 2 days, start the day after chemotherapy. Take with food. 08/07/22   Si Gaul, MD  Fluticasone-Umeclidin-Vilant (TRELEGY ELLIPTA) 100-62.5-25 MCG/ACT AEPB Inhale 1 puff into the lungs daily. 05/06/21   Janie Morning,  NP  Homeopathic Products (LEG CRAMPS) SUBL Place 2 tablets under the tongue daily as needed (leg cramps).    [provider]  HYDROcodone-acetaminophen (HYCET) 7.5-325 mg/15 ml solution Take 5-10 mLs by mouth every 4 (four) hours as needed for moderate pain. 08/23/22   Ronny Bacon, PA-C  lidocaine-prilocaine (EMLA) cream Apply to affected area once 08/07/22   Si Gaul, MD  montelukast (SINGULAIR) 10 MG tablet TAKE 1 TABLET AT BEDTIME 02/02/22   [name], PA-C  Multiple Vitamin (MULTIVITAMIN ADULT PO) Take 2 tablets by mouth daily.    [provider]  mupirocin ointment (BACTROBAN) 2 % Apply 1 application topically 2 (two) times daily. Patient taking differently: Apply 1 application  topically 2 (two) times daily as needed (wound care). 12/10/20   [name], PA-C  omeprazole (PRILOSEC) 20 MG capsule TAKE 1 CAPSULE TWICE DAILY BEFORE MEALS 02/02/22   [name], PA-C  ondansetron (ZOFRAN) 8 MG tablet Take 1 tablet (8 mg total) by mouth every 8 (eight) hours as needed for nausea or vomiting. Start on the third day after chemotherapy. 08/07/22   Si Gaul, MD  polyethylene glycol (MIRALAX / GLYCOLAX) 17 g packet Take 17 g by mouth daily as needed for moderate constipation.    [provider]  prochlorperazine (COMPAZINE) 10 MG tablet Take 1 tablet (10 mg total) by mouth every 6 (six) hours as needed for nausea or vomiting. 08/07/22   Arbutus Ped,  Mohamed, MD  rosuvastatin (CRESTOR) 5 MG tablet TAKE 1 TABLET EVERY DAY Patient taking differently: Take 5 mg by mouth at bedtime. 02/02/22   [name], PA-C  sucralfate (CARAFATE) 1 g tablet Take 1 tablet (1 g total) by mouth 4 (four) times daily. Dissolve each tablet in 15 cc water before use. 08/18/22   Dorothy Puffer, MD  sulfamethoxazole-trimethoprim (BACTRIM DS) 800-160 MG tablet Take 1 tablet by mouth 2 (two) times daily. 09/05/22   Si Gaul, MD  VITAMIN D PO Take 2 tablets by mouth daily.     [provider]      Allergies    Codeine and Surgical lubricant    Review of Systems   Review of Systems  Gastrointestinal:  Positive for abdominal pain.    Physical Exam Updated Vital Signs BP 122/75 (BP Location: Right Arm)   Pulse (!) 112   Temp (!) 97.4 F (36.3 C) (Oral)   Resp (!) 22   SpO2 98%  Physical Exam Vitals and nursing note reviewed.  Constitutional:      General: She is not in acute distress.    Appearance: She is well-developed.  HENT:     Head: Normocephalic and atraumatic.     Mouth/Throat:     Mouth: Mucous membranes are moist.     Pharynx: Oropharynx is clear.  Eyes:     Extraocular Movements: Extraocular movements intact.  Cardiovascular:     Rate and Rhythm: Normal rate and regular rhythm.     Heart sounds: Normal heart sounds.  Pulmonary:     Effort: Pulmonary effort is normal.     Breath sounds: Normal breath sounds.  Abdominal:     General: Abdomen is flat.     Palpations: Abdomen is soft.     Tenderness: There is abdominal tenderness in the epigastric area. There is no guarding or rebound.  Skin:    General: Skin is warm and dry.  Neurological:     General: No focal deficit present.     Mental Status: She is alert and oriented to person, place, and time.  Psychiatric:        Mood and Affect: Mood normal.        Behavior: Behavior normal.     ED Results / Procedures / Treatments   Labs (all labs ordered are listed, but only abnormal results are displayed) Labs Reviewed  COMPREHENSIVE METABOLIC PANEL - Abnormal; Notable for the following components:      Result Value   Sodium 130 (*)    CO2 21 (*)    Glucose, Bld 114 (*)    Calcium 8.4 (*)    Albumin 3.4 (*)    ALT 63 (*)    Total Bilirubin 1.3 (*)    All other components within normal limits  CBC WITH DIFFERENTIAL/PLATELET - Abnormal; Notable for the following components:   WBC 1.5 (*)    RDW 18.0 (*)    Platelets 128 (*)    Neutro Abs 0.8 (*)    Lymphs Abs 0.5  (*)    All other components within normal limits  LIPASE, BLOOD  URINALYSIS, W/ REFLEX TO CULTURE (INFECTION SUSPECTED)  TROPONIN I (HIGH SENSITIVITY)  TROPONIN I (HIGH SENSITIVITY)    EKG None  Radiology DG Chest Port 1 View  Result Date: 09/10/2022 CLINICAL DATA:  Left upper quadrant pain and chest pain. History of lung cancer with radiation therapy. EXAM: PORTABLE CHEST 1 VIEW COMPARISON:  07/18/22 FINDINGS: Heart size is normal. Postoperative changes  from right middle lobectomy. No signs of pleural effusion, interstitial edema, or airspace disease. 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 Visualized osseous structures are unremarkable. IMPRESSION: 1. No acute cardiopulmonary disease. 2. Status post right middle lobectomy. 3. COPD/emphysema. Electronically Signed   By: Signa Kell M.D.   On: 09/10/2022 13:54    Procedures Procedures    Medications Ordered in ED Medications  alum & mag hydroxide-simeth (MAALOX/MYLANTA) 200-200-20 MG/5ML suspension 30 mL (has no administration in time range)    And  lidocaine (XYLOCAINE) 2 % viscous mouth solution 15 mL (has no administration in time range)  pantoprazole (PROTONIX) EC tablet 40 mg (has no administration in time range)  lactated ringers bolus 1,000 mL (1,000 mLs Intravenous New Bag/Given 09/10/22 1327)  famotidine (PEPCID) IVPB 20 mg premix (20 mg Intravenous New Bag/Given 09/10/22 1324)  ondansetron (ZOFRAN) injection 4 mg (4 mg Intravenous Given 09/10/22 1320)    ED Course/ Medical Decision Making/ A&P Clinical Course as of 09/10/22 1512  Sun Sep 10, 2022  1425 Labs with mildly worsening neutropenia, mild hyponatremia, likely from dehydration. Initial troponin negative but symptoms ongoing for several days so single troponin is sufficient. UA is pending. [VK]  1507 Received sign out pending urinalysis. Has history of radiation esophagitis. Also needs PO trial.  [WS]  1511 Patient signed out to Dr.  Suezanne Jacquet pending urine, pain control and PO trial. [VK]    Clinical Course User Index [VK] Rexford Maus, DO [WS] Lonell Grandchild, MD                             Medical Decision Making This patient presents to the ED with chief complaint(s) of epigastric pain, N/V with pertinent past medical history of lung cancer on chemo and radiation, GERD/hiatal hernia which further complicates the presenting complaint. The complaint involves an extensive differential diagnosis and also carries with it a high risk of complications and morbidity.    The differential diagnosis includes gastritis, GERD, radiation esophagitis, dehydration, electrolyte abnormality, ACS, pneumonia, pneumothorax, pulmonary edema, pleural effusion, pancreatitis, hepatitis, cholelithiasis, cholecystitis  Additional history obtained: Additional history obtained from family Records reviewed outpatient oncology records  ED Course and Reassessment: On patient's arrival to the emergency department she is tachycardic but otherwise hemodynamically stable in no acute distress.  The patient will have EKG and labs performed to evaluate for cause of her symptoms.  She will be started on fluids, Zofran and Pepcid for symptomatic management and will be closely reassessed.  Independent labs interpretation:  The following labs were independently interpreted: neutropenia, mild hypnatremia  Independent visualization of imaging: - I independently visualized the following imaging with scope of interpretation limited to determining acute life threatening conditions related to emergency care: CXR, which revealed No acute disease  Consultation: - Consulted or discussed management/test interpretation w/ external professional: N/A     Amount and/or Complexity of Data Reviewed Labs: ordered. Radiology: ordered.  Risk OTC drugs. Prescription drug management.          Final Clinical Impression(s) / ED Diagnoses Final  diagnoses:  Radiation esophagitis  Nausea and vomiting, unspecified vomiting type    Rx / DC Orders ED Discharge Orders     None         Rexford Maus, DO 09/10/22 1512

## 2022-09-10 NOTE — Discharge Instructions (Addendum)
We evaluated you for your abdominal pain, nausea and trouble swallowing.  Your evaluation including laboratory tests and a CT scan were reassuring and did not show any sign of a dangerous condition.

## 2022-09-11 ENCOUNTER — Ambulatory Visit: Admission: RE | Admit: 2022-09-11 | Payer: Medicare HMO | Source: Ambulatory Visit

## 2022-09-11 ENCOUNTER — Inpatient Hospital Stay: Payer: Medicare HMO

## 2022-09-11 ENCOUNTER — Other Ambulatory Visit: Payer: Self-pay

## 2022-09-11 ENCOUNTER — Inpatient Hospital Stay: Payer: Medicare HMO | Admitting: Internal Medicine

## 2022-09-11 VITALS — BP 125/56 | HR 111 | Temp 97.6°F | Resp 17 | Ht 67.0 in | Wt 151.4 lb

## 2022-09-11 VITALS — BP 118/66 | HR 97

## 2022-09-11 DIAGNOSIS — Z5111 Encounter for antineoplastic chemotherapy: Secondary | ICD-10-CM | POA: Diagnosis not present

## 2022-09-11 DIAGNOSIS — C3491 Malignant neoplasm of unspecified part of right bronchus or lung: Secondary | ICD-10-CM

## 2022-09-11 DIAGNOSIS — Z510 Encounter for antineoplastic radiation therapy: Secondary | ICD-10-CM | POA: Diagnosis not present

## 2022-09-11 DIAGNOSIS — C342 Malignant neoplasm of middle lobe, bronchus or lung: Secondary | ICD-10-CM | POA: Diagnosis not present

## 2022-09-11 DIAGNOSIS — N390 Urinary tract infection, site not specified: Secondary | ICD-10-CM | POA: Diagnosis not present

## 2022-09-11 DIAGNOSIS — E860 Dehydration: Secondary | ICD-10-CM | POA: Diagnosis not present

## 2022-09-11 DIAGNOSIS — Z87891 Personal history of nicotine dependence: Secondary | ICD-10-CM | POA: Diagnosis not present

## 2022-09-11 LAB — CBC WITH DIFFERENTIAL (CANCER CENTER ONLY)
Abs Immature Granulocytes: 0.01 10*3/uL (ref 0.00–0.07)
Basophils Absolute: 0 10*3/uL (ref 0.0–0.1)
Basophils Relative: 1 %
Eosinophils Absolute: 0 10*3/uL (ref 0.0–0.5)
Eosinophils Relative: 1 %
HCT: 38.2 % (ref 36.0–46.0)
Hemoglobin: 12.6 g/dL (ref 12.0–15.0)
Immature Granulocytes: 1 %
Lymphocytes Relative: 29 %
Lymphs Abs: 0.4 10*3/uL — ABNORMAL LOW (ref 0.7–4.0)
MCH: 26.9 pg (ref 26.0–34.0)
MCHC: 33 g/dL (ref 30.0–36.0)
MCV: 81.4 fL (ref 80.0–100.0)
Monocytes Absolute: 0.1 10*3/uL (ref 0.1–1.0)
Monocytes Relative: 8 %
Neutro Abs: 0.9 10*3/uL — ABNORMAL LOW (ref 1.7–7.7)
Neutrophils Relative %: 60 %
Platelet Count: 133 10*3/uL — ABNORMAL LOW (ref 150–400)
RBC: 4.69 MIL/uL (ref 3.87–5.11)
RDW: 17.9 % — ABNORMAL HIGH (ref 11.5–15.5)
Smear Review: NORMAL
WBC Count: 1.4 10*3/uL — ABNORMAL LOW (ref 4.0–10.5)
nRBC: 0 % (ref 0.0–0.2)

## 2022-09-11 LAB — CMP (CANCER CENTER ONLY)
ALT: 52 U/L — ABNORMAL HIGH (ref 0–44)
AST: 28 U/L (ref 15–41)
Albumin: 3.8 g/dL (ref 3.5–5.0)
Alkaline Phosphatase: 53 U/L (ref 38–126)
Anion gap: 10 (ref 5–15)
BUN: 13 mg/dL (ref 8–23)
CO2: 25 mmol/L (ref 22–32)
Calcium: 8.9 mg/dL (ref 8.9–10.3)
Chloride: 99 mmol/L (ref 98–111)
Creatinine: 0.92 mg/dL (ref 0.44–1.00)
GFR, Estimated: >60 mL/min (ref >60)
Glucose, Bld: 111 mg/dL — ABNORMAL HIGH (ref 70–99)
Potassium: 3.9 mmol/L (ref 3.5–5.1)
Sodium: 134 mmol/L — ABNORMAL LOW (ref 135–145)
Total Bilirubin: 0.8 mg/dL (ref 0.3–1.2)
Total Protein: 6.4 g/dL — ABNORMAL LOW (ref 6.5–8.1)

## 2022-09-11 LAB — RAD ONC ARIA SESSION SUMMARY
Course Elapsed Days: 35
Plan Fractions Treated to Date: 23
Plan Prescribed Dose Per Fraction: 2 Gy
Plan Total Fractions Prescribed: 30
Plan Total Prescribed Dose: 60 Gy
Reference Point Dosage Given to Date: 46 Gy
Reference Point Session Dosage Given: 2 Gy
Session Number: 23

## 2022-09-11 MED ORDER — SODIUM CHLORIDE 0.9 % IV SOLN: Freq: Once | INTRAVENOUS | Status: AC

## 2022-09-11 MED ORDER — SODIUM CHLORIDE 0.9 % IV SOLN: Freq: Once | INTRAVENOUS | Status: DC

## 2022-09-11 MED ORDER — ONDANSETRON HCL 4 MG/2ML IJ SOLN
8.0000 mg | Freq: Once | INTRAMUSCULAR | Status: AC
  Administered 2022-09-11: 8 mg via INTRAVENOUS
  Filled 2022-09-11: qty 4

## 2022-09-11 NOTE — Patient Instructions (Signed)
Dehydration, Adult Dehydration is a condition in which there is not enough water or other fluids in the body. This happens when a person loses more fluids than they take in. Important organs, such as the kidneys, brain, and heart, cannot function without a proper amount of fluids. Any loss of fluids from the body can lead to dehydration. Dehydration can be mild, moderate, or severe. It should be treated right away to prevent it from becoming severe. What are the causes? Dehydration may be caused by: Health conditions, such as diarrhea, vomiting, fever, infection, or sweating or urinating a lot. Not drinking enough fluids. Certain medicines, such as medicines that remove excess fluid from the body (diuretics). Lack of safe drinking water. Not being able to get enough water and food. What increases the risk? The following factors may make you more likely to develop this condition: Having a long-term (chronic) illness that has not been treated properly, such as diabetes, heart disease, or kidney disease. Being 74 years of age or older. Having a disability. Living in a place that is high in altitude, where thinner, drier air causes more fluid loss. Doing exercises that put stress on your body for a long time (endurance sports). Being active in a hot climate. What are the signs or symptoms? Symptoms of dehydration depend on how severe it is. Mild or moderate dehydration Thirst. Dry lips or dry mouth. Dizziness or light-headedness. Muscle cramps. Dark urine. Urine may be the color of tea. Less urine or tears produced than usual. Headache. Severe dehydration Changes in skin. Your skin may be cold and clammy, blotchy, or pale. Your skin also may not return to normal after being lightly pinched and released. Little or no tears, urine, or sweat. Rapid breathing and low blood pressure. Your pulse may be weak or may be faster than 100 beats per minute when you are sitting still. Other changes,  such as: Feeling very thirsty. Sunken eyes. Cold hands and feet. Confusion. Being very tired (lethargic) or having trouble waking from sleep. Short-term weight loss. Loss of consciousness. How is this diagnosed? This condition is diagnosed based on your symptoms and a physical exam. You may have blood and urine tests to help confirm the diagnosis. How is this treated? Treatment for this condition depends on how severe it is. Treatment should be started right away. Do not wait until dehydration becomes severe. Severe dehydration is an emergency and needs to be treated in a hospital. Mild or moderate dehydration can be treated at home. You may be asked to: Drink more fluids. Drink an oral rehydration solution (ORS). This drink restores fluids, salts, and minerals in the blood (electrolytes). Stop any activities that caused dehydration, such as exercise. Cool off with cool compresses, cool mist, or cool fluids, if heat or too much sweat caused your condition. Take medicine to treat fever, if fever caused your condition. Take medicine to treat nausea and diarrhea, if vomiting or diarrhea caused your condition. Severe dehydration can be treated: With IV fluids. By correcting abnormal levels of electrolytes in your body. By treating the underlying cause of dehydration. Follow these instructions at home: Oral rehydration solution If told by your health care provider, drink an ORS: Make an ORS by following instructions on the package. Start by drinking small amounts, about  cup (120 mL) every 5-10 minutes. Slowly increase how much you drink until you have taken the amount recommended by your health care provider.  Eating and drinking  Drink enough clear fluid to keep  your urine pale yellow. If you were told to drink an ORS, finish the ORS first and then start slowly drinking other clear fluids. Drink fluids such as: Water. Do not drink only water. Doing that can lead to hyponatremia, which  is having too little salt (sodium) in the body. Water from ice chips you suck on. Diluted fruit juice. This is fruit juice that you have added water to. Low-calorie sports drinks. Eat foods that contain a healthy balance of electrolytes, such as bananas, oranges, potatoes, tomatoes, and spinach. Do not drink alcohol. Avoid the following: Drinks that contain a lot of sugar. These include high-calorie sports drinks, fruit juice that is not diluted, and soda. Caffeine. Foods that are greasy or contain a lot of fat or sugar. General instructions Take over-the-counter and prescription medicines only as told by your health care provider. Do not take sodium tablets. Doing that can lead to having too much sodium in the body (hypernatremia). Return to your normal activities as told by your health care provider. Ask your health care provider what activities are safe for you. Keep all follow-up visits. Your health care provider may need to check your progress and suggest new ways to treat your condition. Contact a health care provider if: You have muscle cramps, pain, or discomfort, such as: Pain in your abdomen and the pain gets worse or stays in one area. Stiff neck. You have a rash. You are more irritable than usual. You are sleepier or have a harder time waking. You feel weak or dizzy. You feel very thirsty. Get help right away if: You have symptoms of severe dehydration. You vomit every time you eat or drink. Your vomiting gets worse, does not go away, or includes blood or green matter (bile). You are getting treatment but symptoms are getting worse. You have a fever. You have a severe headache. You have: Diarrhea that gets worse or does not go away. Blood in your stool. This may cause stool to look black and tarry. Not urinating, or urinating only a small amount of very dark urine, within 6-8 hours. You have trouble breathing. These symptoms may be an emergency. Get help right  away. Do not wait to see if the symptoms will go away. Do not drive yourself to the hospital. Call 911. This information is not intended to replace advice given to you by your health care provider. Make sure you discuss any questions you have with your health care provider. Document Revised: 09/05/2021 Document Reviewed: 09/05/2021 Elsevier Patient Education  2024 Elsevier Inc.   Ondansetron Injection What is this medication? ONDANSETRON (on DAN se tron) prevents nausea and vomiting from chemotherapy, radiation, or surgery. It works by blocking substances in the body that may cause nausea or vomiting. It belongs to a class of medications called antiemetics. This medicine may be used for other purposes; ask your health care provider or pharmacist if you have questions. COMMON BRAND NAME(S): Zofran, Zofran in Dextrose, Zofran Solution What should I tell my care team before I take this medication? They need to know if you have any of these conditions: Heart disease History of irregular heartbeat Liver disease Low levels of magnesium or potassium in the blood An unusual or allergic reaction to ondansetron, granisetron, other medications, foods, dyes, or preservatives Pregnant or trying to get pregnant Breast-feeding How should I use this medication? This medication is injected into a vein. It is given by your care team in a hospital or clinic setting. Talk to your care  team about the use of this medication in children. Special care may be needed. Overdosage: If you think you have taken too much of this medicine contact a poison control center or emergency room at once. NOTE: This medicine is only for you. Do not share this medicine with others. What if I miss a dose? This does not apply. What may interact with this medication? Do not take this medication with any of the following: Apomorphine Certain medications for fungal infections, such as fluconazole, itraconazole, ketoconazole,  posaconazole, voriconazole Cisapride Dronedarone Pimozide Thioridazine This medication may also interact with the following: Carbamazepine Certain medications for depression, anxiety, or mental health conditions Fentanyl Linezolid MAOIs, such as Carbex, Eldepryl, Marplan, Nardil, and Parnate Methylene blue (injected into a vein) Other medications that cause heart rhythm changes, such as dofetilide, ziprasidone Phenytoin Rifampicin Tramadol This list may not describe all possible interactions. Give your health care provider a list of all the medicines, herbs, non-prescription drugs, or dietary supplements you use. Also tell them if you smoke, drink alcohol, or use illegal drugs. Some items may interact with your medicine. What should I watch for while using this medication? Your condition will be monitored carefully while you are receiving this medication. What side effects may I notice from receiving this medication? Side effects that you should report to your care team as soon as possible: Allergic reactions--skin rash, itching, hives, swelling of the face, lips, tongue, or throat Bowel blockage--stomach cramping, unable to have a bowel movement or pass gas, loss of appetite, vomiting Chest pain (angina)--pain, pressure, or tightness in the chest, neck, back, or arms Heart rhythm changes--fast or irregular heartbeat, dizziness, feeling faint or lightheaded, chest pain, trouble breathing Irritability, confusion, fast or irregular heartbeat, muscle stiffness, twitching muscles, sweating, high fever, seizure, chills, vomiting, diarrhea, which may be signs of serotonin syndrome Side effects that usually do not require medical attention (report to your care team if they continue or are bothersome): Constipation Diarrhea General discomfort and fatigue Headache This list may not describe all possible side effects. Call your doctor for medical advice about side effects. You may report side  effects to FDA at 1-800-FDA-1088. Where should I keep my medication? This medication is given in a hospital or clinic and will not be stored at home. NOTE: This sheet is a summary. It may not cover all possible information. If you have questions about this medicine, talk to your doctor, pharmacist, or health care provider.  2024 Elsevier/Gold Standard (2021-07-07 00:00:00)

## 2022-09-11 NOTE — Progress Notes (Signed)
[facility] [phone]   [phone]  OFFICE PROGRESS NOTE  [name], PA-C [address] [place] [address]  DIAGNOSIS:  stage IIIA (T2a, N2, M0) non-small cell lung cancer, adenocarcinoma that was initially diagnosed as a stage Ib (T2a, N0, M0) non-small cell lung cancer, adenocarcinoma status post right middle lobectomy on March 29, 2020 and the patient had evidence for disease recurrence in the mediastinal lymph nodes in May 2024.   MOLECULAR STUDY by [facility]:  KRAS G12C mutation [id number] ATRX [id number] R56L  PDL1 Expression 100%   PRIOR THERAPY: None  CURRENT THERAPY: Concurrent chemoradiation with weekly carboplatin for AUC of 2 and paclitaxel 45 Mg/M2.  First dose August 07, 2022.  Status post 5 cycles.  INTERVAL HISTORY: [name] 72 y.o. female returns to the clinic today for follow-up visit accompanied by her daughter.  The patient is complaining of increasing fatigue and weakness as well as lack of appetite and significant odynophagia.  She was seen at the emergency department yesterday and CBC showed leukocytopenia.  She received IV hydration.  She is here today for evaluation before starting cycle #6 of her treatment.  She continues to have generalized weakness and fatigue.  She has no current chest pain but has mild cough with no shortness of breath or hemoptysis.  She has no nausea, vomiting, diarrhea or constipation.  She has no headache or visual changes.  MEDICAL HISTORY: Past Medical History:  Diagnosis Date   Abnormal chest x-ray 01/19/2020   Acute laryngopharyngitis 03/18/2020   Aortic atherosclerosis (HCC) 01/28/2020   Arthritis    Cardiac murmur 03/01/2020   COPD (chronic obstructive pulmonary disease) (HCC)    Coronary artery calcification seen on CT scan 03/01/2020   Cough 01/12/2020   COVID-19 03/10/2020   Dyspnea    on exertion,after getting covid   Family history of adverse  reaction to anesthesia    brothr had n/v   GERD (gastroesophageal reflux disease)    Hiatal hernia    Hyperlipemia    Lung cancer, middle lobe (HCC)    middle lobe removed   Pneumonia    PONV (postoperative nausea and vomiting)    Pre-operative cardiovascular examination 03/01/2020   Right lower lobe lung mass 01/28/2020   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  MEDICATIONS:  Current Outpatient Medications  Medication Sig Dispense Refill   alum & mag hydroxide-simeth (MAALOX MAX) 400-400-40 MG/5ML suspension Take 15 mLs by mouth every 6 (six) hours as needed for indigestion. 355 mL 0   benzonatate (TESSALON) 200 MG capsule Take 1 capsule (200 mg total) by mouth 3 (three) times daily as needed for cough. 30 capsule 0   cetirizine (ZYRTEC) 10 MG tablet Take 10 mg by mouth daily.     Coenzyme Q10 (CO Q-10 PO) Take 1 tablet by mouth daily.     cyclobenzaprine (FLEXERIL) 5 MG tablet Take 1 tablet (5 mg total) by mouth 3 (three) times daily as needed for muscle spasms. 30 tablet 1   dexamethasone (DECADRON) 4 MG tablet Take 2 tablets daily for 2 days, start the day after chemotherapy. Take with food. 30 tablet 1   Fluticasone-Umeclidin-Vilant (TRELEGY ELLIPTA) 100-62.5-25 MCG/ACT AEPB Inhale 1 puff into the lungs daily. 2 each 0   Homeopathic Products (LEG CRAMPS) SUBL Place 2 tablets under the tongue  daily as needed (leg cramps).     HYDROcodone-acetaminophen (HYCET) 7.5-325 mg/15 ml solution Take 5-10 mLs by mouth every 4 (four) hours as needed for moderate pain. 120 mL 0   lidocaine (XYLOCAINE) 2 % solution Use as directed 15 mLs in the mouth or throat every 6 (six) hours as needed for mouth pain. 100 mL 0   lidocaine-prilocaine (EMLA) cream Apply to affected area once 30 g 3   montelukast (SINGULAIR) 10 MG tablet TAKE 1 TABLET AT BEDTIME 90 tablet 3   Multiple Vitamin  (MULTIVITAMIN ADULT PO) Take 2 tablets by mouth daily.     mupirocin ointment (BACTROBAN) 2 % Apply 1 application topically 2 (two) times daily. (Patient taking differently: Apply 1 application  topically 2 (two) times daily as needed (wound care).) 22 g 2   omeprazole (PRILOSEC) 20 MG capsule TAKE 1 CAPSULE TWICE DAILY BEFORE MEALS 180 capsule 3   ondansetron (ZOFRAN) 8 MG tablet Take 1 tablet (8 mg total) by mouth every 8 (eight) hours as needed for nausea or vomiting. Start on the third day after chemotherapy. 30 tablet 1   polyethylene glycol (MIRALAX / GLYCOLAX) 17 g packet Take 17 g by mouth daily as needed for moderate constipation.     prochlorperazine (COMPAZINE) 10 MG tablet Take 1 tablet (10 mg total) by mouth every 6 (six) hours as needed for nausea or vomiting. 30 tablet 1   rosuvastatin (CRESTOR) 5 MG tablet TAKE 1 TABLET EVERY DAY (Patient taking differently: Take 5 mg by mouth at bedtime.) 90 tablet 3   sucralfate (CARAFATE) 1 g tablet Take 1 tablet (1 g total) by mouth 4 (four) times daily. Dissolve each tablet in 15 cc water before use. 120 tablet 2   sulfamethoxazole-trimethoprim (BACTRIM DS) 800-160 MG tablet Take 1 tablet by mouth 2 (two) times daily. 6 tablet 0   VITAMIN D PO Take 2 tablets by mouth daily.     No current facility-administered medications for this visit.    SURGICAL HISTORY:  Past Surgical History:  Procedure Laterality Date   CATARACT EXTRACTION Bilateral 2023   LUNG REMOVAL, PARTIAL  03/29/2020   middle lobe removed   VIDEO BRONCHOSCOPY WITH ENDOBRONCHIAL ULTRASOUND N/A 07/20/2022   Procedure: VIDEO BRONCHOSCOPY WITH ENDOBRONCHIAL ULTRASOUND;  Surgeon: [name], MD;  Location: [facility] OR;  Service: Thoracic;  Laterality: N/A;    REVIEW OF SYSTEMS:  Constitutional: positive for anorexia, fatigue, and weight loss Eyes: negative Ears, nose, mouth, throat, and face: negative Respiratory: positive for cough Cardiovascular:  negative Gastrointestinal: positive for odynophagia Genitourinary:negative Integument/breast: negative Hematologic/lymphatic: negative Musculoskeletal:negative Neurological: negative Behavioral/Psych: negative Endocrine: negative Allergic/Immunologic: negative   PHYSICAL EXAMINATION: General appearance: alert, cooperative, fatigued, and no distress Head: Normocephalic, without obvious abnormality, atraumatic Neck: no adenopathy, no JVD, supple, symmetrical, trachea midline, and thyroid not enlarged, symmetric, no tenderness/mass/nodules Lymph nodes: Cervical, supraclavicular, and axillary nodes normal. Resp: clear to auscultation bilaterally Back: symmetric, no curvature. ROM normal. No CVA tenderness. Cardio: regular rate and rhythm, S1, S2 normal, no murmur, click, rub or gallop GI: soft, non-tender; bowel sounds normal; no masses,  no organomegaly Extremities: extremities normal, atraumatic, no cyanosis or edema Neurologic: Alert and oriented X 3, normal strength and tone. Normal symmetric reflexes. Normal coordination and gait  ECOG PERFORMANCE STATUS: 1 - Symptomatic but completely ambulatory  Blood pressure (!) 125/56, pulse (!) 121, temperature 97.6 F (36.4 C), temperature source Oral, resp. rate 17, height 5\' 7"  (1.702 m), weight 151 lb 6.4 oz (68.7 kg),  SpO2 100%.  LABORATORY DATA: Lab Results  Component Value Date   WBC 1.4 (L) 09/11/2022   HGB 12.6 09/11/2022   HCT 38.2 09/11/2022   MCV 81.4 09/11/2022   PLT 133 (L) 09/11/2022      Chemistry      Component Value Date/Time   NA 130 (L) 09/10/2022 1327   NA 139 08/22/2022 1146   K 3.5 09/10/2022 1327   CL 99 09/10/2022 1327   CO2 21 (L) 09/10/2022 1327   BUN 18 09/10/2022 1327   BUN 10 08/22/2022 1146   CREATININE 0.85 09/10/2022 1327   CREATININE 1.05 (H) 09/04/2022 1317      Component Value Date/Time   CALCIUM 8.4 (L) 09/10/2022 1327   ALKPHOS 50 09/10/2022 1327   AST 37 09/10/2022 1327   AST 31  09/04/2022 1317   ALT 63 (H) 09/10/2022 1327   ALT 49 (H) 09/04/2022 1317   BILITOT 1.3 (H) 09/10/2022 1327   BILITOT 0.4 09/04/2022 1317       RADIOGRAPHIC STUDIES: CT ABDOMEN PELVIS W CONTRAST  Result Date: 09/10/2022 CLINICAL DATA:  Right upper quadrant pain for 2-3 days, breast cancer, ongoing chemotherapy * Tracking Code: BO * EXAM: CT ABDOMEN AND PELVIS WITH CONTRAST TECHNIQUE: Multidetector CT imaging of the abdomen and pelvis was performed using the standard protocol following bolus administration of intravenous contrast. RADIATION DOSE REDUCTION: This exam was performed according to the departmental dose-optimization program which includes automated exposure control, adjustment of the mA and/or kV according to patient size and/or use of iterative reconstruction technique. CONTRAST:  OMNIPAQUE IOHEXOL 300 MG/ML  SOLN COMPARISON:  PET-CT, 07/03/2022 FINDINGS: Lower chest: No acute abnormality. Hepatobiliary: No solid liver abnormality is seen. Hepatic steatosis. No gallstones, gallbladder wall thickening, or biliary dilatation. Pancreas: Unremarkable. No pancreatic ductal dilatation or surrounding inflammatory changes. Spleen: Normal in size without significant abnormality. Adrenals/Urinary Tract: Adrenal glands are unremarkable. Kidneys are normal, without renal calculi, solid lesion, or hydronephrosis. Bladder is unremarkable. Stomach/Bowel: Stomach is within normal limits. Appendix appears normal. No evidence of bowel wall thickening, distention, or inflammatory changes. Vascular/Lymphatic: Aortic atherosclerosis. No enlarged abdominal or pelvic lymph nodes. Reproductive: No mass or other significant abnormality. Pessary in the vagina. Other: No abdominal wall hernia or abnormality. No ascites. Musculoskeletal: No acute or significant osseous findings. IMPRESSION: 1. No acute CT findings of the abdomen or pelvis to explain right upper quadrant pain. 2. Hepatic steatosis. 3. No evidence of  lymphadenopathy or metastatic disease in the abdomen or pelvis. Aortic Atherosclerosis (ICD10-I70.0). Electronically Signed   By: Jearld Lesch M.D.   On: 09/10/2022 17:38   DG Chest Port 1 View  Result Date: 09/10/2022 CLINICAL DATA:  Left upper quadrant pain and chest pain. History of lung cancer with radiation therapy. EXAM: PORTABLE CHEST 1 VIEW COMPARISON:  07/18/22 FINDINGS: Heart size is normal. Postoperative changes from right middle lobectomy. No signs of pleural effusion, interstitial edema, or airspace disease. 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 Visualized osseous structures are unremarkable. IMPRESSION: 1. No acute cardiopulmonary disease. 2. Status post right middle lobectomy. 3. COPD/emphysema. Electronically Signed   By: Signa Kell M.D.   On: 09/10/2022 13:54   MR Brain W Wo Contrast  Result Date: 08/15/2022 CLINICAL DATA:  Metastatic disease evaluation, malignant neoplasm of the lung EXAM: MRI HEAD WITHOUT AND WITH CONTRAST TECHNIQUE: Multiplanar, multiecho pulse sequences of the brain and surrounding structures were obtained without and with intravenous contrast. CONTRAST:  7 mL Vueway COMPARISON:  None Available.  FINDINGS: Brain: No restricted diffusion to suggest acute or subacute infarct. No abnormal parenchymal or meningeal enhancement. No acute hemorrhage, mass, mass effect, or midline shift. No hydrocephalus or extra-axial collection. Normal pituitary and craniocervical junction. No hemosiderin deposition to suggest remote hemorrhage. Normal cerebral volume for age. Scattered T2 hyperintense signal in the periventricular white matter, likely the sequela of mild chronic small vessel ischemic disease. Vascular: Normal arterial flow voids. Normal arterial and venous enhancement. Incidental note is made of a high riding left jugular bulb. Skull and upper cervical spine: Normal marrow signal. Sinuses/Orbits: Clear paranasal sinuses. No acute finding in the  orbits. Status post bilateral lens replacements. Other: Trace fluid in left mastoid air cells. IMPRESSION: No acute intracranial process. No evidence of metastatic disease in the brain. Electronically Signed   By: Wiliam Ke M.D.   On: 08/15/2022 13:29    ASSESSMENT AND PLAN: This is a very pleasant 72 years old white female with  stage IIIA (T2a, N2, M0) non-small cell lung cancer, adenocarcinoma that was initially diagnosed as a stage Ib (T2a, N0, M0) non-small cell lung cancer, adenocarcinoma status post right middle lobectomy on March 29, 2020 and the patient had evidence for disease recurrence in the mediastinal lymph nodes in May 2024.  Molecular studies by foundation 1 showed positive KRAS G12C mutation and PD-L1 expression of 100%. The patient is currently undergoing a course of concurrent chemoradiation with weekly carboplatin for AUC of 2 and paclitaxel 45 Mg/M2 status post 5 cycles.   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 She is currently on several medications for the odynophagia including Carafate, Hycet as well as Maalox.  I recommended for her to discuss with Dr. Mitzi Hansen adjustment of her radiation treatment to avoid any further odynophagia. For the dehydration and weakness, I will arrange for the patient to receive 1 L of normal saline with Zofran IV today. For the leukocytopenia, I will cancel her chemotherapy dose today.  She will resume her treatment next week if she has recovery of her blood count and meets the parameters for treatment. The patient will come back for follow-up visit in 2 weeks for evaluation close to the end of her treatment. She was advised to call immediately if she has any other concerning symptoms in the interval. The patient voices understanding of current disease status and treatment options and is in agreement with the current care plan.  All questions were answered. The patient  knows to call the clinic with any problems, questions or concerns. We can certainly see the patient much sooner if necessary.  The total time spent in the appointment was 30 minutes.  Disclaimer: This note was dictated with voice recognition software. Similar sounding words can inadvertently be transcribed and may not be corrected upon review.

## 2022-09-12 ENCOUNTER — Telehealth: Payer: Self-pay

## 2022-09-12 ENCOUNTER — Ambulatory Visit
Admission: RE | Admit: 2022-09-12 | Discharge: 2022-09-12 | Disposition: A | Discharge status: Home / Self Care | Payer: Medicare HMO | Source: Ambulatory Visit | Attending: Radiation Oncology | Admitting: Radiation Oncology

## 2022-09-12 ENCOUNTER — Other Ambulatory Visit: Payer: Self-pay

## 2022-09-12 DIAGNOSIS — N390 Urinary tract infection, site not specified: Secondary | ICD-10-CM | POA: Diagnosis not present

## 2022-09-12 DIAGNOSIS — E860 Dehydration: Secondary | ICD-10-CM | POA: Diagnosis not present

## 2022-09-12 DIAGNOSIS — C342 Malignant neoplasm of middle lobe, bronchus or lung: Secondary | ICD-10-CM | POA: Diagnosis not present

## 2022-09-12 DIAGNOSIS — Z5111 Encounter for antineoplastic chemotherapy: Secondary | ICD-10-CM | POA: Diagnosis not present

## 2022-09-12 DIAGNOSIS — Z87891 Personal history of nicotine dependence: Secondary | ICD-10-CM | POA: Diagnosis not present

## 2022-09-12 DIAGNOSIS — Z510 Encounter for antineoplastic radiation therapy: Secondary | ICD-10-CM | POA: Diagnosis not present

## 2022-09-12 DIAGNOSIS — C3491 Malignant neoplasm of unspecified part of right bronchus or lung: Secondary | ICD-10-CM | POA: Diagnosis not present

## 2022-09-12 LAB — RAD ONC ARIA SESSION SUMMARY
Course Elapsed Days: 36
Plan Fractions Treated to Date: 24
Plan Prescribed Dose Per Fraction: 2 Gy
Plan Total Fractions Prescribed: 30
Plan Total Prescribed Dose: 60 Gy
Reference Point Dosage Given to Date: 48 Gy
Reference Point Session Dosage Given: 2 Gy
Session Number: 24

## 2022-09-12 NOTE — Telephone Encounter (Signed)
Transition Care Management Unsuccessful Follow-up Telephone Call  Date of discharge and from where:  09/10/2022 [facility]  Attempts:  1st Attempt  Reason for unsuccessful TCM follow-up call:  Left voice message  Nurah Petrides [facility]  [facility] Population Health Community Resource Care Guide   [email or web address]  ?? [phone]   Website: [email or web address]  [email or web address]

## 2022-09-12 NOTE — Telephone Encounter (Signed)
Transition Care Management Unsuccessful Follow-up Telephone Call  Date of discharge and from where:  09/10/2022 [facility]  Attempts:  2nd Attempt  Reason for unsuccessful TCM follow-up call:  Left voice message  Jeanet Lupe [facility]  [facility] Population Health Community Resource Care Guide   [email or web address]  ?? [phone]   Website: [email or web address]  [email or web address]

## 2022-09-13 ENCOUNTER — Inpatient Hospital Stay: Payer: Medicare HMO

## 2022-09-13 ENCOUNTER — Ambulatory Visit
Admission: RE | Admit: 2022-09-13 | Discharge: 2022-09-13 | Disposition: A | Discharge status: Home / Self Care | Payer: Medicare HMO | Source: Ambulatory Visit | Attending: Radiation Oncology | Admitting: Radiation Oncology

## 2022-09-13 ENCOUNTER — Other Ambulatory Visit: Payer: Self-pay

## 2022-09-13 ENCOUNTER — Other Ambulatory Visit: Payer: Self-pay | Admitting: Radiation Oncology

## 2022-09-13 DIAGNOSIS — Z5111 Encounter for antineoplastic chemotherapy: Secondary | ICD-10-CM | POA: Diagnosis not present

## 2022-09-13 DIAGNOSIS — C3491 Malignant neoplasm of unspecified part of right bronchus or lung: Secondary | ICD-10-CM

## 2022-09-13 DIAGNOSIS — E860 Dehydration: Secondary | ICD-10-CM | POA: Diagnosis not present

## 2022-09-13 DIAGNOSIS — Z510 Encounter for antineoplastic radiation therapy: Secondary | ICD-10-CM | POA: Diagnosis not present

## 2022-09-13 DIAGNOSIS — Z87891 Personal history of nicotine dependence: Secondary | ICD-10-CM | POA: Diagnosis not present

## 2022-09-13 DIAGNOSIS — C342 Malignant neoplasm of middle lobe, bronchus or lung: Secondary | ICD-10-CM | POA: Diagnosis not present

## 2022-09-13 DIAGNOSIS — N390 Urinary tract infection, site not specified: Secondary | ICD-10-CM | POA: Diagnosis not present

## 2022-09-13 LAB — RAD ONC ARIA SESSION SUMMARY
Course Elapsed Days: 37
Plan Fractions Treated to Date: 25
Plan Prescribed Dose Per Fraction: 2 Gy
Plan Total Fractions Prescribed: 30
Plan Total Prescribed Dose: 60 Gy
Reference Point Dosage Given to Date: 50 Gy
Reference Point Session Dosage Given: 2 Gy
Session Number: 25

## 2022-09-13 MED ORDER — SODIUM CHLORIDE 0.9 % IV SOLN: Freq: Once | INTRAVENOUS | Status: AC

## 2022-09-14 ENCOUNTER — Other Ambulatory Visit: Payer: Self-pay

## 2022-09-14 ENCOUNTER — Ambulatory Visit
Admission: RE | Admit: 2022-09-14 | Discharge: 2022-09-14 | Disposition: A | Discharge status: Home / Self Care | Payer: Medicare HMO | Source: Ambulatory Visit | Attending: Radiation Oncology | Admitting: Radiation Oncology

## 2022-09-14 ENCOUNTER — Other Ambulatory Visit: Payer: Self-pay | Admitting: Radiation Oncology

## 2022-09-14 ENCOUNTER — Telehealth: Payer: Self-pay

## 2022-09-14 DIAGNOSIS — Z5111 Encounter for antineoplastic chemotherapy: Secondary | ICD-10-CM | POA: Diagnosis not present

## 2022-09-14 DIAGNOSIS — C342 Malignant neoplasm of middle lobe, bronchus or lung: Secondary | ICD-10-CM | POA: Diagnosis not present

## 2022-09-14 DIAGNOSIS — Z510 Encounter for antineoplastic radiation therapy: Secondary | ICD-10-CM | POA: Diagnosis not present

## 2022-09-14 DIAGNOSIS — N390 Urinary tract infection, site not specified: Secondary | ICD-10-CM | POA: Diagnosis not present

## 2022-09-14 DIAGNOSIS — Z87891 Personal history of nicotine dependence: Secondary | ICD-10-CM | POA: Diagnosis not present

## 2022-09-14 DIAGNOSIS — C3491 Malignant neoplasm of unspecified part of right bronchus or lung: Secondary | ICD-10-CM | POA: Diagnosis not present

## 2022-09-14 DIAGNOSIS — E860 Dehydration: Secondary | ICD-10-CM | POA: Diagnosis not present

## 2022-09-14 LAB — RAD ONC ARIA SESSION SUMMARY
Course Elapsed Days: 38
Plan Fractions Treated to Date: 26
Plan Prescribed Dose Per Fraction: 2 Gy
Plan Total Fractions Prescribed: 30
Plan Total Prescribed Dose: 60 Gy
Reference Point Dosage Given to Date: 52 Gy
Reference Point Session Dosage Given: 2 Gy
Session Number: 26

## 2022-09-14 MED ORDER — HYDROCODONE-ACETAMINOPHEN 7.5-325 MG/15ML PO SOLN: 5.0000 mL | ORAL | 0 refills | Status: DC | PRN

## 2022-09-14 NOTE — Telephone Encounter (Signed)
TC from pt to request refill of Hycet. She has 2 doses left.

## 2022-09-15 ENCOUNTER — Ambulatory Visit
Admission: RE | Admit: 2022-09-15 | Discharge: 2022-09-15 | Disposition: A | Discharge status: Home / Self Care | Payer: Medicare HMO | Source: Ambulatory Visit | Attending: Radiation Oncology | Admitting: Radiation Oncology

## 2022-09-15 ENCOUNTER — Other Ambulatory Visit: Payer: Self-pay

## 2022-09-15 ENCOUNTER — Other Ambulatory Visit: Payer: Self-pay | Admitting: *Deleted

## 2022-09-15 ENCOUNTER — Inpatient Hospital Stay: Payer: Medicare HMO

## 2022-09-15 VITALS — BP 142/65 | HR 96 | Temp 97.8°F | Resp 16

## 2022-09-15 DIAGNOSIS — C342 Malignant neoplasm of middle lobe, bronchus or lung: Secondary | ICD-10-CM | POA: Diagnosis not present

## 2022-09-15 DIAGNOSIS — C3491 Malignant neoplasm of unspecified part of right bronchus or lung: Secondary | ICD-10-CM

## 2022-09-15 DIAGNOSIS — Z5111 Encounter for antineoplastic chemotherapy: Secondary | ICD-10-CM | POA: Diagnosis not present

## 2022-09-15 DIAGNOSIS — E860 Dehydration: Secondary | ICD-10-CM | POA: Diagnosis not present

## 2022-09-15 DIAGNOSIS — Z510 Encounter for antineoplastic radiation therapy: Secondary | ICD-10-CM | POA: Diagnosis not present

## 2022-09-15 DIAGNOSIS — N390 Urinary tract infection, site not specified: Secondary | ICD-10-CM | POA: Diagnosis not present

## 2022-09-15 DIAGNOSIS — Z87891 Personal history of nicotine dependence: Secondary | ICD-10-CM | POA: Diagnosis not present

## 2022-09-15 LAB — RAD ONC ARIA SESSION SUMMARY
Course Elapsed Days: 39
Plan Fractions Treated to Date: 27
Plan Prescribed Dose Per Fraction: 2 Gy
Plan Total Fractions Prescribed: 30
Plan Total Prescribed Dose: 60 Gy
Reference Point Dosage Given to Date: 54 Gy
Reference Point Session Dosage Given: 2 Gy
Session Number: 27

## 2022-09-15 MED ORDER — SODIUM CHLORIDE 0.9 % IV SOLN: INTRAVENOUS | Status: DC

## 2022-09-15 MED ORDER — SODIUM CHLORIDE 0.9 % IV SOLN: Freq: Once | INTRAVENOUS | Status: DC

## 2022-09-15 MED ORDER — ONDANSETRON HCL 4 MG/2ML IJ SOLN
8.0000 mg | Freq: Once | INTRAMUSCULAR | Status: AC
  Administered 2022-09-15: 8 mg via INTRAVENOUS
  Filled 2022-09-15: qty 4

## 2022-09-15 MED FILL — Dexamethasone Sodium Phosphate Inj 100 MG/10ML: INTRAMUSCULAR | Qty: 1 | Status: AC

## 2022-09-15 NOTE — Patient Instructions (Signed)

## 2022-09-18 ENCOUNTER — Inpatient Hospital Stay: Payer: Medicare HMO

## 2022-09-18 ENCOUNTER — Other Ambulatory Visit: Payer: Self-pay | Admitting: Physician Assistant

## 2022-09-18 ENCOUNTER — Other Ambulatory Visit: Payer: Self-pay

## 2022-09-18 ENCOUNTER — Telehealth: Payer: Self-pay | Admitting: *Deleted

## 2022-09-18 ENCOUNTER — Ambulatory Visit
Admission: RE | Admit: 2022-09-18 | Discharge: 2022-09-18 | Disposition: A | Discharge status: Home / Self Care | Payer: Medicare HMO | Source: Ambulatory Visit | Attending: Radiation Oncology | Admitting: Radiation Oncology

## 2022-09-18 ENCOUNTER — Encounter: Payer: Self-pay | Admitting: Internal Medicine

## 2022-09-18 VITALS — BP 139/78 | HR 110 | Temp 97.6°F | Resp 18

## 2022-09-18 DIAGNOSIS — R112 Nausea with vomiting, unspecified: Secondary | ICD-10-CM

## 2022-09-18 DIAGNOSIS — Z510 Encounter for antineoplastic radiation therapy: Secondary | ICD-10-CM | POA: Diagnosis not present

## 2022-09-18 DIAGNOSIS — E860 Dehydration: Secondary | ICD-10-CM

## 2022-09-18 DIAGNOSIS — Z5111 Encounter for antineoplastic chemotherapy: Secondary | ICD-10-CM | POA: Diagnosis not present

## 2022-09-18 DIAGNOSIS — I959 Hypotension, unspecified: Secondary | ICD-10-CM

## 2022-09-18 DIAGNOSIS — C3491 Malignant neoplasm of unspecified part of right bronchus or lung: Secondary | ICD-10-CM | POA: Diagnosis not present

## 2022-09-18 DIAGNOSIS — C342 Malignant neoplasm of middle lobe, bronchus or lung: Secondary | ICD-10-CM | POA: Diagnosis not present

## 2022-09-18 DIAGNOSIS — E876 Hypokalemia: Secondary | ICD-10-CM

## 2022-09-18 DIAGNOSIS — N390 Urinary tract infection, site not specified: Secondary | ICD-10-CM | POA: Diagnosis not present

## 2022-09-18 DIAGNOSIS — Z87891 Personal history of nicotine dependence: Secondary | ICD-10-CM | POA: Diagnosis not present

## 2022-09-18 LAB — RAD ONC ARIA SESSION SUMMARY
Course Elapsed Days: 42
Plan Fractions Treated to Date: 28
Plan Prescribed Dose Per Fraction: 2 Gy
Plan Total Fractions Prescribed: 30
Plan Total Prescribed Dose: 60 Gy
Reference Point Dosage Given to Date: 56 Gy
Reference Point Session Dosage Given: 2 Gy
Session Number: 28

## 2022-09-18 LAB — CBC WITH DIFFERENTIAL (CANCER CENTER ONLY)
Abs Immature Granulocytes: 0.03 10*3/uL (ref 0.00–0.07)
Basophils Absolute: 0 10*3/uL (ref 0.0–0.1)
Basophils Relative: 1 %
Eosinophils Absolute: 0 10*3/uL (ref 0.0–0.5)
Eosinophils Relative: 0 %
HCT: 37.5 % (ref 36.0–46.0)
Hemoglobin: 12.6 g/dL (ref 12.0–15.0)
Immature Granulocytes: 1 %
Lymphocytes Relative: 18 %
Lymphs Abs: 0.4 10*3/uL — ABNORMAL LOW (ref 0.7–4.0)
MCH: 27.6 pg (ref 26.0–34.0)
MCHC: 33.6 g/dL (ref 30.0–36.0)
MCV: 82.1 fL (ref 80.0–100.0)
Monocytes Absolute: 0.4 10*3/uL (ref 0.1–1.0)
Monocytes Relative: 15 %
Neutro Abs: 1.5 10*3/uL — ABNORMAL LOW (ref 1.7–7.7)
Neutrophils Relative %: 65 %
Platelet Count: 242 10*3/uL (ref 150–400)
RBC: 4.57 MIL/uL (ref 3.87–5.11)
RDW: 19.9 % — ABNORMAL HIGH (ref 11.5–15.5)
WBC Count: 2.4 10*3/uL — ABNORMAL LOW (ref 4.0–10.5)
nRBC: 0 % (ref 0.0–0.2)

## 2022-09-18 LAB — CMP (CANCER CENTER ONLY)
ALT: 45 U/L — ABNORMAL HIGH (ref 0–44)
AST: 37 U/L (ref 15–41)
Albumin: 3.8 g/dL (ref 3.5–5.0)
Alkaline Phosphatase: 60 U/L (ref 38–126)
Anion gap: 13 (ref 5–15)
BUN: 10 mg/dL (ref 8–23)
CO2: 25 mmol/L (ref 22–32)
Calcium: 9.1 mg/dL (ref 8.9–10.3)
Chloride: 98 mmol/L (ref 98–111)
Creatinine: 0.74 mg/dL (ref 0.44–1.00)
GFR, Estimated: >60 mL/min (ref >60)
Glucose, Bld: 109 mg/dL — ABNORMAL HIGH (ref 70–99)
Potassium: 3.1 mmol/L — ABNORMAL LOW (ref 3.5–5.1)
Sodium: 136 mmol/L (ref 135–145)
Total Bilirubin: 0.5 mg/dL (ref 0.3–1.2)
Total Protein: 6.5 g/dL (ref 6.5–8.1)

## 2022-09-18 MED ORDER — FAMOTIDINE IN NACL 20-0.9 MG/50ML-% IV SOLN
20.0000 mg | Freq: Once | INTRAVENOUS | Status: AC
  Administered 2022-09-18: 20 mg via INTRAVENOUS
  Filled 2022-09-18: qty 50

## 2022-09-18 MED ORDER — HEPARIN SOD (PORK) LOCK FLUSH 100 UNIT/ML IV SOLN: 500.0000 [IU] | Freq: Once | INTRAVENOUS | Status: DC | PRN

## 2022-09-18 MED ORDER — DIPHENHYDRAMINE HCL 50 MG/ML IJ SOLN
50.0000 mg | Freq: Once | INTRAMUSCULAR | Status: AC
  Administered 2022-09-18: 50 mg via INTRAVENOUS
  Filled 2022-09-18: qty 1

## 2022-09-18 MED ORDER — SODIUM CHLORIDE 0.9 % IV SOLN
166.6000 mg | Freq: Once | INTRAVENOUS | Status: AC
  Administered 2022-09-18: 170 mg via INTRAVENOUS
  Filled 2022-09-18: qty 17

## 2022-09-18 MED ORDER — ONDANSETRON 8 MG PO TBDP
8.0000 mg | ORAL_TABLET | Freq: Three times a day (TID) | ORAL | 2 refills | Status: DC | PRN
Start: 2022-09-18 — End: 2022-10-19

## 2022-09-18 MED ORDER — SODIUM CHLORIDE 0.9 % IV SOLN: Freq: Once | INTRAVENOUS | Status: AC

## 2022-09-18 MED ORDER — PALONOSETRON HCL INJECTION 0.25 MG/5ML: 0.2500 mg | Freq: Once | INTRAVENOUS | Status: DC

## 2022-09-18 MED ORDER — POTASSIUM CHLORIDE CRYS ER 20 MEQ PO TBCR
20.0000 meq | EXTENDED_RELEASE_TABLET | Freq: Every day | ORAL | 0 refills | Status: DC
Start: 2022-09-18 — End: 2022-10-19

## 2022-09-18 MED ORDER — SODIUM CHLORIDE 0.9 % IV SOLN
10.0000 mg | Freq: Once | INTRAVENOUS | Status: AC
  Administered 2022-09-18: 10 mg via INTRAVENOUS
  Filled 2022-09-18: qty 10

## 2022-09-18 MED ORDER — SODIUM CHLORIDE 0.9 % IV SOLN: Freq: Once | INTRAVENOUS | Status: DC

## 2022-09-18 MED ORDER — SODIUM CHLORIDE 0.9 % IV SOLN
45.0000 mg/m2 | Freq: Once | INTRAVENOUS | Status: AC
  Administered 2022-09-18: 84 mg via INTRAVENOUS
  Filled 2022-09-18: qty 14

## 2022-09-18 MED ORDER — SODIUM CHLORIDE 0.9% FLUSH: 10.0000 mL | INTRAVENOUS | Status: DC | PRN

## 2022-09-18 MED ORDER — ONDANSETRON HCL 4 MG/2ML IJ SOLN
8.0000 mg | Freq: Once | INTRAMUSCULAR | Status: AC
  Administered 2022-09-18: 8 mg via INTRAVENOUS
  Filled 2022-09-18: qty 4

## 2022-09-18 NOTE — Progress Notes (Signed)
Give zofran 8mg  plus aloxi today per [place], PA

## 2022-09-18 NOTE — Progress Notes (Signed)
Per Cassie PA, ok to treat today with elevated HR. Pt to get 1L NS with treatment today.

## 2022-09-18 NOTE — Progress Notes (Signed)
Nutrition Assessment   Reason for Assessment:   Patient identified on Malnutrition Screening report for weight loss and poor appetite   ASSESSMENT:  72 year old female with recurrent non small cell lung cancer.  Past medical history of hiatal hernia, lobectomy 03/29/20, COPD.  Patient receiving concurrent chemotherapy and radiation.   Met with patient and daughter during infusion.  Patient reports that yesterday she could not keep anything down (food or medication).  Patient has been prescribed zofran ODT and will pick it up today.  Has been drinking [employer]life shakes.  Daughter and patient tearful during visit.     Medications: carafate, prilosec, compazine, miralax, MVI, decadron, hycet,    Labs: K 3.1, glucose 109   Anthropometrics:   Height: 67 inches Weight: 151 lb 6.4 oz  158 lb on 7/15 161 lb on 6/17 163 lb on 5/21 BMI: 23 7% weight loss in the last 2 months  Estimated Energy Needs  Kcals: 1700-2040 Protein: 85-102 g Fluid: 1700-2040 ml   NUTRITION DIAGNOSIS: Inadequate oral intake related to cancer and related treatment side effects as evidenced by 7% weight loss in the last 2 months   INTERVENTION:  Patient will start zofran ODT Discussed small frequent sips of liquid/nibbles.  Discussed foods to choose with nausea.  Handout provided Sample of ensure clear provided as well along with coupons Encouraged patient to call [facility] if nausea medication is not working.   MONITORING, EVALUATION, GOAL: weight trends, intake   Next Visit: phone f/u Monday, August 12   Devynn Scheff B. Freida Busman, RD, LDN Registered Dietitian [phone]

## 2022-09-18 NOTE — Telephone Encounter (Signed)
Patient called. States she is staying very nauseated. Has thrown up whenever she tries to take any medicine. Ms. Foos asked if she can have prescription for Zofran ODT that melts under the tongue?  C.Heilingoetter, PA informed. Prescription to be sent once pharmacy is confirmed with patient

## 2022-09-18 NOTE — Patient Instructions (Signed)
[facility]  Discharge Instructions: Thank you for choosing [facility] to provide your oncology and hematology care.   If you have a lab appointment with the Cancer Center, please go directly to the Cancer Center and check in at the registration area.   Wear comfortable clothing and clothing appropriate for easy access to any Portacath or PICC line.   We strive to give you quality time with your provider. You may need to reschedule your appointment if you arrive late (15 or more minutes).  Arriving late affects you and other patients whose appointments are after yours.  Also, if you miss three or more appointments without notifying the office, you may be dismissed from the clinic at the provider's discretion.      For prescription refill requests, have your pharmacy contact our office and allow 72 hours for refills to be completed.    Today you received the following chemotherapy and/or immunotherapy agents: Paclitaxel, Carboplatin.       To help prevent nausea and vomiting after your treatment, we encourage you to take your nausea medication as directed.  BELOW ARE SYMPTOMS THAT SHOULD BE REPORTED IMMEDIATELY: *FEVER GREATER THAN 100.4 F (38 C) OR HIGHER *CHILLS OR SWEATING *NAUSEA AND VOMITING THAT IS NOT CONTROLLED WITH YOUR NAUSEA MEDICATION *UNUSUAL SHORTNESS OF BREATH *UNUSUAL BRUISING OR BLEEDING *URINARY PROBLEMS (pain or burning when urinating, or frequent urination) *BOWEL PROBLEMS (unusual diarrhea, constipation, pain near the anus) TENDERNESS IN MOUTH AND THROAT WITH OR WITHOUT PRESENCE OF ULCERS (sore throat, sores in mouth, or a toothache) UNUSUAL RASH, SWELLING OR PAIN  UNUSUAL VAGINAL DISCHARGE OR ITCHING   Items with * indicate a potential emergency and should be followed up as soon as possible or go to the Emergency Department if any problems should occur.  Please show the CHEMOTHERAPY ALERT CARD or IMMUNOTHERAPY  ALERT CARD at check-in to the Emergency Department and triage nurse.  Should you have questions after your visit or need to cancel or reschedule your appointment, please contact [facility]  Dept: [phone]  and follow the prompts.  Office hours are 8:00 a.m. to 4:30 p.m. Monday - Friday. Please note that voicemails left after 4:00 p.m. may not be returned until the following business day.  We are closed weekends and major holidays. You have access to a nurse at all times for urgent questions. Please call the main number to the clinic Dept: [phone] and follow the prompts.   For any non-urgent questions, you may also contact your provider using MyChart. We now offer e-Visits for anyone 18 and older to request care online for non-urgent symptoms. For details visit [email or web address].   Also download the MyChart app! Go to the app store, search "MyChart", open the app, select [facility], and log in with your MyChart username and password.   

## 2022-09-19 ENCOUNTER — Ambulatory Visit
Admission: RE | Admit: 2022-09-19 | Discharge: 2022-09-19 | Disposition: A | Discharge status: Home / Self Care | Payer: Medicare HMO | Source: Ambulatory Visit | Attending: Radiation Oncology | Admitting: Radiation Oncology

## 2022-09-19 ENCOUNTER — Ambulatory Visit: Payer: Medicare HMO

## 2022-09-19 ENCOUNTER — Other Ambulatory Visit: Payer: Self-pay

## 2022-09-19 DIAGNOSIS — Z510 Encounter for antineoplastic radiation therapy: Secondary | ICD-10-CM | POA: Diagnosis not present

## 2022-09-19 DIAGNOSIS — N390 Urinary tract infection, site not specified: Secondary | ICD-10-CM | POA: Diagnosis not present

## 2022-09-19 DIAGNOSIS — C342 Malignant neoplasm of middle lobe, bronchus or lung: Secondary | ICD-10-CM | POA: Diagnosis not present

## 2022-09-19 DIAGNOSIS — Z5111 Encounter for antineoplastic chemotherapy: Secondary | ICD-10-CM | POA: Diagnosis not present

## 2022-09-19 DIAGNOSIS — C3491 Malignant neoplasm of unspecified part of right bronchus or lung: Secondary | ICD-10-CM | POA: Diagnosis not present

## 2022-09-19 DIAGNOSIS — E860 Dehydration: Secondary | ICD-10-CM | POA: Diagnosis not present

## 2022-09-19 DIAGNOSIS — Z87891 Personal history of nicotine dependence: Secondary | ICD-10-CM | POA: Diagnosis not present

## 2022-09-19 LAB — RAD ONC ARIA SESSION SUMMARY
Course Elapsed Days: 43
Plan Fractions Treated to Date: 29
Plan Prescribed Dose Per Fraction: 2 Gy
Plan Total Fractions Prescribed: 30
Plan Total Prescribed Dose: 60 Gy
Reference Point Dosage Given to Date: 58 Gy
Reference Point Session Dosage Given: 2 Gy
Session Number: 29

## 2022-09-20 ENCOUNTER — Other Ambulatory Visit: Payer: Self-pay | Admitting: Internal Medicine

## 2022-09-20 ENCOUNTER — Other Ambulatory Visit: Payer: Self-pay

## 2022-09-20 ENCOUNTER — Ambulatory Visit
Admission: RE | Admit: 2022-09-20 | Discharge: 2022-09-20 | Disposition: A | Discharge status: Home / Self Care | Payer: Medicare HMO | Source: Ambulatory Visit | Attending: Radiation Oncology | Admitting: Radiation Oncology

## 2022-09-20 ENCOUNTER — Ambulatory Visit: Payer: Medicare HMO

## 2022-09-20 ENCOUNTER — Inpatient Hospital Stay: Payer: Medicare HMO

## 2022-09-20 VITALS — BP 129/80 | HR 101 | Temp 98.2°F | Resp 18

## 2022-09-20 DIAGNOSIS — C3491 Malignant neoplasm of unspecified part of right bronchus or lung: Secondary | ICD-10-CM

## 2022-09-20 DIAGNOSIS — E860 Dehydration: Secondary | ICD-10-CM | POA: Diagnosis not present

## 2022-09-20 DIAGNOSIS — R109 Unspecified abdominal pain: Secondary | ICD-10-CM

## 2022-09-20 DIAGNOSIS — C342 Malignant neoplasm of middle lobe, bronchus or lung: Secondary | ICD-10-CM | POA: Diagnosis not present

## 2022-09-20 DIAGNOSIS — Z5111 Encounter for antineoplastic chemotherapy: Secondary | ICD-10-CM | POA: Diagnosis not present

## 2022-09-20 DIAGNOSIS — N390 Urinary tract infection, site not specified: Secondary | ICD-10-CM | POA: Diagnosis not present

## 2022-09-20 DIAGNOSIS — Z510 Encounter for antineoplastic radiation therapy: Secondary | ICD-10-CM | POA: Diagnosis not present

## 2022-09-20 DIAGNOSIS — Z87891 Personal history of nicotine dependence: Secondary | ICD-10-CM | POA: Diagnosis not present

## 2022-09-20 LAB — RAD ONC ARIA SESSION SUMMARY
Course Elapsed Days: 44
Plan Fractions Treated to Date: 30
Plan Prescribed Dose Per Fraction: 2 Gy
Plan Total Fractions Prescribed: 30
Plan Total Prescribed Dose: 60 Gy
Reference Point Dosage Given to Date: 60 Gy
Reference Point Session Dosage Given: 2 Gy
Session Number: 30

## 2022-09-20 MED ORDER — SODIUM CHLORIDE 0.9 % IV SOLN: Freq: Once | INTRAVENOUS | Status: DC

## 2022-09-20 MED ORDER — SODIUM CHLORIDE 0.9 % IV SOLN: Freq: Once | INTRAVENOUS | Status: AC

## 2022-09-20 MED ORDER — ONDANSETRON HCL 4 MG/2ML IJ SOLN
8.0000 mg | Freq: Once | INTRAMUSCULAR | Status: AC
  Administered 2022-09-20: 8 mg via INTRAVENOUS
  Filled 2022-09-20: qty 4

## 2022-09-20 MED ORDER — MORPHINE SULFATE (PF) 2 MG/ML IV SOLN
1.0000 mg | Freq: Once | INTRAVENOUS | Status: AC
  Administered 2022-09-20: 1 mg via INTRAVENOUS
  Filled 2022-09-20: qty 1

## 2022-09-21 ENCOUNTER — Ambulatory Visit: Payer: Medicare HMO

## 2022-09-21 ENCOUNTER — Ambulatory Visit
Admission: RE | Admit: 2022-09-21 | Discharge: 2022-09-21 | Disposition: A | Discharge status: Home / Self Care | Payer: Medicare HMO | Source: Ambulatory Visit | Attending: Radiation Oncology | Admitting: Radiation Oncology

## 2022-09-21 ENCOUNTER — Other Ambulatory Visit: Payer: Self-pay

## 2022-09-21 ENCOUNTER — Telehealth: Payer: Self-pay

## 2022-09-21 DIAGNOSIS — N390 Urinary tract infection, site not specified: Secondary | ICD-10-CM | POA: Insufficient documentation

## 2022-09-21 DIAGNOSIS — C3491 Malignant neoplasm of unspecified part of right bronchus or lung: Secondary | ICD-10-CM | POA: Insufficient documentation

## 2022-09-21 DIAGNOSIS — E860 Dehydration: Secondary | ICD-10-CM | POA: Insufficient documentation

## 2022-09-21 DIAGNOSIS — Z510 Encounter for antineoplastic radiation therapy: Secondary | ICD-10-CM | POA: Diagnosis not present

## 2022-09-21 DIAGNOSIS — Z87891 Personal history of nicotine dependence: Secondary | ICD-10-CM | POA: Diagnosis not present

## 2022-09-21 DIAGNOSIS — E872 Acidosis, unspecified: Secondary | ICD-10-CM | POA: Diagnosis not present

## 2022-09-21 DIAGNOSIS — C342 Malignant neoplasm of middle lobe, bronchus or lung: Secondary | ICD-10-CM | POA: Diagnosis not present

## 2022-09-21 DIAGNOSIS — Z79899 Other long term (current) drug therapy: Secondary | ICD-10-CM | POA: Diagnosis not present

## 2022-09-21 DIAGNOSIS — E876 Hypokalemia: Secondary | ICD-10-CM | POA: Diagnosis not present

## 2022-09-21 DIAGNOSIS — Z5111 Encounter for antineoplastic chemotherapy: Secondary | ICD-10-CM | POA: Insufficient documentation

## 2022-09-21 LAB — RAD ONC ARIA SESSION SUMMARY
Course Elapsed Days: 45
Plan Fractions Treated to Date: 1
Plan Prescribed Dose Per Fraction: 2 Gy
Plan Total Fractions Prescribed: 3
Plan Total Prescribed Dose: 6 Gy
Reference Point Dosage Given to Date: 2 Gy
Reference Point Session Dosage Given: 2 Gy
Session Number: 31

## 2022-09-21 NOTE — Telephone Encounter (Signed)
Patient called to clarify upcoming appointments.  Confirmed all appointment times on Monday and confirmed that, per Dr. Arbutus Ped, she would not have to make up previously deferred chemotherapy appointment. Patient will be finishing radiation and chemotherapy on Monday. Patient verbalized an understanding and appreciation for the call.

## 2022-09-22 ENCOUNTER — Ambulatory Visit
Admission: RE | Admit: 2022-09-22 | Discharge: 2022-09-22 | Disposition: A | Discharge status: Home / Self Care | Payer: Medicare HMO | Source: Ambulatory Visit | Attending: Radiation Oncology | Admitting: Radiation Oncology

## 2022-09-22 ENCOUNTER — Other Ambulatory Visit: Payer: Self-pay

## 2022-09-22 ENCOUNTER — Inpatient Hospital Stay: Payer: Medicare HMO | Attending: Radiation Oncology

## 2022-09-22 ENCOUNTER — Ambulatory Visit: Payer: Medicare HMO

## 2022-09-22 VITALS — BP 139/77 | HR 96 | Resp 18

## 2022-09-22 DIAGNOSIS — E876 Hypokalemia: Secondary | ICD-10-CM | POA: Insufficient documentation

## 2022-09-22 DIAGNOSIS — Z510 Encounter for antineoplastic radiation therapy: Secondary | ICD-10-CM | POA: Diagnosis not present

## 2022-09-22 DIAGNOSIS — Z79899 Other long term (current) drug therapy: Secondary | ICD-10-CM | POA: Insufficient documentation

## 2022-09-22 DIAGNOSIS — E872 Acidosis, unspecified: Secondary | ICD-10-CM | POA: Insufficient documentation

## 2022-09-22 DIAGNOSIS — C342 Malignant neoplasm of middle lobe, bronchus or lung: Secondary | ICD-10-CM | POA: Insufficient documentation

## 2022-09-22 DIAGNOSIS — E860 Dehydration: Secondary | ICD-10-CM

## 2022-09-22 LAB — RAD ONC ARIA SESSION SUMMARY
Course Elapsed Days: 46
Plan Fractions Treated to Date: 2
Plan Prescribed Dose Per Fraction: 2 Gy
Plan Total Fractions Prescribed: 3
Plan Total Prescribed Dose: 6 Gy
Reference Point Dosage Given to Date: 4 Gy
Reference Point Session Dosage Given: 2 Gy
Session Number: 32

## 2022-09-22 MED ORDER — ONDANSETRON HCL 4 MG/2ML IJ SOLN
8.0000 mg | Freq: Once | INTRAMUSCULAR | Status: AC
  Administered 2022-09-22: 8 mg via INTRAVENOUS
  Filled 2022-09-22: qty 4

## 2022-09-22 MED ORDER — SODIUM CHLORIDE 0.9 % IV SOLN: Freq: Once | INTRAVENOUS | Status: AC

## 2022-09-22 MED ORDER — SODIUM CHLORIDE 0.9 % IV SOLN: Freq: Once | INTRAVENOUS | Status: DC

## 2022-09-22 MED FILL — Dexamethasone Sodium Phosphate Inj 100 MG/10ML: INTRAMUSCULAR | Qty: 1 | Status: AC

## 2022-09-22 NOTE — Progress Notes (Signed)
Ok to run IVF over 1 hour per Williams PA

## 2022-09-22 NOTE — Patient Instructions (Signed)

## 2022-09-25 ENCOUNTER — Other Ambulatory Visit: Payer: Self-pay

## 2022-09-25 ENCOUNTER — Telehealth: Payer: Self-pay | Admitting: Internal Medicine

## 2022-09-25 ENCOUNTER — Inpatient Hospital Stay: Payer: Medicare HMO

## 2022-09-25 ENCOUNTER — Inpatient Hospital Stay (HOSPITAL_BASED_OUTPATIENT_CLINIC_OR_DEPARTMENT_OTHER): Payer: Medicare HMO | Admitting: Internal Medicine

## 2022-09-25 ENCOUNTER — Ambulatory Visit: Admission: RE | Admit: 2022-09-25 | Payer: Medicare HMO | Source: Ambulatory Visit

## 2022-09-25 VITALS — BP 94/66 | HR 130 | Temp 97.4°F | Resp 16 | Ht 67.0 in | Wt 142.1 lb

## 2022-09-25 DIAGNOSIS — E876 Hypokalemia: Secondary | ICD-10-CM | POA: Diagnosis not present

## 2022-09-25 DIAGNOSIS — R112 Nausea with vomiting, unspecified: Secondary | ICD-10-CM

## 2022-09-25 DIAGNOSIS — C3491 Malignant neoplasm of unspecified part of right bronchus or lung: Secondary | ICD-10-CM

## 2022-09-25 DIAGNOSIS — I959 Hypotension, unspecified: Secondary | ICD-10-CM

## 2022-09-25 DIAGNOSIS — Z79899 Other long term (current) drug therapy: Secondary | ICD-10-CM | POA: Diagnosis not present

## 2022-09-25 DIAGNOSIS — C3490 Malignant neoplasm of unspecified part of unspecified bronchus or lung: Secondary | ICD-10-CM

## 2022-09-25 DIAGNOSIS — C342 Malignant neoplasm of middle lobe, bronchus or lung: Secondary | ICD-10-CM | POA: Diagnosis not present

## 2022-09-25 DIAGNOSIS — Z510 Encounter for antineoplastic radiation therapy: Secondary | ICD-10-CM | POA: Diagnosis not present

## 2022-09-25 DIAGNOSIS — E872 Acidosis, unspecified: Secondary | ICD-10-CM | POA: Diagnosis not present

## 2022-09-25 DIAGNOSIS — E860 Dehydration: Secondary | ICD-10-CM

## 2022-09-25 LAB — CBC WITH DIFFERENTIAL (CANCER CENTER ONLY)
Abs Immature Granulocytes: 0.04 10*3/uL (ref 0.00–0.07)
Basophils Absolute: 0 10*3/uL (ref 0.0–0.1)
Basophils Relative: 1 %
Eosinophils Absolute: 0 10*3/uL (ref 0.0–0.5)
Eosinophils Relative: 1 %
HCT: 38 % (ref 36.0–46.0)
Hemoglobin: 12.8 g/dL (ref 12.0–15.0)
Immature Granulocytes: 1 %
Lymphocytes Relative: 18 %
Lymphs Abs: 0.5 10*3/uL — ABNORMAL LOW (ref 0.7–4.0)
MCH: 27.7 pg (ref 26.0–34.0)
MCHC: 33.7 g/dL (ref 30.0–36.0)
MCV: 82.3 fL (ref 80.0–100.0)
Monocytes Absolute: 0.4 10*3/uL (ref 0.1–1.0)
Monocytes Relative: 14 %
Neutro Abs: 1.9 10*3/uL (ref 1.7–7.7)
Neutrophils Relative %: 65 %
Platelet Count: 364 10*3/uL (ref 150–400)
RBC: 4.62 MIL/uL (ref 3.87–5.11)
RDW: 20.1 % — ABNORMAL HIGH (ref 11.5–15.5)
WBC Count: 2.9 10*3/uL — ABNORMAL LOW (ref 4.0–10.5)
nRBC: 0 % (ref 0.0–0.2)

## 2022-09-25 LAB — CMP (CANCER CENTER ONLY)
ALT: 57 U/L — ABNORMAL HIGH (ref 0–44)
AST: 50 U/L — ABNORMAL HIGH (ref 15–41)
Albumin: 3.6 g/dL (ref 3.5–5.0)
Alkaline Phosphatase: 61 U/L (ref 38–126)
Anion gap: 17 — ABNORMAL HIGH (ref 5–15)
BUN: 13 mg/dL (ref 8–23)
CO2: 20 mmol/L — ABNORMAL LOW (ref 22–32)
Calcium: 8.7 mg/dL — ABNORMAL LOW (ref 8.9–10.3)
Chloride: 100 mmol/L (ref 98–111)
Creatinine: 0.8 mg/dL (ref 0.44–1.00)
GFR, Estimated: >60 mL/min (ref >60)
Glucose, Bld: 118 mg/dL — ABNORMAL HIGH (ref 70–99)
Potassium: 3.1 mmol/L — ABNORMAL LOW (ref 3.5–5.1)
Sodium: 137 mmol/L (ref 135–145)
Total Bilirubin: 0.5 mg/dL (ref 0.3–1.2)
Total Protein: 6.5 g/dL (ref 6.5–8.1)

## 2022-09-25 LAB — RAD ONC ARIA SESSION SUMMARY
Course Elapsed Days: 49
Plan Fractions Treated to Date: 3
Plan Prescribed Dose Per Fraction: 2 Gy
Plan Total Fractions Prescribed: 3
Plan Total Prescribed Dose: 6 Gy
Reference Point Dosage Given to Date: 6 Gy
Reference Point Session Dosage Given: 2 Gy
Session Number: 33

## 2022-09-25 MED ORDER — POTASSIUM CHLORIDE IN NACL 20-0.9 MEQ/L-% IV SOLN
INTRAVENOUS | Status: DC
  Filled 2022-09-25: qty 1000

## 2022-09-25 MED ORDER — ONDANSETRON HCL 4 MG/2ML IJ SOLN
8.0000 mg | Freq: Once | INTRAMUSCULAR | Status: AC
  Administered 2022-09-25: 8 mg via INTRAVENOUS
  Filled 2022-09-25: qty 4

## 2022-09-25 MED ORDER — ONDANSETRON HCL 4 MG/2ML IJ SOLN: 8.0000 mg | Freq: Once | INTRAMUSCULAR | Status: DC

## 2022-09-25 MED ORDER — SODIUM CHLORIDE 0.9 % IV SOLN: INTRAVENOUS | Status: DC

## 2022-09-25 MED ORDER — SODIUM CHLORIDE 0.9 % IV SOLN: Freq: Once | INTRAVENOUS | Status: DC

## 2022-09-25 MED ORDER — POTASSIUM CHLORIDE IN NACL 20-0.9 MEQ/L-% IV SOLN
INTRAVENOUS | Status: DC
  Filled 2022-09-25 (×2): qty 1000

## 2022-09-25 MED ORDER — PROMETHAZINE HCL 25 MG RE SUPP: 25.0000 mg | Freq: Three times a day (TID) | RECTAL | 0 refills | Status: DC | PRN

## 2022-09-25 NOTE — Progress Notes (Signed)
[facility] [phone]) [phone]   Fax:(336) [phone]  OFFICE PROGRESS NOTE  [name], PA-C [address] [place] [address]  DIAGNOSIS:  stage IIIA (T2a, N2, M0) non-small cell lung cancer, adenocarcinoma that was initially diagnosed as a stage Ib (T2a, N0, M0) non-small cell lung cancer, adenocarcinoma status post right middle lobectomy on March 29, 2020 and the patient had evidence for disease recurrence in the mediastinal lymph nodes in May 2024.   MOLECULAR STUDY by [facility] one:  KRAS G12C mutation [id number] ATRX [id number] R56L  PDL1 Expression 100%   PRIOR THERAPY: Concurrent chemoradiation with weekly carboplatin for AUC of 2 and paclitaxel 45 Mg/M2.  First dose August 07, 2022.  Status post 7 cycles.  Last dose of chemotherapy was given on September 18, 2022 and last fraction of radiation on 09/25/2022.  CURRENT THERAPY: Observation.  INTERVAL HISTORY: [name] 72 y.o. female returns to the clinic today for follow-up visit accompanied by her daughter.  The patient is feeling fine today with no concerning complaints except for the persistent fatigue and lack of appetite.  She also has some odynophagia and currently on treatment with Carafate.  She takes Zofran and Phenergan suppository on as-needed basis for intermittent nausea.  She has no chest pain, shortness of breath, cough or hemoptysis.  She has no fever or chills.  She lost few pounds since the last visit.  She is here today for evaluation before the last fraction of radiotherapy and last dose of chemotherapy.  MEDICAL HISTORY: Past Medical History:  Diagnosis Date   Abnormal chest x-ray 01/19/2020   Acute laryngopharyngitis 03/18/2020   Aortic atherosclerosis (HCC) 01/28/2020   Arthritis    Cardiac murmur 03/01/2020   COPD (chronic obstructive pulmonary disease) (HCC)    Coronary artery calcification seen on CT scan 03/01/2020   Cough 01/12/2020   COVID-19 03/10/2020    Dyspnea    on exertion,after getting covid   Family history of adverse reaction to anesthesia    brothr had n/v   GERD (gastroesophageal reflux disease)    Hiatal hernia    Hyperlipemia    Lung cancer, middle lobe (HCC)    middle lobe removed   Pneumonia    PONV (postoperative nausea and vomiting)    Pre-operative cardiovascular examination 03/01/2020   Right lower lobe lung mass 01/28/2020   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  MEDICATIONS:  Current Outpatient Medications  Medication Sig Dispense Refill   alum & mag hydroxide-simeth (MAALOX MAX) 400-400-40 MG/5ML suspension Take 15 mLs by mouth every 6 (six) hours as needed for indigestion. 355 mL 0   benzonatate (TESSALON) 200 MG capsule Take 1 capsule (200 mg total) by mouth 3 (three) times daily as needed for cough. 30 capsule 0   cetirizine (ZYRTEC) 10 MG tablet Take 10 mg by mouth daily.     Coenzyme Q10 (CO Q-10 PO) Take 1 tablet by mouth daily.     cyclobenzaprine (FLEXERIL) 5 MG tablet Take 1 tablet (5 mg total) by mouth 3 (three) times daily as needed for muscle spasms. 30 tablet 1   dexamethasone (DECADRON) 4 MG tablet Take 2 tablets daily for 2 days, start the day after chemotherapy. Take with food. 30 tablet 1   Fluticasone-Umeclidin-Vilant (TRELEGY ELLIPTA) 100-62.5-25 MCG/ACT AEPB Inhale 1 puff into the lungs daily. 2 each  0   Homeopathic Products (LEG CRAMPS) SUBL Place 2 tablets under the tongue daily as needed (leg cramps).     HYDROcodone-acetaminophen (HYCET) 7.5-325 mg/15 ml solution Take 5-10 mLs by mouth every 4 (four) hours as needed for moderate pain. 473 mL 0   lidocaine (XYLOCAINE) 2 % solution Use as directed 15 mLs in the mouth or throat every 6 (six) hours as needed for mouth pain. 100 mL 0   lidocaine-prilocaine (EMLA) cream Apply to affected area once 30 g 3   montelukast (SINGULAIR)  10 MG tablet TAKE 1 TABLET AT BEDTIME 90 tablet 3   Multiple Vitamin (MULTIVITAMIN ADULT PO) Take 2 tablets by mouth daily.     mupirocin ointment (BACTROBAN) 2 % Apply 1 application topically 2 (two) times daily. (Patient taking differently: Apply 1 application  topically 2 (two) times daily as needed (wound care).) 22 g 2   omeprazole (PRILOSEC) 20 MG capsule TAKE 1 CAPSULE TWICE DAILY BEFORE MEALS 180 capsule 3   ondansetron (ZOFRAN-ODT) 8 MG disintegrating tablet Take 1 tablet (8 mg total) by mouth every 8 (eight) hours as needed for nausea or vomiting. 30 tablet 2   polyethylene glycol (MIRALAX / GLYCOLAX) 17 g packet Take 17 g by mouth daily as needed for moderate constipation.     potassium chloride SA (KLOR-CON M) 20 MEQ tablet Take 1 tablet (20 mEq total) by mouth daily. 8 tablet 0   prochlorperazine (COMPAZINE) 10 MG tablet Take 1 tablet (10 mg total) by mouth every 6 (six) hours as needed for nausea or vomiting. 30 tablet 1   rosuvastatin (CRESTOR) 5 MG tablet TAKE 1 TABLET EVERY DAY (Patient taking differently: Take 5 mg by mouth at bedtime.) 90 tablet 3   sucralfate (CARAFATE) 1 g tablet Take 1 tablet (1 g total) by mouth 4 (four) times daily. Dissolve each tablet in 15 cc water before use. 120 tablet 2   sulfamethoxazole-trimethoprim (BACTRIM DS) 800-160 MG tablet Take 1 tablet by mouth 2 (two) times daily. 6 tablet 0   VITAMIN D PO Take 2 tablets by mouth daily.     No current facility-administered medications for this visit.    SURGICAL HISTORY:  Past Surgical History:  Procedure Laterality Date   CATARACT EXTRACTION Bilateral 2023   LUNG REMOVAL, PARTIAL  03/29/2020   middle lobe removed   VIDEO BRONCHOSCOPY WITH ENDOBRONCHIAL ULTRASOUND N/A 07/20/2022   Procedure: VIDEO BRONCHOSCOPY WITH ENDOBRONCHIAL ULTRASOUND;  Surgeon: [name], MD;  Location: [facility] OR;  Service: Thoracic;  Laterality: N/A;    REVIEW OF SYSTEMS:  Constitutional: positive for anorexia, fatigue,  and weight loss Eyes: negative Ears, nose, mouth, throat, and face: negative Respiratory: positive for cough Cardiovascular: negative Gastrointestinal: positive for nausea and odynophagia Genitourinary:negative Integument/breast: negative Hematologic/lymphatic: negative Musculoskeletal:negative Neurological: negative Behavioral/Psych: negative Endocrine: negative Allergic/Immunologic: negative   PHYSICAL EXAMINATION: General appearance: alert, cooperative, fatigued, and no distress Head: Normocephalic, without obvious abnormality, atraumatic Neck: no adenopathy, no JVD, supple, symmetrical, trachea midline, and thyroid not enlarged, symmetric, no tenderness/mass/nodules Lymph nodes: Cervical, supraclavicular, and axillary nodes normal. Resp: clear to auscultation bilaterally Back: symmetric, no curvature. ROM normal. No CVA tenderness. Cardio: regular rate and rhythm, S1, S2 normal, no murmur, click, rub or gallop GI: soft, non-tender; bowel sounds normal; no masses,  no organomegaly Extremities: extremities normal, atraumatic, no cyanosis or edema Neurologic: Alert and oriented X 3, normal strength and tone. Normal symmetric reflexes. Normal coordination and gait  ECOG PERFORMANCE STATUS: 1 - Symptomatic but completely  ambulatory  Blood pressure 94/66, pulse (!) 130, temperature (!) 97.4 F (36.3 C), temperature source Axillary, resp. rate 16, height 5\' 7"  (1.702 m), weight 142 lb 1.6 oz (64.5 kg), SpO2 98%.  LABORATORY DATA: Lab Results  Component Value Date   WBC 2.9 (L) 09/25/2022   HGB 12.8 09/25/2022   HCT 38.0 09/25/2022   MCV 82.3 09/25/2022   PLT 364 09/25/2022      Chemistry      Component Value Date/Time   NA 137 09/25/2022 0801   NA 139 08/22/2022 1146   K 3.1 (L) 09/25/2022 0801   CL 100 09/25/2022 0801   CO2 20 (L) 09/25/2022 0801   BUN 13 09/25/2022 0801   BUN 10 08/22/2022 1146   CREATININE 0.80 09/25/2022 0801      Component Value Date/Time    CALCIUM 8.7 (L) 09/25/2022 0801   ALKPHOS 61 09/25/2022 0801   AST 50 (H) 09/25/2022 0801   ALT 57 (H) 09/25/2022 0801   BILITOT 0.5 09/25/2022 0801       RADIOGRAPHIC STUDIES: CT ABDOMEN PELVIS W CONTRAST  Result Date: 09/10/2022 CLINICAL DATA:  Right upper quadrant pain for 2-3 days, breast cancer, ongoing chemotherapy * Tracking Code: BO * EXAM: CT ABDOMEN AND PELVIS WITH CONTRAST TECHNIQUE: Multidetector CT imaging of the abdomen and pelvis was performed using the standard protocol following bolus administration of intravenous contrast. RADIATION DOSE REDUCTION: This exam was performed according to the departmental dose-optimization program which includes automated exposure control, adjustment of the mA and/or kV according to patient size and/or use of iterative reconstruction technique. CONTRAST:  OMNIPAQUE IOHEXOL 300 MG/ML  SOLN COMPARISON:  PET-CT, 07/03/2022 FINDINGS: Lower chest: No acute abnormality. Hepatobiliary: No solid liver abnormality is seen. Hepatic steatosis. No gallstones, gallbladder wall thickening, or biliary dilatation. Pancreas: Unremarkable. No pancreatic ductal dilatation or surrounding inflammatory changes. Spleen: Normal in size without significant abnormality. Adrenals/Urinary Tract: Adrenal glands are unremarkable. Kidneys are normal, without renal calculi, solid lesion, or hydronephrosis. Bladder is unremarkable. Stomach/Bowel: Stomach is within normal limits. Appendix appears normal. No evidence of bowel wall thickening, distention, or inflammatory changes. Vascular/Lymphatic: Aortic atherosclerosis. No enlarged abdominal or pelvic lymph nodes. Reproductive: No mass or other significant abnormality. Pessary in the vagina. Other: No abdominal wall hernia or abnormality. No ascites. Musculoskeletal: No acute or significant osseous findings. IMPRESSION: 1. No acute CT findings of the abdomen or pelvis to explain right upper quadrant pain. 2. Hepatic steatosis. 3. No  evidence of lymphadenopathy or metastatic disease in the abdomen or pelvis. Aortic Atherosclerosis (ICD10-I70.0). Electronically Signed   By: Jearld Lesch M.D.   On: 09/10/2022 17:38   DG Chest Port 1 View  Result Date: 09/10/2022 CLINICAL DATA:  Left upper quadrant pain and chest pain. History of lung cancer with radiation therapy. EXAM: PORTABLE CHEST 1 VIEW COMPARISON:  07/18/22 FINDINGS: Heart size is normal. Postoperative changes from right middle lobectomy. No signs of pleural effusion, interstitial edema, or airspace disease. 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 Visualized osseous structures are unremarkable. IMPRESSION: 1. No acute cardiopulmonary disease. 2. Status post right middle lobectomy. 3. COPD/emphysema. Electronically Signed   By: Signa Kell M.D.   On: 09/10/2022 13:54    ASSESSMENT AND PLAN: This is a very pleasant 72 years old white female with  stage IIIA (T2a, N2, M0) non-small cell lung cancer, adenocarcinoma that was initially diagnosed as a stage Ib (T2a, N0, M0) non-small cell lung cancer, adenocarcinoma status post right middle lobectomy on March 29, 2020 and the patient had evidence for disease recurrence in the mediastinal lymph nodes in May 2024.  Molecular studies by foundation 1 showed positive KRAS G12C mutation and PD-L1 expression of 100%. The patient is currently undergoing a course of concurrent chemoradiation with weekly carboplatin for AUC of 2 and paclitaxel 45 Mg/M2 status post 7 cycles.  She is a scheduled for the last fraction of radiotherapy today.  I will cancel the last dose of chemotherapy that was scheduled today. For the dehydration, nausea and hypokalemia, I will give the patient IV hydration with normal saline and potassium supplement as well as IV Zofran. I will see her back for follow-up visit in around 4 weeks with repeat CT scan of the chest for restaging of her disease. For the nausea she will continue on Zofran  ODT and occasional Phenergan suppository if needed. The patient was advised to call immediately if she has any other concerning symptoms in the interval. The patient voices understanding of current disease status and treatment options and is in agreement with the current care plan.  All questions were answered. The patient knows to call the clinic with any problems, questions or concerns. We can certainly see the patient much sooner if necessary.  The total time spent in the appointment was 30 minutes.  Disclaimer: This note was dictated with voice recognition software. Similar sounding words can inadvertently be transcribed and may not be corrected upon review.

## 2022-09-25 NOTE — Telephone Encounter (Signed)
Scheduled per 08/05 scheduling message, patient has been called and voicemail was left.

## 2022-09-26 NOTE — Radiation Completion Notes (Signed)
  Radiation Oncology         [phone] ________________________________  Name: [name] MRN: [id number]  Date of Service: 09/25/2022  DOB: Apr 12, 1950  End of Treatment Note     Diagnosis: Stage IIIA, cT2aN2M0, NSCLC of the right hilar lung  Intent: Curative     ==========DELIVERED PLANS==========  First Treatment Date: 2022-08-07 - Last Treatment Date: 2022-09-25   Plan Name: Lung_R Site: Lung, Right Technique: 3D Mode: Photon Dose Per Fraction: 2 Gy Prescribed Dose (Delivered / Prescribed): 60 Gy / 60 Gy Prescribed Fxs (Delivered / Prescribed): 30 / 30   Plan Name: Lung_R_Bst Site: Lung, Right Technique: 3D Mode: Photon Dose Per Fraction: 2 Gy Prescribed Dose (Delivered / Prescribed): 6 Gy / 6 Gy Prescribed Fxs (Delivered / Prescribed): 3 / 3     ==========ON TREATMENT VISIT DATES========== 2022-08-11, 2022-08-18, 2022-08-25, 2022-09-01, 2022-09-12, 2022-09-15, 2022-09-22     See weekly On Treatment Notes in Epic for details. The patient tolerated radiation. She developed fatigue and esophagitis during therapy requiring medication and IV fluids to treat dehydration.  The patient will receive a call in about one month from the radiation oncology department. She will continue follow up with Dr. Arbutus Ped as well.      Osker Mason, PAC

## 2022-09-27 ENCOUNTER — Other Ambulatory Visit: Payer: Self-pay | Admitting: Physician Assistant

## 2022-09-27 ENCOUNTER — Inpatient Hospital Stay: Payer: Medicare HMO

## 2022-09-27 DIAGNOSIS — E876 Hypokalemia: Secondary | ICD-10-CM | POA: Diagnosis not present

## 2022-09-27 DIAGNOSIS — C342 Malignant neoplasm of middle lobe, bronchus or lung: Secondary | ICD-10-CM | POA: Diagnosis not present

## 2022-09-27 DIAGNOSIS — C3491 Malignant neoplasm of unspecified part of right bronchus or lung: Secondary | ICD-10-CM

## 2022-09-27 DIAGNOSIS — Z510 Encounter for antineoplastic radiation therapy: Secondary | ICD-10-CM | POA: Diagnosis not present

## 2022-09-27 DIAGNOSIS — R110 Nausea: Secondary | ICD-10-CM

## 2022-09-27 DIAGNOSIS — Z79899 Other long term (current) drug therapy: Secondary | ICD-10-CM | POA: Diagnosis not present

## 2022-09-27 DIAGNOSIS — E860 Dehydration: Secondary | ICD-10-CM

## 2022-09-27 DIAGNOSIS — E872 Acidosis, unspecified: Secondary | ICD-10-CM | POA: Diagnosis not present

## 2022-09-27 MED ORDER — ONDANSETRON HCL 4 MG/2ML IJ SOLN
8.0000 mg | Freq: Once | INTRAMUSCULAR | Status: AC
  Administered 2022-09-27: 8 mg via INTRAVENOUS
  Filled 2022-09-27: qty 4

## 2022-09-27 MED ORDER — SODIUM CHLORIDE 0.9 % IV SOLN: INTRAVENOUS | Status: DC

## 2022-09-27 NOTE — Patient Instructions (Signed)

## 2022-09-27 NOTE — Progress Notes (Signed)
Zofran IV administered over 6 minutes today, after administration and IVFs restarted at 999 mL/hr Pt asked this RN "why is my arm red and cold?" This RN paused infusion and assessed PIV site. PIV site was noted to be free of swelling and pain, but did have a red line streaking up the Pt's vein similar to that of a flare reaction. PIV site was cold to touch and not painful. Another RN assessed site as well. Capillary refill assessed and noted to be < 3 seconds. PIV was flushed and found to be patent, blood return was noted. This RN reached out to [facility]. Heat applied to site. IVFs restarted and completed without further incident. At the completion of the infusion, Pt's PIV site was found to be clear of all redness, no swelling or discoloration present.  This RN made Cassie PA-C aware. Anti-emetic orders updated for next infusion on Friday (09/29/2022).

## 2022-09-29 ENCOUNTER — Inpatient Hospital Stay: Payer: Medicare HMO

## 2022-09-29 DIAGNOSIS — Z510 Encounter for antineoplastic radiation therapy: Secondary | ICD-10-CM | POA: Diagnosis not present

## 2022-09-29 DIAGNOSIS — C342 Malignant neoplasm of middle lobe, bronchus or lung: Secondary | ICD-10-CM | POA: Diagnosis not present

## 2022-09-29 DIAGNOSIS — E872 Acidosis, unspecified: Secondary | ICD-10-CM | POA: Diagnosis not present

## 2022-09-29 DIAGNOSIS — E876 Hypokalemia: Secondary | ICD-10-CM | POA: Diagnosis not present

## 2022-09-29 DIAGNOSIS — E860 Dehydration: Secondary | ICD-10-CM

## 2022-09-29 DIAGNOSIS — Z79899 Other long term (current) drug therapy: Secondary | ICD-10-CM | POA: Diagnosis not present

## 2022-09-29 MED ORDER — ONDANSETRON 8 MG/50ML IVPB (CHCC): 8.0000 mg | Freq: Once | INTRAVENOUS | Status: DC

## 2022-09-29 MED ORDER — SODIUM CHLORIDE 0.9 % IV SOLN: Freq: Once | INTRAVENOUS | Status: AC

## 2022-09-29 MED ORDER — ONDANSETRON 8 MG/NS 50 ML IVPB
8.0000 mg | Freq: Once | INTRAVENOUS | Status: DC
  Filled 2022-09-29: qty 54

## 2022-09-29 MED ORDER — SODIUM CHLORIDE 0.9 % IV SOLN
8.0000 mg | Freq: Once | INTRAVENOUS | Status: AC
  Administered 2022-09-29: 8 mg via INTRAVENOUS
  Filled 2022-09-29: qty 4

## 2022-09-29 MED ORDER — SODIUM CHLORIDE 0.9 % IV SOLN: 16.0000 mg | Freq: Once | INTRAVENOUS | Status: DC

## 2022-09-29 MED ORDER — ONDANSETRON HCL 4 MG/2ML IJ SOLN: 8.0000 mg | Freq: Once | INTRAMUSCULAR | Status: DC | PRN

## 2022-09-29 NOTE — Patient Instructions (Signed)

## 2022-09-29 NOTE — Progress Notes (Signed)
Zofran 8 mg mixed in NS was infused via short primary set today concurrently with IVFs. Pt tolerated Zofran well, no flare rxn, or skin irritation noted during today's infusion.

## 2022-10-01 ENCOUNTER — Encounter: Payer: Self-pay | Admitting: Internal Medicine

## 2022-10-02 ENCOUNTER — Inpatient Hospital Stay: Payer: Medicare HMO

## 2022-10-02 ENCOUNTER — Other Ambulatory Visit: Payer: Self-pay

## 2022-10-02 ENCOUNTER — Telehealth: Payer: Self-pay | Admitting: Medical Oncology

## 2022-10-02 ENCOUNTER — Telehealth: Payer: Self-pay

## 2022-10-02 DIAGNOSIS — R112 Nausea with vomiting, unspecified: Secondary | ICD-10-CM

## 2022-10-02 NOTE — Progress Notes (Unsigned)
Symptom Management Consult Note [facility]    Patient Care Team: [name], Cordelia Poche as PCP - General (Physician Assistant)    Name / MRN / DOB: [name]  [id number]  08/15/1950   Date of visit: 10/03/2022   Chief Complaint/Reason for visit: poor PO intake   Current Therapy: carboplatin and paclitaxel   Last treatment:  Day 1   Cycle 7 on 09/18/22   ASSESSMENT & PLAN: Patient is a 72 y.o. female with oncologic history of stage IIIA (T2a, N2, M0) non-small cell lung cancer, adenocarcinoma followed by Dr. Arbutus Ped.  I have viewed most recent oncology note and lab work.    #Stage IIIA (T2a, N2, M0) non-small cell lung cancer, adenocarcinoma - Final radiation 09/25/22  - Next appointment with oncologist is 10/24/22   #Sore throat -Suspect this is related from recent radiation therapy -Patient has viscous lidocaine and PO Carafate she has been dissolving although is still unable to tolerate it. Prescription sent to pharmacy for Carafate suspension to try for symptom management. -Patient received 1L NS and IV pepcid in clinic today for hydration support. -She has Hycet at home she can continue for pain control as well. - Discussed at length importance of staying hydrated and advancing diet as she can tolerate it.  #Nausea -Patient received 8 mg IV Zofran in clinic.  On reassessment nausea has resolved and she is eating ice chips. -Abdominal exam is benign. -She has Phenergan suppositories at home for as needed use  #Hypokalemia -Potassium today is 2.8. Suspect this is related to GI loss and poor PO intake. Patient given 4 run of IV potassium in clinic.  -Patient will not be able to tolerate PO potassium at home therefore will have her return to clinic in 2 days to recheck labs and symptom management.  Patient will return for IVF two days this week and then 3 days next week with lab work to determine if additional electrolyte replacement is needed.  Strict  ED precautions discussed should symptoms worsen.   Heme/Onc History: Oncology History  Adenocarcinoma of right lung, stage 3 (HCC)  03/31/2020 Initial Diagnosis   Primary lung cancer, right middle lobe - resected   03/31/2020 Cancer Staging   Staging form: Lung, AJCC 8th Edition - Pathologic stage from 03/31/2020: Stage IIIA (pT2a, pN2, cM0) - Signed by Si Gaul, MD on 08/01/2022 Histopathologic type: Adenocarcinoma, NOS Stage prefix: Initial diagnosis   08/07/2022 - 09/18/2022 Chemotherapy   Patient is on Treatment Plan : LUNG Carboplatin + Paclitaxel + XRT q7d         Interval history-: [name] is a 72 y.o. female with oncologic history as above presenting to [facility] today with chief complaint of sore throat and nausea.  She is accompanied by a friend who provides additional history.  Patient states she has had a sore throat ever since she finished radiation on 09/25/2022.  Because of this she is having a hard time tolerating p.o. intake especially her medications.  She has been unable to take her potassium pills that were previously prescribed.  Patient states she maybe drank 8 ounces of fluid yesterday.  She has had little to no food intake, was able to eat a frozen banana popsicle yesterday.  She has been also struggling with nausea with emesis for the last x 2 weeks.  She vomits on average 3 times per day.  She tried a Phenergan suppository yesterday which provided symptom relief.  Patient denies any fevers, chills  or abdominal pain.  She does admit to feeling dizzy when standing up, has not had any falls.  Patient took liquid Hycet for pain yesterday which gave her some relief.  She has been trying to dissolve the Carafate pills into a glass of cold water however it makes the water feel gritty and she is unable to swallow it.  She also has a prescription for viscous lidocaine at home that she is unable to tolerate because of how it tastes.      ROS  All other systems are reviewed  and are negative for acute change except as noted in the HPI.    Allergies  Allergen Reactions   Codeine Hives   Surgical Lubricant Hives     Past Medical History:  Diagnosis Date   Abnormal chest x-ray 01/19/2020   Acute laryngopharyngitis 03/18/2020   Aortic atherosclerosis (HCC) 01/28/2020   Arthritis    Cardiac murmur 03/01/2020   COPD (chronic obstructive pulmonary disease) (HCC)    Coronary artery calcification seen on CT scan 03/01/2020   Cough 01/12/2020   COVID-19 03/10/2020   Dyspnea    on exertion,after getting covid   Family history of adverse reaction to anesthesia    brothr had n/v   GERD (gastroesophageal reflux disease)    Hiatal hernia    Hyperlipemia    Lung cancer, middle lobe (HCC)    middle lobe removed   Pneumonia    PONV (postoperative nausea and vomiting)    Pre-operative cardiovascular examination 03/01/2020   Right lower lobe lung mass 01/28/2020   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   Visit for screening mammogram 01/28/2020     Past Surgical History:  Procedure Laterality Date   CATARACT EXTRACTION Bilateral 2023   LUNG REMOVAL, PARTIAL  03/29/2020   middle lobe removed   VIDEO BRONCHOSCOPY WITH ENDOBRONCHIAL ULTRASOUND N/A 07/20/2022   Procedure: VIDEO BRONCHOSCOPY WITH ENDOBRONCHIAL ULTRASOUND;  Surgeon: [name], MD;  Location: [facility] OR;  Service: Thoracic;  Laterality: N/A;    Social History   Socioeconomic History   Marital status: Widowed    Spouse name: Not on file   Number of children: 1   Years of education: Not on file   Highest education level: GED or equivalent  Occupational History   Not on file  Tobacco Use   Smoking status: Former    Current packs/day: 0.00    Types: Cigarettes    Start date: 03/23/1967    Quit date: 03/22/2017    Years since quitting: 5.5   Smokeless tobacco: Never  Vaping Use   Vaping status: Never Used  Substance and Sexual Activity   Alcohol use: Never   Drug  use: Never   Sexual activity: Not Currently  Other Topics Concern   Not on file  Social History Narrative   Not on file   Social Determinants of Health   Financial Resource Strain: Medium Risk (08/22/2022)   Overall Financial Resource Strain (CARDIA)    Difficulty of Paying Living Expenses: Somewhat hard  Food Insecurity: No Food Insecurity (08/22/2022)   Hunger Vital Sign    Worried About Running Out of Food in the Last Year: Never true    Ran Out of Food in the Last Year: Never true  Transportation Needs: No Transportation Needs (08/22/2022)   PRAPARE - Administrator, Civil Service (Medical): No    Lack of Transportation (Non-Medical): No  Physical Activity: Insufficiently Active (08/22/2022)   Exercise Vital Sign  Days of Exercise per Week: 4 days    Minutes of Exercise per Session: 10 min  Stress: No Stress Concern Present (08/22/2022)   [employer] of Occupational Health - Occupational Stress Questionnaire    Feeling of Stress : Not at all  Social Connections: Moderately Isolated (08/22/2022)   Social Connection and Isolation Panel [NHANES]    Frequency of Communication with Friends and Family: More than three times a week    Frequency of Social Gatherings with Friends and Family: More than three times a week    Attends Religious Services: 1 to 4 times per year    Active Member of [employer] or Organizations: No    Attends Banker Meetings: Never    Marital Status: Widowed  Intimate Partner Violence: Not At Risk (08/22/2022)   Humiliation, Afraid, Rape, and Kick questionnaire    Fear of Current or Ex-Partner: No    Emotionally Abused: No    Physically Abused: No    Sexually Abused: No    Family History  Problem Relation Age of Onset   Heart disease Mother    Heart attack Mother    Cancer Father    Diabetes Father    Non-Hodgkin's lymphoma Brother    High Cholesterol Brother    High Cholesterol Brother    Heart disease Brother    Heart attack  Brother      Current Outpatient Medications:    sucralfate (CARAFATE) 1 GM/10ML suspension, Take 10 mLs (1 g total) by mouth 4 (four) times daily -  with meals and at bedtime., Disp: 420 mL, Rfl: 0   alum & mag hydroxide-simeth (MAALOX MAX) 400-400-40 MG/5ML suspension, Take 15 mLs by mouth every 6 (six) hours as needed for indigestion., Disp: 355 mL, Rfl: 0   benzonatate (TESSALON) 200 MG capsule, Take 1 capsule (200 mg total) by mouth 3 (three) times daily as needed for cough., Disp: 30 capsule, Rfl: 0   cetirizine (ZYRTEC) 10 MG tablet, Take 10 mg by mouth daily., Disp: , Rfl:    Coenzyme Q10 (CO Q-10 PO), Take 1 tablet by mouth daily., Disp: , Rfl:    cyclobenzaprine (FLEXERIL) 5 MG tablet, Take 1 tablet (5 mg total) by mouth 3 (three) times daily as needed for muscle spasms., Disp: 30 tablet, Rfl: 1   Fluticasone-Umeclidin-Vilant (TRELEGY ELLIPTA) 100-62.5-25 MCG/ACT AEPB, Inhale 1 puff into the lungs daily., Disp: 2 each, Rfl: 0   Homeopathic Products (LEG CRAMPS) SUBL, Place 2 tablets under the tongue daily as needed (leg cramps)., Disp: , Rfl:    HYDROcodone-acetaminophen (HYCET) 7.5-325 mg/15 ml solution, Take 5-10 mLs by mouth every 4 (four) hours as needed for moderate pain., Disp: 473 mL, Rfl: 0   lidocaine (XYLOCAINE) 2 % solution, Use as directed 15 mLs in the mouth or throat every 6 (six) hours as needed for mouth pain., Disp: 100 mL, Rfl: 0   montelukast (SINGULAIR) 10 MG tablet, TAKE 1 TABLET AT BEDTIME, Disp: 90 tablet, Rfl: 3   Multiple Vitamin (MULTIVITAMIN ADULT PO), Take 2 tablets by mouth daily., Disp: , Rfl:    mupirocin ointment (BACTROBAN) 2 %, Apply 1 application topically 2 (two) times daily. (Patient taking differently: Apply 1 application  topically 2 (two) times daily as needed (wound care).), Disp: 22 g, Rfl: 2   omeprazole (PRILOSEC) 20 MG capsule, TAKE 1 CAPSULE TWICE DAILY BEFORE MEALS, Disp: 180 capsule, Rfl: 3   ondansetron (ZOFRAN-ODT) 8 MG disintegrating  tablet, Take 1 tablet (8 mg total) by  mouth every 8 (eight) hours as needed for nausea or vomiting., Disp: 30 tablet, Rfl: 2   polyethylene glycol (MIRALAX / GLYCOLAX) 17 g packet, Take 17 g by mouth daily as needed for moderate constipation., Disp: , Rfl:    potassium chloride SA (KLOR-CON M) 20 MEQ tablet, Take 1 tablet (20 mEq total) by mouth daily., Disp: 8 tablet, Rfl: 0   promethazine (PHENERGAN) 25 MG suppository, Place 1 suppository (25 mg total) rectally every 8 (eight) hours as needed for nausea or vomiting., Disp: 12 each, Rfl: 0   rosuvastatin (CRESTOR) 5 MG tablet, TAKE 1 TABLET EVERY DAY (Patient taking differently: Take 5 mg by mouth at bedtime.), Disp: 90 tablet, Rfl: 3   sucralfate (CARAFATE) 1 g tablet, Take 1 tablet (1 g total) by mouth 4 (four) times daily. Dissolve each tablet in 15 cc water before use., Disp: 120 tablet, Rfl: 2   sulfamethoxazole-trimethoprim (BACTRIM DS) 800-160 MG tablet, Take 1 tablet by mouth 2 (two) times daily., Disp: 6 tablet, Rfl: 0   VITAMIN D PO, Take 2 tablets by mouth daily., Disp: , Rfl:   PHYSICAL EXAM: ECOG FS:1 - Symptomatic but completely ambulatory    Vitals:   10/03/22 0941  BP: 104/63  Pulse: (!) 107  Resp: 18  Temp: (!) 94.5 F (34.7 C)  TempSrc: Oral  SpO2: 99%  Weight: 137 lb 8 oz (62.4 kg)   Physical Exam Vitals and nursing note reviewed.  Constitutional:      Appearance: She is not ill-appearing or toxic-appearing.  HENT:     Head: Normocephalic.     Mouth/Throat:     Mouth: Mucous membranes are dry.     Comments: No oral lesions Eyes:     Conjunctiva/sclera: Conjunctivae normal.  Cardiovascular:     Rate and Rhythm: Regular rhythm. Tachycardia present.     Pulses: Normal pulses.     Heart sounds: Normal heart sounds.  Pulmonary:     Effort: Pulmonary effort is normal.     Breath sounds: Normal breath sounds.  Abdominal:     General: There is no distension.     Tenderness: There is no abdominal tenderness.   Musculoskeletal:     Cervical back: Normal range of motion.  Skin:    General: Skin is warm and dry.  Neurological:     Mental Status: She is alert.        LABORATORY DATA: I have reviewed the data as listed    Latest Ref Rng & Units 10/03/2022    9:03 AM 09/25/2022    8:01 AM 09/18/2022    1:29 PM  CBC  WBC 4.0 - 10.5 K/uL 4.7  2.9  2.4   Hemoglobin 12.0 - 15.0 g/dL 29.5  62.1  30.8   Hematocrit 36.0 - 46.0 % 40.3  38.0  37.5   Platelets 150 - 400 K/uL 291  364  242         Latest Ref Rng & Units 10/03/2022    9:03 AM 09/25/2022    8:01 AM 09/18/2022    1:29 PM  CMP  Glucose 70 - 99 mg/dL 657  846  962   BUN 8 - 23 mg/dL 11  13  10    Creatinine 0.44 - 1.00 mg/dL 9.52  8.41  3.24   Sodium 135 - 145 mmol/L 137  137  136   Potassium 3.5 - 5.1 mmol/L 2.8  3.1  3.1   Chloride 98 - 111 mmol/L 100  100  98   CO2 22 - 32 mmol/L 21  20  25    Calcium 8.9 - 10.3 mg/dL 9.4  8.7  9.1   Total Protein 6.5 - 8.1 g/dL 6.5  6.5  6.5   Total Bilirubin 0.3 - 1.2 mg/dL 0.5  0.5  0.5   Alkaline Phos 38 - 126 U/L 66  61  60   AST 15 - 41 U/L 27  50  37   ALT 0 - 44 U/L 28  57  45        RADIOGRAPHIC STUDIES (from last 24 hours if applicable) I have personally reviewed the radiological images as listed and agreed with the findings in the report. No results found.      Visit Diagnosis: 1. Adenocarcinoma of right lung, stage 3 (HCC)   2. Hypokalemia   3. Intractable nausea and vomiting      No orders of the defined types were placed in this encounter.   All questions were answered. The patient knows to call the clinic with any problems, questions or concerns. No barriers to learning was detected.  A total of more than 30 minutes were spent on this encounter with face-to-face time and non-face-to-face time, including preparing to see the patient, ordering tests and/or medications, counseling the patient and coordination of care as outlined above.    Thank you for allowing me to  participate in the care of this patient.    Shanon Ace, PA-C Department of Hematology/Oncology [facility] Phone: [phone]  [phone]    10/03/2022 2:53 PM

## 2022-10-02 NOTE — Telephone Encounter (Signed)
This nurse received a call from this patient stating that she is not eating and is unable to drink or take her medications.  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  She has also been vomiting and is very worried about her condition.   Patient would like to be seen tomorrow and receive fluids, if possible, patient states that she is unable to get here today.  This nurse advised that her request will be forwarded to the provider.  No further questions or concerns at this time.

## 2022-10-02 NOTE — Telephone Encounter (Signed)
Pt scheduled tomorrow for IVF. At her last infusion the nurse had to use the vein finder-because of her poor vein status.

## 2022-10-02 NOTE — Progress Notes (Signed)
Nutrition Follow-up:  Patient with non small cell lung cancer.  Patient receiving concurrent chemotherapy and radiation.    Spoke with patient for telephone visit.  Patient's intake is extremely low.  Says hernia at the base of her esophagus has caused her to not be able to eat.  Reports burning and vomiting.  Today able to eat a banana popscile.  Yesterday able to eat some pudding.  Able to drink water but nothing else due to burning and vomiting.  Having a hard time keeping medications down.      Medications: reviewed  Labs: reviewed  Anthropometrics:   Weight 142 lb 1.6 oz on 8/5 151 lb on 7/29 158 lb on 7/15 161 lb on 6/17 163 lb on 5/21   NUTRITION DIAGNOSIS: Inadequate oral intake continues    INTERVENTION:  Patient will be seen in [facility] tomorrow Encouraged small frequent  nibbles q 2 hours Discussed bland foods to choose    MONITORING, EVALUATION, GOAL: weight trends intake   NEXT VISIT: Monday, August 26 phone call   B. Freida Busman, RD, LDN Registered Dietitian [phone]

## 2022-10-02 NOTE — Progress Notes (Signed)
See progress note.

## 2022-10-02 NOTE — Telephone Encounter (Signed)
This nurse reached out to this patient and made her aware that symptom management clinic can see her tomorrow at 930.  Advised that she can come in at 9 to have her labs drawn first.  Patient is in agreement with appointment.  No further questions or concerns noted at this time.

## 2022-10-03 ENCOUNTER — Other Ambulatory Visit: Payer: Self-pay

## 2022-10-03 ENCOUNTER — Inpatient Hospital Stay (HOSPITAL_BASED_OUTPATIENT_CLINIC_OR_DEPARTMENT_OTHER): Payer: Medicare HMO | Admitting: Physician Assistant

## 2022-10-03 ENCOUNTER — Other Ambulatory Visit (HOSPITAL_COMMUNITY): Payer: Self-pay

## 2022-10-03 ENCOUNTER — Encounter: Payer: Self-pay | Admitting: Physician Assistant

## 2022-10-03 ENCOUNTER — Inpatient Hospital Stay: Payer: Medicare HMO

## 2022-10-03 ENCOUNTER — Telehealth: Payer: Self-pay

## 2022-10-03 VITALS — BP 104/63 | HR 107 | Temp 94.5°F | Resp 18 | Wt 137.5 lb

## 2022-10-03 VITALS — BP 119/60 | HR 88 | Temp 97.7°F | Resp 16

## 2022-10-03 DIAGNOSIS — K208 Other esophagitis without bleeding: Secondary | ICD-10-CM | POA: Diagnosis present

## 2022-10-03 DIAGNOSIS — C3491 Malignant neoplasm of unspecified part of right bronchus or lung: Secondary | ICD-10-CM | POA: Diagnosis not present

## 2022-10-03 DIAGNOSIS — R112 Nausea with vomiting, unspecified: Secondary | ICD-10-CM

## 2022-10-03 DIAGNOSIS — K2210 Ulcer of esophagus without bleeding: Secondary | ICD-10-CM | POA: Diagnosis not present

## 2022-10-03 DIAGNOSIS — Z885 Allergy status to narcotic agent status: Secondary | ICD-10-CM | POA: Diagnosis not present

## 2022-10-03 DIAGNOSIS — Z807 Family history of other malignant neoplasms of lymphoid, hematopoietic and related tissues: Secondary | ICD-10-CM | POA: Diagnosis not present

## 2022-10-03 DIAGNOSIS — C342 Malignant neoplasm of middle lobe, bronchus or lung: Secondary | ICD-10-CM | POA: Diagnosis present

## 2022-10-03 DIAGNOSIS — K209 Esophagitis, unspecified without bleeding: Secondary | ICD-10-CM | POA: Diagnosis not present

## 2022-10-03 DIAGNOSIS — K317 Polyp of stomach and duodenum: Secondary | ICD-10-CM | POA: Diagnosis present

## 2022-10-03 DIAGNOSIS — G893 Neoplasm related pain (acute) (chronic): Secondary | ICD-10-CM | POA: Diagnosis present

## 2022-10-03 DIAGNOSIS — R1013 Epigastric pain: Secondary | ICD-10-CM | POA: Diagnosis not present

## 2022-10-03 DIAGNOSIS — Z833 Family history of diabetes mellitus: Secondary | ICD-10-CM | POA: Diagnosis not present

## 2022-10-03 DIAGNOSIS — Z923 Personal history of irradiation: Secondary | ICD-10-CM | POA: Diagnosis not present

## 2022-10-03 DIAGNOSIS — K219 Gastro-esophageal reflux disease without esophagitis: Secondary | ICD-10-CM | POA: Diagnosis present

## 2022-10-03 DIAGNOSIS — Z902 Acquired absence of lung [part of]: Secondary | ICD-10-CM | POA: Diagnosis not present

## 2022-10-03 DIAGNOSIS — Z87891 Personal history of nicotine dependence: Secondary | ICD-10-CM | POA: Diagnosis not present

## 2022-10-03 DIAGNOSIS — E785 Hyperlipidemia, unspecified: Secondary | ICD-10-CM | POA: Diagnosis present

## 2022-10-03 DIAGNOSIS — K449 Diaphragmatic hernia without obstruction or gangrene: Secondary | ICD-10-CM | POA: Diagnosis present

## 2022-10-03 DIAGNOSIS — I2510 Atherosclerotic heart disease of native coronary artery without angina pectoris: Secondary | ICD-10-CM | POA: Diagnosis present

## 2022-10-03 DIAGNOSIS — Z8249 Family history of ischemic heart disease and other diseases of the circulatory system: Secondary | ICD-10-CM | POA: Diagnosis not present

## 2022-10-03 DIAGNOSIS — Y842 Radiological procedure and radiotherapy as the cause of abnormal reaction of the patient, or of later complication, without mention of misadventure at the time of the procedure: Secondary | ICD-10-CM | POA: Diagnosis present

## 2022-10-03 DIAGNOSIS — F419 Anxiety disorder, unspecified: Secondary | ICD-10-CM | POA: Diagnosis present

## 2022-10-03 DIAGNOSIS — E876 Hypokalemia: Secondary | ICD-10-CM

## 2022-10-03 DIAGNOSIS — Z91048 Other nonmedicinal substance allergy status: Secondary | ICD-10-CM | POA: Diagnosis not present

## 2022-10-03 DIAGNOSIS — T451X5A Adverse effect of antineoplastic and immunosuppressive drugs, initial encounter: Secondary | ICD-10-CM | POA: Diagnosis present

## 2022-10-03 DIAGNOSIS — Z8616 Personal history of COVID-19: Secondary | ICD-10-CM | POA: Diagnosis not present

## 2022-10-03 DIAGNOSIS — Z9989 Dependence on other enabling machines and devices: Secondary | ICD-10-CM | POA: Diagnosis not present

## 2022-10-03 DIAGNOSIS — E860 Dehydration: Secondary | ICD-10-CM

## 2022-10-03 DIAGNOSIS — Z452 Encounter for adjustment and management of vascular access device: Secondary | ICD-10-CM | POA: Diagnosis not present

## 2022-10-03 DIAGNOSIS — I700 Atherosclerosis of aorta: Secondary | ICD-10-CM | POA: Diagnosis present

## 2022-10-03 DIAGNOSIS — J449 Chronic obstructive pulmonary disease, unspecified: Secondary | ICD-10-CM | POA: Diagnosis present

## 2022-10-03 LAB — CBC WITH DIFFERENTIAL (CANCER CENTER ONLY)
Abs Immature Granulocytes: 0.05 10*3/uL (ref 0.00–0.07)
Basophils Absolute: 0 10*3/uL (ref 0.0–0.1)
Basophils Relative: 1 %
Eosinophils Absolute: 0 10*3/uL (ref 0.0–0.5)
Eosinophils Relative: 0 %
HCT: 40.3 % (ref 36.0–46.0)
Hemoglobin: 13.5 g/dL (ref 12.0–15.0)
Immature Granulocytes: 1 %
Lymphocytes Relative: 17 %
Lymphs Abs: 0.8 10*3/uL (ref 0.7–4.0)
MCH: 27.7 pg (ref 26.0–34.0)
MCHC: 33.5 g/dL (ref 30.0–36.0)
MCV: 82.8 fL (ref 80.0–100.0)
Monocytes Absolute: 0.9 10*3/uL (ref 0.1–1.0)
Monocytes Relative: 20 %
Neutro Abs: 2.8 10*3/uL (ref 1.7–7.7)
Neutrophils Relative %: 61 %
Platelet Count: 291 10*3/uL (ref 150–400)
RBC: 4.87 MIL/uL (ref 3.87–5.11)
RDW: 21.9 % — ABNORMAL HIGH (ref 11.5–15.5)
WBC Count: 4.7 10*3/uL (ref 4.0–10.5)
nRBC: 0 % (ref 0.0–0.2)

## 2022-10-03 LAB — CMP (CANCER CENTER ONLY)
ALT: 28 U/L (ref 0–44)
AST: 27 U/L (ref 15–41)
Albumin: 3.5 g/dL (ref 3.5–5.0)
Alkaline Phosphatase: 66 U/L (ref 38–126)
Anion gap: 16 — ABNORMAL HIGH (ref 5–15)
BUN: 11 mg/dL (ref 8–23)
CO2: 21 mmol/L — ABNORMAL LOW (ref 22–32)
Calcium: 9.4 mg/dL (ref 8.9–10.3)
Chloride: 100 mmol/L (ref 98–111)
Creatinine: 0.67 mg/dL (ref 0.44–1.00)
GFR, Estimated: >60 mL/min (ref >60)
Glucose, Bld: 107 mg/dL — ABNORMAL HIGH (ref 70–99)
Potassium: 2.8 mmol/L — ABNORMAL LOW (ref 3.5–5.1)
Sodium: 137 mmol/L (ref 135–145)
Total Bilirubin: 0.5 mg/dL (ref 0.3–1.2)
Total Protein: 6.5 g/dL (ref 6.5–8.1)

## 2022-10-03 LAB — MAGNESIUM: Magnesium: 1.6 mg/dL — ABNORMAL LOW (ref 1.7–2.4)

## 2022-10-03 MED ORDER — POTASSIUM CHLORIDE 10 MEQ/100ML IV SOLN
10.0000 meq | INTRAVENOUS | Status: AC
  Administered 2022-10-03 (×4): 10 meq via INTRAVENOUS
  Filled 2022-10-03 (×4): qty 100

## 2022-10-03 MED ORDER — SODIUM CHLORIDE 0.9 % IV SOLN
8.0000 mg | Freq: Once | INTRAVENOUS | Status: AC
  Administered 2022-10-03: 8 mg via INTRAVENOUS
  Filled 2022-10-03: qty 4

## 2022-10-03 MED ORDER — SODIUM CHLORIDE 0.9 % IV SOLN: Freq: Once | INTRAVENOUS | Status: AC

## 2022-10-03 MED ORDER — ONDANSETRON HCL 4 MG/2ML IJ SOLN: 8.0000 mg | Freq: Once | INTRAMUSCULAR | Status: DC

## 2022-10-03 MED ORDER — FAMOTIDINE IN NACL 20-0.9 MG/50ML-% IV SOLN
20.0000 mg | Freq: Once | INTRAVENOUS | Status: AC
  Administered 2022-10-03: 20 mg via INTRAVENOUS
  Filled 2022-10-03: qty 50

## 2022-10-03 MED ORDER — SUCRALFATE 1 GM/10ML PO SUSP
1.0000 g | Freq: Three times a day (TID) | ORAL | 0 refills | Status: DC
  Filled 2022-10-03: qty 420, 11d supply, fill #0

## 2022-10-03 NOTE — Progress Notes (Signed)
Orders entered for upcoming visits for rehydration.

## 2022-10-03 NOTE — Telephone Encounter (Signed)
Unsuccessful attempt to reach patient on preferred number listed in notes for scheduled AWV. Left message on voicemail okay to reschedule. 

## 2022-10-04 NOTE — Progress Notes (Unsigned)
Symptom Management Consult Note [facility]    Patient Care Team: [name], [name] as PCP - General (Physician Assistant)    Name / MRN / DOB: [name]  [id number]  03-07-1950   Date of visit: 10/05/2022   Chief Complaint/Reason for visit: recheck labs   ASSESSMENT & PLAN: Patient is a 72 y.o. female with oncologic history of stage IIIA (T2a, N2, M0) non-small cell lung cancer, adenocarcinoma followed by [name].  I have viewed most recent oncology note and lab work.    #Stage IIIA (T2a, N2, M0) non-small cell lung cancer, adenocarcinoma  -Recently finished chemotherapy carboplatin and paclitaxel 09/18/22 -Final radiation treatment was 09/25/22 - Next appointment with oncologist is 10/24/22   #Hypokalemia -Potassium today is 2.8, no improvement after receiving 4 runs of IV potassium x 2 days ago. Has ongoing GI loss and poor PO intake likely contributing, -Will start on IVF 1 L NS and  IV potassium in clinic. -CMP also showing metabolic acidosis bicarb 20 and anion gap of 17.   #Nausea and vomiting -Intractable. Benign abdominal exam. - IV 8mg  Zofran and Pepcid administered for symptom management   [name] agrees with plan for admission Admission request for: hypokalemia and Intractable nausea and vomiting. Also updated daughter [name] by phone of the plan per patient's request.  Spoke with Dr. Erenest Blank with hospitalist service who agrees to assume care of patient and bring into the hospital for further evaluation and management.      Heme/Onc History: Oncology History  Adenocarcinoma of right lung, stage 3 (HCC)  03/31/2020 Initial Diagnosis   Primary lung cancer, right middle lobe - resected   03/31/2020 Cancer Staging   Staging form: Lung, AJCC 8th Edition - Pathologic stage from 03/31/2020: Stage IIIA (pT2a, pN2, cM0) - Signed by Si Gaul, MD on 08/01/2022 Histopathologic type: Adenocarcinoma, NOS Stage prefix: Initial diagnosis    08/07/2022 - 09/18/2022 Chemotherapy   Patient is on Treatment Plan : LUNG Carboplatin + Paclitaxel + XRT q7d         Interval history-: [name] is a 72 y.o. female with oncologic history as above presenting to [facility] today with chief complaint of nausea and vomiting. She is accompanied by a friend who provides additional history.  Patient states that she is feeling worse since her visit x 2 days ago.  She is endorsing persistent nausea and vomiting.  She had 7 episodes of nonbloody nonbilious emesis in the last 24 hours.  She is unable to tolerate p.o. medications because of pain in her esophagus that she describes as a burning and raw sensation.  She denies any issues swallowing. She did use a phenergan suppository last night which provided a few hours of symptom relief. She admits to only tolerating a few sips of water and a banana popsicle yesterday. She endorses generalized weakness and fatigue. She also reports her urine is dark in color, denies any dysuria. She has not had fever.     ROS  All other systems are reviewed and are negative for acute change except as noted in the HPI.    Allergies  Allergen Reactions   Codeine Hives   Surgical Lubricant Hives     Past Medical History:  Diagnosis Date   Abnormal chest x-ray 01/19/2020   Acute laryngopharyngitis 03/18/2020   Aortic atherosclerosis (HCC) 01/28/2020   Arthritis    Cardiac murmur 03/01/2020   COPD (chronic obstructive pulmonary disease) (HCC)    Coronary artery calcification  seen on CT scan 03/01/2020   Cough 01/12/2020   COVID-19 03/10/2020   Dyspnea    on exertion,after getting covid   Family history of adverse reaction to anesthesia    brothr had n/v   GERD (gastroesophageal reflux disease)    Hiatal hernia    Hyperlipemia    Lung cancer, middle lobe (HCC)    middle lobe removed   Pneumonia    PONV (postoperative nausea and vomiting)    Pre-operative cardiovascular examination 03/01/2020   Right lower  lobe lung mass 01/28/2020   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   Visit for screening mammogram 01/28/2020     Past Surgical History:  Procedure Laterality Date   CATARACT EXTRACTION Bilateral 2023   LUNG REMOVAL, PARTIAL  03/29/2020   middle lobe removed   VIDEO BRONCHOSCOPY WITH ENDOBRONCHIAL ULTRASOUND N/A 07/20/2022   Procedure: VIDEO BRONCHOSCOPY WITH ENDOBRONCHIAL ULTRASOUND;  Surgeon: [name], MD;  Location: [facility] OR;  Service: Thoracic;  Laterality: N/A;    Social History   Socioeconomic History   Marital status: Widowed    Spouse name: Not on file   Number of children: 1   Years of education: Not on file   Highest education level: GED or equivalent  Occupational History   Not on file  Tobacco Use   Smoking status: Former    Current packs/day: 0.00    Types: Cigarettes    Start date: 03/23/1967    Quit date: 03/22/2017    Years since quitting: 5.5   Smokeless tobacco: Never  Vaping Use   Vaping status: Never Used  Substance and Sexual Activity   Alcohol use: Never   Drug use: Never   Sexual activity: Not Currently  Other Topics Concern   Not on file  Social History Narrative   Not on file   Social Determinants of Health   Financial Resource Strain: Medium Risk (08/22/2022)   Overall Financial Resource Strain (CARDIA)    Difficulty of Paying Living Expenses: Somewhat hard  Food Insecurity: No Food Insecurity (08/22/2022)   Hunger Vital Sign    Worried About Running Out of Food in the Last Year: Never true    Ran Out of Food in the Last Year: Never true  Transportation Needs: No Transportation Needs (08/22/2022)   PRAPARE - Administrator, Civil Service (Medical): No    Lack of Transportation (Non-Medical): No  Physical Activity: Insufficiently Active (08/22/2022)   Exercise Vital Sign    Days of Exercise per Week: 4 days    Minutes of Exercise per Session: 10 min  Stress: No Stress Concern Present (08/22/2022)   [employer] of Occupational Health - Occupational Stress Questionnaire    Feeling of Stress : Not at all  Social Connections: Moderately Isolated (08/22/2022)   Social Connection and Isolation Panel [NHANES]    Frequency of Communication with Friends and Family: More than three times a week    Frequency of Social Gatherings with Friends and Family: More than three times a week    Attends Religious Services: 1 to 4 times per year    Active Member of [employer] or Organizations: No    Attends Banker Meetings: Never    Marital Status: Widowed  Intimate Partner Violence: Not At Risk (08/22/2022)   Humiliation, Afraid, Rape, and Kick questionnaire    Fear of Current or Ex-Partner: No    Emotionally Abused: No    Physically Abused: No    Sexually Abused:  No    Family History  Problem Relation Age of Onset   Heart disease Mother    Heart attack Mother    Cancer Father    Diabetes Father    Non-Hodgkin's lymphoma Brother    High Cholesterol Brother    High Cholesterol Brother    Heart disease Brother    Heart attack Brother      Current Outpatient Medications:    alum & mag hydroxide-simeth (MAALOX MAX) 400-400-40 MG/5ML suspension, Take 15 mLs by mouth every 6 (six) hours as needed for indigestion., Disp: 355 mL, Rfl: 0   benzonatate (TESSALON) 200 MG capsule, Take 1 capsule (200 mg total) by mouth 3 (three) times daily as needed for cough., Disp: 30 capsule, Rfl: 0   cetirizine (ZYRTEC) 10 MG tablet, Take 10 mg by mouth daily., Disp: , Rfl:    Coenzyme Q10 (CO Q-10 PO), Take 1 tablet by mouth daily., Disp: , Rfl:    cyclobenzaprine (FLEXERIL) 5 MG tablet, Take 1 tablet (5 mg total) by mouth 3 (three) times daily as needed for muscle spasms., Disp: 30 tablet, Rfl: 1   Fluticasone-Umeclidin-Vilant (TRELEGY ELLIPTA) 100-62.5-25 MCG/ACT AEPB, Inhale 1 puff into the lungs daily., Disp: 2 each, Rfl: 0   Homeopathic Products (LEG CRAMPS) SUBL, Place 2 tablets under the tongue daily as  needed (leg cramps)., Disp: , Rfl:    HYDROcodone-acetaminophen (HYCET) 7.5-325 mg/15 ml solution, Take 5-10 mLs by mouth every 4 (four) hours as needed for moderate pain., Disp: 473 mL, Rfl: 0   lidocaine (XYLOCAINE) 2 % solution, Use as directed 15 mLs in the mouth or throat every 6 (six) hours as needed for mouth pain., Disp: 100 mL, Rfl: 0   montelukast (SINGULAIR) 10 MG tablet, TAKE 1 TABLET AT BEDTIME, Disp: 90 tablet, Rfl: 3   Multiple Vitamin (MULTIVITAMIN ADULT PO), Take 2 tablets by mouth daily., Disp: , Rfl:    mupirocin ointment (BACTROBAN) 2 %, Apply 1 application topically 2 (two) times daily. (Patient taking differently: Apply 1 application  topically 2 (two) times daily as needed (wound care).), Disp: 22 g, Rfl: 2   omeprazole (PRILOSEC) 20 MG capsule, TAKE 1 CAPSULE TWICE DAILY BEFORE MEALS, Disp: 180 capsule, Rfl: 3   ondansetron (ZOFRAN-ODT) 8 MG disintegrating tablet, Take 1 tablet (8 mg total) by mouth every 8 (eight) hours as needed for nausea or vomiting., Disp: 30 tablet, Rfl: 2   polyethylene glycol (MIRALAX / GLYCOLAX) 17 g packet, Take 17 g by mouth daily as needed for moderate constipation., Disp: , Rfl:    potassium chloride SA (KLOR-CON M) 20 MEQ tablet, Take 1 tablet (20 mEq total) by mouth daily., Disp: 8 tablet, Rfl: 0   promethazine (PHENERGAN) 25 MG suppository, Place 1 suppository (25 mg total) rectally every 8 (eight) hours as needed for nausea or vomiting., Disp: 12 each, Rfl: 0   rosuvastatin (CRESTOR) 5 MG tablet, TAKE 1 TABLET EVERY DAY (Patient taking differently: Take 5 mg by mouth at bedtime.), Disp: 90 tablet, Rfl: 3   sucralfate (CARAFATE) 1 g tablet, Take 1 tablet (1 g total) by mouth 4 (four) times daily. Dissolve each tablet in 15 cc water before use., Disp: 120 tablet, Rfl: 2   sucralfate (CARAFATE) 1 GM/10ML suspension, Take 10 mLs (1 g total) by mouth 4 (four) times daily -  with meals and at bedtime., Disp: 420 mL, Rfl: 0    sulfamethoxazole-trimethoprim (BACTRIM DS) 800-160 MG tablet, Take 1 tablet by mouth 2 (two) times daily.,  Disp: 6 tablet, Rfl: 0   VITAMIN D PO, Take 2 tablets by mouth daily., Disp: , Rfl:  No current facility-administered medications for this visit.  Facility-Administered Medications Ordered in Other Visits:    potassium chloride 10 mEq in 100 mL IVPB, 10 mEq, Intravenous, Q1 Hr x 2, Walisiewicz,  E, PA-C, Last Rate: 100 mL/hr at 10/05/22 1148, 10 mEq at 10/05/22 1148  PHYSICAL EXAM: ECOG FS:2 - Symptomatic, <50% confined to bed    Vitals:   10/05/22 0937  BP: 117/73  Pulse: (!) 107  Resp: 16  Temp: (!) 97.4 F (36.3 C)  TempSrc: Oral  SpO2: 100%  Weight: 139 lb 11.2 oz (63.4 kg)   Physical Exam Vitals and nursing note reviewed.  Constitutional:      Appearance: She is not ill-appearing or toxic-appearing.  HENT:     Head: Normocephalic.     Mouth/Throat:     Mouth: Mucous membranes are dry.  Eyes:     Conjunctiva/sclera: Conjunctivae normal.  Cardiovascular:     Rate and Rhythm: Regular rhythm. Tachycardia present.     Pulses: Normal pulses.     Heart sounds: Normal heart sounds.  Pulmonary:     Effort: Pulmonary effort is normal.     Breath sounds: Normal breath sounds.  Abdominal:     General: There is no distension.     Palpations: Abdomen is soft.     Tenderness: There is no abdominal tenderness. There is no right CVA tenderness, left CVA tenderness, guarding or rebound.  Musculoskeletal:     Cervical back: Normal range of motion.  Skin:    General: Skin is warm and dry.  Neurological:     General: No focal deficit present.     Mental Status: She is alert.        LABORATORY DATA: I have reviewed the data as listed    Latest Ref Rng & Units 10/05/2022    8:51 AM 10/03/2022    9:03 AM 09/25/2022    8:01 AM  CBC  WBC 4.0 - 10.5 K/uL 6.0  4.7  2.9   Hemoglobin 12.0 - 15.0 g/dL 40.9  81.1  91.4   Hematocrit 36.0 - 46.0 % 40.6  40.3  38.0    Platelets 150 - 400 K/uL 255  291  364         Latest Ref Rng & Units 10/05/2022    8:51 AM 10/03/2022    9:03 AM 09/25/2022    8:01 AM  CMP  Glucose 70 - 99 mg/dL 86  782  956   BUN 8 - 23 mg/dL 7  11  13    Creatinine 0.44 - 1.00 mg/dL 2.13  0.86  5.78   Sodium 135 - 145 mmol/L 136  137  137   Potassium 3.5 - 5.1 mmol/L 2.8  2.8  3.1   Chloride 98 - 111 mmol/L 99  100  100   CO2 22 - 32 mmol/L 20  21  20    Calcium 8.9 - 10.3 mg/dL 8.8  9.4  8.7   Total Protein 6.5 - 8.1 g/dL 6.9  6.5  6.5   Total Bilirubin 0.3 - 1.2 mg/dL 0.5  0.5  0.5   Alkaline Phos 38 - 126 U/L 70  66  61   AST 15 - 41 U/L 33  27  50   ALT 0 - 44 U/L 29  28  57        RADIOGRAPHIC STUDIES (from last 24  hours if applicable) I have personally reviewed the radiological images as listed and agreed with the findings in the report. No results found.      Visit Diagnosis: 1. Adenocarcinoma of right lung, stage 3 (HCC)   2. Intractable nausea and vomiting   3. Hypokalemia      No orders of the defined types were placed in this encounter.   All questions were answered. The patient knows to call the clinic with any problems, questions or concerns. No barriers to learning was detected.  A total of more than 50 minutes were spent on this encounter with face-to-face time and non-face-to-face time, including preparing to see the patient, ordering tests and/or medications, counseling the patient and coordination of care as outlined above.    Thank you for allowing me to participate in the care of this patient.    Shanon Ace, PA-C Department of Hematology/Oncology [facility] at [facility] Phone: [phone]  [phone]    10/05/2022 11:55 AM

## 2022-10-05 ENCOUNTER — Inpatient Hospital Stay: Payer: Medicare HMO

## 2022-10-05 ENCOUNTER — Encounter (HOSPITAL_COMMUNITY): Payer: Self-pay

## 2022-10-05 ENCOUNTER — Inpatient Hospital Stay (HOSPITAL_COMMUNITY)
Admission: AD | Admit: 2022-10-05 | Discharge: 2022-10-13 | DRG: 982 | Disposition: A | Discharge status: Home / Self Care | Payer: Medicare HMO | Source: Ambulatory Visit | Attending: Family Medicine | Admitting: Family Medicine

## 2022-10-05 ENCOUNTER — Observation Stay (HOSPITAL_COMMUNITY): Payer: Medicare HMO

## 2022-10-05 ENCOUNTER — Inpatient Hospital Stay (HOSPITAL_BASED_OUTPATIENT_CLINIC_OR_DEPARTMENT_OTHER): Payer: Medicare HMO | Admitting: Physician Assistant

## 2022-10-05 ENCOUNTER — Encounter (HOSPITAL_COMMUNITY): Payer: Self-pay | Admitting: Internal Medicine

## 2022-10-05 ENCOUNTER — Other Ambulatory Visit: Payer: Self-pay

## 2022-10-05 VITALS — BP 117/73 | HR 107 | Temp 97.4°F | Resp 16 | Wt 139.7 lb

## 2022-10-05 VITALS — BP 141/83 | HR 100 | Resp 16

## 2022-10-05 DIAGNOSIS — I878 Other specified disorders of veins: Principal | ICD-10-CM

## 2022-10-05 DIAGNOSIS — Z79899 Other long term (current) drug therapy: Secondary | ICD-10-CM

## 2022-10-05 DIAGNOSIS — M62838 Other muscle spasm: Secondary | ICD-10-CM | POA: Diagnosis present

## 2022-10-05 DIAGNOSIS — E876 Hypokalemia: Secondary | ICD-10-CM | POA: Diagnosis not present

## 2022-10-05 DIAGNOSIS — K449 Diaphragmatic hernia without obstruction or gangrene: Secondary | ICD-10-CM | POA: Diagnosis present

## 2022-10-05 DIAGNOSIS — E860 Dehydration: Secondary | ICD-10-CM

## 2022-10-05 DIAGNOSIS — Z902 Acquired absence of lung [part of]: Secondary | ICD-10-CM

## 2022-10-05 DIAGNOSIS — Z91048 Other nonmedicinal substance allergy status: Secondary | ICD-10-CM

## 2022-10-05 DIAGNOSIS — Z83438 Family history of other disorder of lipoprotein metabolism and other lipidemia: Secondary | ICD-10-CM

## 2022-10-05 DIAGNOSIS — Z8249 Family history of ischemic heart disease and other diseases of the circulatory system: Secondary | ICD-10-CM

## 2022-10-05 DIAGNOSIS — Z885 Allergy status to narcotic agent status: Secondary | ICD-10-CM

## 2022-10-05 DIAGNOSIS — Z807 Family history of other malignant neoplasms of lymphoid, hematopoietic and related tissues: Secondary | ICD-10-CM

## 2022-10-05 DIAGNOSIS — Z833 Family history of diabetes mellitus: Secondary | ICD-10-CM

## 2022-10-05 DIAGNOSIS — C3491 Malignant neoplasm of unspecified part of right bronchus or lung: Secondary | ICD-10-CM

## 2022-10-05 DIAGNOSIS — Z87891 Personal history of nicotine dependence: Secondary | ICD-10-CM

## 2022-10-05 DIAGNOSIS — Z9841 Cataract extraction status, right eye: Secondary | ICD-10-CM

## 2022-10-05 DIAGNOSIS — K219 Gastro-esophageal reflux disease without esophagitis: Secondary | ICD-10-CM | POA: Diagnosis present

## 2022-10-05 DIAGNOSIS — T451X5A Adverse effect of antineoplastic and immunosuppressive drugs, initial encounter: Secondary | ICD-10-CM | POA: Diagnosis present

## 2022-10-05 DIAGNOSIS — Z923 Personal history of irradiation: Secondary | ICD-10-CM

## 2022-10-05 DIAGNOSIS — I2510 Atherosclerotic heart disease of native coronary artery without angina pectoris: Secondary | ICD-10-CM | POA: Diagnosis present

## 2022-10-05 DIAGNOSIS — Y842 Radiological procedure and radiotherapy as the cause of abnormal reaction of the patient, or of later complication, without mention of misadventure at the time of the procedure: Secondary | ICD-10-CM | POA: Diagnosis present

## 2022-10-05 DIAGNOSIS — J449 Chronic obstructive pulmonary disease, unspecified: Secondary | ICD-10-CM | POA: Diagnosis present

## 2022-10-05 DIAGNOSIS — R112 Nausea with vomiting, unspecified: Secondary | ICD-10-CM

## 2022-10-05 DIAGNOSIS — G893 Neoplasm related pain (acute) (chronic): Secondary | ICD-10-CM | POA: Diagnosis present

## 2022-10-05 DIAGNOSIS — E785 Hyperlipidemia, unspecified: Secondary | ICD-10-CM | POA: Diagnosis present

## 2022-10-05 DIAGNOSIS — C342 Malignant neoplasm of middle lobe, bronchus or lung: Secondary | ICD-10-CM | POA: Diagnosis present

## 2022-10-05 DIAGNOSIS — I700 Atherosclerosis of aorta: Secondary | ICD-10-CM | POA: Diagnosis present

## 2022-10-05 DIAGNOSIS — Z9842 Cataract extraction status, left eye: Secondary | ICD-10-CM

## 2022-10-05 DIAGNOSIS — K208 Other esophagitis without bleeding: Secondary | ICD-10-CM | POA: Diagnosis present

## 2022-10-05 DIAGNOSIS — F419 Anxiety disorder, unspecified: Secondary | ICD-10-CM | POA: Diagnosis present

## 2022-10-05 DIAGNOSIS — Z8616 Personal history of COVID-19: Secondary | ICD-10-CM

## 2022-10-05 DIAGNOSIS — K317 Polyp of stomach and duodenum: Secondary | ICD-10-CM | POA: Diagnosis present

## 2022-10-05 LAB — BASIC METABOLIC PANEL
Anion gap: 16 — ABNORMAL HIGH (ref 5–15)
BUN: 6 mg/dL — ABNORMAL LOW (ref 8–23)
CO2: 17 mmol/L — ABNORMAL LOW (ref 22–32)
Calcium: 8.3 mg/dL — ABNORMAL LOW (ref 8.9–10.3)
Chloride: 104 mmol/L (ref 98–111)
Creatinine, Ser: 0.53 mg/dL (ref 0.44–1.00)
GFR, Estimated: >60 mL/min (ref >60)
Glucose, Bld: 74 mg/dL (ref 70–99)
Potassium: 3 mmol/L — ABNORMAL LOW (ref 3.5–5.1)
Sodium: 137 mmol/L (ref 135–145)

## 2022-10-05 LAB — CBC WITH DIFFERENTIAL (CANCER CENTER ONLY)
Abs Immature Granulocytes: 0.08 10*3/uL — ABNORMAL HIGH (ref 0.00–0.07)
Basophils Absolute: 0.1 10*3/uL (ref 0.0–0.1)
Basophils Relative: 1 %
Eosinophils Absolute: 0 10*3/uL (ref 0.0–0.5)
Eosinophils Relative: 0 %
HCT: 40.6 % (ref 36.0–46.0)
Hemoglobin: 13.7 g/dL (ref 12.0–15.0)
Immature Granulocytes: 1 %
Lymphocytes Relative: 10 %
Lymphs Abs: 0.6 10*3/uL — ABNORMAL LOW (ref 0.7–4.0)
MCH: 28.4 pg (ref 26.0–34.0)
MCHC: 33.7 g/dL (ref 30.0–36.0)
MCV: 84.2 fL (ref 80.0–100.0)
Monocytes Absolute: 0.8 10*3/uL (ref 0.1–1.0)
Monocytes Relative: 14 %
Neutro Abs: 4.4 10*3/uL (ref 1.7–7.7)
Neutrophils Relative %: 74 %
Platelet Count: 255 10*3/uL (ref 150–400)
RBC: 4.82 MIL/uL (ref 3.87–5.11)
RDW: 21.5 % — ABNORMAL HIGH (ref 11.5–15.5)
WBC Count: 6 10*3/uL (ref 4.0–10.5)
nRBC: 0 % (ref 0.0–0.2)

## 2022-10-05 LAB — CMP (CANCER CENTER ONLY)
ALT: 29 U/L (ref 0–44)
AST: 33 U/L (ref 15–41)
Albumin: 3.7 g/dL (ref 3.5–5.0)
Alkaline Phosphatase: 70 U/L (ref 38–126)
Anion gap: 17 — ABNORMAL HIGH (ref 5–15)
BUN: 7 mg/dL — ABNORMAL LOW (ref 8–23)
CO2: 20 mmol/L — ABNORMAL LOW (ref 22–32)
Calcium: 8.8 mg/dL — ABNORMAL LOW (ref 8.9–10.3)
Chloride: 99 mmol/L (ref 98–111)
Creatinine: 0.57 mg/dL (ref 0.44–1.00)
GFR, Estimated: >60 mL/min (ref >60)
Glucose, Bld: 86 mg/dL (ref 70–99)
Potassium: 2.8 mmol/L — ABNORMAL LOW (ref 3.5–5.1)
Sodium: 136 mmol/L (ref 135–145)
Total Bilirubin: 0.5 mg/dL (ref 0.3–1.2)
Total Protein: 6.9 g/dL (ref 6.5–8.1)

## 2022-10-05 LAB — MAGNESIUM
Magnesium: 1.5 mg/dL — ABNORMAL LOW (ref 1.7–2.4)
Magnesium: 1.7 mg/dL (ref 1.7–2.4)

## 2022-10-05 MED ORDER — PANTOPRAZOLE SODIUM 40 MG IV SOLR
40.0000 mg | INTRAVENOUS | Status: DC
  Administered 2022-10-05 – 2022-10-06 (×2): 40 mg via INTRAVENOUS
  Filled 2022-10-05 (×2): qty 10

## 2022-10-05 MED ORDER — HYDROCODONE-ACETAMINOPHEN 7.5-325 MG/15ML PO SOLN
5.0000 mL | ORAL | Status: DC | PRN
  Administered 2022-10-11: 5 mL via ORAL
  Filled 2022-10-05: qty 15

## 2022-10-05 MED ORDER — FAMOTIDINE IN NACL 20-0.9 MG/50ML-% IV SOLN
20.0000 mg | Freq: Once | INTRAVENOUS | Status: AC
  Administered 2022-10-05: 20 mg via INTRAVENOUS
  Filled 2022-10-05: qty 50

## 2022-10-05 MED ORDER — MAGNESIUM SULFATE 2 GM/50ML IV SOLN
2.0000 g | Freq: Once | INTRAVENOUS | Status: AC
  Administered 2022-10-05: 2 g via INTRAVENOUS
  Filled 2022-10-05: qty 50

## 2022-10-05 MED ORDER — TRAZODONE HCL 50 MG PO TABS: 25.0000 mg | ORAL_TABLET | Freq: Every evening | ORAL | Status: DC | PRN

## 2022-10-05 MED ORDER — ALBUTEROL SULFATE (2.5 MG/3ML) 0.083% IN NEBU: 2.5000 mg | INHALATION_SOLUTION | RESPIRATORY_TRACT | Status: DC | PRN

## 2022-10-05 MED ORDER — ONDANSETRON HCL 4 MG PO TABS: 4.0000 mg | ORAL_TABLET | Freq: Four times a day (QID) | ORAL | Status: DC | PRN

## 2022-10-05 MED ORDER — ENOXAPARIN SODIUM 40 MG/0.4ML IJ SOSY
40.0000 mg | PREFILLED_SYRINGE | Freq: Every day | INTRAMUSCULAR | Status: AC
  Administered 2022-10-05 – 2022-10-06 (×2): 40 mg via SUBCUTANEOUS
  Filled 2022-10-05 (×2): qty 0.4

## 2022-10-05 MED ORDER — ALUM & MAG HYDROXIDE-SIMETH 200-200-20 MG/5ML PO SUSP: 15.0000 mL | Freq: Four times a day (QID) | ORAL | Status: DC | PRN

## 2022-10-05 MED ORDER — ALUM & MAG HYDROXIDE-SIMETH 400-400-40 MG/5ML PO SUSP: 15.0000 mL | Freq: Four times a day (QID) | ORAL | Status: DC | PRN

## 2022-10-05 MED ORDER — ONDANSETRON HCL 4 MG/2ML IJ SOLN
4.0000 mg | Freq: Four times a day (QID) | INTRAMUSCULAR | Status: DC | PRN
  Administered 2022-10-05 – 2022-10-12 (×8): 4 mg via INTRAVENOUS
  Filled 2022-10-05 (×8): qty 2

## 2022-10-05 MED ORDER — POTASSIUM CHLORIDE 10 MEQ/100ML IV SOLN
10.0000 meq | INTRAVENOUS | Status: AC
  Administered 2022-10-05 (×2): 10 meq via INTRAVENOUS
  Filled 2022-10-05 (×2): qty 100

## 2022-10-05 MED ORDER — ACETAMINOPHEN 325 MG PO TABS: 650.0000 mg | ORAL_TABLET | Freq: Four times a day (QID) | ORAL | Status: DC | PRN

## 2022-10-05 MED ORDER — CYCLOBENZAPRINE HCL 5 MG PO TABS: 5.0000 mg | ORAL_TABLET | Freq: Three times a day (TID) | ORAL | Status: DC | PRN

## 2022-10-05 MED ORDER — POTASSIUM CHLORIDE 10 MEQ/100ML IV SOLN
10.0000 meq | INTRAVENOUS | Status: AC
  Administered 2022-10-05 (×6): 10 meq via INTRAVENOUS
  Filled 2022-10-05 (×3): qty 100

## 2022-10-05 MED ORDER — FLUTICASONE FUROATE-VILANTEROL 100-25 MCG/ACT IN AEPB: 1.0000 | INHALATION_SPRAY | Freq: Every day | RESPIRATORY_TRACT | Status: DC | PRN

## 2022-10-05 MED ORDER — UMECLIDINIUM BROMIDE 62.5 MCG/ACT IN AEPB: 1.0000 | INHALATION_SPRAY | Freq: Every day | RESPIRATORY_TRACT | Status: DC | PRN

## 2022-10-05 MED ORDER — SUCRALFATE 1 GM/10ML PO SUSP
1.0000 g | Freq: Three times a day (TID) | ORAL | Status: DC
  Administered 2022-10-05: 1 g via ORAL
  Filled 2022-10-05 (×3): qty 10

## 2022-10-05 MED ORDER — SODIUM CHLORIDE 0.9 % IV SOLN
Freq: Once | INTRAVENOUS | Status: AC
  Filled 2022-10-05: qty 4

## 2022-10-05 MED ORDER — FLUTICASONE FUROATE-VILANTEROL 100-25 MCG/ACT IN AEPB: 1.0000 | INHALATION_SPRAY | Freq: Every day | RESPIRATORY_TRACT | Status: DC

## 2022-10-05 MED ORDER — ENSURE ENLIVE PO LIQD
237.0000 mL | Freq: Three times a day (TID) | ORAL | Status: DC
  Administered 2022-10-08: 237 mL via ORAL

## 2022-10-05 MED ORDER — SODIUM CHLORIDE 0.9 % IV SOLN: INTRAVENOUS | Status: DC

## 2022-10-05 MED ORDER — UMECLIDINIUM BROMIDE 62.5 MCG/ACT IN AEPB: 1.0000 | INHALATION_SPRAY | Freq: Every day | RESPIRATORY_TRACT | Status: DC

## 2022-10-05 MED ORDER — ACETAMINOPHEN 650 MG RE SUPP: 650.0000 mg | Freq: Four times a day (QID) | RECTAL | Status: DC | PRN

## 2022-10-05 MED ORDER — SODIUM CHLORIDE 0.9 % IV SOLN: Freq: Once | INTRAVENOUS | Status: AC

## 2022-10-05 NOTE — H&P (Signed)
History and Physical  [name]:[id number] DOB: 1950-04-20 DOA: 10/05/2022  PCP: [name], PA-C   Chief Complaint: Intractable vomiting  HPI: [name] is a 72 y.o. female with medical history significant for GERD, hiatal hernia, hyperlipidemia, non-small cell lung cancer status post final radiation 09/25/2022 and ongoing chemotherapy with carboplatin and paclitaxel under the care of Dr. Arbutus Ped who is being admitted to the hospital with intractable nausea vomiting, and associated hypokalemia.  History is provided by the patient as well as her close family friend who is at the bedside with her this afternoon.  Patient has had multiple recent follow-ups with [facility] symptom management clinic due to continued intractable vomiting with any oral intake including liquids, as well as resulting hypokalemia.  She is not having any dysphagia, but she has a burning epigastric discomfort with any oral intake even thin liquids.  Usually about 30 minutes after p.o. intake, she vomits.  She has been constipated, but having some bowel movements with suppository.  She notes that each time she goes to the cancer clinic and is given some IV Pepcid, seems to do better for about 24 hours.  She was recently prescribed some liquid sucralfate, this seemed to slightly improve her symptoms.  She denies any chest pain, hematemesis, fevers, chills, blood in her stool.  No significant abdominal pain other than the burning epigastric discomfort after oral intake.  Today in the oncology clinic she was found to have recurrent hypokalemia, was given IV fluids, IV potassium and directly admitted to [facility].  Review of Systems: Please see HPI for pertinent positives and negatives. A complete 10 system review of systems are otherwise negative.  Past Medical History:  Diagnosis Date   Abnormal chest x-ray 01/19/2020   Acute laryngopharyngitis 03/18/2020   Aortic atherosclerosis (HCC) 01/28/2020    Arthritis    Cardiac murmur 03/01/2020   COPD (chronic obstructive pulmonary disease) (HCC)    Coronary artery calcification seen on CT scan 03/01/2020   Cough 01/12/2020   COVID-19 03/10/2020   Dyspnea    on exertion,after getting covid   Family history of adverse reaction to anesthesia    brothr had n/v   GERD (gastroesophageal reflux disease)    Hiatal hernia    Hyperlipemia    Lung cancer, middle lobe (HCC)    middle lobe removed   Pneumonia    PONV (postoperative nausea and vomiting)    Pre-operative cardiovascular examination 03/01/2020   Right lower lobe lung mass 01/28/2020   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 Her smoking use included cigarettes. She started smoking about 55 years ago. She has never used smokeless tobacco. She reports that she does not drink alcohol and does not use drugs.   Allergies  Allergen Reactions   Codeine Hives   Surgical Lubricant Hives    Family History  Problem Relation Age of Onset   Heart disease Mother    Heart attack Mother    Cancer Father    Diabetes Father  Non-Hodgkin's lymphoma Brother    High Cholesterol Brother    High Cholesterol Brother    Heart disease Brother    Heart attack Brother      Prior to Admission medications   Medication Sig Start Date End Date Taking? Authorizing Provider  alum & mag hydroxide-simeth (MAALOX MAX) 400-400-40 MG/5ML suspension Take 15 mLs by mouth every 6 (six) hours as needed for indigestion.  09/10/22   Lonell Grandchild, MD  benzonatate (TESSALON) 200 MG capsule Take 1 capsule (200 mg total) by mouth 3 (three) times daily as needed for cough. 06/06/22   [name], PA-C  cetirizine (ZYRTEC) 10 MG tablet Take 10 mg by mouth daily. 06/22/20   [provider]  Coenzyme Q10 (CO Q-10 PO) Take 1 tablet by mouth daily.    [provider]  cyclobenzaprine (FLEXERIL) 5 MG tablet Take 1 tablet (5 mg total) by mouth 3 (three) times daily as needed for muscle spasms. 04/13/22   [name], PA-C  Fluticasone-Umeclidin-Vilant (TRELEGY ELLIPTA) 100-62.5-25 MCG/ACT AEPB Inhale 1 puff into the lungs daily. 05/06/21   Janie Morning, NP  Homeopathic Products (LEG CRAMPS) SUBL Place 2 tablets under the tongue daily as needed (leg cramps).    [provider]  HYDROcodone-acetaminophen (HYCET) 7.5-325 mg/15 ml solution Take 5-10 mLs by mouth every 4 (four) hours as needed for moderate pain. 09/14/22   Ronny Bacon, PA-C  lidocaine (XYLOCAINE) 2 % solution Use as directed 15 mLs in the mouth or throat every 6 (six) hours as needed for mouth pain. 09/10/22   Lonell Grandchild, MD  montelukast (SINGULAIR) 10 MG tablet TAKE 1 TABLET AT BEDTIME 02/02/22   [name], PA-C  Multiple Vitamin (MULTIVITAMIN ADULT PO) Take 2 tablets by mouth daily.    [provider]  mupirocin ointment (BACTROBAN) 2 % Apply 1 application topically 2 (two) times daily. Patient taking differently: Apply 1 application  topically 2 (two) times daily as needed (wound care). 12/10/20   [name], PA-C  omeprazole (PRILOSEC) 20 MG capsule TAKE 1 CAPSULE TWICE DAILY BEFORE MEALS 02/02/22   [name], PA-C  ondansetron (ZOFRAN-ODT) 8 MG disintegrating tablet Take 1 tablet (8 mg total) by mouth every 8 (eight) hours as needed for nausea or vomiting. 09/18/22   Heilingoetter, Cassandra L, PA-C  polyethylene glycol (MIRALAX / GLYCOLAX) 17 g packet Take 17 g by mouth daily as needed for moderate  constipation.    [provider]  potassium chloride SA (KLOR-CON M) 20 MEQ tablet Take 1 tablet (20 mEq total) by mouth daily. 09/18/22   Heilingoetter, Cassandra L, PA-C  promethazine (PHENERGAN) 25 MG suppository Place 1 suppository (25 mg total) rectally every 8 (eight) hours as needed for nausea or vomiting. 09/25/22   Si Gaul, MD  rosuvastatin (CRESTOR) 5 MG tablet TAKE 1 TABLET EVERY DAY Patient taking differently: Take 5 mg by mouth at bedtime. 02/02/22   [name], PA-C  sucralfate (CARAFATE) 1 g tablet Take 1 tablet (1 g total) by mouth 4 (four) times daily. Dissolve each tablet in 15 cc water before use. 08/18/22   Dorothy Puffer, MD  sucralfate (CARAFATE) 1 GM/10ML suspension Take 10 mLs (1 g total) by mouth 4 (four) times daily -  with meals and at bedtime. 10/03/22   Walisiewicz, Yvonna Alanis E, PA-C  sulfamethoxazole-trimethoprim (BACTRIM DS) 800-160 MG tablet Take 1 tablet by mouth 2 (two) times daily. 09/05/22   Si Gaul, MD  VITAMIN D PO Take 2 tablets by  mouth daily.    [provider]    Physical Exam: BP (!) 118/57 (BP Location: Left Arm)   Pulse 91   Temp 97.8 F (36.6 C) (Oral)   Resp 16   SpO2 98%   General:  Alert, oriented, calm, in no acute distress, family friend at bedside Eyes: EOMI, clear conjuctivae, white sclerea Neck: supple, no masses, trachea mildline  Cardiovascular: RRR, no murmurs or rubs, no peripheral edema  Respiratory: clear to auscultation bilaterally, no wheezes, no crackles  Abdomen: soft, nontender, nondistended, normal bowel tones heard  Skin: dry, no rashes, radiation changes on her back, without acute findings such as evidence of infection Musculoskeletal: no joint effusions, normal range of motion  Psychiatric: appropriate affect, normal speech  Neurologic: extraocular muscles intact, clear speech, moving all extremities with intact sensorium          Labs on Admission:  Basic Metabolic Panel: Recent Labs  Lab  10/03/22 0903 10/05/22 0851 10/05/22 1334  NA 137 136 137  K 2.8* 2.8* 3.0*  CL 100 99 104  CO2 21* 20* 17*  GLUCOSE 107* 86 74  BUN 11 7* 6*  CREATININE 0.67 0.57 0.53  CALCIUM 9.4 8.8* 8.3*  MG 1.6* 1.5* 1.7   Liver Function Tests: Recent Labs  Lab 10/03/22 0903 10/05/22 0851  AST 27 33  ALT 28 29  ALKPHOS 66 70  BILITOT 0.5 0.5  PROT 6.5 6.9  ALBUMIN 3.5 3.7   No results for input(s): "LIPASE", "AMYLASE" in the last 168 hours. No results for input(s): "AMMONIA" in the last 168 hours. CBC: Recent Labs  Lab 10/03/22 0903 10/05/22 0851  WBC 4.7 6.0  NEUTROABS 2.8 4.4  HGB 13.5 13.7  HCT 40.3 40.6  MCV 82.8 84.2  PLT 291 255   Cardiac Enzymes: No results for input(s): "CKTOTAL", "CKMB", "CKMBINDEX", "TROPONINI" in the last 168 hours.  BNP (last 3 results) No results for input(s): "BNP" in the last 8760 hours.  ProBNP (last 3 results) No results for input(s): "PROBNP" in the last 8760 hours.  CBG: No results for input(s): "GLUCAP" in the last 168 hours.  Radiological Exams on Admission: No results found.  Assessment/Plan Tatina Lavinder is a 72 y.o. female with medical history significant for GERD, hiatal hernia, hyperlipidemia, non-small cell lung cancer status post final radiation 09/25/2022 and ongoing chemotherapy with carboplatin and paclitaxel under the care of Dr. Arbutus Ped who is being admitted to the hospital with intractable nausea vomiting, and associated hypokalemia.   Intractable vomiting-with burning epigastric discomfort, and vomiting with p.o. intake.  I suspect this may be due to severe acid reflux in the setting of known hiatal hernia, and recently exacerbated by radiation. Note CT abdomen pelvis 7/21 without acute findings.  Abdomen is nonacute, nontender, nondistended.  I doubt abdominal infection, etc. -Observation admission -IV fluids -IV PPI daily -Sucralfate suspension with meals -Follow-up abdominal x-ray  -IV Zofran as needed -If  continues to have p.o. intolerance, may consider GI for possible endoscopy -Ensure 3 times daily between meals  Hypokalemia-likely due to intractable vomiting -IV KCl runs x 6 -Recheck potassium level in the morning  GERD-IV PPI  Muscle spasms-Flexeril as needed  Cancer pain-continue Hycet as needed  DVT prophylaxis: Lovenox     Code Status: Full Code  Consults called: None  Admission status: Observation  Time spent: 46 minutes   Sharlette Dense MD [facility] Pager [phone]  If 7PM-7AM, please contact night-coverage [email or web address] Password [email or web address]  10/05/2022, 2:26 PM

## 2022-10-05 NOTE — Plan of Care (Signed)
Plan of Care Note for Accepted Transfer   Patient: [name]    QMV:[id number]     Facility requesting transfer: [facility] Requesting Provider: Namon Cirri, PA-C  Patient is a 72 y.o. female with oncologic history of stage IIIA (T2a, N2, M0) non-small cell lung cancer, adenocarcinoma followed by Dr. Arbutus Ped. Recently completed radiation and has had multiple clinic visits with vomiting, sore throat and hypokalemia. Returns today with same so requesting direct admission for management.  Most recent vitals, labs and radiology:  There were no vitals taken for this visit.      Latest Ref Rng & Units 10/05/2022    8:51 AM 10/03/2022    9:03 AM 09/25/2022    8:01 AM  CBC  WBC 4.0 - 10.5 K/uL 6.0  4.7  2.9   Hemoglobin 12.0 - 15.0 g/dL 28.4  13.2  44.0   Hematocrit 36.0 - 46.0 % 40.6  40.3  38.0   Platelets 150 - 400 K/uL 255  291  364       Latest Ref Rng & Units 10/05/2022    8:51 AM 10/03/2022    9:03 AM 09/25/2022    8:01 AM  BMP  Glucose 70 - 99 mg/dL 86  102  725   BUN 8 - 23 mg/dL 7  11  13    Creatinine 0.44 - 1.00 mg/dL 3.66  4.40  3.47   Sodium 135 - 145 mmol/L 136  137  137   Potassium 3.5 - 5.1 mmol/L 2.8  2.8  3.1   Chloride 98 - 111 mmol/L 99  100  100   CO2 22 - 32 mmol/L 20  21  20    Calcium 8.9 - 10.3 mg/dL 8.8  9.4  8.7      No results found.   The patient has been accepted for transfer to [facility].  The patient will remain under the care and responsibility of the referring provider until they have arrived to our inpatient facility.  Author:  Sharlette Dense, MD  10/05/2022  Check [email or web address] for on-call coverage.  Nursing staff, Please call [facility] Admits & Consults System-Wide number on Amion as soon as patient's arrival, so appropriate admitting provider can evaluate the pt.

## 2022-10-05 NOTE — Plan of Care (Signed)
  Problem: Clinical Measurements: Goal: Ability to maintain clinical measurements within normal limits will improve Outcome: Progressing   Problem: Clinical Measurements: Goal: Will remain free from infection Outcome: Progressing   Problem: Activity: Goal: Risk for activity intolerance will decrease Outcome: Progressing   Problem: Pain Managment: Goal: General experience of comfort will improve Outcome: Progressing   Problem: Safety: Goal: Ability to remain free from injury will improve Outcome: Progressing   Problem: Skin Integrity: Goal: Risk for impaired skin integrity will decrease Outcome: Progressing

## 2022-10-05 NOTE — Progress Notes (Signed)
Report given to Nate, RN on 6E. Patient to be roomed in 1602-01.

## 2022-10-06 DIAGNOSIS — Z885 Allergy status to narcotic agent status: Secondary | ICD-10-CM | POA: Diagnosis not present

## 2022-10-06 DIAGNOSIS — I2510 Atherosclerotic heart disease of native coronary artery without angina pectoris: Secondary | ICD-10-CM | POA: Diagnosis present

## 2022-10-06 DIAGNOSIS — K209 Esophagitis, unspecified without bleeding: Secondary | ICD-10-CM | POA: Diagnosis not present

## 2022-10-06 DIAGNOSIS — G893 Neoplasm related pain (acute) (chronic): Secondary | ICD-10-CM | POA: Diagnosis present

## 2022-10-06 DIAGNOSIS — R112 Nausea with vomiting, unspecified: Secondary | ICD-10-CM

## 2022-10-06 DIAGNOSIS — R1013 Epigastric pain: Secondary | ICD-10-CM | POA: Diagnosis not present

## 2022-10-06 DIAGNOSIS — K449 Diaphragmatic hernia without obstruction or gangrene: Secondary | ICD-10-CM | POA: Diagnosis present

## 2022-10-06 DIAGNOSIS — C342 Malignant neoplasm of middle lobe, bronchus or lung: Secondary | ICD-10-CM | POA: Diagnosis present

## 2022-10-06 DIAGNOSIS — F419 Anxiety disorder, unspecified: Secondary | ICD-10-CM | POA: Diagnosis present

## 2022-10-06 DIAGNOSIS — K208 Other esophagitis without bleeding: Secondary | ICD-10-CM | POA: Diagnosis present

## 2022-10-06 DIAGNOSIS — K219 Gastro-esophageal reflux disease without esophagitis: Secondary | ICD-10-CM

## 2022-10-06 DIAGNOSIS — Z923 Personal history of irradiation: Secondary | ICD-10-CM | POA: Diagnosis not present

## 2022-10-06 DIAGNOSIS — I700 Atherosclerosis of aorta: Secondary | ICD-10-CM | POA: Diagnosis present

## 2022-10-06 DIAGNOSIS — Y842 Radiological procedure and radiotherapy as the cause of abnormal reaction of the patient, or of later complication, without mention of misadventure at the time of the procedure: Secondary | ICD-10-CM | POA: Diagnosis present

## 2022-10-06 DIAGNOSIS — Z91048 Other nonmedicinal substance allergy status: Secondary | ICD-10-CM | POA: Diagnosis not present

## 2022-10-06 DIAGNOSIS — Z833 Family history of diabetes mellitus: Secondary | ICD-10-CM | POA: Diagnosis not present

## 2022-10-06 DIAGNOSIS — Z8616 Personal history of COVID-19: Secondary | ICD-10-CM | POA: Diagnosis not present

## 2022-10-06 DIAGNOSIS — K317 Polyp of stomach and duodenum: Secondary | ICD-10-CM | POA: Diagnosis present

## 2022-10-06 DIAGNOSIS — Z902 Acquired absence of lung [part of]: Secondary | ICD-10-CM | POA: Diagnosis not present

## 2022-10-06 DIAGNOSIS — Z807 Family history of other malignant neoplasms of lymphoid, hematopoietic and related tissues: Secondary | ICD-10-CM | POA: Diagnosis not present

## 2022-10-06 DIAGNOSIS — Z452 Encounter for adjustment and management of vascular access device: Secondary | ICD-10-CM | POA: Diagnosis not present

## 2022-10-06 DIAGNOSIS — J449 Chronic obstructive pulmonary disease, unspecified: Secondary | ICD-10-CM | POA: Diagnosis present

## 2022-10-06 DIAGNOSIS — T451X5A Adverse effect of antineoplastic and immunosuppressive drugs, initial encounter: Secondary | ICD-10-CM | POA: Diagnosis present

## 2022-10-06 DIAGNOSIS — E876 Hypokalemia: Secondary | ICD-10-CM | POA: Diagnosis present

## 2022-10-06 DIAGNOSIS — Z8249 Family history of ischemic heart disease and other diseases of the circulatory system: Secondary | ICD-10-CM | POA: Diagnosis not present

## 2022-10-06 DIAGNOSIS — E785 Hyperlipidemia, unspecified: Secondary | ICD-10-CM | POA: Diagnosis present

## 2022-10-06 DIAGNOSIS — Z87891 Personal history of nicotine dependence: Secondary | ICD-10-CM | POA: Diagnosis not present

## 2022-10-06 DIAGNOSIS — Z9989 Dependence on other enabling machines and devices: Secondary | ICD-10-CM | POA: Diagnosis not present

## 2022-10-06 LAB — CBC
HCT: 35.3 % — ABNORMAL LOW (ref 36.0–46.0)
Hemoglobin: 11.2 g/dL — ABNORMAL LOW (ref 12.0–15.0)
MCH: 27.7 pg (ref 26.0–34.0)
MCHC: 31.7 g/dL (ref 30.0–36.0)
MCV: 87.2 fL (ref 80.0–100.0)
Platelets: 205 10*3/uL (ref 150–400)
RBC: 4.05 MIL/uL (ref 3.87–5.11)
RDW: 22.3 % — ABNORMAL HIGH (ref 11.5–15.5)
WBC: 3.1 10*3/uL — ABNORMAL LOW (ref 4.0–10.5)
nRBC: 0 % (ref 0.0–0.2)

## 2022-10-06 LAB — BASIC METABOLIC PANEL
Anion gap: 15 (ref 5–15)
BUN: <5 mg/dL — ABNORMAL LOW (ref 8–23)
CO2: 14 mmol/L — ABNORMAL LOW (ref 22–32)
Calcium: 8.1 mg/dL — ABNORMAL LOW (ref 8.9–10.3)
Chloride: 102 mmol/L (ref 98–111)
Creatinine, Ser: 0.64 mg/dL (ref 0.44–1.00)
GFR, Estimated: >60 mL/min (ref >60)
Glucose, Bld: 70 mg/dL (ref 70–99)
Potassium: 3.3 mmol/L — ABNORMAL LOW (ref 3.5–5.1)
Sodium: 131 mmol/L — ABNORMAL LOW (ref 135–145)

## 2022-10-06 LAB — MAGNESIUM: Magnesium: 1.9 mg/dL (ref 1.7–2.4)

## 2022-10-06 MED ORDER — PANTOPRAZOLE SODIUM 40 MG IV SOLR
40.0000 mg | Freq: Two times a day (BID) | INTRAVENOUS | Status: DC
  Administered 2022-10-06 – 2022-10-13 (×14): 40 mg via INTRAVENOUS
  Filled 2022-10-06 (×14): qty 10

## 2022-10-06 MED ORDER — SODIUM CHLORIDE 0.9 % IV SOLN
12.5000 mg | Freq: Four times a day (QID) | INTRAVENOUS | Status: DC | PRN
  Administered 2022-10-06 – 2022-10-09 (×7): 12.5 mg via INTRAVENOUS
  Filled 2022-10-06: qty 12.5
  Filled 2022-10-06: qty 0.5
  Filled 2022-10-06 (×6): qty 12.5
  Filled 2022-10-06: qty 0.5

## 2022-10-06 MED ORDER — POTASSIUM CHLORIDE 10 MEQ/100ML IV SOLN
10.0000 meq | INTRAVENOUS | Status: AC
  Administered 2022-10-06 (×4): 10 meq via INTRAVENOUS
  Filled 2022-10-06 (×4): qty 100

## 2022-10-06 MED ORDER — METOCLOPRAMIDE HCL 5 MG/ML IJ SOLN
5.0000 mg | Freq: Four times a day (QID) | INTRAMUSCULAR | Status: DC | PRN
  Administered 2022-10-06 – 2022-10-09 (×6): 5 mg via INTRAVENOUS
  Filled 2022-10-06 (×7): qty 2

## 2022-10-06 MED ORDER — ORAL CARE MOUTH RINSE: 15.0000 mL | OROMUCOSAL | Status: DC | PRN

## 2022-10-06 NOTE — Plan of Care (Signed)

## 2022-10-06 NOTE — Consult Note (Cosign Needed)
Consultation Note   Referring Provider:  [facility] PCP: [name], PA-C Primary Gastroenterologist: Dr. Lynann Bologna        Reason for Consultation: Intractable nausea & vomiting DOA: 10/05/2022         Hospital Day: 2   ASSESSMENT & PLAN   72 y.o. year old female with PMH of GERD, HH,COPD, hyperlipidemia, non-small cell lung cancer status post final radiation 09/25/2022 and ongoing chemotherapy with carboplatin and paclitaxel under the care of Dr. Arbutus Ped.  Intractable nausea and vomiting with epigastric pain. Patient has hx of GERD with HH. Her symptoms were not managed outpatient due to not being able to tolerate po. CT scan (7/24) with no acute findings. Abd x-ray negative. -NPO -Continue PRN antiemetics  - Protonix 40mg  IV twice daily - Schedule endoscopy for tomorrow to evaluate, explained risks and benefits to pt and she agrees. -all po meds on hold -Stop lovenex after PM dose   GERD. Patient has history of GERD with HH and was taking PPI therapy prior to admission. -Continue Pantoprazole 40mg  IV twice daily -Schedule endoscopy for tomorrow   Hypokalemia, current 3.3 likely due to poor intake -being repleted IV fluids and Potassium, continue to monitor.   Additional co-morbidities include   Non-small cell lung CA status post radiation 09/25/2022 with ongoing chemotherapy with Dr. Arbutus Ped.  HISTORY OF PRESENT ILLNESS   Pt appears to ER with main complaint of intractable nausea/vomiting with burning epigastric pain.She states her symptoms started at about the same time she started radiation therapy 3 weeks ago. She has GERD and was taking Omeprazole 20 mg po daily at home. She states due to the nausea she was unable to tolerate anything oral and stopped medication. She admits she feels very weak and tired due to no caloric intake. Denies fever/chills. Denies SOB or chest pain.   Previous GI Evaluations   Abd  xray: FINDINGS: No bowel dilatation or evidence of obstruction. No free air. A pessary is noted in the pelvis. Degenerative changes of the spine. No acute osseous pathology.   CT scan: IMPRESSION: 1. No acute CT findings of the abdomen or pelvis to explain right upper quadrant pain. 2. Hepatic steatosis. 3. No evidence of lymphadenopathy or metastatic disease in the abdomen or pelvis.  Colonoscopy 12/2016  Labs and Imaging: Recent Labs    10/05/22 0851 10/06/22 0537  WBC 6.0 3.1*  HGB 13.7 11.2*  HCT 40.6 35.3*  PLT 255 205   Recent Labs    10/05/22 0851 10/05/22 1334 10/06/22 0537  NA 136 137 131*  K 2.8* 3.0* 3.3*  CL 99 104 102  CO2 20* 17* 14*  GLUCOSE 86 74 70  BUN 7* 6* <5*  CREATININE 0.57 0.53 0.64  CALCIUM 8.8* 8.3* 8.1*   Recent Labs    10/05/22 0851  PROT 6.9  ALBUMIN 3.7  AST 33  ALT 29  ALKPHOS 70  BILITOT 0.5   No results for input(s): "HEPBSAG", "HCVAB", "HEPAIGM", "HEPBIGM" in the last 72 hours. No results for input(s): "LABPROT", "INR" in the last 72 hours.    Past Medical History:  Diagnosis Date   Abnormal chest x-ray 01/19/2020   Acute laryngopharyngitis 03/18/2020   Aortic atherosclerosis (HCC) 01/28/2020  Arthritis    Cardiac murmur 03/01/2020   COPD (chronic obstructive pulmonary disease) (HCC)    Coronary artery calcification seen on CT scan 03/01/2020   Cough 01/12/2020   COVID-19 03/10/2020   Dyspnea    on exertion,after getting covid   Family history of adverse reaction to anesthesia    brothr had n/v   GERD (gastroesophageal reflux disease)    Hiatal hernia    Hyperlipemia    Lung cancer, middle lobe (HCC)    middle lobe removed   Pneumonia    PONV (postoperative nausea and vomiting)    Pre-operative cardiovascular examination 03/01/2020   Right lower lobe lung mass 01/28/2020   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   Visit for screening mammogram 01/28/2020    Past Surgical History:   Procedure Laterality Date   CATARACT EXTRACTION Bilateral 2023   LUNG REMOVAL, PARTIAL  03/29/2020   middle lobe removed   VIDEO BRONCHOSCOPY WITH ENDOBRONCHIAL ULTRASOUND N/A 07/20/2022   Procedure: VIDEO BRONCHOSCOPY WITH ENDOBRONCHIAL ULTRASOUND;  Surgeon: [name], MD;  Location: [facility] OR;  Service: Thoracic;  Laterality: N/A;    Family History  Problem Relation Age of Onset   Heart disease Mother    Heart attack Mother    Cancer Father    Diabetes Father    Non-Hodgkin's lymphoma Brother    High Cholesterol Brother    High Cholesterol Brother    Heart disease Brother    Heart attack Brother     Prior to Admission medications   Medication Sig Start Date End Date Taking? Authorizing Provider  alum & mag hydroxide-simeth (MAALOX MAX) 400-400-40 MG/5ML suspension Take 15 mLs by mouth every 6 (six) hours as needed for indigestion. 09/10/22  Yes Lonell Grandchild, MD  Coenzyme Q10 (CO Q-10 PO) Take 1 tablet by mouth daily.   Yes [provider]  cyclobenzaprine (FLEXERIL) 5 MG tablet Take 1 tablet (5 mg total) by mouth 3 (three) times daily as needed for muscle spasms. 04/13/22  Yes [name], PA-C  Fluticasone-Umeclidin-Vilant (TRELEGY ELLIPTA) 100-62.5-25 MCG/ACT AEPB Inhale 1 puff into the lungs daily. Patient taking differently: Inhale 1 puff into the lungs daily as needed (For shortness of breath). 05/06/21  Yes Janie Morning, NP  Homeopathic Products (LEG CRAMPS) SUBL Place 2 tablets under the tongue daily as needed (leg cramps).   Yes [provider]  HYDROcodone-acetaminophen (HYCET) 7.5-325 mg/15 ml solution Take 5-10 mLs by mouth every 4 (four) hours as needed for moderate pain. 09/14/22  Yes Ronny Bacon, PA-C  lidocaine (XYLOCAINE) 2 % solution Use as directed 15 mLs in the mouth or throat every 6 (six) hours as needed for mouth pain. 09/10/22  Yes Lonell Grandchild, MD  montelukast (SINGULAIR) 10 MG tablet TAKE 1 TABLET AT  BEDTIME Patient taking differently: Take 10 mg by mouth at bedtime. 02/02/22  Yes [name], PA-C  Multiple Vitamin (MULTIVITAMIN ADULT PO) Take 2 tablets by mouth daily.   Yes [provider]  mupirocin ointment (BACTROBAN) 2 % Apply 1 application topically 2 (two) times daily. Patient taking differently: Apply 1 application  topically 2 (two) times daily as needed (wound care). 12/10/20  Yes [name], PA-C  omeprazole (PRILOSEC) 20 MG capsule TAKE 1 CAPSULE TWICE DAILY BEFORE MEALS Patient taking differently: Take 20 mg by mouth daily. 02/02/22  Yes [name], PA-C  ondansetron (ZOFRAN-ODT) 8 MG disintegrating tablet Take 1 tablet (8 mg total) by mouth every 8 (eight) hours as  needed for nausea or vomiting. 09/18/22  Yes Heilingoetter, Cassandra L, PA-C  polyethylene glycol (MIRALAX / GLYCOLAX) 17 g packet Take 17 g by mouth daily as needed for moderate constipation.   Yes [provider]  potassium chloride SA (KLOR-CON M) 20 MEQ tablet Take 1 tablet (20 mEq total) by mouth daily. 09/18/22  Yes Heilingoetter, Cassandra L, PA-C  promethazine (PHENERGAN) 25 MG suppository Place 1 suppository (25 mg total) rectally every 8 (eight) hours as needed for nausea or vomiting. 09/25/22  Yes Si Gaul, MD  rosuvastatin (CRESTOR) 5 MG tablet TAKE 1 TABLET EVERY DAY Patient taking differently: Take 5 mg by mouth at bedtime. 02/02/22  Yes [name], PA-C  sucralfate (CARAFATE) 1 g tablet Take 1 tablet (1 g total) by mouth 4 (four) times daily. Dissolve each tablet in 15 cc water before use. Patient taking differently: Take 1 g by mouth 4 (four) times daily. 08/18/22  Yes Dorothy Puffer, MD  sucralfate (CARAFATE) 1 GM/10ML suspension Take 10 mLs (1 g total) by mouth 4 (four) times daily -  with meals and at bedtime. 10/03/22  Yes Walisiewicz, Kaitlyn E, PA-C  VITAMIN D PO Take 2 tablets by mouth daily.   Yes [provider]  benzonatate (TESSALON) 200 MG capsule Take 1 capsule  (200 mg total) by mouth 3 (three) times daily as needed for cough. Patient not taking: Reported on 10/05/2022 06/06/22   [name], PA-C  cetirizine (ZYRTEC) 10 MG tablet Take 10 mg by mouth daily. Patient not taking: Reported on 10/05/2022 06/22/20   [provider]  sulfamethoxazole-trimethoprim (BACTRIM DS) 800-160 MG tablet Take 1 tablet by mouth 2 (two) times daily. Patient not taking: Reported on 10/05/2022 09/05/22   Si Gaul, MD    Current Facility-Administered Medications  Medication Dose Route Frequency Provider Last Rate Last Admin   0.9 %  sodium chloride infusion   Intravenous Continuous Kirby Crigler, Mir M, MD 75 mL/hr at 10/06/22 1302 New Bag at 10/06/22 1302   acetaminophen (TYLENOL) tablet 650 mg  650 mg Oral Q6H PRN Maryln Gottron, MD       Or   acetaminophen (TYLENOL) suppository 650 mg  650 mg Rectal Q6H PRN Kirby Crigler, Mir M, MD       albuterol (PROVENTIL) (2.5 MG/3ML) 0.083% nebulizer solution 2.5 mg  2.5 mg Nebulization Q2H PRN Kirby Crigler, Mir M, MD       alum & mag hydroxide-simeth (MAALOX/MYLANTA) 200-200-20 MG/5ML suspension 15 mL  15 mL Oral Q6H PRN Kirby Crigler, Mir M, MD       cyclobenzaprine (FLEXERIL) tablet 5 mg  5 mg Oral TID PRN Kirby Crigler, Mir M, MD       enoxaparin (LOVENOX) injection 40 mg  40 mg Subcutaneous QHS Kirby Crigler, Mir M, MD   40 mg at 10/05/22 2022   feeding supplement (ENSURE ENLIVE / ENSURE PLUS) liquid 237 mL  237 mL Oral TID BM Kirby Crigler, Mir M, MD       fluticasone furoate-vilanterol (BREO ELLIPTA) 100-25 MCG/ACT 1 puff  1 puff Inhalation Daily PRN Kirby Crigler, Mir M, MD       And   umeclidinium bromide (INCRUSE ELLIPTA) 62.5 MCG/ACT 1 puff  1 puff Inhalation Daily PRN Kirby Crigler, Mir M, MD       HYDROcodone-acetaminophen (HYCET) 7.5-325 mg/15 ml solution 5-10 mL  5-10 mL Oral Q4H PRN Kirby Crigler, Mir M, MD       metoCLOPramide (REGLAN) injection 5 mg  5 mg Intravenous Q6H PRN Alwyn Ren, MD  5 mg at 10/06/22 0952    ondansetron (ZOFRAN) tablet 4 mg  4 mg Oral Q6H PRN Kirby Crigler, Mir M, MD       Or   ondansetron [facility]) injection 4 mg  4 mg Intravenous Q6H PRN Maryln Gottron, MD   4 mg at 10/06/22 0751   Oral care mouth rinse  15 mL Mouth Rinse PRN Alwyn Ren, MD       pantoprazole (PROTONIX) injection 40 mg  40 mg Intravenous Q24H Kirby Crigler, Mir M, MD   40 mg at 10/05/22 1325   promethazine (PHENERGAN) 12.5 mg in sodium chloride 0.9 % 50 mL IVPB  12.5 mg Intravenous Q6H PRN Alwyn Ren, MD       sucralfate (CARAFATE) 1 GM/10ML suspension 1 g  1 g Oral TID WC & HS Kirby Crigler, Mir M, MD   1 g at 10/05/22 1555   traZODone (DESYREL) tablet 25 mg  25 mg Oral QHS PRN Maryln Gottron, MD        Allergies as of 10/05/2022 - Review Complete 10/05/2022  Allergen Reaction Noted   Codeine Hives 12/08/2019   Surgical lubricant Hives 12/08/2019    Social History   Socioeconomic History   Marital status: Widowed    Spouse name: Not on file   Number of children: 1   Years of education: Not on file   Highest education level: GED or equivalent  Occupational History   Not on file  Tobacco Use   Smoking status: Former    Current packs/day: 0.00    Types: Cigarettes    Start date: 03/23/1967    Quit date: 03/22/2017    Years since quitting: 5.5   Smokeless tobacco: Never  Vaping Use   Vaping status: Never Used  Substance and Sexual Activity   Alcohol use: Never   Drug use: Never   Sexual activity: Not Currently  Other Topics Concern   Not on file  Social History Narrative   Not on file   Social Determinants of Health   Financial Resource Strain: Medium Risk (08/22/2022)   Overall Financial Resource Strain (CARDIA)    Difficulty of Paying Living Expenses: Somewhat hard  Food Insecurity: No Food Insecurity (10/05/2022)   Hunger Vital Sign    Worried About Running Out of Food in the Last Year: Never true    Ran Out of Food in the Last Year: Never true  Transportation Needs: No  Transportation Needs (10/05/2022)   PRAPARE - Administrator, Civil Service (Medical): No    Lack of Transportation (Non-Medical): No  Physical Activity: Insufficiently Active (08/22/2022)   Exercise Vital Sign    Days of Exercise per Week: 4 days    Minutes of Exercise per Session: 10 min  Stress: No Stress Concern Present (08/22/2022)   [employer] of Occupational Health - Occupational Stress Questionnaire    Feeling of Stress : Not at all  Social Connections: Moderately Isolated (08/22/2022)   Social Connection and Isolation Panel [NHANES]    Frequency of Communication with Friends and Family: More than three times a week    Frequency of Social Gatherings with Friends and Family: More than three times a week    Attends Religious Services: 1 to 4 times per year    Active Member of [employer] or Organizations: No    Attends Banker Meetings: Never    Marital Status: Widowed  Intimate Partner Violence: Not At Risk (10/05/2022)   Humiliation, Afraid, Rape, and  Kick questionnaire    Fear of Current or Ex-Partner: No    Emotionally Abused: No    Physically Abused: No    Sexually Abused: No     Code Status   Code Status: Full Code  Review of Systems: All systems reviewed and negative except where noted in HPI.  Physical Exam: Vital signs in last 24 hours: Temp:  [97.8 F (36.6 C)-98.1 F (36.7 C)] 98 F (36.7 C) (08/16 0550) Pulse Rate:  [87-94] 87 (08/16 0550) Resp:  [16-18] 18 (08/16 0550) BP: (132-141)/(61-74) 141/69 (08/16 0550) SpO2:  [96 %-98 %] 97 % (08/16 0550) Weight:  [63.4 kg] 63.4 kg (08/15 1400) Last BM Date : 10/02/22  General:  Pleasant female in NAD Psych:  Cooperative. Normal mood and affect Eyes: Pupils equal Ears:  Normal auditory acuity Nose: No deformity, discharge or lesions Neck:  Supple, no masses felt Lungs:  Clear to auscultation.  Heart:  Regular rate, regular rhythm.  Abdomen:  Soft, nondistended, nontender, active bowel  sounds, no masses felt Rectal :  Deferred Msk: Symmetrical without gross deformities.  Neurologic:  Alert, oriented, grossly normal neurologically Extremities : No edema Skin:  Intact without significant lesions.    Intake/Output from previous day: 08/15 0701 - 08/16 0700 In: 197.5 [I.V.:138.8; IV Piggyback:58.8] Out: -  Intake/Output this shift:  Total I/O In: 2004 [I.V.:1322; IV Piggyback:682] Out: -   Principal Problem:   Hypokalemia due to excessive gastrointestinal loss of potassium   Deanna May, NP-C @  10/06/2022, 1:55 PM

## 2022-10-06 NOTE — Care Management Obs Status (Signed)
MEDICARE OBSERVATION STATUS NOTIFICATION   Patient Details  Name: [name] MRN: [id number] Date of Birth: 11-Feb-1951   Medicare Observation Status Notification Given:  Yes    Beckie Busing, RN 10/06/2022, 12:33 PM

## 2022-10-06 NOTE — Progress Notes (Signed)
PROGRESS NOTE    [name]  PIR:[id number] DOB: 06/23/1950 DOA: 10/05/2022 PCP: [name], PA-C    Brief Narrative:  72 y.o. female with medical history significant for GERD, hiatal hernia, hyperlipidemia, non-small cell lung cancer status post final radiation 09/25/2022 and ongoing chemotherapy with carboplatin and paclitaxel under the care of Dr. Arbutus Ped who is being admitted to the hospital with intractable nausea vomiting, and associated hypokalemia.  Patient has had multiple recent follow-ups with [facility] symptom management clinic due to continued intractable vomiting with any oral intake including liquids, as well as resulting hypokalemia.  She is not having any dysphagia, but she has a burning epigastric discomfort with any oral intake even thin liquids.  Usually about 30 minutes after p.o. intake, she vomits.  She has been constipated, but having some bowel movements with suppository.  She notes that each time she goes to the cancer clinic and is given some IV Pepcid, seems to do better for about 24 hours.  She was recently prescribed some liquid sucralfate, this seemed to slightly improve her symptoms.  She denies any chest pain, hematemesis, fevers, chills, blood in her stool.  No significant abdominal pain other than the burning epigastric discomfort after oral intake.  Today in the oncology clinic she was found to have recurrent hypokalemia, was given IV fluids, IV potassium and directly admitted to [facility].    Assessment & Plan:   Principal Problem:   Hypokalemia due to excessive gastrointestinal loss of potassium   #1 Intractable nausea and vomiting-gastritis versus esophagitis versus severe GERD versus radiation induced treated symptomatically with IV fluids PPI Zofran and Phenergan.  GI consulted and appreciate their recommendations.   #2 hypokalemia being repleted aggressively.  Recheck labs in AM.  Check mag levels.  #3 non-small cell lung CA status post  radiation 09/25/2022 with ongoing chemotherapy Dr. Rhina Brackett is requesting a porta cath due to poor iv access  Miscellaneous- all po meds on hold  Estimated body mass index is 21.89 kg/m as calculated from the following:   Height as of this encounter: 5\' 7"  (1.702 m).   Weight as of this encounter: 63.4 kg.  DVT prophylaxis: lovenox Code Status: full Family Communication: none Disposition Plan:  Status is: inpatient  The patient will require care spanning > 2 midnights and should be moved to inpatient because: intractable nausea vomiting   Consultants:  Onc gi  Procedures:none Antimicrobials: none  Subjective: Nauseated vomiting actively no response to Zofran appears ill Denies abdominal pain Do not remember when the last BM was  Objective: Vitals:   10/05/22 1744 10/06/22 0149 10/06/22 0500 10/06/22 0550  BP: 132/61 133/74 134/70 (!) 141/69  Pulse: 93 94 90 87  Resp: 16 17 17 18   Temp: 98.1 F (36.7 C) 97.8 F (36.6 C) 97.8 F (36.6 C) 98 F (36.7 C)  TempSrc: Oral Oral Oral Oral  SpO2: 98% 96% 98% 97%  Weight:      Height:        Intake/Output Summary (Last 24 hours) at 10/06/2022 1118 Last data filed at 10/06/2022 0946 Gross per 24 hour  Intake 2201.48 ml  Output --  Net 2201.48 ml   Filed Weights   10/05/22 1400  Weight: 63.4 kg    Examination:  General exam: Appears ill  respiratory system: Clear to auscultation. Respiratory effort normal. Cardiovascular system: S1 & S2 heard, RRR. No JVD, murmurs, rubs, gallops or clicks. No pedal edema. Gastrointestinal system: Abdomen is nondistended, soft and nontender.  No organomegaly or masses felt. Normal bowel sounds heard. Central nervous system: Alert and oriented. No focal neurological deficits. Extremities: No edema   Data Reviewed: I have personally reviewed following labs and imaging studies  CBC: Recent Labs  Lab 10/03/22 0903 10/05/22 0851 10/06/22 0537  WBC 4.7 6.0 3.1*  NEUTROABS 2.8 4.4   --   HGB 13.5 13.7 11.2*  HCT 40.3 40.6 35.3*  MCV 82.8 84.2 87.2  PLT 291 255 205   Basic Metabolic Panel: Recent Labs  Lab 10/03/22 0903 10/05/22 0851 10/05/22 1334 10/06/22 0537  NA 137 136 137 131*  K 2.8* 2.8* 3.0* 3.3*  CL 100 99 104 102  CO2 21* 20* 17* 14*  GLUCOSE 107* 86 74 70  BUN 11 7* 6* <5*  CREATININE 0.67 0.57 0.53 0.64  CALCIUM 9.4 8.8* 8.3* 8.1*  MG 1.6* 1.5* 1.7 1.9   GFR: Estimated Creatinine Clearance: 60.9 mL/min (by C-G formula based on SCr of 0.64 mg/dL). Liver Function Tests: Recent Labs  Lab 10/03/22 0903 10/05/22 0851  AST 27 33  ALT 28 29  ALKPHOS 66 70  BILITOT 0.5 0.5  PROT 6.5 6.9  ALBUMIN 3.5 3.7   No results for input(s): "LIPASE", "AMYLASE" in the last 168 hours. No results for input(s): "AMMONIA" in the last 168 hours. Coagulation Profile: No results for input(s): "INR", "PROTIME" in the last 168 hours. Cardiac Enzymes: No results for input(s): "CKTOTAL", "CKMB", "CKMBINDEX", "TROPONINI" in the last 168 hours. BNP (last 3 results) No results for input(s): "PROBNP" in the last 8760 hours. HbA1C: No results for input(s): "HGBA1C" in the last 72 hours. CBG: No results for input(s): "GLUCAP" in the last 168 hours. Lipid Profile: No results for input(s): "CHOL", "HDL", "LDLCALC", "TRIG", "CHOLHDL", "LDLDIRECT" in the last 72 hours. Thyroid Function Tests: No results for input(s): "TSH", "T4TOTAL", "FREET4", "T3FREE", "THYROIDAB" in the last 72 hours. Anemia Panel: No results for input(s): "VITAMINB12", "FOLATE", "FERRITIN", "TIBC", "IRON", "RETICCTPCT" in the last 72 hours. Sepsis Labs: No results for input(s): "PROCALCITON", "LATICACIDVEN" in the last 168 hours.  No results found for this or any previous visit (from the past 240 hour(s)).       Radiology Studies: DG Abd 1 View  Result Date: 10/05/2022 CLINICAL DATA:  Intractable nausea vomiting.  Lung cancer. EXAM: ABDOMEN - 1 VIEW COMPARISON:  CT abdomen pelvis dated  09/10/2022. FINDINGS: No bowel dilatation or evidence of obstruction. No free air. A pessary is noted in the pelvis. Degenerative changes of the spine. No acute osseous pathology. IMPRESSION: Nonobstructive bowel gas pattern. Electronically Signed   By: Elgie Collard M.D.   On: 10/05/2022 17:41        Scheduled Meds:  enoxaparin (LOVENOX) injection  40 mg Subcutaneous QHS   feeding supplement  237 mL Oral TID BM   pantoprazole (PROTONIX) IV  40 mg Intravenous Q24H   sucralfate  1 g Oral TID WC & HS   Continuous Infusions:  sodium chloride 75 mL/hr at 10/06/22 0902   potassium chloride 10 mEq (10/06/22 1100)   promethazine (PHENERGAN) injection (IM or IVPB)       LOS: 0 days    Time spent: 39 minutes  Alwyn Ren, MD 10/06/2022, 11:18 AM

## 2022-10-06 NOTE — H&P (View-Only) (Signed)
Consultation Note   Referring Provider:  [facility] PCP: [name], PA-C Primary Gastroenterologist: Dr. Lynann Bologna        Reason for Consultation: Intractable nausea & vomiting DOA: 10/05/2022         Hospital Day: 2   ASSESSMENT & PLAN   72 y.o. year old female with PMH of GERD, HH,COPD, hyperlipidemia, non-small cell lung cancer status post final radiation 09/25/2022 and ongoing chemotherapy with carboplatin and paclitaxel under the care of Dr. Arbutus Ped.  Intractable nausea and vomiting with epigastric pain. Patient has hx of GERD with HH. Her symptoms were not managed outpatient due to not being able to tolerate po. CT scan (7/24) with no acute findings. Abd x-ray negative. -NPO -Continue PRN antiemetics  - Protonix 40mg  IV twice daily - Schedule endoscopy for tomorrow to evaluate, explained risks and benefits to pt and she agrees. -all po meds on hold -Stop lovenex after PM dose   GERD. Patient has history of GERD with HH and was taking PPI therapy prior to admission. -Continue Pantoprazole 40mg  IV twice daily -Schedule endoscopy for tomorrow   Hypokalemia, current 3.3 likely due to poor intake -being repleted IV fluids and Potassium, continue to monitor.   Additional co-morbidities include   Non-small cell lung CA status post radiation 09/25/2022 with ongoing chemotherapy with Dr. Arbutus Ped.  HISTORY OF PRESENT ILLNESS   Pt appears to ER with main complaint of intractable nausea/vomiting with burning epigastric pain.She states her symptoms started at about the same time she started radiation therapy 3 weeks ago. She has GERD and was taking Omeprazole 20 mg po daily at home. She states due to the nausea she was unable to tolerate anything oral and stopped medication. She admits she feels very weak and tired due to no caloric intake. Denies fever/chills. Denies SOB or chest pain.   Previous GI Evaluations   Abd  xray: FINDINGS: No bowel dilatation or evidence of obstruction. No free air. A pessary is noted in the pelvis. Degenerative changes of the spine. No acute osseous pathology.   CT scan: IMPRESSION: 1. No acute CT findings of the abdomen or pelvis to explain right upper quadrant pain. 2. Hepatic steatosis. 3. No evidence of lymphadenopathy or metastatic disease in the abdomen or pelvis.  Colonoscopy 12/2016  Labs and Imaging: Recent Labs    10/05/22 0851 10/06/22 0537  WBC 6.0 3.1*  HGB 13.7 11.2*  HCT 40.6 35.3*  PLT 255 205   Recent Labs    10/05/22 0851 10/05/22 1334 10/06/22 0537  NA 136 137 131*  K 2.8* 3.0* 3.3*  CL 99 104 102  CO2 20* 17* 14*  GLUCOSE 86 74 70  BUN 7* 6* <5*  CREATININE 0.57 0.53 0.64  CALCIUM 8.8* 8.3* 8.1*   Recent Labs    10/05/22 0851  PROT 6.9  ALBUMIN 3.7  AST 33  ALT 29  ALKPHOS 70  BILITOT 0.5   No results for input(s): "HEPBSAG", "HCVAB", "HEPAIGM", "HEPBIGM" in the last 72 hours. No results for input(s): "LABPROT", "INR" in the last 72 hours.    Past Medical History:  Diagnosis Date   Abnormal chest x-ray 01/19/2020   Acute laryngopharyngitis 03/18/2020   Aortic atherosclerosis (HCC) 01/28/2020  Arthritis    Cardiac murmur 03/01/2020   COPD (chronic obstructive pulmonary disease) (HCC)    Coronary artery calcification seen on CT scan 03/01/2020   Cough 01/12/2020   COVID-19 03/10/2020   Dyspnea    on exertion,after getting covid   Family history of adverse reaction to anesthesia    brothr had n/v   GERD (gastroesophageal reflux disease)    Hiatal hernia    Hyperlipemia    Lung cancer, middle lobe (HCC)    middle lobe removed   Pneumonia    PONV (postoperative nausea and vomiting)    Pre-operative cardiovascular examination 03/01/2020   Right lower lobe lung mass 01/28/2020   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   Visit for screening mammogram 01/28/2020    Past Surgical History:   Procedure Laterality Date   CATARACT EXTRACTION Bilateral 2023   LUNG REMOVAL, PARTIAL  03/29/2020   middle lobe removed   VIDEO BRONCHOSCOPY WITH ENDOBRONCHIAL ULTRASOUND N/A 07/20/2022   Procedure: VIDEO BRONCHOSCOPY WITH ENDOBRONCHIAL ULTRASOUND;  Surgeon: [name], MD;  Location: [facility] OR;  Service: Thoracic;  Laterality: N/A;    Family History  Problem Relation Age of Onset   Heart disease Mother    Heart attack Mother    Cancer Father    Diabetes Father    Non-Hodgkin's lymphoma Brother    High Cholesterol Brother    High Cholesterol Brother    Heart disease Brother    Heart attack Brother     Prior to Admission medications   Medication Sig Start Date End Date Taking? Authorizing Provider  alum & mag hydroxide-simeth (MAALOX MAX) 400-400-40 MG/5ML suspension Take 15 mLs by mouth every 6 (six) hours as needed for indigestion. 09/10/22  Yes Lonell Grandchild, MD  Coenzyme Q10 (CO Q-10 PO) Take 1 tablet by mouth daily.   Yes [provider]  cyclobenzaprine (FLEXERIL) 5 MG tablet Take 1 tablet (5 mg total) by mouth 3 (three) times daily as needed for muscle spasms. 04/13/22  Yes [name], PA-C  Fluticasone-Umeclidin-Vilant (TRELEGY ELLIPTA) 100-62.5-25 MCG/ACT AEPB Inhale 1 puff into the lungs daily. Patient taking differently: Inhale 1 puff into the lungs daily as needed (For shortness of breath). 05/06/21  Yes Janie Morning, NP  Homeopathic Products (LEG CRAMPS) SUBL Place 2 tablets under the tongue daily as needed (leg cramps).   Yes [provider]  HYDROcodone-acetaminophen (HYCET) 7.5-325 mg/15 ml solution Take 5-10 mLs by mouth every 4 (four) hours as needed for moderate pain. 09/14/22  Yes Ronny Bacon, PA-C  lidocaine (XYLOCAINE) 2 % solution Use as directed 15 mLs in the mouth or throat every 6 (six) hours as needed for mouth pain. 09/10/22  Yes Lonell Grandchild, MD  montelukast (SINGULAIR) 10 MG tablet TAKE 1 TABLET AT  BEDTIME Patient taking differently: Take 10 mg by mouth at bedtime. 02/02/22  Yes [name], PA-C  Multiple Vitamin (MULTIVITAMIN ADULT PO) Take 2 tablets by mouth daily.   Yes [provider]  mupirocin ointment (BACTROBAN) 2 % Apply 1 application topically 2 (two) times daily. Patient taking differently: Apply 1 application  topically 2 (two) times daily as needed (wound care). 12/10/20  Yes [name], PA-C  omeprazole (PRILOSEC) 20 MG capsule TAKE 1 CAPSULE TWICE DAILY BEFORE MEALS Patient taking differently: Take 20 mg by mouth daily. 02/02/22  Yes [name], PA-C  ondansetron (ZOFRAN-ODT) 8 MG disintegrating tablet Take 1 tablet (8 mg total) by mouth every 8 (eight) hours as  needed for nausea or vomiting. 09/18/22  Yes Heilingoetter, Cassandra L, PA-C  polyethylene glycol (MIRALAX / GLYCOLAX) 17 g packet Take 17 g by mouth daily as needed for moderate constipation.   Yes [provider]  potassium chloride SA (KLOR-CON M) 20 MEQ tablet Take 1 tablet (20 mEq total) by mouth daily. 09/18/22  Yes Heilingoetter, Cassandra L, PA-C  promethazine (PHENERGAN) 25 MG suppository Place 1 suppository (25 mg total) rectally every 8 (eight) hours as needed for nausea or vomiting. 09/25/22  Yes Si Gaul, MD  rosuvastatin (CRESTOR) 5 MG tablet TAKE 1 TABLET EVERY DAY Patient taking differently: Take 5 mg by mouth at bedtime. 02/02/22  Yes [name], PA-C  sucralfate (CARAFATE) 1 g tablet Take 1 tablet (1 g total) by mouth 4 (four) times daily. Dissolve each tablet in 15 cc water before use. Patient taking differently: Take 1 g by mouth 4 (four) times daily. 08/18/22  Yes Dorothy Puffer, MD  sucralfate (CARAFATE) 1 GM/10ML suspension Take 10 mLs (1 g total) by mouth 4 (four) times daily -  with meals and at bedtime. 10/03/22  Yes Walisiewicz, Kaitlyn E, PA-C  VITAMIN D PO Take 2 tablets by mouth daily.   Yes [provider]  benzonatate (TESSALON) 200 MG capsule Take 1 capsule  (200 mg total) by mouth 3 (three) times daily as needed for cough. Patient not taking: Reported on 10/05/2022 06/06/22   [name], PA-C  cetirizine (ZYRTEC) 10 MG tablet Take 10 mg by mouth daily. Patient not taking: Reported on 10/05/2022 06/22/20   [provider]  sulfamethoxazole-trimethoprim (BACTRIM DS) 800-160 MG tablet Take 1 tablet by mouth 2 (two) times daily. Patient not taking: Reported on 10/05/2022 09/05/22   Si Gaul, MD    Current Facility-Administered Medications  Medication Dose Route Frequency Provider Last Rate Last Admin   0.9 %  sodium chloride infusion   Intravenous Continuous Kirby Crigler, Mir M, MD 75 mL/hr at 10/06/22 1302 New Bag at 10/06/22 1302   acetaminophen (TYLENOL) tablet 650 mg  650 mg Oral Q6H PRN Maryln Gottron, MD       Or   acetaminophen (TYLENOL) suppository 650 mg  650 mg Rectal Q6H PRN Kirby Crigler, Mir M, MD       albuterol (PROVENTIL) (2.5 MG/3ML) 0.083% nebulizer solution 2.5 mg  2.5 mg Nebulization Q2H PRN Kirby Crigler, Mir M, MD       alum & mag hydroxide-simeth (MAALOX/MYLANTA) 200-200-20 MG/5ML suspension 15 mL  15 mL Oral Q6H PRN Kirby Crigler, Mir M, MD       cyclobenzaprine (FLEXERIL) tablet 5 mg  5 mg Oral TID PRN Kirby Crigler, Mir M, MD       enoxaparin (LOVENOX) injection 40 mg  40 mg Subcutaneous QHS Kirby Crigler, Mir M, MD   40 mg at 10/05/22 2022   feeding supplement (ENSURE ENLIVE / ENSURE PLUS) liquid 237 mL  237 mL Oral TID BM Kirby Crigler, Mir M, MD       fluticasone furoate-vilanterol (BREO ELLIPTA) 100-25 MCG/ACT 1 puff  1 puff Inhalation Daily PRN Kirby Crigler, Mir M, MD       And   umeclidinium bromide (INCRUSE ELLIPTA) 62.5 MCG/ACT 1 puff  1 puff Inhalation Daily PRN Kirby Crigler, Mir M, MD       HYDROcodone-acetaminophen (HYCET) 7.5-325 mg/15 ml solution 5-10 mL  5-10 mL Oral Q4H PRN Kirby Crigler, Mir M, MD       metoCLOPramide (REGLAN) injection 5 mg  5 mg Intravenous Q6H PRN Alwyn Ren, MD  5 mg at 10/06/22 0952    ondansetron (ZOFRAN) tablet 4 mg  4 mg Oral Q6H PRN Kirby Crigler, Mir M, MD       Or   ondansetron [facility]) injection 4 mg  4 mg Intravenous Q6H PRN Maryln Gottron, MD   4 mg at 10/06/22 0751   Oral care mouth rinse  15 mL Mouth Rinse PRN Alwyn Ren, MD       pantoprazole (PROTONIX) injection 40 mg  40 mg Intravenous Q24H Kirby Crigler, Mir M, MD   40 mg at 10/05/22 1325   promethazine (PHENERGAN) 12.5 mg in sodium chloride 0.9 % 50 mL IVPB  12.5 mg Intravenous Q6H PRN Alwyn Ren, MD       sucralfate (CARAFATE) 1 GM/10ML suspension 1 g  1 g Oral TID WC & HS Kirby Crigler, Mir M, MD   1 g at 10/05/22 1555   traZODone (DESYREL) tablet 25 mg  25 mg Oral QHS PRN Maryln Gottron, MD        Allergies as of 10/05/2022 - Review Complete 10/05/2022  Allergen Reaction Noted   Codeine Hives 12/08/2019   Surgical lubricant Hives 12/08/2019    Social History   Socioeconomic History   Marital status: Widowed    Spouse name: Not on file   Number of children: 1   Years of education: Not on file   Highest education level: GED or equivalent  Occupational History   Not on file  Tobacco Use   Smoking status: Former    Current packs/day: 0.00    Types: Cigarettes    Start date: 03/23/1967    Quit date: 03/22/2017    Years since quitting: 5.5   Smokeless tobacco: Never  Vaping Use   Vaping status: Never Used  Substance and Sexual Activity   Alcohol use: Never   Drug use: Never   Sexual activity: Not Currently  Other Topics Concern   Not on file  Social History Narrative   Not on file   Social Determinants of Health   Financial Resource Strain: Medium Risk (08/22/2022)   Overall Financial Resource Strain (CARDIA)    Difficulty of Paying Living Expenses: Somewhat hard  Food Insecurity: No Food Insecurity (10/05/2022)   Hunger Vital Sign    Worried About Running Out of Food in the Last Year: Never true    Ran Out of Food in the Last Year: Never true  Transportation Needs: No  Transportation Needs (10/05/2022)   PRAPARE - Administrator, Civil Service (Medical): No    Lack of Transportation (Non-Medical): No  Physical Activity: Insufficiently Active (08/22/2022)   Exercise Vital Sign    Days of Exercise per Week: 4 days    Minutes of Exercise per Session: 10 min  Stress: No Stress Concern Present (08/22/2022)   [employer] of Occupational Health - Occupational Stress Questionnaire    Feeling of Stress : Not at all  Social Connections: Moderately Isolated (08/22/2022)   Social Connection and Isolation Panel [NHANES]    Frequency of Communication with Friends and Family: More than three times a week    Frequency of Social Gatherings with Friends and Family: More than three times a week    Attends Religious Services: 1 to 4 times per year    Active Member of [employer] or Organizations: No    Attends Banker Meetings: Never    Marital Status: Widowed  Intimate Partner Violence: Not At Risk (10/05/2022)   Humiliation, Afraid, Rape, and  Kick questionnaire    Fear of Current or Ex-Partner: No    Emotionally Abused: No    Physically Abused: No    Sexually Abused: No     Code Status   Code Status: Full Code  Review of Systems: All systems reviewed and negative except where noted in HPI.  Physical Exam: Vital signs in last 24 hours: Temp:  [97.8 F (36.6 C)-98.1 F (36.7 C)] 98 F (36.7 C) (08/16 0550) Pulse Rate:  [87-94] 87 (08/16 0550) Resp:  [16-18] 18 (08/16 0550) BP: (132-141)/(61-74) 141/69 (08/16 0550) SpO2:  [96 %-98 %] 97 % (08/16 0550) Weight:  [63.4 kg] 63.4 kg (08/15 1400) Last BM Date : 10/02/22  General:  Pleasant female in NAD Psych:  Cooperative. Normal mood and affect Eyes: Pupils equal Ears:  Normal auditory acuity Nose: No deformity, discharge or lesions Neck:  Supple, no masses felt Lungs:  Clear to auscultation.  Heart:  Regular rate, regular rhythm.  Abdomen:  Soft, nondistended, nontender, active bowel  sounds, no masses felt Rectal :  Deferred Msk: Symmetrical without gross deformities.  Neurologic:  Alert, oriented, grossly normal neurologically Extremities : No edema Skin:  Intact without significant lesions.    Intake/Output from previous day: 08/15 0701 - 08/16 0700 In: 197.5 [I.V.:138.8; IV Piggyback:58.8] Out: -  Intake/Output this shift:  Total I/O In: 2004 [I.V.:1322; IV Piggyback:682] Out: -   Principal Problem:   Hypokalemia due to excessive gastrointestinal loss of potassium   Deanna May, NP-C @  10/06/2022, 1:55 PM

## 2022-10-07 ENCOUNTER — Encounter (HOSPITAL_COMMUNITY)
Admission: AD | Disposition: A | Discharge status: Home / Self Care | Payer: Self-pay | Source: Ambulatory Visit | Attending: Internal Medicine

## 2022-10-07 ENCOUNTER — Inpatient Hospital Stay (HOSPITAL_COMMUNITY): Payer: Medicare HMO | Admitting: Certified Registered"

## 2022-10-07 ENCOUNTER — Inpatient Hospital Stay: Payer: Medicare HMO

## 2022-10-07 DIAGNOSIS — J449 Chronic obstructive pulmonary disease, unspecified: Secondary | ICD-10-CM | POA: Diagnosis not present

## 2022-10-07 DIAGNOSIS — K449 Diaphragmatic hernia without obstruction or gangrene: Secondary | ICD-10-CM | POA: Diagnosis not present

## 2022-10-07 DIAGNOSIS — Z87891 Personal history of nicotine dependence: Secondary | ICD-10-CM | POA: Diagnosis not present

## 2022-10-07 DIAGNOSIS — E876 Hypokalemia: Secondary | ICD-10-CM | POA: Diagnosis not present

## 2022-10-07 DIAGNOSIS — K317 Polyp of stomach and duodenum: Secondary | ICD-10-CM

## 2022-10-07 DIAGNOSIS — K209 Esophagitis, unspecified without bleeding: Secondary | ICD-10-CM

## 2022-10-07 HISTORY — PX: ESOPHAGOGASTRODUODENOSCOPY (EGD) WITH PROPOFOL: SHX5813

## 2022-10-07 HISTORY — PX: BIOPSY: SHX5522

## 2022-10-07 LAB — CBC
HCT: 40.3 % (ref 36.0–46.0)
Hemoglobin: 13.1 g/dL (ref 12.0–15.0)
MCH: 28 pg (ref 26.0–34.0)
MCHC: 32.5 g/dL (ref 30.0–36.0)
MCV: 86.1 fL (ref 80.0–100.0)
Platelets: 236 10*3/uL (ref 150–400)
RBC: 4.68 MIL/uL (ref 3.87–5.11)
RDW: 22.8 % — ABNORMAL HIGH (ref 11.5–15.5)
WBC: 4.1 10*3/uL (ref 4.0–10.5)
nRBC: 0 % (ref 0.0–0.2)

## 2022-10-07 LAB — COMPREHENSIVE METABOLIC PANEL
ALT: 33 U/L (ref 0–44)
AST: 38 U/L (ref 15–41)
Albumin: 2.9 g/dL — ABNORMAL LOW (ref 3.5–5.0)
Alkaline Phosphatase: 57 U/L (ref 38–126)
Anion gap: 15 (ref 5–15)
BUN: <5 mg/dL — ABNORMAL LOW (ref 8–23)
CO2: 14 mmol/L — ABNORMAL LOW (ref 22–32)
Calcium: 8.5 mg/dL — ABNORMAL LOW (ref 8.9–10.3)
Chloride: 103 mmol/L (ref 98–111)
Creatinine, Ser: 0.65 mg/dL (ref 0.44–1.00)
GFR, Estimated: >60 mL/min (ref >60)
Glucose, Bld: 75 mg/dL (ref 70–99)
Potassium: 3.5 mmol/L (ref 3.5–5.1)
Sodium: 132 mmol/L — ABNORMAL LOW (ref 135–145)
Total Bilirubin: 0.8 mg/dL (ref 0.3–1.2)
Total Protein: 6.1 g/dL — ABNORMAL LOW (ref 6.5–8.1)

## 2022-10-07 SURGERY — ESOPHAGOGASTRODUODENOSCOPY (EGD) WITH PROPOFOL
Anesthesia: Monitor Anesthesia Care

## 2022-10-07 MED ORDER — PROPOFOL 1000 MG/100ML IV EMUL
INTRAVENOUS | Status: AC
  Filled 2022-10-07: qty 100

## 2022-10-07 MED ORDER — LACTATED RINGERS IV SOLN
INTRAVENOUS | Status: AC | PRN
Start: 2022-10-07 — End: 2022-10-07
  Administered 2022-10-07: 10 mL/h via INTRAVENOUS

## 2022-10-07 MED ORDER — PROPOFOL 10 MG/ML IV BOLUS
INTRAVENOUS | Status: DC | PRN
Start: 2022-10-07 — End: 2022-10-07
  Administered 2022-10-07: 40 mg via INTRAVENOUS
  Administered 2022-10-07: 80 mg via INTRAVENOUS
  Administered 2022-10-07: 40 mg via INTRAVENOUS

## 2022-10-07 MED ORDER — PROPOFOL 500 MG/50ML IV EMUL
INTRAVENOUS | Status: DC | PRN
  Administered 2022-10-07: 75 ug/kg/min via INTRAVENOUS

## 2022-10-07 MED ORDER — LIDOCAINE HCL (CARDIAC) PF 100 MG/5ML IV SOSY
PREFILLED_SYRINGE | INTRAVENOUS | Status: DC | PRN
Start: 2022-10-07 — End: 2022-10-07
  Administered 2022-10-07: 60 mg via INTRAVENOUS
  Administered 2022-10-07: 40 mg via INTRAVENOUS

## 2022-10-07 MED ORDER — LIDOCAINE VISCOUS HCL 2 % MT SOLN
15.0000 mL | OROMUCOSAL | Status: DC | PRN
  Administered 2022-10-07: 15 mL via OROMUCOSAL
  Filled 2022-10-07: qty 15

## 2022-10-07 SURGICAL SUPPLY — 15 items

## 2022-10-07 NOTE — Anesthesia Preprocedure Evaluation (Addendum)
Anesthesia Evaluation  Patient identified by MRN, date of birth, ID band Patient awake    Reviewed: Allergy & Precautions, H&P , NPO status , Patient's Chart, lab work & pertinent test results  History of Anesthesia Complications (+) PONV and history of anesthetic complications  Airway Mallampati: II  TM Distance: >3 FB Neck ROM: Full    Dental  (+) Missing, Dental Advisory Given,    Pulmonary COPD,  COPD inhaler, former smoker non-small cell lung cancer status post final radiation 09/25/2022 and ongoing chemotherapy   Pulmonary exam normal breath sounds clear to auscultation       Cardiovascular + Peripheral Vascular Disease  Normal cardiovascular exam+ Valvular Problems/Murmurs (mild MR) MR  Rhythm:Regular Rate:Normal  Echo 2022   1. Left ventricular ejection fraction, by estimation, is 60 to 65%. The  left ventricle has normal function. The left ventricle has no regional  wall motion abnormalities. Left ventricular diastolic parameters are  consistent with Grade I diastolic  dysfunction (impaired relaxation).   2. Right ventricular systolic function is normal. The right ventricular  size is normal. There is normal pulmonary artery systolic pressure.   3. The mitral valve is normal in structure. Mild mitral valve  regurgitation. No evidence of mitral stenosis.   4. The aortic valve is normal in structure. Aortic valve regurgitation is  not visualized. No aortic stenosis is present.   5. The inferior vena cava is normal in size with greater than 50%  respiratory variability, suggesting right atrial pressure of 3 mmHg.     Neuro/Psych negative neurological ROS  negative psych ROS   GI/Hepatic Neg liver ROS, hiatal hernia,GERD  Controlled,,Intractable nausea and vomiting with epigastric pain- no findings on CT or AXR   Endo/Other  negative endocrine ROS    Renal/GU negative Renal ROS  negative genitourinary    Musculoskeletal  (+) Arthritis , Osteoarthritis,    Abdominal   Peds negative pediatric ROS (+)  Hematology  (+) Blood dyscrasia, anemia Hb 11.2, plt 205   Anesthesia Other Findings   Reproductive/Obstetrics negative OB ROS                             Anesthesia Physical Anesthesia Plan  ASA: 3  Anesthesia Plan: MAC   Post-op Pain Management:    Induction:   PONV Risk Score and Plan: 2 and Propofol infusion and TIVA  Airway Management Planned: Natural Airway and Simple Face Mask  Additional Equipment: None  Intra-op Plan:   Post-operative Plan:   Informed Consent: I have reviewed the patients History and Physical, chart, labs and discussed the procedure including the risks, benefits and alternatives for the proposed anesthesia with the patient or authorized representative who has indicated his/her understanding and acceptance.       Plan Discussed with: CRNA  Anesthesia Plan Comments:        Anesthesia Quick Evaluation

## 2022-10-07 NOTE — Progress Notes (Signed)
PROGRESS NOTE    [name]  XBM:[id number] DOB: October 23, 1950 DOA: 10/05/2022 PCP: [name], PA-C    Brief Narrative:  72 y.o. female with medical history significant for GERD, hiatal hernia, hyperlipidemia, non-small cell lung cancer status post final radiation 09/25/2022 and ongoing chemotherapy with carboplatin and paclitaxel under the care of Dr. Arbutus Ped who is being admitted to the hospital with intractable nausea vomiting, and associated hypokalemia.  Patient has had multiple recent follow-ups with  cancer Center symptom management clinic due to continued intractable vomiting with any oral intake including liquids, as well as resulting hypokalemia.  She is not having any dysphagia, but she has a burning epigastric discomfort with any oral intake even thin liquids.  Usually about 30 minutes after p.o. intake, she vomits.  She has been constipated, but having some bowel movements with suppository.  She notes that each time she goes to the cancer clinic and is given some IV Pepcid, seems to do better for about 24 hours.  She was recently prescribed some liquid sucralfate, this seemed to slightly improve her symptoms.  She denies any chest pain, hematemesis, fevers, chills, blood in her stool.  No significant abdominal pain other than the burning epigastric discomfort after oral intake.  Today in the oncology clinic she was found to have recurrent hypokalemia, was given IV fluids, IV potassium and directly admitted to [facility].    Assessment & Plan:   Principal Problem:   Hypokalemia due to excessive gastrointestinal loss of potassium Active Problems:   Intractable nausea and vomiting   #1 Intractable nausea and vomiting-gastritis versus  esophagitis versus severe GERD versus radiation induced treated symptomatically with IV fluids PPI Zofran and Phenergan. EGD 10/07/2022-moderate proximal and mid esophagitis with no bleeding.  Biopsies done.  3 cm hiatal hernia.  Multiple gastric  polyps biopsied.  Normal duodenum biopsied.  Recommending full liquid diet, viscous lidocaine and GI cocktail and Protonix and Phenergan.   #2 hypokalemia being repleted aggressively.  Recheck labs in AM.  Check mag levels.  #3 non-small cell lung CA status post radiation 09/25/2022 with ongoing chemotherapy Dr. Rhina Brackett is requesting a porta cath due to poor iv access  Miscellaneous- all po meds on hold  Estimated body mass index is 20.67 kg/m as calculated from the following:   Height as of this encounter: 5\' 7"  (1.702 m).   Weight as of this encounter: 59.9 kg.  DVT prophylaxis: lovenox Code Status: full Family Communication: none Disposition Plan:  Status is: inpatient  The patient will require care spanning > 2 midnights and should be moved to inpatient because: intractable nausea vomiting   Consultants:  Onc gi  Procedures:none Antimicrobials: none  Subjective: I saw her before the endoscopy she looked better than yesterday however Has been n.p.o. for the procedure.  objective: Vitals:   10/07/22 0927 10/07/22 0930 10/07/22 0939 10/07/22 0946  BP: (!) 133/57  (!) 133/93 (!) 150/79  Pulse: (!) 104 99 (!) 110 99  Resp: (!) 32 (!) 24 (!) 21 (!) 22  Temp: (!) 97.1 F (36.2 C)     TempSrc: Temporal     SpO2: 100% 100% 100% 99%  Weight:      Height:        Intake/Output Summary (Last 24 hours) at 10/07/2022 1312 Last data filed at 10/07/2022 1143 Gross per 24 hour  Intake 1039.83 ml  Output --  Net 1039.83 ml   Filed Weights   10/05/22 1400 10/07/22 0811  Weight: 63.4 kg 59.9  kg    Examination:  General exam: Appears ill  respiratory system: Clear to auscultation. Respiratory effort normal. Cardiovascular system: S1 & S2 heard, RRR. No JVD, murmurs, rubs, gallops or clicks. No pedal edema. Gastrointestinal system: Abdomen is nondistended, soft and nontender. No organomegaly or masses felt. Normal bowel sounds heard. Central nervous system: Alert and  oriented. No focal neurological deficits. Extremities: No edema   Data Reviewed: I have personally reviewed following labs and imaging studies  CBC: Recent Labs  Lab 10/03/22 0903 10/05/22 0851 10/06/22 0537 10/07/22 1050  WBC 4.7 6.0 3.1* 4.1  NEUTROABS 2.8 4.4  --   --   HGB 13.5 13.7 11.2* 13.1  HCT 40.3 40.6 35.3* 40.3  MCV 82.8 84.2 87.2 86.1  PLT 291 255 205 236   Basic Metabolic Panel: Recent Labs  Lab 10/03/22 0903 10/05/22 0851 10/05/22 1334 10/06/22 0537 10/07/22 1050  NA 137 136 137 131* 132*  K 2.8* 2.8* 3.0* 3.3* 3.5  CL 100 99 104 102 103  CO2 21* 20* 17* 14* 14*  GLUCOSE 107* 86 74 70 75  BUN 11 7* 6* <5* <5*  CREATININE 0.67 0.57 0.53 0.64 0.65  CALCIUM 9.4 8.8* 8.3* 8.1* 8.5*  MG 1.6* 1.5* 1.7 1.9  --    GFR: Estimated Creatinine Clearance: 59.2 mL/min (by C-G formula based on SCr of 0.65 mg/dL). Liver Function Tests: Recent Labs  Lab 10/03/22 0903 10/05/22 0851 10/07/22 1050  AST 27 33 38  ALT 28 29 33  ALKPHOS 66 70 57  BILITOT 0.5 0.5 0.8  PROT 6.5 6.9 6.1*  ALBUMIN 3.5 3.7 2.9*   No results for input(s): "LIPASE", "AMYLASE" in the last 168 hours. No results for input(s): "AMMONIA" in the last 168 hours. Coagulation Profile: No results for input(s): "INR", "PROTIME" in the last 168 hours. Cardiac Enzymes: No results for input(s): "CKTOTAL", "CKMB", "CKMBINDEX", "TROPONINI" in the last 168 hours. BNP (last 3 results) No results for input(s): "PROBNP" in the last 8760 hours. HbA1C: No results for input(s): "HGBA1C" in the last 72 hours. CBG: No results for input(s): "GLUCAP" in the last 168 hours. Lipid Profile: No results for input(s): "CHOL", "HDL", "LDLCALC", "TRIG", "CHOLHDL", "LDLDIRECT" in the last 72 hours. Thyroid Function Tests: No results for input(s): "TSH", "T4TOTAL", "FREET4", "T3FREE", "THYROIDAB" in the last 72 hours. Anemia Panel: No results for input(s): "VITAMINB12", "FOLATE", "FERRITIN", "TIBC", "IRON",  "RETICCTPCT" in the last 72 hours. Sepsis Labs: No results for input(s): "PROCALCITON", "LATICACIDVEN" in the last 168 hours.  No results found for this or any previous visit (from the past 240 hour(s)).       Radiology Studies: DG Abd 1 View  Result Date: 10/05/2022 CLINICAL DATA:  Intractable nausea vomiting.  Lung cancer. EXAM: ABDOMEN - 1 VIEW COMPARISON:  CT abdomen pelvis dated 09/10/2022. FINDINGS: No bowel dilatation or evidence of obstruction. No free air. A pessary is noted in the pelvis. Degenerative changes of the spine. No acute osseous pathology. IMPRESSION: Nonobstructive bowel gas pattern. Electronically Signed   By: Elgie Collard M.D.   On: 10/05/2022 17:41        Scheduled Meds:  feeding supplement  237 mL Oral TID BM   pantoprazole (PROTONIX) IV  40 mg Intravenous Q12H   sucralfate  1 g Oral TID WC & HS   Continuous Infusions:  sodium chloride 75 mL/hr at 10/07/22 0539   promethazine (PHENERGAN) injection (IM or IVPB) 12.5 mg (10/07/22 1044)     LOS: 1 day  Time spent: 39 minutes  Alwyn Ren, MD 10/07/2022, 1:12 PM

## 2022-10-07 NOTE — Transfer of Care (Signed)
Immediate Anesthesia Transfer of Care Note  Patient: [name]  Procedure(s) Performed: ESOPHAGOGASTRODUODENOSCOPY (EGD) WITH PROPOFOL BIOPSY  Patient Location: PACU  Anesthesia Type:MAC  Level of Consciousness: drowsy and patient cooperative  Airway & Oxygen Therapy: Patient Spontanous Breathing and Patient connected to face mask oxygen  Post-op Assessment: Report given to RN and Post -op Vital signs reviewed and stable  Post vital signs: Reviewed and stable  Last Vitals:  Vitals Value Taken Time  BP 133/57 10/07/22 0927  Temp    Pulse 105 10/07/22 0927  Resp 28 10/07/22 0927  SpO2 100 % 10/07/22 0927  Vitals shown include unfiled device data.  Last Pain:  Vitals:   10/07/22 0811  TempSrc: Temporal  PainSc: 0-No pain      Patients Stated Pain Goal: 0 (10/05/22 1245)  Complications: No notable events documented.

## 2022-10-07 NOTE — Interval H&P Note (Signed)
History and Physical Interval Note:  10/07/2022 8:59 AM  [name]  has presented today for surgery, with the diagnosis of upper abdominal pain , nausea and vomiting and acid reflux.  The various methods of treatment have been discussed with the patient and family. After consideration of risks, benefits and other options for treatment, the patient has consented to  Procedure(s): ESOPHAGOGASTRODUODENOSCOPY (EGD) WITH PROPOFOL (N/A) as a surgical intervention.  The patient's history has been reviewed, patient examined, no change in status, stable for surgery.  I have reviewed the patient's chart and labs.  Questions were answered to the patient's satisfaction.     Lynann Bologna

## 2022-10-07 NOTE — Op Note (Addendum)
[facility] Patient Name: [name] Procedure Date: 10/07/2022 MRN: [id number] Attending MD: Lynann Bologna , MD, [id number] Date of Birth: [birth year] CSN: [id number] Age: 72 Admit Type: Inpatient Procedure:                Upper GI endoscopy Indications:              Persistent vomiting of unknown cause Providers:                Lynann Bologna, MD, Doristine Mango, RN, Rozetta Nunnery, Technician Referring MD:             Caroleen Hamman Walisiewicz Medicines:                Monitored Anesthesia Care Complications:            No immediate complications. Estimated Blood Loss:     Estimated blood loss: none. Procedure:                Pre-Anesthesia Assessment:                           - Prior to the procedure, a History and Physical                            was performed, and patient medications and                            allergies were reviewed. The patient's tolerance of                            previous anesthesia was also reviewed. The risks                            and benefits of the procedure and the sedation                            options and risks were discussed with the patient.                            All questions were answered, and informed consent                            was obtained. Prior Anticoagulants: The patient has                            taken no anticoagulant or antiplatelet agents. ASA                            Grade Assessment: III - A patient with severe                            systemic disease. After reviewing the risks and  benefits, the patient was deemed in satisfactory                            condition to undergo the procedure.                           After obtaining informed consent, the endoscope was                            passed under direct vision. Throughout the                            procedure, the patient's blood pressure, pulse, and                             oxygen saturations were monitored continuously. The                            [id number]-[id number] ([id number] Olympus endoscope was introduced                            through the mouth, and advanced to the second part                            of duodenum. The upper GI endoscopy was                            accomplished without difficulty. The patient                            tolerated the procedure well. Scope In: Scope Out: Findings:      Moderately severe esophagitis with no bleeding was found 20 to 30 cm       from the incisors in proximal and midesophagus. It was circumferential       with suggestion of early stricture. Few Biopsies were taken with a cold       forceps for histology.      The Z-line was regular and was found 35 cm from the incisors.      A 3 cm hiatal hernia was present.      Multiple 4 to 6 mm sessile polyps with no bleeding and no stigmata of       recent bleeding were found in the gastric fundus and in the gastric       body. Typical for fundic gland polyps. Multiple biopsies were taken with       a cold forceps for histology. The gastric mucosa otherwise was       unremarkable. Multiple biopsies were obtained from the antrum to rule       out H. pylori.      The examined duodenum was normal. Biopsies for histology were taken with       a cold forceps for evaluation of celiac disease. Impression:               - Mod proximal/mid esophagitis with no bleeding                            (  likely radiation induced). Biopsied.                           - Z-line regular, 35 cm from the incisors.                           - 3 cm hiatal hernia.                           - Multiple gastric polyps. Biopsied.                           - Normal examined duodenum. Biopsied. Moderate Sedation:      Not Applicable - Patient had care per Anesthesia. Recommendation:           - N/V is due to chemo/XRT. No obvious GI etiology.                           - Full liq diet.  Advance as tolerated                           - Please use viscous lidocaine or GI cocktail 4                            times daily as needed                           - Continue present medications including Protonix                            40 mg p.o. twice daily.                           - If she tolerates, use Carafate elixir 1 g p.o. 4                            times daily x 2 weeks                           - Would need around-the-clock antiemetics. She told                            me that Phenergan works well. Recommend Phenergan                            25 mg PO or PR QID until chemotherapy is done.                            Zofran did not work very well. Can use Compazine                            p.o. or suppositories if needed.                           - Await  pathology results.                           - The findings and recommendations were discussed                            with the patient's family. Procedure Code(s):        --- Professional ---                           [phone], Esophagogastroduodenoscopy, flexible,                            transoral; with biopsy, single or multiple Diagnosis Code(s):        --- Professional ---                           K20.90, Esophagitis, unspecified without bleeding                           K44.9, Diaphragmatic hernia without obstruction or                            gangrene                           K31.7, Polyp of stomach and duodenum                           R11.15, Cyclical vomiting syndrome unrelated to                            migraine CPT copyright 2022 [employer]. All rights reserved. The codes documented in this report are preliminary and upon coder review may  be revised to meet current compliance requirements. Lynann Bologna, MD 10/07/2022 9:33:40 AM This report has been signed electronically. Number of Addenda: 0

## 2022-10-07 NOTE — Consult Note (Signed)
Chief Complaint: Patient was seen in consultation today for port placement   Referring Physician(s): Walisiewicz,Kaitlyn E  Supervising Physician: Roanna Banning  Patient Status: [facility] - In-[facility]  History of Present Illness: [name] is a 72 y.o. female with lung cancer. Currently admitted with N/V and hypokalemia. She has completed radiation and chemotherapy but will continue receiving immunotherapy infusions. She has developed worsening venous access issues and IR is asked to place port.  Had EGD earlier this am but is back in room, awake and alert and oriented. Granddaughter also at bedside.  PMHx, meds, labs, imaging, allergies reviewed. Feels well, no recent fevers, chills, illness. Family at bedside.   Past Medical History:  Diagnosis Date   Abnormal chest x-ray 01/19/2020   Acute laryngopharyngitis 03/18/2020   Aortic atherosclerosis (HCC) 01/28/2020   Arthritis    Cardiac murmur 03/01/2020   COPD (chronic obstructive pulmonary disease) (HCC)    Coronary artery calcification seen on CT scan 03/01/2020   Cough 01/12/2020   COVID-19 03/10/2020   Dyspnea    on exertion,after getting covid   Family history of adverse reaction to anesthesia    brothr had n/v   GERD (gastroesophageal reflux disease)    Hiatal hernia    Hyperlipemia    Lung cancer, middle lobe (HCC)    middle lobe removed   Pneumonia    PONV (postoperative nausea and vomiting)    Pre-operative cardiovascular examination 03/01/2020   Right lower lobe lung mass 01/28/2020   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   Visit for screening mammogram 01/28/2020    Past Surgical History:  Procedure Laterality Date   CATARACT EXTRACTION Bilateral 2023   LUNG REMOVAL, PARTIAL  03/29/2020   middle lobe removed   VIDEO BRONCHOSCOPY WITH ENDOBRONCHIAL ULTRASOUND N/A 07/20/2022   Procedure: VIDEO BRONCHOSCOPY WITH ENDOBRONCHIAL ULTRASOUND;  Surgeon: [name], MD;  Location: [facility] OR;   Service: Thoracic;  Laterality: N/A;    Allergies: Codeine and Surgical lubricant  Medications:  Current Facility-Administered Medications:    0.9 %  sodium chloride infusion, , Intravenous, Continuous, Kirby Crigler, Mir M, MD, Last Rate: 75 mL/hr at 10/07/22 0539, Infusion Verify at 10/07/22 0539   acetaminophen (TYLENOL) tablet 650 mg, 650 mg, Oral, Q6H PRN **OR** acetaminophen (TYLENOL) suppository 650 mg, 650 mg, Rectal, Q6H PRN, Kirby Crigler, Mir M, MD   albuterol (PROVENTIL) (2.5 MG/3ML) 0.083% nebulizer solution 2.5 mg, 2.5 mg, Nebulization, Q2H PRN, Kirby Crigler, Mir M, MD   alum & mag hydroxide-simeth (MAALOX/MYLANTA) 200-200-20 MG/5ML suspension 15 mL, 15 mL, Oral, Q6H PRN, Kirby Crigler, Mir M, MD   cyclobenzaprine (FLEXERIL) tablet 5 mg, 5 mg, Oral, TID PRN, Kirby Crigler, Mir M, MD   feeding supplement (ENSURE ENLIVE / ENSURE PLUS) liquid 237 mL, 237 mL, Oral, TID BM, Kirby Crigler, Mir M, MD   fluticasone furoate-vilanterol (BREO ELLIPTA) 100-25 MCG/ACT 1 puff, 1 puff, Inhalation, Daily PRN **AND** umeclidinium bromide (INCRUSE ELLIPTA) 62.5 MCG/ACT 1 puff, 1 puff, Inhalation, Daily PRN, Kirby Crigler, Mir M, MD   HYDROcodone-acetaminophen (HYCET) 7.5-325 mg/15 ml solution 5-10 mL, 5-10 mL, Oral, Q4H PRN, Kirby Crigler, Mir M, MD   lidocaine (XYLOCAINE) 2 % viscous mouth solution 15 mL, 15 mL, Mouth/Throat, Q4H PRN, Lynann Bologna, MD, 15 mL at 10/07/22 1045   metoCLOPramide (REGLAN) injection 5 mg, 5 mg, Intravenous, Q6H PRN, Alwyn Ren, MD, 5 mg at 10/07/22 0158   ondansetron (ZOFRAN) tablet 4 mg, 4 mg, Oral, Q6H PRN **OR** ondansetron (ZOFRAN) injection 4 mg, 4 mg, Intravenous, Q6H PRN, Kirby Crigler,  Mir M, MD, 4 mg at 10/07/22 0701   Oral care mouth rinse, 15 mL, Mouth Rinse, PRN, Alwyn Ren, MD   pantoprazole (PROTONIX) injection 40 mg, 40 mg, Intravenous, Q12H, Meredith Pel, NP, 40 mg at 10/07/22 1042   promethazine (PHENERGAN) 12.5 mg in sodium chloride 0.9 % 50 mL IVPB,  12.5 mg, Intravenous, Q6H PRN, Alwyn Ren, MD, Last Rate: 200 mL/hr at 10/07/22 1044, 12.5 mg at 10/07/22 1044   sucralfate (CARAFATE) 1 GM/10ML suspension 1 g, 1 g, Oral, TID WC & HS, Kirby Crigler, Mir M, MD, 1 g at 10/05/22 1555   traZODone (DESYREL) tablet 25 mg, 25 mg, Oral, QHS PRN, Kirby Crigler, Mir M, MD    Family History  Problem Relation Age of Onset   Heart disease Mother    Heart attack Mother    Cancer Father    Diabetes Father    Non-Hodgkin's lymphoma Brother    High Cholesterol Brother    High Cholesterol Brother    Heart disease Brother    Heart attack Brother     Social History   Socioeconomic History   Marital status: Widowed    Spouse name: Not on file   Number of children: 1   Years of education: Not on file   Highest education level: GED or equivalent  Occupational History   Not on file  Tobacco Use   Smoking status: Former    Current packs/day: 0.00    Types: Cigarettes    Start date: 03/23/1967    Quit date: 03/22/2017    Years since quitting: 5.5   Smokeless tobacco: Never  Vaping Use   Vaping status: Never Used  Substance and Sexual Activity   Alcohol use: Never   Drug use: Never   Sexual activity: Not Currently  Other Topics Concern   Not on file  Social History Narrative   Not on file   Social Determinants of Health   Financial Resource Strain: Medium Risk (08/22/2022)   Overall Financial Resource Strain (CARDIA)    Difficulty of Paying Living Expenses: Somewhat hard  Food Insecurity: No Food Insecurity (10/05/2022)   Hunger Vital Sign    Worried About Running Out of Food in the Last Year: Never true    Ran Out of Food in the Last Year: Never true  Transportation Needs: No Transportation Needs (10/05/2022)   PRAPARE - Administrator, Civil Service (Medical): No    Lack of Transportation (Non-Medical): No  Physical Activity: Insufficiently Active (08/22/2022)   Exercise Vital Sign    Days of Exercise per Week: 4 days     Minutes of Exercise per Session: 10 min  Stress: No Stress Concern Present (08/22/2022)   [employer] of Occupational Health - Occupational Stress Questionnaire    Feeling of Stress : Not at all  Social Connections: Moderately Isolated (08/22/2022)   Social Connection and Isolation Panel [NHANES]    Frequency of Communication with Friends and Family: More than three times a week    Frequency of Social Gatherings with Friends and Family: More than three times a week    Attends Religious Services: 1 to 4 times per year    Active Member of [employer] or Organizations: No    Attends Banker Meetings: Never    Marital Status: Widowed    Review of Systems: A 12 point ROS discussed and pertinent positives are indicated in the HPI above.  All other systems are negative.  Review of Systems  Vital Signs: BP (!) 150/79   Pulse 99   Temp (!) 97.1 F (36.2 C) (Temporal)   Resp (!) 22   Ht 5\' 7"  (1.702 m)   Wt 132 lb (59.9 kg)   SpO2 99%   BMI 20.67 kg/m   Physical Exam Constitutional:      Appearance: She is not ill-appearing.  HENT:     Mouth/Throat:     Mouth: Mucous membranes are moist.     Pharynx: Oropharynx is clear.  Cardiovascular:     Rate and Rhythm: Normal rate and regular rhythm.     Heart sounds: Normal heart sounds.  Pulmonary:     Effort: Pulmonary effort is normal. No respiratory distress.     Breath sounds: Normal breath sounds.  Neurological:     General: No focal deficit present.     Mental Status: She is alert and oriented to person, place, and time.  Psychiatric:        Mood and Affect: Mood normal.        Thought Content: Thought content normal.     Imaging: DG Abd 1 View  Result Date: 10/05/2022 CLINICAL DATA:  Intractable nausea vomiting.  Lung cancer. EXAM: ABDOMEN - 1 VIEW COMPARISON:  CT abdomen pelvis dated 09/10/2022. FINDINGS: No bowel dilatation or evidence of obstruction. No free air. A pessary is noted in the pelvis. Degenerative  changes of the spine. No acute osseous pathology. IMPRESSION: Nonobstructive bowel gas pattern. Electronically Signed   By: Elgie Collard M.D.   On: 10/05/2022 17:41   CT ABDOMEN PELVIS W CONTRAST  Result Date: 09/10/2022 CLINICAL DATA:  Right upper quadrant pain for 2-3 days, breast cancer, ongoing chemotherapy * Tracking Code: BO * EXAM: CT ABDOMEN AND PELVIS WITH CONTRAST TECHNIQUE: Multidetector CT imaging of the abdomen and pelvis was performed using the standard protocol following bolus administration of intravenous contrast. RADIATION DOSE REDUCTION: This exam was performed according to the departmental dose-optimization program which includes automated exposure control, adjustment of the mA and/or kV according to patient size and/or use of iterative reconstruction technique. CONTRAST:  OMNIPAQUE IOHEXOL 300 MG/ML  SOLN COMPARISON:  PET-CT, 07/03/2022 FINDINGS: Lower chest: No acute abnormality. Hepatobiliary: No solid liver abnormality is seen. Hepatic steatosis. No gallstones, gallbladder wall thickening, or biliary dilatation. Pancreas: Unremarkable. No pancreatic ductal dilatation or surrounding inflammatory changes. Spleen: Normal in size without significant abnormality. Adrenals/Urinary Tract: Adrenal glands are unremarkable. Kidneys are normal, without renal calculi, solid lesion, or hydronephrosis. Bladder is unremarkable. Stomach/Bowel: Stomach is within normal limits. Appendix appears normal. No evidence of bowel wall thickening, distention, or inflammatory changes. Vascular/Lymphatic: Aortic atherosclerosis. No enlarged abdominal or pelvic lymph nodes. Reproductive: No mass or other significant abnormality. Pessary in the vagina. Other: No abdominal wall hernia or abnormality. No ascites. Musculoskeletal: No acute or significant osseous findings. IMPRESSION: 1. No acute CT findings of the abdomen or pelvis to explain right upper quadrant pain. 2. Hepatic steatosis. 3. No evidence of  lymphadenopathy or metastatic disease in the abdomen or pelvis. Aortic Atherosclerosis (ICD10-I70.0). Electronically Signed   By: Jearld Lesch M.D.   On: 09/10/2022 17:38   DG Chest Port 1 View  Result Date: 09/10/2022 CLINICAL DATA:  Left upper quadrant pain and chest pain. History of lung cancer with radiation therapy. EXAM: PORTABLE CHEST 1 VIEW COMPARISON:  07/18/22 FINDINGS: Heart size is normal. Postoperative changes from right middle lobectomy. No signs of pleural effusion, interstitial edema, or airspace disease. 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  Visualized osseous structures are unremarkable. IMPRESSION: 1. No acute cardiopulmonary disease. 2. Status post right middle lobectomy. 3. COPD/emphysema. Electronically Signed   By: Signa Kell M.D.   On: 09/10/2022 13:54    Labs:  CBC: Recent Labs    09/25/22 0801 10/03/22 0903 10/05/22 0851 10/06/22 0537  WBC 2.9* 4.7 6.0 3.1*  HGB 12.8 13.5 13.7 11.2*  HCT 38.0 40.3 40.6 35.3*  PLT 364 291 255 205    COAGS: Recent Labs    07/18/22 1500  INR 0.9  APTT 29    BMP: Recent Labs    10/03/22 0903 10/05/22 0851 10/05/22 1334 10/06/22 0537  NA 137 136 137 131*  K 2.8* 2.8* 3.0* 3.3*  CL 100 99 104 102  CO2 21* 20* 17* 14*  GLUCOSE 107* 86 74 70  BUN 11 7* 6* <5*  CALCIUM 9.4 8.8* 8.3* 8.1*  CREATININE 0.67 0.57 0.53 0.64  GFRNONAA >60 >60 >60 >60    LIVER FUNCTION TESTS: Recent Labs    09/18/22 1329 09/25/22 0801 10/03/22 0903 10/05/22 0851  BILITOT 0.5 0.5 0.5 0.5  AST 37 50* 27 33  ALT 45* 57* 28 29  ALKPHOS 60 61 66 70  PROT 6.5 6.5 6.5 6.9  ALBUMIN 3.8 3.6 3.5 3.7     Assessment and Plan: Lung cancer Poor venous access with need for ongoing treatments. Plan for port placement Tentatively plan for Mon 8/19, schedule permitting. Risks and benefits of image guided port-a-catheter placement was discussed with the patient including, but not limited to bleeding,  infection, pneumothorax, or fibrin sheath development and need for additional procedures.  All of the patient's questions were answered, patient is agreeable to proceed. Consent signed and in chart.    Electronically Signed: Brayton El, PA-C 10/07/2022, 11:06 AM   I spent a total of 20 minutes in face to face in clinical consultation, greater than 50% of which was counseling/coordinating care for port

## 2022-10-07 NOTE — Anesthesia Postprocedure Evaluation (Signed)
Anesthesia Post Note  Patient: Luccia Langhorne  Procedure(s) Performed: ESOPHAGOGASTRODUODENOSCOPY (EGD) WITH PROPOFOL BIOPSY     Patient location during evaluation: PACU Anesthesia Type: MAC Level of consciousness: awake and alert Pain management: pain level controlled Vital Signs Assessment: post-procedure vital signs reviewed and stable Respiratory status: spontaneous breathing, nonlabored ventilation and respiratory function stable Cardiovascular status: blood pressure returned to baseline and stable Postop Assessment: no apparent nausea or vomiting Anesthetic complications: no   No notable events documented.  Last Vitals:  Vitals:   10/07/22 0939 10/07/22 0946  BP: (!) 133/93 (!) 150/79  Pulse: (!) 110 99  Resp: (!) 21 (!) 22  Temp:    SpO2: 100% 99%    Last Pain:  Vitals:   10/07/22 0946  TempSrc:   PainSc: 0-No pain                 Lannie Fields

## 2022-10-07 NOTE — Plan of Care (Signed)

## 2022-10-08 DIAGNOSIS — R1013 Epigastric pain: Secondary | ICD-10-CM | POA: Diagnosis not present

## 2022-10-08 DIAGNOSIS — E876 Hypokalemia: Secondary | ICD-10-CM | POA: Diagnosis not present

## 2022-10-08 DIAGNOSIS — K219 Gastro-esophageal reflux disease without esophagitis: Secondary | ICD-10-CM | POA: Diagnosis not present

## 2022-10-08 DIAGNOSIS — R112 Nausea with vomiting, unspecified: Secondary | ICD-10-CM | POA: Diagnosis not present

## 2022-10-08 LAB — COMPREHENSIVE METABOLIC PANEL
ALT: 27 U/L (ref 0–44)
AST: 26 U/L (ref 15–41)
Albumin: 2.7 g/dL — ABNORMAL LOW (ref 3.5–5.0)
Alkaline Phosphatase: 55 U/L (ref 38–126)
Anion gap: 14 (ref 5–15)
BUN: <5 mg/dL — ABNORMAL LOW (ref 8–23)
CO2: 13 mmol/L — ABNORMAL LOW (ref 22–32)
Calcium: 8.3 mg/dL — ABNORMAL LOW (ref 8.9–10.3)
Chloride: 105 mmol/L (ref 98–111)
Creatinine, Ser: 0.65 mg/dL (ref 0.44–1.00)
GFR, Estimated: >60 mL/min (ref >60)
Glucose, Bld: 84 mg/dL (ref 70–99)
Potassium: 3 mmol/L — ABNORMAL LOW (ref 3.5–5.1)
Sodium: 132 mmol/L — ABNORMAL LOW (ref 135–145)
Total Bilirubin: 1.1 mg/dL (ref 0.3–1.2)
Total Protein: 5.5 g/dL — ABNORMAL LOW (ref 6.5–8.1)

## 2022-10-08 LAB — CBC
HCT: 37.2 % (ref 36.0–46.0)
Hemoglobin: 11.9 g/dL — ABNORMAL LOW (ref 12.0–15.0)
MCH: 27.4 pg (ref 26.0–34.0)
MCHC: 32 g/dL (ref 30.0–36.0)
MCV: 85.5 fL (ref 80.0–100.0)
Platelets: 193 10*3/uL (ref 150–400)
RBC: 4.35 MIL/uL (ref 3.87–5.11)
RDW: 22.6 % — ABNORMAL HIGH (ref 11.5–15.5)
WBC: 4.1 10*3/uL (ref 4.0–10.5)
nRBC: 0 % (ref 0.0–0.2)

## 2022-10-08 MED ORDER — POTASSIUM CHLORIDE 10 MEQ/100ML IV SOLN
10.0000 meq | INTRAVENOUS | Status: AC
  Administered 2022-10-08 – 2022-10-09 (×6): 10 meq via INTRAVENOUS
  Filled 2022-10-08 (×5): qty 100

## 2022-10-08 MED ORDER — LORAZEPAM 2 MG/ML IJ SOLN
0.5000 mg | Freq: Four times a day (QID) | INTRAMUSCULAR | Status: DC | PRN
  Administered 2022-10-09 – 2022-10-11 (×4): 0.5 mg via INTRAVENOUS
  Filled 2022-10-08 (×5): qty 1

## 2022-10-08 MED ORDER — LORAZEPAM 2 MG/ML PO CONC
0.5000 mg | Freq: Two times a day (BID) | ORAL | Status: DC
  Administered 2022-10-08: 0.5 mg via ORAL
  Filled 2022-10-08 (×4): qty 1

## 2022-10-08 MED ORDER — PROCHLORPERAZINE 25 MG RE SUPP
25.0000 mg | Freq: Two times a day (BID) | RECTAL | Status: DC | PRN
  Filled 2022-10-08: qty 1

## 2022-10-08 MED ORDER — MAGIC MOUTHWASH
15.0000 mL | Freq: Four times a day (QID) | ORAL | Status: DC
  Administered 2022-10-10 – 2022-10-12 (×4): 15 mL via ORAL
  Filled 2022-10-08 (×22): qty 15

## 2022-10-08 MED ORDER — PROCHLORPERAZINE 25 MG RE SUPP
25.0000 mg | Freq: Two times a day (BID) | RECTAL | Status: DC
  Administered 2022-10-08 – 2022-10-13 (×7): 25 mg via RECTAL
  Filled 2022-10-08 (×12): qty 1

## 2022-10-08 NOTE — Progress Notes (Signed)
PROGRESS NOTE    [name]  ZOX:[id number] DOB: 12/16/1950 DOA: 10/05/2022 PCP: [name], PA-C    Brief Narrative:  72 y.o. female with medical history significant for GERD, hiatal hernia, hyperlipidemia, non-small cell lung cancer status post final radiation 09/25/2022 and ongoing chemotherapy with carboplatin and paclitaxel under the care of Dr. Arbutus Ped who is being admitted to the hospital with intractable nausea vomiting, and associated hypokalemia.  Patient has had multiple recent follow-ups with [facility] symptom management clinic due to continued intractable vomiting with any oral intake including liquids, as well as resulting hypokalemia.  She is not having any dysphagia, but she has a burning epigastric discomfort with any oral intake even thin liquids.  Usually about 30 minutes after p.o. intake, she vomits.  She has been constipated, but having some bowel movements with suppository.  She notes that each time she goes to the cancer clinic and is given some IV Pepcid, seems to do better for about 24 hours.  She was recently prescribed some liquid sucralfate, this seemed to slightly improve her symptoms.  She denies any chest pain, hematemesis, fevers, chills, blood in her stool.  No significant abdominal pain other than the burning epigastric discomfort after oral intake.  Today in the oncology clinic she was found to have recurrent hypokalemia, was given IV fluids, IV potassium and directly admitted to [facility].    Assessment & Plan:   Principal Problem:   Hypokalemia due to excessive gastrointestinal loss of potassium Active Problems:   Intractable nausea and vomiting   #1 Intractable nausea and vomiting- EGD 10/07/2022-moderate proximal and mid esophagitis with no bleeding.  Biopsies done.  3 cm hiatal hernia.  Multiple gastric polyps biopsied.  Normal duodenum biopsied.   full liquid diet, viscous lidocaine and GI cocktail and Protonix and Phenergan, Ativan,  Compazine Ensure/boost 3 times daily   #2 hypokalemia being repleted aggressively.  Recheck labs in AM.  Mag 1.9  #3 non-small cell lung CA status post radiation 09/25/2022 with ongoing chemotherapy Dr. Rhina Brackett is requesting a porta cath due to poor iv access  Miscellaneous- all po meds on hold  Estimated body mass index is 20.67 kg/m as calculated from the following:   Height as of this encounter: 5\' 7"  (1.702 m).   Weight as of this encounter: 59.9 kg.  DVT prophylaxis: lovenox Code Status: full Family Communication: none Disposition Plan:  Status is: inpatient  The patient will require care spanning > 2 midnights and should be moved to inpatient because: intractable nausea vomiting   Consultants:  Onc gi  Procedures:none Antimicrobials: none  Subjective: Continues to be nauseous cannot tolerate Carafate objective: Vitals:   10/07/22 1326 10/07/22 2203 10/08/22 0603 10/08/22 1335  BP: (!) 140/78 134/77 (!) 148/90 133/81  Pulse: 90   (!) 112  Resp: 18 18 18 18   Temp: 98 F (36.7 C) 97.6 F (36.4 C) 97.7 F (36.5 C)   TempSrc:  Oral Oral   SpO2: 99% 99% 98% 100%  Weight:      Height:        Intake/Output Summary (Last 24 hours) at 10/08/2022 1530 Last data filed at 10/08/2022 1300 Gross per 24 hour  Intake 1644.05 ml  Output --  Net 1644.05 ml   Filed Weights   10/05/22 1400 10/07/22 0811  Weight: 63.4 kg 59.9 kg    Examination:  General exam: Appears ill  respiratory system: Clear to auscultation. Respiratory effort normal. Cardiovascular system: S1 & S2 heard, RRR.  No JVD, murmurs, rubs, gallops or clicks. No pedal edema. Gastrointestinal system: Abdomen is nondistended, soft and nontender. No organomegaly or masses felt. Normal bowel sounds heard. Central nervous system: Alert and oriented. No focal neurological deficits. Extremities: No edema   Data Reviewed: I have personally reviewed following labs and imaging studies  CBC: Recent Labs  Lab  10/03/22 0903 10/05/22 0851 10/06/22 0537 10/07/22 1050 10/08/22 0545  WBC 4.7 6.0 3.1* 4.1 4.1  NEUTROABS 2.8 4.4  --   --   --   HGB 13.5 13.7 11.2* 13.1 11.9*  HCT 40.3 40.6 35.3* 40.3 37.2  MCV 82.8 84.2 87.2 86.1 85.5  PLT 291 255 205 236 193   Basic Metabolic Panel: Recent Labs  Lab 10/03/22 0903 10/05/22 0851 10/05/22 1334 10/06/22 0537 10/07/22 1050 10/08/22 0545  NA 137 136 137 131* 132* 132*  K 2.8* 2.8* 3.0* 3.3* 3.5 3.0*  CL 100 99 104 102 103 105  CO2 21* 20* 17* 14* 14* 13*  GLUCOSE 107* 86 74 70 75 84  BUN 11 7* 6* <5* <5* <5*  CREATININE 0.67 0.57 0.53 0.64 0.65 0.65  CALCIUM 9.4 8.8* 8.3* 8.1* 8.5* 8.3*  MG 1.6* 1.5* 1.7 1.9  --   --    GFR: Estimated Creatinine Clearance: 59.2 mL/min (by C-G formula based on SCr of 0.65 mg/dL). Liver Function Tests: Recent Labs  Lab 10/03/22 0903 10/05/22 0851 10/07/22 1050 10/08/22 0545  AST 27 33 38 26  ALT 28 29 33 27  ALKPHOS 66 70 57 55  BILITOT 0.5 0.5 0.8 1.1  PROT 6.5 6.9 6.1* 5.5*  ALBUMIN 3.5 3.7 2.9* 2.7*   No results for input(s): "LIPASE", "AMYLASE" in the last 168 hours. No results for input(s): "AMMONIA" in the last 168 hours. Coagulation Profile: No results for input(s): "INR", "PROTIME" in the last 168 hours. Cardiac Enzymes: No results for input(s): "CKTOTAL", "CKMB", "CKMBINDEX", "TROPONINI" in the last 168 hours. BNP (last 3 results) No results for input(s): "PROBNP" in the last 8760 hours. HbA1C: No results for input(s): "HGBA1C" in the last 72 hours. CBG: No results for input(s): "GLUCAP" in the last 168 hours. Lipid Profile: No results for input(s): "CHOL", "HDL", "LDLCALC", "TRIG", "CHOLHDL", "LDLDIRECT" in the last 72 hours. Thyroid Function Tests: No results for input(s): "TSH", "T4TOTAL", "FREET4", "T3FREE", "THYROIDAB" in the last 72 hours. Anemia Panel: No results for input(s): "VITAMINB12", "FOLATE", "FERRITIN", "TIBC", "IRON", "RETICCTPCT" in the last 72 hours. Sepsis  Labs: No results for input(s): "PROCALCITON", "LATICACIDVEN" in the last 168 hours.  No results found for this or any previous visit (from the past 240 hour(s)).       Radiology Studies: No results found.      Scheduled Meds:  feeding supplement  237 mL Oral TID BM   LORazepam  0.5 mg Oral BID   magic mouthwash  15 mL Oral QID   pantoprazole (PROTONIX) IV  40 mg Intravenous Q12H   prochlorperazine  25 mg Rectal Q12H   Continuous Infusions:  sodium chloride 75 mL/hr at 10/08/22 0232   potassium chloride     promethazine (PHENERGAN) injection (IM or IVPB) 12.5 mg (10/08/22 0727)     LOS: 2 days    Time spent: 39 minutes  Alwyn Ren, MD 10/08/2022, 3:30 PM

## 2022-10-08 NOTE — Progress Notes (Signed)
     Progress Note    ASSESSMENT AND PLAN:   N/V d/t ongoing chemo (carboplatin/Taxol)/XRT Proximal/mid XRT esophagitis 3 cm HH  Plan: -IV Phenergan/Zofran -Add Compazine suppositories -Ambulate -Encourage p.o. -Trial of Ativan for anxiety -Will follow biopsies -Awaiting Port-A-Cath placement -Please reach out to Dr. Arbutus Ped for any additional recommendations Monday -D/W Dr Jerolyn Center. -Will sign off for now.     SUBJECTIVE   Feels better than yesterday Could not tolerate Carafate Still with nausea-better than before Has been tolerating Ensure.  Able to keep it down    OBJECTIVE:     Vital signs in last 24 hours: Temp:  [97.6 F (36.4 C)-98 F (36.7 C)] 97.7 F (36.5 C) (08/18 0603) Pulse Rate:  [90] 90 (08/17 1326) Resp:  [18] 18 (08/18 0603) BP: (134-148)/(77-90) 148/90 (08/18 0603) SpO2:  [98 %-99 %] 98 % (08/18 0603) Last BM Date : 10/02/22 (not able to eat anything) General:   Alert, thin builtfemale in NAD EENT:  Normal hearing, non icteric sclera, conjunctive pink.  Heart:  Regular rate and rhythm; no murmur.  No lower extremity edema   Pulm: Normal respiratory effort, lungs CTA bilaterally without wheezes or crackles. Abdomen:  Soft, nondistended, nontender.  Normal bowel sounds,.       Neurologic:  Alert and  oriented x4;  grossly normal neurologically. Psych:  Pleasant, cooperative.  Normal mood and affect.   Intake/Output from previous day: 08/17 0701 - 08/18 0700 In: 1774.1 [P.O.:120; I.V.:1554.1; IV Piggyback:100] Out: -  Intake/Output this shift: No intake/output data recorded.  Lab Results: Recent Labs    10/06/22 0537 10/07/22 1050 10/08/22 0545  WBC 3.1* 4.1 4.1  HGB 11.2* 13.1 11.9*  HCT 35.3* 40.3 37.2  PLT 205 236 193   BMET Recent Labs    10/06/22 0537 10/07/22 1050 10/08/22 0545  NA 131* 132* 132*  K 3.3* 3.5 3.0*  CL 102 103 105  CO2 14* 14* 13*  GLUCOSE 70 75 84  BUN <5* <5* <5*  CREATININE 0.64 0.65 0.65   CALCIUM 8.1* 8.5* 8.3*   LFT Recent Labs    10/08/22 0545  PROT 5.5*  ALBUMIN 2.7*  AST 26  ALT 27  ALKPHOS 55  BILITOT 1.1   PT/INR No results for input(s): "LABPROT", "INR" in the last 72 hours. Hepatitis Panel No results for input(s): "HEPBSAG", "HCVAB", "HEPAIGM", "HEPBIGM" in the last 72 hours.  No results found.   Principal Problem:   Hypokalemia due to excessive gastrointestinal loss of potassium Active Problems:   Intractable nausea and vomiting     LOS: 2 days     Edman Circle, MD 10/08/2022, 11:40 AM [facility] [phone]

## 2022-10-09 ENCOUNTER — Inpatient Hospital Stay: Payer: Medicare HMO

## 2022-10-09 ENCOUNTER — Inpatient Hospital Stay: Payer: Medicare HMO | Admitting: Physician Assistant

## 2022-10-09 ENCOUNTER — Encounter (HOSPITAL_COMMUNITY): Payer: Self-pay | Admitting: Gastroenterology

## 2022-10-09 DIAGNOSIS — E876 Hypokalemia: Secondary | ICD-10-CM | POA: Diagnosis not present

## 2022-10-09 LAB — BASIC METABOLIC PANEL
Anion gap: 10 (ref 5–15)
BUN: <5 mg/dL — ABNORMAL LOW (ref 8–23)
CO2: 17 mmol/L — ABNORMAL LOW (ref 22–32)
Calcium: 8.3 mg/dL — ABNORMAL LOW (ref 8.9–10.3)
Chloride: 104 mmol/L (ref 98–111)
Creatinine, Ser: 0.64 mg/dL (ref 0.44–1.00)
GFR, Estimated: >60 mL/min (ref >60)
Glucose, Bld: 90 mg/dL (ref 70–99)
Potassium: 3.4 mmol/L — ABNORMAL LOW (ref 3.5–5.1)
Sodium: 131 mmol/L — ABNORMAL LOW (ref 135–145)

## 2022-10-09 MED ORDER — SODIUM CHLORIDE 0.9 % IV SOLN
INTRAVENOUS | Status: DC
Start: 2022-10-09 — End: 2022-10-09

## 2022-10-09 MED ORDER — POTASSIUM CHLORIDE 10 MEQ/100ML IV SOLN
INTRAVENOUS | Status: AC
  Filled 2022-10-09: qty 100

## 2022-10-09 MED ORDER — POTASSIUM CHLORIDE IN NACL 40-0.9 MEQ/L-% IV SOLN
INTRAVENOUS | Status: DC
  Filled 2022-10-09 (×9): qty 1000

## 2022-10-09 NOTE — Progress Notes (Signed)
   10/09/22 2158  Assess: MEWS Score  Temp 98 F (36.7 C)  BP 116/75  MAP (mmHg) 85  Pulse Rate (!) 112  Resp 17  SpO2 94 %  O2 Device Room Air  Assess: MEWS Score  MEWS Temp 0  MEWS Systolic 0  MEWS Pulse 2  MEWS RR 0  MEWS LOC 0  MEWS Score 2  MEWS Score Color Yellow  Assess: if the MEWS score is Yellow or Red  Were vital signs accurate and taken at a resting state? Yes  Does the patient meet 2 or more of the SIRS criteria? No  MEWS guidelines implemented  Yes, yellow  Treat  MEWS Interventions Considered administering scheduled or prn medications/treatments as ordered  Take Vital Signs  Increase Vital Sign Frequency  Yellow: Q2hr x1, continue Q4hrs until patient remains green for 12hrs  Escalate  MEWS: Escalate Yellow: Discuss with charge nurse and consider notifying provider and/or RRT  Notify: Charge Nurse/RN  Name of Charge Nurse/RN Notified Kevia RN  Assess: SIRS CRITERIA  SIRS Temperature  0  SIRS Pulse 1  SIRS Respirations  0  SIRS WBC 0  SIRS Score Sum  1

## 2022-10-09 NOTE — Progress Notes (Signed)
PROGRESS NOTE    [name]  TKZ:[id number] DOB: 04-11-1950 DOA: 10/05/2022 PCP: [name], PA-C    Brief Narrative:  72 y.o. female with medical history significant for GERD, hiatal hernia, hyperlipidemia, non-small cell lung cancer status post final radiation 09/25/2022 and ongoing chemotherapy with carboplatin and paclitaxel under the care of Dr. Arbutus Ped who is being admitted to the hospital with intractable nausea vomiting, and associated hypokalemia.  Patient has had multiple recent follow-ups with [facility] symptom management clinic due to continued intractable vomiting with any oral intake including liquids, as well as resulting hypokalemia.  She is not having any dysphagia, but she has a burning epigastric discomfort with any oral intake even thin liquids.  Usually about 30 minutes after p.o. intake, she vomits.  She has been constipated, but having some bowel movements with suppository.  She notes that each time she goes to the cancer clinic and is given some IV Pepcid, seems to do better for about 24 hours.  She was recently prescribed some liquid sucralfate, this seemed to slightly improve her symptoms.  She denies any chest pain, hematemesis, fevers, chills, blood in her stool.  No significant abdominal pain other than the burning epigastric discomfort after oral intake.  Today in the oncology clinic she was found to have recurrent hypokalemia, was given IV fluids, IV potassium and directly admitted to [facility].    Assessment & Plan:   Principal Problem:   Hypokalemia due to excessive gastrointestinal loss of potassium Active Problems:   Intractable nausea and vomiting   #1 Intractable nausea and vomiting- EGD 10/07/2022-moderate proximal and mid esophagitis with no bleeding.  Biopsies done.  3 cm hiatal hernia.  Multiple gastric polyps biopsied.  Normal duodenum biopsied.   full liquid diet, viscous lidocaine and GI cocktail and Protonix and Phenergan, Ativan,  Compazine Ensure/boost 3 times daily P.o. intake has improved with Compazine rectal Waiting for port placement today   #2 hypokalemia being repleted aggressively.  Recheck labs in AM.  Mag 1.9  #3 non-small cell lung CA status post radiation 09/25/2022 with ongoing chemotherapy Dr. Rhina Brackett is requesting a porta cath due to poor iv access  Miscellaneous- all po meds on hold  Estimated body mass index is 20.67 kg/m as calculated from the following:   Height as of this encounter: 5\' 7"  (1.702 m).   Weight as of this encounter: 59.9 kg.  DVT prophylaxis: lovenox Code Status: full Family Communication: none Disposition Plan:  Status is: inpatient  The patient will require care spanning > 2 midnights and should be moved to inpatient because: intractable nausea vomiting   Consultants:  Onc gi  Procedures:none Antimicrobials: none  Subjective:  Feels weak and tired but was able to eat and drink a little better yesterday evening after the Compazine she got rectally.  She did not throw up what she ate or drank.  No bowel movements objective: Vitals:   10/08/22 1335 10/08/22 2037 10/09/22 0455 10/09/22 1031  BP: 133/81 (!) 144/84 (!) 147/89 (!) 142/63  Pulse: (!) 112 (!) 108 (!) 102 99  Resp: 18 18 16 16   Temp:  98.4 F (36.9 C) 98.1 F (36.7 C) 98.1 F (36.7 C)  TempSrc:  Oral Oral Oral  SpO2: 100% 98% 96% 98%  Weight:      Height:        Intake/Output Summary (Last 24 hours) at 10/09/2022 1258 Last data filed at 10/09/2022 0004 Gross per 24 hour  Intake 2223 ml  Output --  Net 2223 ml   Filed Weights   10/05/22 1400 10/07/22 0811  Weight: 63.4 kg 59.9 kg    Examination:  General exam: Appears ill  respiratory system: Clear to auscultation. Respiratory effort normal. Cardiovascular system: S1 & S2 heard, RRR. No JVD, murmurs, rubs, gallops or clicks. No pedal edema. Gastrointestinal system: Abdomen is nondistended, soft and nontender. No organomegaly or masses  felt. Normal bowel sounds heard. Central nervous system: Alert and oriented. No focal neurological deficits. Extremities: No edema   Data Reviewed: I have personally reviewed following labs and imaging studies  CBC: Recent Labs  Lab 10/03/22 0903 10/05/22 0851 10/06/22 0537 10/07/22 1050 10/08/22 0545  WBC 4.7 6.0 3.1* 4.1 4.1  NEUTROABS 2.8 4.4  --   --   --   HGB 13.5 13.7 11.2* 13.1 11.9*  HCT 40.3 40.6 35.3* 40.3 37.2  MCV 82.8 84.2 87.2 86.1 85.5  PLT 291 255 205 236 193   Basic Metabolic Panel: Recent Labs  Lab 10/03/22 0903 10/05/22 0851 10/05/22 1334 10/06/22 0537 10/07/22 1050 10/08/22 0545 10/09/22 0528  NA 137 136 137 131* 132* 132* 131*  K 2.8* 2.8* 3.0* 3.3* 3.5 3.0* 3.4*  CL 100 99 104 102 103 105 104  CO2 21* 20* 17* 14* 14* 13* 17*  GLUCOSE 107* 86 74 70 75 84 90  BUN 11 7* 6* <5* <5* <5* <5*  CREATININE 0.67 0.57 0.53 0.64 0.65 0.65 0.64  CALCIUM 9.4 8.8* 8.3* 8.1* 8.5* 8.3* 8.3*  MG 1.6* 1.5* 1.7 1.9  --   --   --    GFR: Estimated Creatinine Clearance: 59.2 mL/min (by C-G formula based on SCr of 0.64 mg/dL). Liver Function Tests: Recent Labs  Lab 10/03/22 0903 10/05/22 0851 10/07/22 1050 10/08/22 0545  AST 27 33 38 26  ALT 28 29 33 27  ALKPHOS 66 70 57 55  BILITOT 0.5 0.5 0.8 1.1  PROT 6.5 6.9 6.1* 5.5*  ALBUMIN 3.5 3.7 2.9* 2.7*   No results for input(s): "LIPASE", "AMYLASE" in the last 168 hours. No results for input(s): "AMMONIA" in the last 168 hours. Coagulation Profile: No results for input(s): "INR", "PROTIME" in the last 168 hours. Cardiac Enzymes: No results for input(s): "CKTOTAL", "CKMB", "CKMBINDEX", "TROPONINI" in the last 168 hours. BNP (last 3 results) No results for input(s): "PROBNP" in the last 8760 hours. HbA1C: No results for input(s): "HGBA1C" in the last 72 hours. CBG: No results for input(s): "GLUCAP" in the last 168 hours. Lipid Profile: No results for input(s): "CHOL", "HDL", "LDLCALC", "TRIG",  "CHOLHDL", "LDLDIRECT" in the last 72 hours. Thyroid Function Tests: No results for input(s): "TSH", "T4TOTAL", "FREET4", "T3FREE", "THYROIDAB" in the last 72 hours. Anemia Panel: No results for input(s): "VITAMINB12", "FOLATE", "FERRITIN", "TIBC", "IRON", "RETICCTPCT" in the last 72 hours. Sepsis Labs: No results for input(s): "PROCALCITON", "LATICACIDVEN" in the last 168 hours.  No results found for this or any previous visit (from the past 240 hour(s)).       Radiology Studies: No results found.      Scheduled Meds:  feeding supplement  237 mL Oral TID BM   LORazepam  0.5 mg Oral BID   magic mouthwash  15 mL Oral QID   pantoprazole (PROTONIX) IV  40 mg Intravenous Q12H   prochlorperazine  25 mg Rectal Q12H   Continuous Infusions:  sodium chloride 75 mL/hr at 10/08/22 0232   promethazine (PHENERGAN) injection (IM or IVPB) Stopped (10/08/22 2134)     LOS: 3 days  Time spent: 39 minutes  Alwyn Ren, MD 10/09/2022, 12:58 PM

## 2022-10-09 NOTE — Progress Notes (Signed)
Patient ID: [name], female   DOB: 10/07/1950, 73 y.o.   MRN: [id number] Pt's port a cath placement has been rescheduled for 8/20. Nurse aware.

## 2022-10-09 NOTE — Progress Notes (Signed)
   10/09/22 2132  TOC Brief Assessment  Insurance and Status Reviewed  Patient has primary care physician Yes Earlene Plater, [place], [place])  Home environment has been reviewed From home alone  Prior level of function: Independent with some assist  Prior/Current Home Services No current home services  Social Determinants of Health Reivew SDOH reviewed no interventions necessary  Readmission risk has been reviewed Yes  Transition of care needs no transition of care needs at this time

## 2022-10-10 ENCOUNTER — Inpatient Hospital Stay (HOSPITAL_COMMUNITY): Payer: Medicare HMO

## 2022-10-10 DIAGNOSIS — E876 Hypokalemia: Secondary | ICD-10-CM | POA: Diagnosis not present

## 2022-10-10 HISTORY — PX: IR VENIPUNCTURE 3YRS/OLDER BY MD: IMG2287

## 2022-10-10 HISTORY — PX: IR US GUIDE VASC ACCESS RIGHT: IMG2390

## 2022-10-10 HISTORY — PX: IR IMAGING GUIDED PORT INSERTION: IMG5740

## 2022-10-10 LAB — BASIC METABOLIC PANEL
Anion gap: 10 (ref 5–15)
BUN: <5 mg/dL — ABNORMAL LOW (ref 8–23)
CO2: 19 mmol/L — ABNORMAL LOW (ref 22–32)
Calcium: 8.3 mg/dL — ABNORMAL LOW (ref 8.9–10.3)
Chloride: 104 mmol/L (ref 98–111)
Creatinine, Ser: 0.66 mg/dL (ref 0.44–1.00)
GFR, Estimated: >60 mL/min (ref >60)
Glucose, Bld: 97 mg/dL (ref 70–99)
Potassium: 3.3 mmol/L — ABNORMAL LOW (ref 3.5–5.1)
Sodium: 133 mmol/L — ABNORMAL LOW (ref 135–145)

## 2022-10-10 LAB — SURGICAL PATHOLOGY

## 2022-10-10 MED ORDER — MIDAZOLAM HCL 2 MG/2ML IJ SOLN
INTRAMUSCULAR | Status: AC | PRN
  Administered 2022-10-10 (×3): .5 mg via INTRAVENOUS

## 2022-10-10 MED ORDER — LIDOCAINE HCL 1 % IJ SOLN
INTRAMUSCULAR | Status: AC
  Filled 2022-10-10: qty 20

## 2022-10-10 MED ORDER — FENTANYL CITRATE (PF) 100 MCG/2ML IJ SOLN
INTRAMUSCULAR | Status: AC | PRN
  Administered 2022-10-10 (×3): 25 ug via INTRAVENOUS

## 2022-10-10 MED ORDER — MIDAZOLAM HCL 2 MG/2ML IJ SOLN
INTRAMUSCULAR | Status: AC
  Filled 2022-10-10: qty 2

## 2022-10-10 MED ORDER — FENTANYL CITRATE (PF) 100 MCG/2ML IJ SOLN
INTRAMUSCULAR | Status: AC
  Filled 2022-10-10: qty 2

## 2022-10-10 MED ORDER — POTASSIUM CHLORIDE 10 MEQ/100ML IV SOLN
10.0000 meq | INTRAVENOUS | Status: AC
  Administered 2022-10-10 (×4): 10 meq via INTRAVENOUS
  Filled 2022-10-10 (×4): qty 100

## 2022-10-10 MED ORDER — LIDOCAINE HCL 1 % IJ SOLN
20.0000 mL | Freq: Once | INTRAMUSCULAR | Status: AC
  Administered 2022-10-10: 16 mL via INTRADERMAL
  Filled 2022-10-10: qty 20

## 2022-10-10 NOTE — Procedures (Signed)
Interventional Radiology Procedure Note  Procedure: Single Lumen Power Port Placement    Access:  Right IJ vein.  Findings: Catheter tip positioned at SVC/RA junction. Port is ready for immediate use.   Complications: None  EBL: < 10 mL  Recommendations:  - Ok to shower in 24 hours - Do not submerge for 7 days - Routine line care   Glenn T. Yamagata, M.D Pager:  [phone]   

## 2022-10-10 NOTE — Plan of Care (Signed)

## 2022-10-10 NOTE — Plan of Care (Signed)
  Problem: Activity: Goal: Risk for activity intolerance will decrease Outcome: Not Progressing   Problem: Nutrition: Goal: Adequate nutrition will be maintained Outcome: Not Progressing   

## 2022-10-10 NOTE — Progress Notes (Signed)
PROGRESS NOTE    [name]  ZOX:[id number] DOB: 08-03-1950 DOA: 10/05/2022 PCP: [name], PA-C    Brief Narrative:  72 y.o. female with medical history significant for GERD, hiatal hernia, hyperlipidemia, non-small cell lung cancer status post final radiation 09/25/2022 and ongoing chemotherapy with carboplatin and paclitaxel under the care of Dr. Arbutus Ped who is being admitted to the hospital with intractable nausea vomiting, and associated hypokalemia.  Patient has had multiple recent follow-ups with [facility] symptom management clinic due to continued intractable vomiting with any oral intake including liquids, as well as resulting hypokalemia.  She is not having any dysphagia, but she has a burning epigastric discomfort with any oral intake even thin liquids.  Usually about 30 minutes after p.o. intake, she vomits.  She has been constipated, but having some bowel movements with suppository.  She notes that each time she goes to the cancer clinic and is given some IV Pepcid, seems to do better for about 24 hours.  She was recently prescribed some liquid sucralfate, this seemed to slightly improve her symptoms.  She denies any chest pain, hematemesis, fevers, chills, blood in her stool.  No significant abdominal pain other than the burning epigastric discomfort after oral intake.  Today in the oncology clinic she was found to have recurrent hypokalemia, was given IV fluids, IV potassium and directly admitted to [facility].    Assessment & Plan:   Principal Problem:   Hypokalemia due to excessive gastrointestinal loss of potassium Active Problems:   Intractable nausea and vomiting   #1 Intractable nausea and vomiting-due to radiation induced esophagitis.  He is being treated symptomatically.  Compazine rectal seems to be the best working for her. EGD 10/07/2022-moderate proximal and mid esophagitis with no bleeding.  Biopsies done.  3 cm hiatal hernia.  Multiple gastric polyps  biopsied.  Normal duodenum biopsied.   full liquid diet to soft diet viscous lidocaine and GI cocktail and Protonix and Phenergan, Ativan, Compazine Ensure/boost 3 times daily P.o. intake has improved with Compazine rectal Port placed 10/10/2022   #2 hypokalemia being repleted aggressively.  Recheck labs in AM.  Mag 1.9  #3 non-small cell lung CA status post radiation 09/25/2022 with ongoing chemotherapy Dr. Rhina Brackett is requesting a porta cath due to poor iv access  Estimated body mass index is 20.67 kg/m as calculated from the following:   Height as of this encounter: 5\' 7"  (1.702 m).   Weight as of this encounter: 59.9 kg.  DVT prophylaxis: lovenox Code Status: full Family Communication: none Disposition Plan:  Status is: inpatient  The patient will require care spanning > 2 midnights and should be moved to inpatient because: intractable nausea vomiting   Consultants:  Onc gi  Procedures:none Antimicrobials: none  Subjective:  Feels weak has been n.p.o. for the port placement   objective: Vitals:   10/10/22 1025 10/10/22 1030 10/10/22 1049 10/10/22 1438  BP: 119/66 124/69 (!) 137/57 (!) 111/52  Pulse: 92 93 96 98  Resp: 17 17    Temp:   (!) 97.3 F (36.3 C) 97.8 F (36.6 C)  TempSrc:   Oral Oral  SpO2: 99% 98% 99% 98%  Weight:      Height:        Intake/Output Summary (Last 24 hours) at 10/10/2022 1556 Last data filed at 10/10/2022 1500 Gross per 24 hour  Intake 744.18 ml  Output 300 ml  Net 444.18 ml   Filed Weights   10/05/22 1400 10/07/22 8119  Weight: 63.4 kg 59.9 kg    Examination:  General exam: Appears ill  respiratory system: Clear to auscultation. Respiratory effort normal. Cardiovascular system: S1 & S2 heard, RRR. No JVD, murmurs, rubs, gallops or clicks. No pedal edema. Gastrointestinal system: Abdomen is nondistended, soft and nontender. No organomegaly or masses felt. Normal bowel sounds heard. Central nervous system: Alert and oriented.  No focal neurological deficits. Extremities: No edema   Data Reviewed: I have personally reviewed following labs and imaging studies  CBC: Recent Labs  Lab 10/05/22 0851 10/06/22 0537 10/07/22 1050 10/08/22 0545  WBC 6.0 3.1* 4.1 4.1  NEUTROABS 4.4  --   --   --   HGB 13.7 11.2* 13.1 11.9*  HCT 40.6 35.3* 40.3 37.2  MCV 84.2 87.2 86.1 85.5  PLT 255 205 236 193   Basic Metabolic Panel: Recent Labs  Lab 10/05/22 0851 10/05/22 0851 10/05/22 1334 10/06/22 0537 10/07/22 1050 10/08/22 0545 10/09/22 0528 10/10/22 0557  NA 136  --  137 131* 132* 132* 131* 133*  K 2.8*  --  3.0* 3.3* 3.5 3.0* 3.4* 3.3*  CL 99  --  104 102 103 105 104 104  CO2 20*  --  17* 14* 14* 13* 17* 19*  GLUCOSE 86  --  74 70 75 84 90 97  BUN 7*  --  6* <5* <5* <5* <5* <5*  CREATININE 0.57   < > 0.53 0.64 0.65 0.65 0.64 0.66  CALCIUM 8.8*  --  8.3* 8.1* 8.5* 8.3* 8.3* 8.3*  MG 1.5*  --  1.7 1.9  --   --   --   --    < > = values in this interval not displayed.   GFR: Estimated Creatinine Clearance: 59.2 mL/min (by C-G formula based on SCr of 0.66 mg/dL). Liver Function Tests: Recent Labs  Lab 10/05/22 0851 10/07/22 1050 10/08/22 0545  AST 33 38 26  ALT 29 33 27  ALKPHOS 70 57 55  BILITOT 0.5 0.8 1.1  PROT 6.9 6.1* 5.5*  ALBUMIN 3.7 2.9* 2.7*   No results for input(s): "LIPASE", "AMYLASE" in the last 168 hours. No results for input(s): "AMMONIA" in the last 168 hours. Coagulation Profile: No results for input(s): "INR", "PROTIME" in the last 168 hours. Cardiac Enzymes: No results for input(s): "CKTOTAL", "CKMB", "CKMBINDEX", "TROPONINI" in the last 168 hours. BNP (last 3 results) No results for input(s): "PROBNP" in the last 8760 hours. HbA1C: No results for input(s): "HGBA1C" in the last 72 hours. CBG: No results for input(s): "GLUCAP" in the last 168 hours. Lipid Profile: No results for input(s): "CHOL", "HDL", "LDLCALC", "TRIG", "CHOLHDL", "LDLDIRECT" in the last 72 hours. Thyroid  Function Tests: No results for input(s): "TSH", "T4TOTAL", "FREET4", "T3FREE", "THYROIDAB" in the last 72 hours. Anemia Panel: No results for input(s): "VITAMINB12", "FOLATE", "FERRITIN", "TIBC", "IRON", "RETICCTPCT" in the last 72 hours. Sepsis Labs: No results for input(s): "PROCALCITON", "LATICACIDVEN" in the last 168 hours.  No results found for this or any previous visit (from the past 240 hour(s)).       Radiology Studies: IR IMAGING GUIDED PORT INSERTION  Result Date: 10/10/2022 CLINICAL DATA:  History of lung carcinoma with poor intravenous access and need for porta cath for continued IV access needs and lung cancer therapy. EXAM: IMPLANTED PORT A CATH PLACEMENT WITH ULTRASOUND AND FLUOROSCOPIC GUIDANCE ANESTHESIA/SEDATION: Moderate (conscious) sedation was employed during this procedure. A total of Versed 1.5 mg and Fentanyl 75 mcg was administered intravenously by radiology nursing. Moderate Sedation  Time: 32 minutes. The patient's level of consciousness and vital signs were monitored continuously by radiology nursing throughout the procedure under my direct supervision. FLUOROSCOPY: 34 seconds.  1.0 mGy. PROCEDURE: The procedure, risks, benefits, and alternatives were explained to the patient. Questions regarding the procedure were encouraged and answered. The patient understands and consents to the procedure. A time-out was performed prior to initiating the procedure. Ultrasound was utilized to confirm patency of the right internal jugular vein. The right neck and chest were prepped with chlorhexidine in a sterile fashion, and a sterile drape was applied covering the operative field. Maximum barrier sterile technique with sterile gowns and gloves were used for the procedure. Local anesthesia was provided with 1% lidocaine. After creating a small venotomy incision, a 21 gauge needle was advanced into the right internal jugular vein under direct, real-time ultrasound guidance. Ultrasound  image documentation was performed. After securing guidewire access, an 8 Fr dilator was placed. A J-wire was kinked to measure appropriate catheter length. A subcutaneous port pocket was then created along the upper chest wall utilizing sharp and blunt dissection. Portable cautery was utilized. The pocket was irrigated with sterile saline. A single lumen power injectable port was chosen for placement. The 8 Fr catheter was tunneled from the port pocket site to the venotomy incision. The port was placed in the pocket. External catheter was trimmed to appropriate length based on guidewire measurement. At the venotomy, an 8 Fr peel-away sheath was placed over a guidewire. The catheter was then placed through the sheath and the sheath removed. Final catheter positioning was confirmed and documented with a fluoroscopic spot image. The port was accessed with a needle and aspirated and flushed with heparinized saline. The access needle was left in place. The venotomy and port pocket incisions were closed with subcutaneous 3-0 Monocryl and subcuticular 4-0 Vicryl. Dermabond was applied to both incisions. COMPLICATIONS: COMPLICATIONS None FINDINGS: After catheter placement, the tip lies at the cavo-atrial junction. The catheter aspirates normally and is ready for immediate use. IMPRESSION: Placement of single lumen port a cath via right internal jugular vein. The catheter tip lies at the cavo-atrial junction. A power injectable port a cath was placed and is ready for immediate use. Electronically Signed   By: Irish Lack M.D.   On: 10/10/2022 11:45   IR US Guide Vasc Access Right  Result Date: 10/10/2022 INDICATION: Nonfunctioning peripheral IV and poor venous access. Need for venous access for IV conscious sedation prior to Port-A-Cath placement. EXAM: RIGHT UPPER EXTREMITY VENIPUNCTURE/VENOUS ACCESS PLACEMENT WITH ULTRASOUND GUIDANCE FOR VASCULAR ACCESS. MEDICATIONS: None ANESTHESIA/SEDATION: None FLUOROSCOPY: None  COMPLICATIONS: None immediate. PROCEDURE: Informed written consent was obtained from the patient after a thorough discussion of the procedural risks, benefits and alternatives. All questions were addressed. Maximal Sterile Barrier Technique was utilized including caps, mask, sterile gowns, sterile gloves, sterile drape, hand hygiene and skin antiseptic. A timeout was performed prior to the initiation of the procedure. 1% lidocaine was used for local anesthesia. Ultrasound was used to confirm patency of the right brachial vein in the upper arm. Under ultrasound image was saved. Under ultrasound guidance, a 21 gauge needle was advanced into the right brachial vein with ultrasound image documentation performed. A guidewire was advanced into the vein. A 4 French micropuncture dilator was then advanced into the vein and secured for venous access. The dilator was aspirated and flushed with saline prior to connecting to an IV line. IMPRESSION: Ultrasound-guided venous access performed at the level of the right  brachial vein with placement of a 4 French micropuncture dilator for venous access. Electronically Signed   By: Irish Lack M.D.   On: 10/10/2022 11:34   IR Venipuncture 66Yrs/Older By Md  Result Date: 10/10/2022 INDICATION: Nonfunctioning peripheral IV and poor venous access. Need for venous access for IV conscious sedation prior to Port-A-Cath placement. EXAM: RIGHT UPPER EXTREMITY VENIPUNCTURE/VENOUS ACCESS PLACEMENT WITH ULTRASOUND GUIDANCE FOR VASCULAR ACCESS. MEDICATIONS: None ANESTHESIA/SEDATION: None FLUOROSCOPY: None COMPLICATIONS: None immediate. PROCEDURE: Informed written consent was obtained from the patient after a thorough discussion of the procedural risks, benefits and alternatives. All questions were addressed. Maximal Sterile Barrier Technique was utilized including caps, mask, sterile gowns, sterile gloves, sterile drape, hand hygiene and skin antiseptic. A timeout was performed prior to  the initiation of the procedure. 1% lidocaine was used for local anesthesia. Ultrasound was used to confirm patency of the right brachial vein in the upper arm. Under ultrasound image was saved. Under ultrasound guidance, a 21 gauge needle was advanced into the right brachial vein with ultrasound image documentation performed. A guidewire was advanced into the vein. A 4 French micropuncture dilator was then advanced into the vein and secured for venous access. The dilator was aspirated and flushed with saline prior to connecting to an IV line. IMPRESSION: Ultrasound-guided venous access performed at the level of the right brachial vein with placement of a 4 French micropuncture dilator for venous access. Electronically Signed   By: Irish Lack M.D.   On: 10/10/2022 11:34        Scheduled Meds:  feeding supplement  237 mL Oral TID BM   LORazepam  0.5 mg Oral BID   magic mouthwash  15 mL Oral QID   pantoprazole (PROTONIX) IV  40 mg Intravenous Q12H   prochlorperazine  25 mg Rectal Q12H   Continuous Infusions:  0.9 % NaCl with KCl 40 mEq / L 75 mL/hr at 10/09/22 1858   promethazine (PHENERGAN) injection (IM or IVPB) Stopped (10/09/22 1530)     LOS: 4 days    Time spent: 39 minutes  Alwyn Ren, MD 10/10/2022, 3:56 PM

## 2022-10-11 ENCOUNTER — Ambulatory Visit: Payer: Medicare HMO

## 2022-10-11 DIAGNOSIS — E876 Hypokalemia: Secondary | ICD-10-CM | POA: Diagnosis not present

## 2022-10-11 LAB — BASIC METABOLIC PANEL
Anion gap: 9 (ref 5–15)
BUN: <5 mg/dL — ABNORMAL LOW (ref 8–23)
CO2: 22 mmol/L (ref 22–32)
Calcium: 8.2 mg/dL — ABNORMAL LOW (ref 8.9–10.3)
Chloride: 101 mmol/L (ref 98–111)
Creatinine, Ser: 0.62 mg/dL (ref 0.44–1.00)
GFR, Estimated: >60 mL/min (ref >60)
Glucose, Bld: 103 mg/dL — ABNORMAL HIGH (ref 70–99)
Potassium: 3.6 mmol/L (ref 3.5–5.1)
Sodium: 132 mmol/L — ABNORMAL LOW (ref 135–145)

## 2022-10-11 MED ORDER — CHLORHEXIDINE GLUCONATE CLOTH 2 % EX PADS
6.0000 | MEDICATED_PAD | Freq: Every day | CUTANEOUS | Status: DC
  Administered 2022-10-12: 6 via TOPICAL

## 2022-10-11 NOTE — Plan of Care (Signed)

## 2022-10-11 NOTE — Progress Notes (Signed)
PROGRESS NOTE    [name]  VWU:[id number] DOB: 1950-09-24 DOA: 10/05/2022 PCP: [name], PA-C   Brief Narrative:  This 72 y.o. female with medical history significant for GERD, hiatal hernia, hyperlipidemia, non-small cell lung cancer status post final radiation on 09/25/2022 and ongoing chemotherapy with carboplatin and paclitaxel under the care of Dr. Arbutus Ped who is being admitted to the hospital with intractable nausea , vomiting, and associated hypokalemia.  Patient has had multiple recent follow-ups with [facility] symptom management clinic due to continued intractable vomiting with any oral intake including liquids, as well as resulting hypokalemia.  She is not having any dysphagia, but she has a burning epigastric discomfort with any oral intake even thin liquids.  Usually about 30 minutes after p.o. intake, she vomits.  She has been constipated, but having some bowel movements with suppository.  She was recently prescribed some liquid sucralfate, this seemed to slightly improve her symptoms.  She denies any chest pain, hematemesis, fevers, chills, blood in her stool.  No significant abdominal pain other than the burning epigastric discomfort after oral intake.  Today in the oncology clinic she was found to have recurrent hypokalemia, was given IV fluids, IV potassium and directly admitted to [facility].   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 She is being treated symptomatically.  Compazine rectal seems to be the best working for her. EGD on 10/07/2022 showed moderate proximal and mid esophagitis with no bleeding.  Multiple gastric polyps biopsied.  Normal duodenum biopsied.   full liquid diet to soft diet advised,  viscous lidocaine and GI cocktail and Protonix and Phenergan,  Ativan, Compazine. Continue Ensure/boost 3 times daily. Oral intake has improved with Compazine rectal Chemo Port placed on 10/10/2022.   Hypokalemia: Replaced.  Continue to monitor  Non-small cell lung cancer s/p radiation: Patient is receiving ongoing chemotherapy by Dr. Arbutus Ped. Patient has Port-A-Cath placed due to poor IV access yesterday.   DVT prophylaxis: Lovenox Code Status: Full code Family Communication: No family at bedside. Disposition Plan: Status is: Inpatient Remains inpatient appropriate because:   Admitted for intractable nausea and vomiting secondary to radiation induced esophagitis.    Consultants:  Oncology Gastroenterology  Procedures: None Antimicrobials: None  Subjective: Patient was seen and examined at bedside.  Overnight events noted.   She still reports feeling nauseous but denies any vomiting.   She is status post port placement yesterday. She reports appetite is still reduced.  Objective: Vitals:   10/10/22 2003 10/11/22 0058 10/11/22 0059 10/11/22 0447  BP: 122/60   117/66  Pulse:  (!) 113 (!) 110 (!) 104  Resp: 18   20  Temp: 98.6 F (37 C)   98.3 F (36.8 C)  TempSrc: Oral   Oral  SpO2: 98% 94%  94%  Weight:      Height:        Intake/Output Summary (Last 24 hours) at 10/11/2022 1147 Last data filed at 10/11/2022 0130 Gross per 24 hour  Intake 1380.9 ml  Output --  Net 1380.9 ml   Filed Weights   10/05/22 1400 10/07/22 0811  Weight: 63.4 kg 59.9 kg    Examination:  General exam: Appears calm and comfortable, deconditioned, not in any acute distress. Respiratory system: Clear to auscultation. Respiratory effort normal.  RR 15 Cardiovascular system: S1 & S2  heard, regular rate and rhythm, no murmur, Chemo-Port noted in the right chest. Gastrointestinal system: Abdomen is non distended, soft and non tender. Normal bowel sounds heard. Central nervous system: Alert and oriented x 3. No focal neurological  deficits. Extremities: Symmetric 5 x 5 power. Skin: No rashes, lesions or ulcers Psychiatry: Judgement and insight appear normal. Mood & affect appropriate.     Data Reviewed: I have personally reviewed following labs and imaging studies  CBC: Recent Labs  Lab 10/05/22 0851 10/06/22 0537 10/07/22 1050 10/08/22 0545  WBC 6.0 3.1* 4.1 4.1  NEUTROABS 4.4  --   --   --   HGB 13.7 11.2* 13.1 11.9*  HCT 40.6 35.3* 40.3 37.2  MCV 84.2 87.2 86.1 85.5  PLT 255 205 236 193   Basic Metabolic Panel: Recent Labs  Lab 10/05/22 0851 10/05/22 0851 10/05/22 1334 10/06/22 0537 10/07/22 1050 10/08/22 0545 10/09/22 0528 10/10/22 0557 10/11/22 0430  NA 136  --  137 131* 132* 132* 131* 133* 132*  K 2.8*  --  3.0* 3.3* 3.5 3.0* 3.4* 3.3* 3.6  CL 99  --  104 102 103 105 104 104 101  CO2 20*  --  17* 14* 14* 13* 17* 19* 22  GLUCOSE 86  --  74 70 75 84 90 97 103*  BUN 7*  --  6* <5* <5* <5* <5* <5* <5*  CREATININE 0.57   < > 0.53 0.64 0.65 0.65 0.64 0.66 0.62  CALCIUM 8.8*  --  8.3* 8.1* 8.5* 8.3* 8.3* 8.3* 8.2*  MG 1.5*  --  1.7 1.9  --   --   --   --   --    < > = values in this interval not displayed.   GFR: Estimated Creatinine Clearance: 59.2 mL/min (by C-G formula based on SCr of 0.62 mg/dL). Liver Function Tests: Recent Labs  Lab 10/05/22 0851 10/07/22 1050 10/08/22 0545  AST 33 38 26  ALT 29 33 27  ALKPHOS 70 57 55  BILITOT 0.5 0.8 1.1  PROT 6.9 6.1* 5.5*  ALBUMIN 3.7 2.9* 2.7*   No results for input(s): "LIPASE", "AMYLASE" in the last 168 hours. No results for input(s): "AMMONIA" in the last 168 hours. Coagulation Profile: No results for input(s): "INR", "PROTIME" in the last 168 hours. Cardiac Enzymes: No results for input(s): "CKTOTAL", "CKMB", "CKMBINDEX", "TROPONINI" in the last 168 hours. BNP (last 3 results) No results for input(s): "PROBNP" in the last 8760 hours. HbA1C: No results for input(s): "HGBA1C" in the last 72 hours. CBG: No results for input(s):  "GLUCAP" in the last 168 hours. Lipid Profile: No results for input(s): "CHOL", "HDL", "LDLCALC", "TRIG", "CHOLHDL", "LDLDIRECT" in the last 72 hours. Thyroid Function Tests: No results for input(s): "TSH", "T4TOTAL", "FREET4", "T3FREE", "THYROIDAB" in the last 72 hours. Anemia Panel: No results for input(s): "VITAMINB12", "FOLATE", "FERRITIN", "TIBC", "IRON", "RETICCTPCT" in the last 72 hours. Sepsis Labs: No results for input(s): "PROCALCITON", "LATICACIDVEN" in the last 168 hours.  No results found for this or any previous visit (from the past 240 hour(s)).       Radiology Studies: IR IMAGING GUIDED PORT INSERTION  Result Date: 10/10/2022 CLINICAL DATA:  History of lung carcinoma with poor intravenous access and need for porta cath for continued IV access needs and lung cancer therapy. EXAM: IMPLANTED PORT A CATH PLACEMENT WITH ULTRASOUND AND FLUOROSCOPIC GUIDANCE ANESTHESIA/SEDATION: Moderate (conscious) sedation was employed during this procedure. A total of Versed 1.5 mg and Fentanyl 75 mcg was administered  intravenously by radiology nursing. Moderate Sedation Time: 32 minutes. The patient's level of consciousness and vital signs were monitored continuously by radiology nursing throughout the procedure under my direct supervision. FLUOROSCOPY: 34 seconds.  1.0 mGy. PROCEDURE: The procedure, risks, benefits, and alternatives were explained to the patient. Questions regarding the procedure were encouraged and answered. The patient understands and consents to the procedure. A time-out was performed prior to initiating the procedure. Ultrasound was utilized to confirm patency of the right internal jugular vein. The right neck and chest were prepped with chlorhexidine in a sterile fashion, and a sterile drape was applied covering the operative field. Maximum barrier sterile technique with sterile gowns and gloves were used for the procedure. Local anesthesia was provided with 1% lidocaine. After  creating a small venotomy incision, a 21 gauge needle was advanced into the right internal jugular vein under direct, real-time ultrasound guidance. Ultrasound image documentation was performed. After securing guidewire access, an 8 Fr dilator was placed. A J-wire was kinked to measure appropriate catheter length. A subcutaneous port pocket was then created along the upper chest wall utilizing sharp and blunt dissection. Portable cautery was utilized. The pocket was irrigated with sterile saline. A single lumen power injectable port was chosen for placement. The 8 Fr catheter was tunneled from the port pocket site to the venotomy incision. The port was placed in the pocket. External catheter was trimmed to appropriate length based on guidewire measurement. At the venotomy, an 8 Fr peel-away sheath was placed over a guidewire. The catheter was then placed through the sheath and the sheath removed. Final catheter positioning was confirmed and documented with a fluoroscopic spot image. The port was accessed with a needle and aspirated and flushed with heparinized saline. The access needle was left in place. The venotomy and port pocket incisions were closed with subcutaneous 3-0 Monocryl and subcuticular 4-0 Vicryl. Dermabond was applied to both incisions. COMPLICATIONS: COMPLICATIONS None FINDINGS: After catheter placement, the tip lies at the cavo-atrial junction. The catheter aspirates normally and is ready for immediate use. IMPRESSION: Placement of single lumen port a cath via right internal jugular vein. The catheter tip lies at the cavo-atrial junction. A power injectable port a cath was placed and is ready for immediate use. Electronically Signed   By: Irish Lack M.D.   On: 10/10/2022 11:45   IR US Guide Vasc Access Right  Result Date: 10/10/2022 INDICATION: Nonfunctioning peripheral IV and poor venous access. Need for venous access for IV conscious sedation prior to Port-A-Cath placement. EXAM: RIGHT  UPPER EXTREMITY VENIPUNCTURE/VENOUS ACCESS PLACEMENT WITH ULTRASOUND GUIDANCE FOR VASCULAR ACCESS. MEDICATIONS: None ANESTHESIA/SEDATION: None FLUOROSCOPY: None COMPLICATIONS: None immediate. PROCEDURE: Informed written consent was obtained from the patient after a thorough discussion of the procedural risks, benefits and alternatives. All questions were addressed. Maximal Sterile Barrier Technique was utilized including caps, mask, sterile gowns, sterile gloves, sterile drape, hand hygiene and skin antiseptic. A timeout was performed prior to the initiation of the procedure. 1% lidocaine was used for local anesthesia. Ultrasound was used to confirm patency of the right brachial vein in the upper arm. Under ultrasound image was saved. Under ultrasound guidance, a 21 gauge needle was advanced into the right brachial vein with ultrasound image documentation performed. A guidewire was advanced into the vein. A 4 French micropuncture dilator was then advanced into the vein and secured for venous access. The dilator was aspirated and flushed with saline prior to connecting to an IV line. IMPRESSION: Ultrasound-guided venous access performed  at the level of the right brachial vein with placement of a 4 French micropuncture dilator for venous access. Electronically Signed   By: Irish Lack M.D.   On: 10/10/2022 11:34   IR Venipuncture 87Yrs/Older By Md  Result Date: 10/10/2022 INDICATION: Nonfunctioning peripheral IV and poor venous access. Need for venous access for IV conscious sedation prior to Port-A-Cath placement. EXAM: RIGHT UPPER EXTREMITY VENIPUNCTURE/VENOUS ACCESS PLACEMENT WITH ULTRASOUND GUIDANCE FOR VASCULAR ACCESS. MEDICATIONS: None ANESTHESIA/SEDATION: None FLUOROSCOPY: None COMPLICATIONS: None immediate. PROCEDURE: Informed written consent was obtained from the patient after a thorough discussion of the procedural risks, benefits and alternatives. All questions were addressed. Maximal Sterile Barrier  Technique was utilized including caps, mask, sterile gowns, sterile gloves, sterile drape, hand hygiene and skin antiseptic. A timeout was performed prior to the initiation of the procedure. 1% lidocaine was used for local anesthesia. Ultrasound was used to confirm patency of the right brachial vein in the upper arm. Under ultrasound image was saved. Under ultrasound guidance, a 21 gauge needle was advanced into the right brachial vein with ultrasound image documentation performed. A guidewire was advanced into the vein. A 4 French micropuncture dilator was then advanced into the vein and secured for venous access. The dilator was aspirated and flushed with saline prior to connecting to an IV line. IMPRESSION: Ultrasound-guided venous access performed at the level of the right brachial vein with placement of a 4 French micropuncture dilator for venous access. Electronically Signed   By: Irish Lack M.D.   On: 10/10/2022 11:34    Scheduled Meds:  feeding supplement  237 mL Oral TID BM   LORazepam  0.5 mg Oral BID   magic mouthwash  15 mL Oral QID   pantoprazole (PROTONIX) IV  40 mg Intravenous Q12H   prochlorperazine  25 mg Rectal Q12H   Continuous Infusions:  0.9 % NaCl with KCl 40 mEq / L 75 mL/hr at 10/09/22 1858   promethazine (PHENERGAN) injection (IM or IVPB) Stopped (10/09/22 1530)     LOS: 5 days    Time spent: 50 mins    Willeen Niece, MD [facility]   If 7PM-7AM, please contact night-coverage

## 2022-10-11 NOTE — Care Management Important Message (Signed)
Important Message  Patient Details IM Letter given. Name: [name] MRN: [id number] Date of Birth: 06/08/1950   Medicare Important Message Given:  Yes     [name], [name] 10/11/2022, 10:03 AM

## 2022-10-12 DIAGNOSIS — E876 Hypokalemia: Secondary | ICD-10-CM | POA: Diagnosis not present

## 2022-10-12 LAB — BASIC METABOLIC PANEL
Anion gap: 7 (ref 5–15)
BUN: <5 mg/dL — ABNORMAL LOW (ref 8–23)
CO2: 25 mmol/L (ref 22–32)
Calcium: 8.1 mg/dL — ABNORMAL LOW (ref 8.9–10.3)
Chloride: 100 mmol/L (ref 98–111)
Creatinine, Ser: 0.69 mg/dL (ref 0.44–1.00)
GFR, Estimated: >60 mL/min (ref >60)
Glucose, Bld: 114 mg/dL — ABNORMAL HIGH (ref 70–99)
Potassium: 3.7 mmol/L (ref 3.5–5.1)
Sodium: 132 mmol/L — ABNORMAL LOW (ref 135–145)

## 2022-10-12 LAB — CBC
HCT: 31.4 % — ABNORMAL LOW (ref 36.0–46.0)
Hemoglobin: 10.4 g/dL — ABNORMAL LOW (ref 12.0–15.0)
MCH: 28.1 pg (ref 26.0–34.0)
MCHC: 33.1 g/dL (ref 30.0–36.0)
MCV: 84.9 fL (ref 80.0–100.0)
Platelets: 126 10*3/uL — ABNORMAL LOW (ref 150–400)
RBC: 3.7 MIL/uL — ABNORMAL LOW (ref 3.87–5.11)
RDW: 23.3 % — ABNORMAL HIGH (ref 11.5–15.5)
WBC: 3.9 10*3/uL — ABNORMAL LOW (ref 4.0–10.5)
nRBC: 0 % (ref 0.0–0.2)

## 2022-10-12 LAB — MAGNESIUM: Magnesium: 1.2 mg/dL — ABNORMAL LOW (ref 1.7–2.4)

## 2022-10-12 LAB — PHOSPHORUS: Phosphorus: 3.5 mg/dL (ref 2.5–4.6)

## 2022-10-12 MED ORDER — MAGNESIUM SULFATE 2 GM/50ML IV SOLN
2.0000 g | Freq: Once | INTRAVENOUS | Status: AC
  Administered 2022-10-12: 2 g via INTRAVENOUS
  Filled 2022-10-12: qty 50

## 2022-10-12 NOTE — Progress Notes (Signed)
PROGRESS NOTE    [name]  ZOX:[id number] DOB: 04-Jul-1950 DOA: 10/05/2022 PCP: [name], PA-C   Brief Narrative:  This 72 y.o. female with medical history significant for GERD, hiatal hernia, hyperlipidemia, non-small cell lung cancer status post final radiation on 09/25/2022 and ongoing chemotherapy with carboplatin and paclitaxel under the care of Dr. Arbutus Ped who is being admitted to the hospital with intractable nausea , vomiting, and associated hypokalemia.  Patient has had multiple recent follow-ups with [facility] symptom management clinic due to continued intractable vomiting with any oral intake including liquids, as well as resulting hypokalemia.  She is not having any dysphagia, but she has a burning epigastric discomfort with any oral intake even thin liquids.  Usually about 30 minutes after p.o. intake, she vomits.  She has been constipated, but having some bowel movements with suppository.  She was recently prescribed some liquid sucralfate, this seemed to slightly improve her symptoms.  She denies any chest pain, hematemesis, fevers, chills, blood in her stool.  No significant abdominal pain other than the burning epigastric discomfort after oral intake.  Today in the oncology clinic she was found to have recurrent hypokalemia, was given IV fluids, IV potassium and directly admitted to [facility].   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 She is being treated symptomatically.  Compazine rectal seems to be the best working for her. EGD on 10/07/2022 showed moderate proximal and mid esophagitis with no bleeding.  Multiple gastric polyps biopsied.  Normal duodenum biopsied.   full liquid diet to soft diet advised,  viscous lidocaine and GI cocktail and Protonix and Phenergan,  Ativan, Compazine. Continue Ensure/boost 3 times daily. Oral intake has improved with Compazine rectal. Chemo Port placed on 10/10/2022. Patient still has poor oral intake.    Hypokalemia: Replaced.  Continue to monitor  Hypomagnesemia: Replaced.  Continue to monitor.  Non-small cell lung cancer s/p radiation: Patient is receiving ongoing chemotherapy by Dr. Arbutus Ped. Patient has Port-A-Cath placed due to poor IV access yesterday.   DVT prophylaxis: Lovenox Code Status: Full code Family Communication: No family at bedside. Disposition Plan: Status is: Inpatient Remains inpatient appropriate because:   Admitted for intractable nausea and vomiting secondary to radiation induced esophagitis.    Consultants:  Oncology Gastroenterology  Procedures: None Antimicrobials: None  Subjective: Patient was seen and examined at bedside.  Overnight events noted.   Patient still reports feeling nauseous but denies any vomiting. She is status post port placement yesterday. She reports appetite is still reduced.  Objective: Vitals:   10/11/22 2154 10/11/22 2223 10/11/22 2240 10/12/22 0533  BP: 99/60  117/66 (!) 115/55  Pulse:  (!) 110 (!) 106 (!) 106  Resp: 16   16  Temp: 97.8 F (36.6 C)   98.2 F (36.8 C)  TempSrc: Oral   Oral  SpO2: 94% 94%  95%  Weight:      Height:        Intake/Output Summary (Last 24 hours) at 10/12/2022 1245 Last data filed at 10/11/2022 2100 Gross per 24 hour  Intake 0 ml  Output --  Net 0 ml   Filed Weights   10/05/22 1400 10/07/22 0811  Weight: 63.4 kg 59.9 kg    Examination:  General exam: Appears calm and comfortable, deconditioned, not in any acute distress. Respiratory system: Clear  to auscultation. Respiratory effort normal.  RR 14 Cardiovascular system: S1 & S2 heard, regular rate and rhythm, no murmur, Chemo-Port noted in the right chest. Gastrointestinal system: Abdomen is non distended, soft and non tender. Normal bowel sounds  heard. Central nervous system: Alert and oriented x 3. No focal neurological deficits. Extremities: Symmetric 5 x 5 power. Skin: No rashes, lesions or ulcers Psychiatry: Judgement and insight appear normal. Mood & affect appropriate.     Data Reviewed: I have personally reviewed following labs and imaging studies  CBC: Recent Labs  Lab 10/06/22 0537 10/07/22 1050 10/08/22 0545 10/12/22 0500  WBC 3.1* 4.1 4.1 3.9*  HGB 11.2* 13.1 11.9* 10.4*  HCT 35.3* 40.3 37.2 31.4*  MCV 87.2 86.1 85.5 84.9  PLT 205 236 193 126*   Basic Metabolic Panel: Recent Labs  Lab 10/05/22 1334 10/06/22 0537 10/07/22 1050 10/08/22 0545 10/09/22 0528 10/10/22 0557 10/11/22 0430 10/12/22 0500  NA 137 131*   < > 132* 131* 133* 132* 132*  K 3.0* 3.3*   < > 3.0* 3.4* 3.3* 3.6 3.7  CL 104 102   < > 105 104 104 101 100  CO2 17* 14*   < > 13* 17* 19* 22 25  GLUCOSE 74 70   < > 84 90 97 103* 114*  BUN 6* <5*   < > <5* <5* <5* <5* <5*  CREATININE 0.53 0.64   < > 0.65 0.64 0.66 0.62 0.69  CALCIUM 8.3* 8.1*   < > 8.3* 8.3* 8.3* 8.2* 8.1*  MG 1.7 1.9  --   --   --   --   --  1.2*  PHOS  --   --   --   --   --   --   --  3.5   < > = values in this interval not displayed.   GFR: Estimated Creatinine Clearance: 59.2 mL/min (by C-G formula based on SCr of 0.69 mg/dL). Liver Function Tests: Recent Labs  Lab 10/07/22 1050 10/08/22 0545  AST 38 26  ALT 33 27  ALKPHOS 57 55  BILITOT 0.8 1.1  PROT 6.1* 5.5*  ALBUMIN 2.9* 2.7*   No results for input(s): "LIPASE", "AMYLASE" in the last 168 hours. No results for input(s): "AMMONIA" in the last 168 hours. Coagulation Profile: No results for input(s): "INR", "PROTIME" in the last 168 hours. Cardiac Enzymes: No results for input(s): "CKTOTAL", "CKMB", "CKMBINDEX", "TROPONINI" in the last 168 hours. BNP (last 3 results) No results for input(s): "PROBNP" in the last 8760 hours. HbA1C: No results for input(s): "HGBA1C" in the last 72 hours. CBG: No  results for input(s): "GLUCAP" in the last 168 hours. Lipid Profile: No results for input(s): "CHOL", "HDL", "LDLCALC", "TRIG", "CHOLHDL", "LDLDIRECT" in the last 72 hours. Thyroid Function Tests: No results for input(s): "TSH", "T4TOTAL", "FREET4", "T3FREE", "THYROIDAB" in the last 72 hours. Anemia Panel: No results for input(s): "VITAMINB12", "FOLATE", "FERRITIN", "TIBC", "IRON", "RETICCTPCT" in the last 72 hours. Sepsis Labs: No results for input(s): "PROCALCITON", "LATICACIDVEN" in the last 168 hours.  No results found for this or any previous visit (from the past 240 hour(s)).   Radiology Studies: No results found.  Scheduled Meds:  Chlorhexidine Gluconate Cloth  6 each Topical Daily   feeding supplement  237 mL Oral TID BM   LORazepam  0.5 mg Oral BID   magic mouthwash  15 mL Oral QID   pantoprazole (PROTONIX) IV  40 mg Intravenous Q12H   prochlorperazine  25 mg Rectal  Q12H   Continuous Infusions:  0.9 % NaCl with KCl 40 mEq / L 75 mL/hr at 10/12/22 0550   promethazine (PHENERGAN) injection (IM or IVPB) Stopped (10/09/22 1530)     LOS: 6 days    Time spent: 35 mins    Willeen Niece, MD [facility]   If 7PM-7AM, please contact night-coverage

## 2022-10-12 NOTE — Plan of Care (Signed)
  Problem: Education: Goal: Knowledge of General Education information will improve Description Including pain rating scale, medication(s)/side effects and non-pharmacologic comfort measures Outcome: Progressing   

## 2022-10-12 NOTE — Progress Notes (Signed)
   10/12/22 1353  Assess: MEWS Score  Temp 97.9 F (36.6 C)  BP 129/73  MAP (mmHg) 92  Pulse Rate (!) 117  SpO2 96 %  O2 Device Room Air  Assess: MEWS Score  MEWS Temp 0  MEWS Systolic 0  MEWS Pulse 2  MEWS RR 0  MEWS LOC 0  MEWS Score 2  MEWS Score Color Yellow  Assess: if the MEWS score is Yellow or Red  Were vital signs accurate and taken at a resting state? Yes  Does the patient meet 2 or more of the SIRS criteria? No  MEWS guidelines implemented  Yes, yellow  Treat  MEWS Interventions Considered administering scheduled or prn medications/treatments as ordered  Take Vital Signs  Increase Vital Sign Frequency  Yellow: Q2hr x1, continue Q4hrs until patient remains green for 12hrs  Escalate  MEWS: Escalate Yellow: Discuss with charge nurse and consider notifying provider and/or RRT  Notify: Charge Nurse/RN  Name of Charge Nurse/RN Notified Passenger transport manager  Assess: SIRS CRITERIA  SIRS Temperature  0  SIRS Pulse 1  SIRS Respirations  0  SIRS WBC 0  SIRS Score Sum  1

## 2022-10-12 NOTE — Progress Notes (Signed)
Mobility Specialist - Progress Note   10/12/22 0940  Mobility  Activity Ambulated with assistance in hallway  Level of Assistance Standby assist, set-up cues, supervision of patient - no hands on  Assistive Device Front wheel walker  Distance Ambulated (ft) 250 ft  Activity Response Tolerated well  Mobility Referral Yes  $Mobility charge 1 Mobility  Mobility Specialist Start Time (ACUTE ONLY) [id number]  Mobility Specialist Stop Time (ACUTE ONLY) 0940  Mobility Specialist Time Calculation (min) (ACUTE ONLY) 18 min   Pt received in bed and agreeable to mobility. No complaints during session. Pt to bed after session with all needs met.    [facility]

## 2022-10-13 ENCOUNTER — Inpatient Hospital Stay: Payer: Medicare HMO

## 2022-10-13 DIAGNOSIS — E876 Hypokalemia: Secondary | ICD-10-CM | POA: Diagnosis not present

## 2022-10-13 LAB — BASIC METABOLIC PANEL
Anion gap: 9 (ref 5–15)
BUN: <5 mg/dL — ABNORMAL LOW (ref 8–23)
CO2: 27 mmol/L (ref 22–32)
Calcium: 8 mg/dL — ABNORMAL LOW (ref 8.9–10.3)
Chloride: 94 mmol/L — ABNORMAL LOW (ref 98–111)
Creatinine, Ser: 0.63 mg/dL (ref 0.44–1.00)
GFR, Estimated: >60 mL/min (ref >60)
Glucose, Bld: 103 mg/dL — ABNORMAL HIGH (ref 70–99)
Potassium: 3.9 mmol/L (ref 3.5–5.1)
Sodium: 130 mmol/L — ABNORMAL LOW (ref 135–145)

## 2022-10-13 LAB — MAGNESIUM: Magnesium: 1.7 mg/dL (ref 1.7–2.4)

## 2022-10-13 MED ORDER — PANTOPRAZOLE SODIUM 40 MG PO TBEC: 40.0000 mg | DELAYED_RELEASE_TABLET | Freq: Two times a day (BID) | ORAL | 0 refills | Status: DC

## 2022-10-13 MED ORDER — PROCHLORPERAZINE 25 MG RE SUPP: 25.0000 mg | Freq: Two times a day (BID) | RECTAL | 0 refills | Status: DC

## 2022-10-13 MED ORDER — HEPARIN SOD (PORK) LOCK FLUSH 100 UNIT/ML IV SOLN
500.0000 [IU] | INTRAVENOUS | Status: DC | PRN
  Filled 2022-10-13: qty 5

## 2022-10-13 MED ORDER — MAGNESIUM SULFATE 2 GM/50ML IV SOLN
2.0000 g | Freq: Once | INTRAVENOUS | Status: AC
  Administered 2022-10-13: 2 g via INTRAVENOUS
  Filled 2022-10-13: qty 50

## 2022-10-13 NOTE — Discharge Summary (Signed)
Physician Discharge Summary  Jurline Sprung WUJ:[id number] DOB: 11/10/1950 DOA: 10/05/2022  PCP: [name], PA-C  Admit date: 10/05/2022  Discharge date: 10/13/2022  Admitted From: Home.  Disposition:  Home.  Recommendations for Outpatient Follow-up:  Follow up with PCP in 1-2 weeks. Please obtain BMP/CBC in one week. Advised to follow-up with gastroenterology as scheduled. Advised to take promethazine suppository for nausea and vomiting. Advised to continue pantoprazole 40 mg every 12 hours. Advised  to follow-up with oncology Dr. Arbutus Ped in 1 week  Home Health:None Equipment/Devices:None  Discharge Condition: Stable CODE STATUS:Full code Diet recommendation: Heart Healthy   Brief [facility] Course: This 72 y.o. female with medical history significant for GERD, hiatal hernia, hyperlipidemia, non-small cell lung cancer status post final radiation on 09/25/2022 and ongoing chemotherapy with carboplatin and paclitaxel under the care of Dr. Arbutus Ped who is being admitted to the hospital with intractable nausea , vomiting, and associated hypokalemia.  Patient has had multiple recent follow-ups with [facility] symptom management clinic due to continued intractable vomiting with any oral intake including liquids, as well as resulting in hypokalemia.  She is not having any dysphagia, but she has a burning epigastric discomfort with any oral intake even thin liquids.  Usually about 30 minutes after p.o. intake, she vomits.  She has been constipated, but having some bowel movements with suppository.  She was recently prescribed some liquid sucralfate, this seemed to slightly improve her symptoms.  She denies any chest pain, hematemesis, fevers, chills, blood in her stool.  No significant abdominal pain other than the burning epigastric discomfort after oral intake.  Today in the oncology clinic she was found to have recurrent hypokalemia, was given IV fluids, IV potassium and  directly admitted to [facility]  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  Patient continued on rectal Compazine.  Oral intake is improved.  EGD showed proximal and mid esophagitis with no bleeding.  Biopsies were taken.  Patient has a Chemo-Port placed by IR.  Patient will follow-up with Dr. Arbutus Ped outpatient for further management.  Patient feels better and wants to be discharged.  Patient is being discharged home.   Discharge Diagnoses:  Principal Problem:   Hypokalemia due to excessive gastrointestinal loss of potassium Active Problems:   Intractable nausea and vomiting  ntractable Nausea and vomiting: Likely due to radiation induced esophagitis. She is being treated symptomatically.  Compazine rectal seems to be the best working for her. EGD on 10/07/2022 showed moderate proximal and mid esophagitis with no bleeding.  Multiple gastric polyps biopsied.  Normal duodenum biopsied.    full liquid diet to soft diet advised,  viscous lidocaine and GI cocktail and Protonix and Phenergan, Ativan, Compazine. Continue Ensure/boost 3 times daily. Oral intake has improved with Compazine rectal. Chemo Port placed on 10/10/2022. Patient still has poor oral intake.    Hypokalemia: Replaced.  Improved.   Hypomagnesemia: Replaced.  Resolved.   Non-small cell lung cancer s/p radiation: Patient is receiving ongoing chemotherapy by Dr. Arbutus Ped. Patient has Port-A-Cath placed due to poor IV access yesterday. Follow-up outpatient with Dr. Arbutus Ped  Discharge Instructions  Discharge Instructions     Call MD for:  difficulty breathing, headache or visual disturbances   Complete by: As directed    Call MD for:  persistant dizziness or light-headedness   Complete by: As directed    Call MD for:  persistant nausea and vomiting   Complete by: As directed  Diet - low sodium heart healthy   Complete by: As directed     Diet Carb Modified   Complete by: As directed    Discharge instructions   Complete by: As directed    Advised to follow-up with primary care physician in 1 week. Advised to follow-up with gastroenterology as scheduled. Advised to take promethazine suppository for esophagitis. Advised to continue pantoprazole 40 mg every 12 hours.   Increase activity slowly   Complete by: As directed    No wound care   Complete by: As directed       Allergies as of 10/13/2022       Reactions   Codeine Hives   Surgical Lubricant Hives        Medication List     STOP taking these medications    benzonatate 200 MG capsule Commonly known as: TESSALON   cetirizine 10 MG tablet Commonly known as: ZYRTEC   omeprazole 20 MG capsule Commonly known as: PRILOSEC   sulfamethoxazole-trimethoprim 800-160 MG tablet Commonly known as: BACTRIM DS       TAKE these medications    CO Q-10 PO Take 1 tablet by mouth daily.   cyclobenzaprine 5 MG tablet Commonly known as: FLEXERIL Take 1 tablet (5 mg total) by mouth 3 (three) times daily as needed for muscle spasms.   HYDROcodone-acetaminophen 7.5-325 mg/15 ml solution Commonly known as: HYCET Take 5-10 mLs by mouth every 4 (four) hours as needed for moderate pain.   Leg Cramps Subl Place 2 tablets under the tongue daily as needed (leg cramps).   lidocaine 2 % solution Commonly known as: XYLOCAINE Use as directed 15 mLs in the mouth or throat every 6 (six) hours as needed for mouth pain.   Maalox Max 400-400-40 MG/5ML suspension Generic drug: alum & mag hydroxide-simeth Take 15 mLs by mouth every 6 (six) hours as needed for indigestion.   montelukast 10 MG tablet Commonly known as: SINGULAIR TAKE 1 TABLET AT BEDTIME What changed:  how much to take how to take this when to take this additional instructions   MULTIVITAMIN ADULT PO Take 2 tablets by mouth daily.   mupirocin ointment 2 % Commonly known as: BACTROBAN Apply 1  application topically 2 (two) times daily. What changed:  when to take this reasons to take this   ondansetron 8 MG disintegrating tablet Commonly known as: ZOFRAN-ODT Take 1 tablet (8 mg total) by mouth every 8 (eight) hours as needed for nausea or vomiting.   pantoprazole 40 MG tablet Commonly known as: Protonix Take 1 tablet (40 mg total) by mouth 2 (two) times daily.   polyethylene glycol 17 g packet Commonly known as: MIRALAX / GLYCOLAX Take 17 g by mouth daily as needed for moderate constipation.   potassium chloride SA 20 MEQ tablet Commonly known as: KLOR-CON M Take 1 tablet (20 mEq total) by mouth daily.   prochlorperazine 25 MG suppository Commonly known as: COMPAZINE Place 1 suppository (25 mg total) rectally every 12 (twelve) hours.   promethazine 25 MG suppository Commonly known as: PHENERGAN Place 1 suppository (25 mg total) rectally every 8 (eight) hours as needed for nausea or vomiting.   rosuvastatin 5 MG tablet Commonly known as: CRESTOR TAKE 1 TABLET EVERY DAY What changed: when to take this   sucralfate 1 g tablet Commonly known as: Carafate Take 1 tablet (1 g total) by mouth 4 (four) times daily. Dissolve each tablet in 15 cc water before use. What changed:  additional instructions Another  medication with the same name was removed. Continue taking this medication, and follow the directions you see here.   Trelegy Ellipta 100-62.5-25 MCG/ACT Aepb Generic drug: Fluticasone-Umeclidin-Vilant Inhale 1 puff into the lungs daily. What changed:  when to take this reasons to take this   VITAMIN D PO Take 2 tablets by mouth daily.         Follow-up Information     [name], PA-C Follow up in 1 week(s).   Specialty: Physician Assistant Contact information: [address] [place] [address] [phone]         Si Gaul, MD Follow up in 1 week(s).   Specialty: Oncology Contact information: [address]  [place] [address] [phone]                Allergies  Allergen Reactions   Codeine Hives   Surgical Lubricant Hives    Consultations: Oncology Gactroenterology   Procedures/Studies: IR IMAGING GUIDED PORT INSERTION  Result Date: 10/10/2022 CLINICAL DATA:  History of lung carcinoma with poor intravenous access and need for porta cath for continued IV access needs and lung cancer therapy. EXAM: IMPLANTED PORT A CATH PLACEMENT WITH ULTRASOUND AND FLUOROSCOPIC GUIDANCE ANESTHESIA/SEDATION: Moderate (conscious) sedation was employed during this procedure. A total of Versed 1.5 mg and Fentanyl 75 mcg was administered intravenously by radiology nursing. Moderate Sedation Time: 32 minutes. The patient's level of consciousness and vital signs were monitored continuously by radiology nursing throughout the procedure under my direct supervision. FLUOROSCOPY: 34 seconds.  1.0 mGy. PROCEDURE: The procedure, risks, benefits, and alternatives were explained to the patient. Questions regarding the procedure were encouraged and answered. The patient understands and consents to the procedure. A time-out was performed prior to initiating the procedure. Ultrasound was utilized to confirm patency of the right internal jugular vein. The right neck and chest were prepped with chlorhexidine in a sterile fashion, and a sterile drape was applied covering the operative field. Maximum barrier sterile technique with sterile gowns and gloves were used for the procedure. Local anesthesia was provided with 1% lidocaine. After creating a small venotomy incision, a 21 gauge needle was advanced into the right internal jugular vein under direct, real-time ultrasound guidance. Ultrasound image documentation was performed. After securing guidewire access, an 8 Fr dilator was placed. A J-wire was kinked to measure appropriate catheter length. A subcutaneous port pocket was then created along the upper chest wall  utilizing sharp and blunt dissection. Portable cautery was utilized. The pocket was irrigated with sterile saline. A single lumen power injectable port was chosen for placement. The 8 Fr catheter was tunneled from the port pocket site to the venotomy incision. The port was placed in the pocket. External catheter was trimmed to appropriate length based on guidewire measurement. At the venotomy, an 8 Fr peel-away sheath was placed over a guidewire. The catheter was then placed through the sheath and the sheath removed. Final catheter positioning was confirmed and documented with a fluoroscopic spot image. The port was accessed with a needle and aspirated and flushed with heparinized saline. The access needle was left in place. The venotomy and port pocket incisions were closed with subcutaneous 3-0 Monocryl and subcuticular 4-0 Vicryl. Dermabond was applied to both incisions. COMPLICATIONS: COMPLICATIONS None FINDINGS: After catheter placement, the tip lies at the cavo-atrial junction. The catheter aspirates normally and is ready for immediate use. IMPRESSION: Placement of single lumen port a cath via right internal jugular vein. The catheter tip lies at  the cavo-atrial junction. A power injectable port a cath was placed and is ready for immediate use. Electronically Signed   By: Irish Lack M.D.   On: 10/10/2022 11:45   IR US Guide Vasc Access Right  Result Date: 10/10/2022 INDICATION: Nonfunctioning peripheral IV and poor venous access. Need for venous access for IV conscious sedation prior to Port-A-Cath placement. EXAM: RIGHT UPPER EXTREMITY VENIPUNCTURE/VENOUS ACCESS PLACEMENT WITH ULTRASOUND GUIDANCE FOR VASCULAR ACCESS. MEDICATIONS: None ANESTHESIA/SEDATION: None FLUOROSCOPY: None COMPLICATIONS: None immediate. PROCEDURE: Informed written consent was obtained from the patient after a thorough discussion of the procedural risks, benefits and alternatives. All questions were addressed. Maximal Sterile  Barrier Technique was utilized including caps, mask, sterile gowns, sterile gloves, sterile drape, hand hygiene and skin antiseptic. A timeout was performed prior to the initiation of the procedure. 1% lidocaine was used for local anesthesia. Ultrasound was used to confirm patency of the right brachial vein in the upper arm. Under ultrasound image was saved. Under ultrasound guidance, a 21 gauge needle was advanced into the right brachial vein with ultrasound image documentation performed. A guidewire was advanced into the vein. A 4 French micropuncture dilator was then advanced into the vein and secured for venous access. The dilator was aspirated and flushed with saline prior to connecting to an IV line. IMPRESSION: Ultrasound-guided venous access performed at the level of the right brachial vein with placement of a 4 French micropuncture dilator for venous access. Electronically Signed   By: Irish Lack M.D.   On: 10/10/2022 11:34   IR Venipuncture 74Yrs/Older By Md  Result Date: 10/10/2022 INDICATION: Nonfunctioning peripheral IV and poor venous access. Need for venous access for IV conscious sedation prior to Port-A-Cath placement. EXAM: RIGHT UPPER EXTREMITY VENIPUNCTURE/VENOUS ACCESS PLACEMENT WITH ULTRASOUND GUIDANCE FOR VASCULAR ACCESS. MEDICATIONS: None ANESTHESIA/SEDATION: None FLUOROSCOPY: None COMPLICATIONS: None immediate. PROCEDURE: Informed written consent was obtained from the patient after a thorough discussion of the procedural risks, benefits and alternatives. All questions were addressed. Maximal Sterile Barrier Technique was utilized including caps, mask, sterile gowns, sterile gloves, sterile drape, hand hygiene and skin antiseptic. A timeout was performed prior to the initiation of the procedure. 1% lidocaine was used for local anesthesia. Ultrasound was used to confirm patency of the right brachial vein in the upper arm. Under ultrasound image was saved. Under ultrasound guidance, a  21 gauge needle was advanced into the right brachial vein with ultrasound image documentation performed. A guidewire was advanced into the vein. A 4 French micropuncture dilator was then advanced into the vein and secured for venous access. The dilator was aspirated and flushed with saline prior to connecting to an IV line. IMPRESSION: Ultrasound-guided venous access performed at the level of the right brachial vein with placement of a 4 French micropuncture dilator for venous access. Electronically Signed   By: Irish Lack M.D.   On: 10/10/2022 11:34   DG Abd 1 View  Result Date: 10/05/2022 CLINICAL DATA:  Intractable nausea vomiting.  Lung cancer. EXAM: ABDOMEN - 1 VIEW COMPARISON:  CT abdomen pelvis dated 09/10/2022. FINDINGS: No bowel dilatation or evidence of obstruction. No free air. A pessary is noted in the pelvis. Degenerative changes of the spine. No acute osseous pathology. IMPRESSION: Nonobstructive bowel gas pattern. Electronically Signed   By: Elgie Collard M.D.   On: 10/05/2022 17:41     Subjective: Patient was seen and examined at bedside.  Overnight events noted.   Patient feels much better,  reports she has improved oral intake.  She wants to be discharged.  Discharge Exam: Vitals:   10/13/22 0059 10/13/22 0518  BP: 118/68 122/67  Pulse: (!) 108 (!) 107  Resp: 16 16  Temp: 97.9 F (36.6 C) 98 F (36.7 C)  SpO2: 96% 95%   Vitals:   10/12/22 1700 10/12/22 2148 10/13/22 0059 10/13/22 0518  BP: (!) 109/58 122/70 118/68 122/67  Pulse: (!) 104 (!) 109 (!) 108 (!) 107  Resp: 14 16 16 16   Temp: 98 F (36.7 C) 98.3 F (36.8 C) 97.9 F (36.6 C) 98 F (36.7 C)  TempSrc: Oral Oral Oral Oral  SpO2: 98% 96% 96% 95%  Weight:      Height:        General: Pt is alert, awake, not in acute distress Cardiovascular: RRR, S1/S2 +, no rubs, no gallops Respiratory: CTA bilaterally, no wheezing, no rhonchi Abdominal: Soft, NT, ND, bowel sounds + Extremities: no edema, no  cyanosis    The results of significant diagnostics from this hospitalization (including imaging, microbiology, ancillary and laboratory) are listed below for reference.     Microbiology: No results found for this or any previous visit (from the past 240 hour(s)).   Labs: BNP (last 3 results) No results for input(s): "BNP" in the last 8760 hours. Basic Metabolic Panel: Recent Labs  Lab 10/09/22 0528 10/10/22 0557 10/11/22 0430 10/12/22 0500 10/13/22 0516  NA 131* 133* 132* 132* 130*  K 3.4* 3.3* 3.6 3.7 3.9  CL 104 104 101 100 94*  CO2 17* 19* 22 25 27   GLUCOSE 90 97 103* 114* 103*  BUN <5* <5* <5* <5* <5*  CREATININE 0.64 0.66 0.62 0.69 0.63  CALCIUM 8.3* 8.3* 8.2* 8.1* 8.0*  MG  --   --   --  1.2* 1.7  PHOS  --   --   --  3.5  --    Liver Function Tests: Recent Labs  Lab 10/07/22 1050 10/08/22 0545  AST 38 26  ALT 33 27  ALKPHOS 57 55  BILITOT 0.8 1.1  PROT 6.1* 5.5*  ALBUMIN 2.9* 2.7*   No results for input(s): "LIPASE", "AMYLASE" in the last 168 hours. No results for input(s): "AMMONIA" in the last 168 hours. CBC: Recent Labs  Lab 10/07/22 1050 10/08/22 0545 10/12/22 0500  WBC 4.1 4.1 3.9*  HGB 13.1 11.9* 10.4*  HCT 40.3 37.2 31.4*  MCV 86.1 85.5 84.9  PLT 236 193 126*   Cardiac Enzymes: No results for input(s): "CKTOTAL", "CKMB", "CKMBINDEX", "TROPONINI" in the last 168 hours. BNP: Invalid input(s): "POCBNP" CBG: No results for input(s): "GLUCAP" in the last 168 hours. D-Dimer No results for input(s): "DDIMER" in the last 72 hours. Hgb A1c No results for input(s): "HGBA1C" in the last 72 hours. Lipid Profile No results for input(s): "CHOL", "HDL", "LDLCALC", "TRIG", "CHOLHDL", "LDLDIRECT" in the last 72 hours. Thyroid function studies No results for input(s): "TSH", "T4TOTAL", "T3FREE", "THYROIDAB" in the last 72 hours.  Invalid input(s): "FREET3" Anemia work up No results for input(s): "VITAMINB12", "FOLATE", "FERRITIN", "TIBC", "IRON",  "RETICCTPCT" in the last 72 hours. Urinalysis    Component Value Date/Time   COLORURINE YELLOW 09/10/2022 1512   APPEARANCEUR CLEAR 09/10/2022 1512   LABSPEC 1.019 09/10/2022 1512   PHURINE 5.0 09/10/2022 1512   GLUCOSEU NEGATIVE 09/10/2022 1512   HGBUR NEGATIVE 09/10/2022 1512   BILIRUBINUR NEGATIVE 09/10/2022 1512   KETONESUR 5 (A) 09/10/2022 1512   PROTEINUR NEGATIVE 09/10/2022 1512   NITRITE NEGATIVE 09/10/2022 1512   LEUKOCYTESUR NEGATIVE 09/10/2022  1512   Sepsis Labs Recent Labs  Lab 10/07/22 1050 10/08/22 0545 10/12/22 0500  WBC 4.1 4.1 3.9*   Microbiology No results found for this or any previous visit (from the past 240 hour(s)).   Time coordinating discharge: Over 30 minutes  SIGNED:   Willeen Niece, MD  [facility] 10/13/2022, 1:40 PM Pager   If 7PM-7AM, please contact night-coverage

## 2022-10-13 NOTE — TOC Transition Note (Signed)
Transition of Care [facility]) - CM/SW Discharge Note   Patient Details  Name: [name] MRN: [id number] Date of Birth: Feb 12, 1951  [facility] CM/SW Contact:  Beckie Busing, RN Phone Number:[phone]  10/13/2022, 11:50 AM   Clinical Narrative:    Patient with discharge orders. No TOC needs noted.    Final next level of care: Home/Self Care Barriers to Discharge: No Barriers Identified   Patient Goals and CMS Choice   Choice offered to / list presented to : NA  Discharge Placement                         Discharge Plan and Services Additional resources added to the After Visit Summary for                  DME Arranged: N/A DME Agency: NA       HH Arranged: NA HH Agency: NA        Social Determinants of Health (SDOH) Interventions SDOH Screenings   Food Insecurity: No Food Insecurity (10/05/2022)  Housing: Low Risk  (10/05/2022)  Transportation Needs: No Transportation Needs (10/05/2022)  Utilities: Not At Risk (10/05/2022)  Alcohol Screen: Low Risk  (08/22/2022)  Depression (PHQ2-9): Medium Risk (08/22/2022)  Financial Resource Strain: Medium Risk (08/22/2022)  Physical Activity: Insufficiently Active (08/22/2022)  Social Connections: Moderately Isolated (08/22/2022)  Stress: No Stress Concern Present (08/22/2022)  Tobacco Use: Medium Risk (10/05/2022)     Readmission Risk Interventions    10/09/2022    9:30 PM  Readmission Risk Prevention Plan  Transportation Screening Complete  PCP or Specialist Appt within 5-7 Days Complete  Home Care Screening Complete  Medication Review (RN CM) Complete

## 2022-10-13 NOTE — Progress Notes (Signed)
Mobility Specialist - Progress Note   10/13/22 0932  Mobility  Activity Ambulated with assistance in hallway;Ambulated with assistance to bathroom  Level of Assistance Standby assist, set-up cues, supervision of patient - no hands on  Assistive Device Front wheel walker  Distance Ambulated (ft) 250 ft  Activity Response Tolerated well  Mobility Referral Yes  $Mobility charge 1 Mobility  Mobility Specialist Start Time (ACUTE ONLY) [id number]  Mobility Specialist Stop Time (ACUTE ONLY) 0931  Mobility Specialist Time Calculation (min) (ACUTE ONLY) 15 min   Pt received in bed and agreeable to mobility. Prior to ambulating, pt requested assistance to bathroom. No complaints during session. Pt to bed after session with all needs met.     [facility]

## 2022-10-13 NOTE — Discharge Instructions (Signed)
Advised to follow-up with gastroenterology as scheduled. Advised to take promethazine suppository for esophagitis. Advised to continue pantoprazole 40 mg every 12 hours.

## 2022-10-16 ENCOUNTER — Telehealth: Payer: Self-pay | Admitting: Medical Oncology

## 2022-10-16 ENCOUNTER — Inpatient Hospital Stay (HOSPITAL_COMMUNITY)
Admission: EM | Admit: 2022-10-16 | Discharge: 2022-10-19 | DRG: 392 | Disposition: A | Discharge status: Home / Self Care | Payer: Medicare HMO | Attending: Internal Medicine | Admitting: Internal Medicine

## 2022-10-16 ENCOUNTER — Inpatient Hospital Stay: Payer: Medicare HMO

## 2022-10-16 ENCOUNTER — Telehealth: Payer: Self-pay

## 2022-10-16 ENCOUNTER — Other Ambulatory Visit: Payer: Self-pay

## 2022-10-16 ENCOUNTER — Encounter: Payer: Self-pay | Admitting: *Deleted

## 2022-10-16 ENCOUNTER — Emergency Department (HOSPITAL_COMMUNITY): Payer: Medicare HMO

## 2022-10-16 ENCOUNTER — Telehealth: Payer: Self-pay | Admitting: *Deleted

## 2022-10-16 DIAGNOSIS — R110 Nausea: Secondary | ICD-10-CM | POA: Diagnosis not present

## 2022-10-16 DIAGNOSIS — T451X5A Adverse effect of antineoplastic and immunosuppressive drugs, initial encounter: Secondary | ICD-10-CM | POA: Diagnosis present

## 2022-10-16 DIAGNOSIS — Z8616 Personal history of COVID-19: Secondary | ICD-10-CM

## 2022-10-16 DIAGNOSIS — C779 Secondary and unspecified malignant neoplasm of lymph node, unspecified: Secondary | ICD-10-CM | POA: Diagnosis not present

## 2022-10-16 DIAGNOSIS — K2100 Gastro-esophageal reflux disease with esophagitis, without bleeding: Secondary | ICD-10-CM | POA: Diagnosis not present

## 2022-10-16 DIAGNOSIS — T50995A Adverse effect of other drugs, medicaments and biological substances, initial encounter: Secondary | ICD-10-CM | POA: Diagnosis present

## 2022-10-16 DIAGNOSIS — I959 Hypotension, unspecified: Secondary | ICD-10-CM | POA: Diagnosis not present

## 2022-10-16 DIAGNOSIS — D630 Anemia in neoplastic disease: Secondary | ICD-10-CM | POA: Diagnosis present

## 2022-10-16 DIAGNOSIS — G893 Neoplasm related pain (acute) (chronic): Secondary | ICD-10-CM | POA: Diagnosis present

## 2022-10-16 DIAGNOSIS — Z833 Family history of diabetes mellitus: Secondary | ICD-10-CM | POA: Diagnosis not present

## 2022-10-16 DIAGNOSIS — Z807 Family history of other malignant neoplasms of lymphoid, hematopoietic and related tissues: Secondary | ICD-10-CM

## 2022-10-16 DIAGNOSIS — R000 Tachycardia, unspecified: Secondary | ICD-10-CM | POA: Diagnosis not present

## 2022-10-16 DIAGNOSIS — F419 Anxiety disorder, unspecified: Secondary | ICD-10-CM | POA: Diagnosis not present

## 2022-10-16 DIAGNOSIS — J449 Chronic obstructive pulmonary disease, unspecified: Secondary | ICD-10-CM | POA: Diagnosis present

## 2022-10-16 DIAGNOSIS — E785 Hyperlipidemia, unspecified: Secondary | ICD-10-CM | POA: Diagnosis not present

## 2022-10-16 DIAGNOSIS — Z8249 Family history of ischemic heart disease and other diseases of the circulatory system: Secondary | ICD-10-CM | POA: Diagnosis not present

## 2022-10-16 DIAGNOSIS — E876 Hypokalemia: Secondary | ICD-10-CM | POA: Diagnosis not present

## 2022-10-16 DIAGNOSIS — Z9841 Cataract extraction status, right eye: Secondary | ICD-10-CM | POA: Diagnosis not present

## 2022-10-16 DIAGNOSIS — Z888 Allergy status to other drugs, medicaments and biological substances status: Secondary | ICD-10-CM | POA: Diagnosis not present

## 2022-10-16 DIAGNOSIS — Y842 Radiological procedure and radiotherapy as the cause of abnormal reaction of the patient, or of later complication, without mention of misadventure at the time of the procedure: Secondary | ICD-10-CM | POA: Diagnosis present

## 2022-10-16 DIAGNOSIS — E860 Dehydration: Secondary | ICD-10-CM | POA: Diagnosis present

## 2022-10-16 DIAGNOSIS — Z885 Allergy status to narcotic agent status: Secondary | ICD-10-CM

## 2022-10-16 DIAGNOSIS — I700 Atherosclerosis of aorta: Secondary | ICD-10-CM | POA: Diagnosis not present

## 2022-10-16 DIAGNOSIS — R1084 Generalized abdominal pain: Secondary | ICD-10-CM | POA: Diagnosis not present

## 2022-10-16 DIAGNOSIS — Z83438 Family history of other disorder of lipoprotein metabolism and other lipidemia: Secondary | ICD-10-CM

## 2022-10-16 DIAGNOSIS — Z902 Acquired absence of lung [part of]: Secondary | ICD-10-CM

## 2022-10-16 DIAGNOSIS — Z87891 Personal history of nicotine dependence: Secondary | ICD-10-CM | POA: Diagnosis not present

## 2022-10-16 DIAGNOSIS — Z923 Personal history of irradiation: Secondary | ICD-10-CM

## 2022-10-16 DIAGNOSIS — Z79899 Other long term (current) drug therapy: Secondary | ICD-10-CM

## 2022-10-16 DIAGNOSIS — Z9842 Cataract extraction status, left eye: Secondary | ICD-10-CM

## 2022-10-16 DIAGNOSIS — I2510 Atherosclerotic heart disease of native coronary artery without angina pectoris: Secondary | ICD-10-CM | POA: Diagnosis present

## 2022-10-16 DIAGNOSIS — R112 Nausea with vomiting, unspecified: Secondary | ICD-10-CM | POA: Diagnosis not present

## 2022-10-16 DIAGNOSIS — C3490 Malignant neoplasm of unspecified part of unspecified bronchus or lung: Secondary | ICD-10-CM | POA: Diagnosis not present

## 2022-10-16 DIAGNOSIS — C3491 Malignant neoplasm of unspecified part of right bronchus or lung: Secondary | ICD-10-CM | POA: Diagnosis present

## 2022-10-16 DIAGNOSIS — M62838 Other muscle spasm: Secondary | ICD-10-CM | POA: Diagnosis present

## 2022-10-16 DIAGNOSIS — R54 Age-related physical debility: Secondary | ICD-10-CM | POA: Diagnosis present

## 2022-10-16 DIAGNOSIS — R0902 Hypoxemia: Secondary | ICD-10-CM | POA: Diagnosis not present

## 2022-10-16 DIAGNOSIS — J439 Emphysema, unspecified: Secondary | ICD-10-CM | POA: Diagnosis not present

## 2022-10-16 LAB — URINALYSIS, ROUTINE W REFLEX MICROSCOPIC
Bilirubin Urine: NEGATIVE
Glucose, UA: NEGATIVE mg/dL
Ketones, ur: 5 mg/dL — AB
Nitrite: NEGATIVE
Protein, ur: NEGATIVE mg/dL
Specific Gravity, Urine: 1.008 (ref 1.005–1.030)
pH: 8 (ref 5.0–8.0)

## 2022-10-16 LAB — CBC WITH DIFFERENTIAL/PLATELET
Abs Immature Granulocytes: 0.05 10*3/uL (ref 0.00–0.07)
Basophils Absolute: 0 10*3/uL (ref 0.0–0.1)
Basophils Relative: 0 %
Eosinophils Absolute: 0.1 10*3/uL (ref 0.0–0.5)
Eosinophils Relative: 1 %
HCT: 34.3 % — ABNORMAL LOW (ref 36.0–46.0)
Hemoglobin: 11.1 g/dL — ABNORMAL LOW (ref 12.0–15.0)
Immature Granulocytes: 1 %
Lymphocytes Relative: 9 %
Lymphs Abs: 0.6 10*3/uL — ABNORMAL LOW (ref 0.7–4.0)
MCH: 28.3 pg (ref 26.0–34.0)
MCHC: 32.4 g/dL (ref 30.0–36.0)
MCV: 87.5 fL (ref 80.0–100.0)
Monocytes Absolute: 1.1 10*3/uL — ABNORMAL HIGH (ref 0.1–1.0)
Monocytes Relative: 17 %
Neutro Abs: 4.7 10*3/uL (ref 1.7–7.7)
Neutrophils Relative %: 72 %
Platelets: 177 10*3/uL (ref 150–400)
RBC: 3.92 MIL/uL (ref 3.87–5.11)
RDW: 23 % — ABNORMAL HIGH (ref 11.5–15.5)
WBC: 6.5 10*3/uL (ref 4.0–10.5)
nRBC: 0 % (ref 0.0–0.2)

## 2022-10-16 LAB — LIPASE, BLOOD: Lipase: 35 U/L (ref 11–51)

## 2022-10-16 LAB — COMPREHENSIVE METABOLIC PANEL
ALT: 16 U/L (ref 0–44)
AST: 18 U/L (ref 15–41)
Albumin: 2.4 g/dL — ABNORMAL LOW (ref 3.5–5.0)
Alkaline Phosphatase: 56 U/L (ref 38–126)
Anion gap: 15 (ref 5–15)
BUN: 6 mg/dL — ABNORMAL LOW (ref 8–23)
CO2: 26 mmol/L (ref 22–32)
Calcium: 8.1 mg/dL — ABNORMAL LOW (ref 8.9–10.3)
Chloride: 94 mmol/L — ABNORMAL LOW (ref 98–111)
Creatinine, Ser: 0.75 mg/dL (ref 0.44–1.00)
GFR, Estimated: >60 mL/min (ref >60)
Glucose, Bld: 112 mg/dL — ABNORMAL HIGH (ref 70–99)
Potassium: 3.6 mmol/L (ref 3.5–5.1)
Sodium: 135 mmol/L (ref 135–145)
Total Bilirubin: 0.8 mg/dL (ref 0.3–1.2)
Total Protein: 5.4 g/dL — ABNORMAL LOW (ref 6.5–8.1)

## 2022-10-16 MED ORDER — SUCRALFATE 1 G PO TABS
1.0000 g | ORAL_TABLET | Freq: Three times a day (TID) | ORAL | Status: DC
  Administered 2022-10-17 – 2022-10-19 (×4): 1 g via ORAL
  Filled 2022-10-16 (×6): qty 1

## 2022-10-16 MED ORDER — ENOXAPARIN SODIUM 40 MG/0.4ML IJ SOSY
40.0000 mg | PREFILLED_SYRINGE | INTRAMUSCULAR | Status: DC
  Administered 2022-10-16 – 2022-10-18 (×2): 40 mg via SUBCUTANEOUS
  Filled 2022-10-16 (×3): qty 0.4

## 2022-10-16 MED ORDER — ALBUTEROL SULFATE (2.5 MG/3ML) 0.083% IN NEBU: 2.5000 mg | INHALATION_SOLUTION | RESPIRATORY_TRACT | Status: DC | PRN

## 2022-10-16 MED ORDER — HYDROCODONE-ACETAMINOPHEN 7.5-325 MG/15ML PO SOLN: 5.0000 mL | ORAL | Status: DC | PRN

## 2022-10-16 MED ORDER — LACTATED RINGERS IV SOLN: INTRAVENOUS | Status: DC

## 2022-10-16 MED ORDER — SODIUM CHLORIDE 0.9% FLUSH
10.0000 mL | Freq: Two times a day (BID) | INTRAVENOUS | Status: DC
  Administered 2022-10-16 – 2022-10-19 (×5): 10 mL

## 2022-10-16 MED ORDER — ROSUVASTATIN CALCIUM 5 MG PO TABS
5.0000 mg | ORAL_TABLET | Freq: Every day | ORAL | Status: DC
  Filled 2022-10-16 (×2): qty 1

## 2022-10-16 MED ORDER — ONDANSETRON HCL 4 MG PO TABS
4.0000 mg | ORAL_TABLET | Freq: Four times a day (QID) | ORAL | Status: DC | PRN
  Administered 2022-10-18: 4 mg via ORAL
  Filled 2022-10-16: qty 1

## 2022-10-16 MED ORDER — SODIUM CHLORIDE 0.9 % IV SOLN: INTRAVENOUS | Status: DC

## 2022-10-16 MED ORDER — SODIUM CHLORIDE 0.9% FLUSH: 10.0000 mL | INTRAVENOUS | Status: DC | PRN

## 2022-10-16 MED ORDER — TRAZODONE HCL 50 MG PO TABS
25.0000 mg | ORAL_TABLET | Freq: Every evening | ORAL | Status: DC | PRN
  Administered 2022-10-18: 25 mg via ORAL
  Filled 2022-10-16 (×2): qty 1

## 2022-10-16 MED ORDER — UMECLIDINIUM BROMIDE 62.5 MCG/ACT IN AEPB
1.0000 | INHALATION_SPRAY | Freq: Every day | RESPIRATORY_TRACT | Status: DC
  Filled 2022-10-16: qty 7

## 2022-10-16 MED ORDER — CYCLOBENZAPRINE HCL 5 MG PO TABS: 5.0000 mg | ORAL_TABLET | Freq: Three times a day (TID) | ORAL | Status: DC | PRN

## 2022-10-16 MED ORDER — PROCHLORPERAZINE 25 MG RE SUPP
25.0000 mg | Freq: Two times a day (BID) | RECTAL | Status: DC
  Administered 2022-10-16 – 2022-10-17 (×2): 25 mg via RECTAL
  Filled 2022-10-16 (×3): qty 1

## 2022-10-16 MED ORDER — ALUM & MAG HYDROXIDE-SIMETH 200-200-20 MG/5ML PO SUSP: 15.0000 mL | Freq: Four times a day (QID) | ORAL | Status: DC | PRN

## 2022-10-16 MED ORDER — LACTATED RINGERS IV BOLUS
2000.0000 mL | Freq: Once | INTRAVENOUS | Status: AC
  Administered 2022-10-16: 2000 mL via INTRAVENOUS

## 2022-10-16 MED ORDER — DIPHENHYDRAMINE HCL 50 MG/ML IJ SOLN
12.5000 mg | Freq: Once | INTRAMUSCULAR | Status: AC
  Administered 2022-10-16: 12.5 mg via INTRAVENOUS
  Filled 2022-10-16: qty 1

## 2022-10-16 MED ORDER — HYDROMORPHONE HCL 1 MG/ML IJ SOLN: 0.5000 mg | INTRAMUSCULAR | Status: DC | PRN

## 2022-10-16 MED ORDER — LORAZEPAM 2 MG/ML IJ SOLN: 1.0000 mg | Freq: Four times a day (QID) | INTRAMUSCULAR | Status: DC | PRN

## 2022-10-16 MED ORDER — FLUTICASONE FUROATE-VILANTEROL 100-25 MCG/ACT IN AEPB
1.0000 | INHALATION_SPRAY | Freq: Every day | RESPIRATORY_TRACT | Status: DC
  Filled 2022-10-16: qty 28

## 2022-10-16 MED ORDER — PROCHLORPERAZINE EDISYLATE 10 MG/2ML IJ SOLN
10.0000 mg | INTRAMUSCULAR | Status: AC
  Administered 2022-10-16: 10 mg via INTRAVENOUS
  Filled 2022-10-16: qty 2

## 2022-10-16 MED ORDER — ALPRAZOLAM 0.5 MG PO TABS: 0.5000 mg | ORAL_TABLET | Freq: Three times a day (TID) | ORAL | Status: DC | PRN

## 2022-10-16 MED ORDER — SODIUM CHLORIDE 0.9 % IV SOLN
12.5000 mg | Freq: Four times a day (QID) | INTRAVENOUS | Status: DC | PRN
  Administered 2022-10-18: 12.5 mg via INTRAVENOUS
  Filled 2022-10-16: qty 0.5
  Filled 2022-10-16: qty 12.5

## 2022-10-16 MED ORDER — ONDANSETRON HCL 4 MG/2ML IJ SOLN
4.0000 mg | Freq: Four times a day (QID) | INTRAMUSCULAR | Status: DC | PRN
  Administered 2022-10-18 (×2): 4 mg via INTRAVENOUS
  Filled 2022-10-16 (×3): qty 2

## 2022-10-16 MED ORDER — CHLORHEXIDINE GLUCONATE CLOTH 2 % EX PADS
6.0000 | MEDICATED_PAD | Freq: Every day | CUTANEOUS | Status: DC
  Administered 2022-10-17 – 2022-10-19 (×3): 6 via TOPICAL

## 2022-10-16 NOTE — Telephone Encounter (Signed)
Schedule for hospital follow up

## 2022-10-16 NOTE — Telephone Encounter (Signed)
Called patient to schedule Hospital follow up, Daughter [name]) got on the phone and stated that the patient has been out the hospital since Friday and every thing she eats, and drink she is not able to keep it down.   Per [name], PA-C: Recommend patient go to the hospital due to her not being able to keep anything down, she needs to be seen asap.

## 2022-10-16 NOTE — Telephone Encounter (Signed)
"   Dtr called " Geneive is sick again" Weekend Update-D/C Friday -Denied N/V or burning in throat at all.  Sat-  ate broccoli soup then got " nauseated and vomited" > She described vomiting as dry heaves and small amt of yellowish bile. Small amt "Yellowish liquid " each time.  Got bathed w assist -very weak.  Sun- same sx. Took Compazine with some relief from nausea.   Appts Today -phone visit with dietician Wed-labs and scan-on Wed.  Dtr asking for appt with a provider this week.   LBM - thursday - Dtr will give her senakot today and again FF.   Skin " sore " -dtr reported she has one on her buttock .   Port a cath while in hospital. Pt worried about how to care for it. I told her leave it alone and keep bandaid over it until we see her next.  Depression - Dtr stated " I think she is depressed".

## 2022-10-16 NOTE — Progress Notes (Signed)
Nutrition  Patient scheduled for telephone nutrition visit today.  Called patient but no answer.    Chart reviewed.  Noted patient is in ED at [facility] due to nausea and vomiting and unable to take potassium pill.     Cyntha Brickman B. Freida Busman, RD, LDN Registered Dietitian [phone]

## 2022-10-16 NOTE — H&P (Addendum)
History and Physical  [name] WUJ:[id number] DOB: [birth year] DOA: 10/16/2022  PCP: [name], PA-C   Chief Complaint: nausea   HPI: [name] is a 72 y.o. female with medical history significant for GERD, hiatal hernia, hyperlipidemia, non-small cell lung cancer status post final radiation 09/25/2022 and ongoing chemotherapy with carboplatin and paclitaxel under the care of Dr. Arbutus Ped who is being readmitted to the hospital with intractable nausea and vomiting.  [name] was recently admitted to the hospital 8/15 to 8/23 due to similar symptoms.  During that hospital stay, [name] had upper endoscopy with gastroenterology Dr. Chales Abrahams which showed proximal and mild esophagitis with no bleeding.  Biopsies were taken.  During that hospital stay, [name] also had Port-A-Cath placed by IR.  Per the patient, [name] [name], and [name] who are [name] caregivers, when [name] was discharged from the hospital [name] was tolerating soups and other oral intake without significant symptoms.  Patient tells me that [name] burning epigastric discomfort and sensation of food getting stuck in the bottom of [name] throat have resolved.  However [name] has continued nausea, [name] spend the night with [name] last night said that [name] had continuous retching all night despite no oral intake.  Family thinks [name] probably has not had a bowel movement since discharge from the hospital.  Has not been able to keep down [name] pills, or any liquids since being discharged from the hospital.  Due to [name] continued symptoms, [name] contacted the oncology clinic, was advised to come to the ER for further evaluation.  Here in the emergency department, vital signs have been stable, [name] has been afebrile.  Lab work including CBC and CMP are relatively unremarkable.  [name] has stable anemia, normal renal function and electrolytes.  Due to continued abdominal pain, CT of the abdomen pelvis was obtained, which did not show any acute findings.  Review of  Systems: Please see HPI for pertinent positives and negatives. A complete 10 system review of systems are otherwise negative.  Past Medical History:  Diagnosis Date   Abnormal chest x-ray 01/19/2020   Acute laryngopharyngitis 03/18/2020   Aortic atherosclerosis (HCC) 01/28/2020   Arthritis    Cardiac murmur 03/01/2020   COPD (chronic obstructive pulmonary disease) (HCC)    Coronary artery calcification seen on CT scan 03/01/2020   Cough 01/12/2020   COVID-19 03/10/2020   Dyspnea    on exertion,after getting covid   Family history of adverse reaction to anesthesia    brothr had n/v   GERD (gastroesophageal reflux disease)    Hiatal hernia    Hyperlipemia    Lung cancer, middle lobe (HCC)    middle lobe removed   Pneumonia    PONV (postoperative nausea and vomiting)    Pre-operative cardiovascular examination 03/01/2020   Right lower lobe lung mass 01/28/2020   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   Visit for screening mammogram 01/28/2020   Past Surgical History:  Procedure Laterality Date   BIOPSY  10/07/2022   Procedure: BIOPSY;  Surgeon: Lynann Bologna, MD;  Location: [facility];  Service: Gastroenterology;;   CATARACT EXTRACTION Bilateral 2023   ESOPHAGOGASTRODUODENOSCOPY (EGD) WITH PROPOFOL N/A 10/07/2022   Procedure: ESOPHAGOGASTRODUODENOSCOPY (EGD) WITH PROPOFOL;  Surgeon: Lynann Bologna, MD;  Location: [facility];  Service: Gastroenterology;  Laterality: N/A;   IR IMAGING GUIDED PORT INSERTION  10/10/2022   IR US GUIDE VASC ACCESS RIGHT  10/10/2022   IR VENIPUNCTURE 13YRS/OLDER BY MD  10/10/2022   LUNG REMOVAL, PARTIAL  03/29/2020   middle  lobe removed   VIDEO BRONCHOSCOPY WITH ENDOBRONCHIAL ULTRASOUND N/A 07/20/2022   Procedure: VIDEO BRONCHOSCOPY WITH ENDOBRONCHIAL ULTRASOUND;  Surgeon: [name], MD;  Location: [facility];  Service: Thoracic;  Laterality: N/A;    Social History:  reports that [name] quit smoking about 5 years ago. [name] smoking use  included cigarettes. [name] started smoking about 55 years ago. [name] has never used smokeless tobacco. [name] reports that [name] does not drink alcohol and does not use drugs.   Allergies  Allergen Reactions   Codeine Hives   Surgical Lubricant Hives    Family History  Problem Relation Age of Onset   Heart disease Mother    Heart attack Mother    Cancer Father    Diabetes Father    Non-Hodgkin's lymphoma Brother    High Cholesterol Brother    High Cholesterol Brother    Heart disease Brother    Heart attack Brother      Prior to Admission medications   Medication Sig Start Date End Date Taking? Authorizing Provider  alum & mag hydroxide-simeth (MAALOX MAX) 400-400-40 MG/5ML suspension Take 15 mLs by mouth every 6 (six) hours as needed for indigestion. 09/10/22   Lonell Grandchild, MD  Coenzyme Q10 (CO Q-10 PO) Take 1 tablet by mouth daily.    [provider]  cyclobenzaprine (FLEXERIL) 5 MG tablet Take 1 tablet (5 mg total) by mouth 3 (three) times daily as needed for muscle spasms. 04/13/22   [name], PA-C  Fluticasone-Umeclidin-Vilant (TRELEGY ELLIPTA) 100-62.5-25 MCG/ACT AEPB Inhale 1 puff into the lungs daily. Patient taking differently: Inhale 1 puff into the lungs daily as needed (For shortness of breath). 05/06/21   Janie Morning, NP  Homeopathic Products (LEG CRAMPS) SUBL Place 2 tablets under the tongue daily as needed (leg cramps).    [provider]  HYDROcodone-acetaminophen (HYCET) 7.5-325 mg/15 ml solution Take 5-10 mLs by mouth every 4 (four) hours as needed for moderate pain. 09/14/22   Ronny Bacon, PA-C  lidocaine (XYLOCAINE) 2 % solution Use as directed 15 mLs in the mouth or throat every 6 (six) hours as needed for mouth pain. 09/10/22   Lonell Grandchild, MD  montelukast (SINGULAIR) 10 MG tablet TAKE 1 TABLET AT BEDTIME Patient taking differently: Take 10 mg by mouth at bedtime. 02/02/22   [name], PA-C  Multiple Vitamin  (MULTIVITAMIN ADULT PO) Take 2 tablets by mouth daily.    [provider]  mupirocin ointment (BACTROBAN) 2 % Apply 1 application topically 2 (two) times daily. Patient taking differently: Apply 1 application  topically 2 (two) times daily as needed (wound care). 12/10/20   [name], PA-C  ondansetron (ZOFRAN-ODT) 8 MG disintegrating tablet Take 1 tablet (8 mg total) by mouth every 8 (eight) hours as needed for nausea or vomiting. 09/18/22   Heilingoetter, Cassandra L, PA-C  pantoprazole (PROTONIX) 40 MG tablet Take 1 tablet (40 mg total) by mouth 2 (two) times daily. Patient not taking: Reported on 10/16/2022 10/13/22 11/12/22  Willeen Niece, MD  polyethylene glycol (MIRALAX / GLYCOLAX) 17 g packet Take 17 g by mouth daily as needed for moderate constipation.    [provider]  potassium chloride SA (KLOR-CON M) 20 MEQ tablet Take 1 tablet (20 mEq total) by mouth daily. Patient not taking: Reported on 10/16/2022 09/18/22   Heilingoetter, Cassandra L, PA-C  prochlorperazine (COMPAZINE) 25 MG suppository Place 1 suppository (25 mg total) rectally every 12 (twelve) hours. 10/13/22   Willeen Niece, MD  promethazine (PHENERGAN) 25 MG suppository Place 1 suppository (25 mg total) rectally every 8 (eight) hours as needed for nausea or vomiting. 09/25/22   Si Gaul, MD  rosuvastatin (CRESTOR) 5 MG tablet TAKE 1 TABLET EVERY DAY Patient taking differently: Take 5 mg by mouth at bedtime. 02/02/22   [name], PA-C  sucralfate (CARAFATE) 1 g tablet Take 1 tablet (1 g total) by mouth 4 (four) times daily. Dissolve each tablet in 15 cc water before use. Patient taking differently: Take 1 g by mouth 4 (four) times daily. 08/18/22   Dorothy Puffer, MD  VITAMIN D PO Take 2 tablets by mouth daily.    [provider]    Physical Exam: BP (!) 150/74   Pulse 95   Temp 97.8 F (36.6 C) (Oral)   Resp 16   Ht 5\' 7"  (1.702 m)   Wt 59.9 kg   SpO2 97%   BMI 20.67 kg/m   General:   Alert, oriented, calm, in no acute distress, [name] and [name] at the bedside.  Right anterior chest port in place, not currently accessed. Eyes: EOMI, clear conjuctivae, white sclerea Neck: supple, no masses, trachea mildline  Cardiovascular: RRR, no murmurs or rubs, no peripheral edema  Respiratory: clear to auscultation bilaterally, no wheezes, no crackles  Abdomen: soft, nontender, nondistended, normal bowel tones heard  Skin: dry, no rashes  Musculoskeletal: no joint effusions, normal range of motion  Psychiatric: appropriate affect, normal speech  Neurologic: extraocular muscles intact, clear speech, moving all extremities with intact sensorium          Labs on Admission:  Basic Metabolic Panel: Recent Labs  Lab 10/10/22 0557 10/11/22 0430 10/12/22 0500 10/13/22 0516 10/16/22 1449  NA 133* 132* 132* 130* 135  K 3.3* 3.6 3.7 3.9 3.6  CL 104 101 100 94* 94*  CO2 19* 22 25 27 26   GLUCOSE 97 103* 114* 103* 112*  BUN <5* <5* <5* <5* 6*  CREATININE 0.66 0.62 0.69 0.63 0.75  CALCIUM 8.3* 8.2* 8.1* 8.0* 8.1*  MG  --   --  1.2* 1.7  --   PHOS  --   --  3.5  --   --    Liver Function Tests: Recent Labs  Lab 10/16/22 1449  AST 18  ALT 16  ALKPHOS 56  BILITOT 0.8  PROT 5.4*  ALBUMIN 2.4*   Recent Labs  Lab 10/16/22 1449  LIPASE 35   No results for input(s): "AMMONIA" in the last 168 hours. CBC: Recent Labs  Lab 10/12/22 0500 10/16/22 1449  WBC 3.9* 6.5  NEUTROABS  --  4.7  HGB 10.4* 11.1*  HCT 31.4* 34.3*  MCV 84.9 87.5  PLT 126* 177   Cardiac Enzymes: No results for input(s): "CKTOTAL", "CKMB", "CKMBINDEX", "TROPONINI" in the last 168 hours.  BNP (last 3 results) No results for input(s): "BNP" in the last 8760 hours.  ProBNP (last 3 results) No results for input(s): "PROBNP" in the last 8760 hours.  CBG: No results for input(s): "GLUCAP" in the last 168 hours.  Radiological Exams on Admission: CT ABDOMEN PELVIS WO CONTRAST  Result Date:  10/16/2022 CLINICAL DATA:  Abdominal pain, acute, nonlocalized. Nausea and vomiting. Diffuse abdominal pain. EXAM: CT ABDOMEN AND PELVIS WITHOUT CONTRAST TECHNIQUE: Multidetector CT imaging of the abdomen and pelvis was performed following the standard protocol without IV contrast. RADIATION DOSE REDUCTION: This exam was performed according to the departmental dose-optimization program which includes automated exposure control, adjustment of the mA and/or kV  according to patient size and/or use of iterative reconstruction technique. COMPARISON:  CT abdomen/pelvis 09/10/2022. FINDINGS: Lower chest: No acute findings.  Coronary artery calcifications. Hepatobiliary: No focal liver abnormality is seen. No gallstones, gallbladder wall thickening, or biliary dilatation. Pancreas: Unremarkable. No pancreatic ductal dilatation or surrounding inflammatory changes. Spleen: Normal in size without focal abnormality. Adrenals/Urinary Tract: Adrenal glands are unremarkable. Kidneys are normal, without renal calculi, focal lesion, or hydronephrosis. Bladder is unremarkable. Stomach/Bowel: Stomach is within normal limits. Appendix appears normal. No evidence of bowel wall thickening, distention, or inflammatory changes. Vascular/Lymphatic: Aortic atherosclerosis. No enlarged abdominal or pelvic lymph nodes. Reproductive: Uterus and bilateral adnexa are unremarkable. Vaginal pessary in place. Other: No abdominal wall hernia or abnormality. No abdominopelvic ascites. Musculoskeletal: No acute or significant osseous findings. IMPRESSION: 1. No acute abdominopelvic findings. 2. Coronary artery calcifications. Aortic Atherosclerosis (ICD10-I70.0). Electronically Signed   By: Orvan Falconer M.D.   On: 10/16/2022 18:17    Assessment/Plan Leonie Attar is a 72 y.o. female with medical history significant for GERD, hiatal hernia, hyperlipidemia, non-small cell lung cancer status post final radiation 09/25/2022 and ongoing chemotherapy with  carboplatin and paclitaxel under the care of Dr. Arbutus Ped who is being admitted to the hospital with continued intractable nausea vomiting, despite recent upper endoscopy showing only gastritis.   Intractable vomiting-with improved burning epigastric discomfort, and vomiting with all p.o. intake.  Initially felt on presentation on 8/15 [name] symptoms may be due to severe acid reflux/gastritis.  However patient now states that [name] reflux symptoms are improved, but [name] intractable vomiting continues.  Initially thought related to constipation, though CT scan tonight does not show significant stool burden.  Abdomen is nonacute, nontender, nondistended.  -Observation admission -IV fluids -IV PPI daily -Sucralfate suspension with meals - Discussed with floor nurse, who happens to have [name] last week and mentions that this regimen works for [name]: Scheduled PR Compazine, as needed IV Phenergan, IV Ativan as needed.  Will place [name] on this regimen. -Clear liquid diet, plan to advance as tolerated -Ultimately will need to figure out a regimen that manages [name] nausea at home, or [name] may eventually need feeding tube  Anxiety-family at bedside states that receiving Xanax during [name] last hospital admission seem to ease [name] nausea/vomiting.  There could be a psychogenic component to [name] continued p.o. intolerance. -Low-dose Xanax as needed  GERD-IV PPI   Muscle spasms-Flexeril as needed   Cancer pain-continue Hycet as needed  DVT prophylaxis: Lovenox     Code Status: Full Code  Consults called: None  Admission status: Observation  Time spent: 46 minutes  Aaleigha Bozza Sharlette Dense MD [facility] Pager [phone]  If 7PM-7AM, please contact night-coverage [email or web address] Password [facility]  10/16/2022, 8:13 PM

## 2022-10-16 NOTE — ED Triage Notes (Signed)
Pt arrived via EMS, from home. Recent admission, discharged on 8/23, has not been able to tolerate POs at all since being home. Spoke with oncology and told to come to ED for evaluation.     130s HR   18 G R AC 4 mg zofran  700 NS

## 2022-10-16 NOTE — Telephone Encounter (Signed)
EMS took pt to ED> . She is "drained from gagging and it is making it hard to breath". She has no appetite.  Dtr thinks her symptoms may be in part related to some depression. Dtr said when she was in the hospital [place] ate well in the hospital.  [name]  thinks [name]  received something to relax her while she was in the hospital and that helped her eat.

## 2022-10-16 NOTE — Transitions of Care (Post Inpatient/ED Visit) (Signed)
10/16/2022  Name: [name] MRN: [id number] DOB: [birth year]  Today's TOC FU Call Status: Today's TOC FU Call Status:: Successful TOC FU Call Completed TOC FU Call Complete Date: 10/16/22 Patient's Name and Date of Birth confirmed.  Transition Care Management Follow-up Telephone Call Date of Discharge: 10/13/22 Discharge Facility: [facility]) Type of Discharge: Inpatient Admission Primary Inpatient Discharge Diagnosis:: hypokalemia; endoscopy How have you been since you were released from the hospital?: Same ("I am not doing great, still having nausea and vomiting, and can't take my medicine because of it.  I can't take the potassium pills anyway-- they are just too big for me to swallow") Any questions or concerns?: Yes Patient Questions/Concerns:: Ongoing nausea/ vominting post-hospital discharge: reports unable to take medication Patient Questions/Concerns Addressed: Other:, Notified Provider of Patient Questions/Concerns (provided education around importance of taking medications as provided, with strategies/ options to assist in getting K+ medication in patient; encouraged patient to promptly ensure she is seen by one of her providers/ discuss with oncology nutritionist)  Items Reviewed: Did you receive and understand the discharge instructions provided?: Yes (thoroughly reviewed with patient and daughter/ caregiver who verbalizes good understanding of same) Medications obtained,verified, and reconciled?: Partial Review Completed (Partial review completed; no concerns or discrepancies identified; self-manages medications and reports questions/ concerns around medications today: PCP group notified accordingly) Reason for Partial Mediation Review: Patient reports too nauseated to review all medications: reports unable to take medications consistently due to ongoing nausea/ vomiting Any new allergies since your discharge?: No Dietary orders reviewed?: Yes Type of Diet Ordered::  "Whatever I can get down, which is not much" Do you have support at home?: Yes People in Home: child(ren), adult, grandchild(ren) Name of Support/Comfort Primary Source: Reports essentially independent in self-care activities; resides with supportive granddaughter who assists as/ if needed/ indicated- daughter is local and also assists regularly in patient's care  Medications Reviewed Today: Medications Reviewed Today     Reviewed by Michaela Corner, RN (Registered Nurse) on 10/16/22 at 1124  Med List Status: <None>   Medication Order Taking? Sig Documenting Provider Last Dose Status Informant  alum & mag hydroxide-simeth (MAALOX MAX) 400-400-40 MG/5ML suspension [id number]  Take 15 mLs by mouth every 6 (six) hours as needed for indigestion. Lonell Grandchild, MD  Active Self  Coenzyme Q10 (CO Q-10 PO) [id number]  Take 1 tablet by mouth daily. [provider]  Active Self  cyclobenzaprine (FLEXERIL) 5 MG tablet [id number]  Take 1 tablet (5 mg total) by mouth 3 (three) times daily as needed for muscle spasms. [name], PA-C  Active Self  Fluticasone-Umeclidin-Vilant (TRELEGY ELLIPTA) 100-62.5-25 MCG/ACT AEPB [id number]  Inhale 1 puff into the lungs daily.  Patient taking differently: Inhale 1 puff into the lungs daily as needed (For shortness of breath).   Janie Morning, NP  Active Self  Homeopathic Products (LEG CRAMPS) Johnnye Sima [id number]  Place 2 tablets under the tongue daily as needed (leg cramps). [provider]  Active Self  HYDROcodone-acetaminophen (HYCET) 7.5-325 mg/15 ml solution [id number]  Take 5-10 mLs by mouth every 4 (four) hours as needed for moderate pain. Ronny Bacon, PA-C  Active Self  lidocaine (XYLOCAINE) 2 % solution [id number]  Use as directed 15 mLs in the mouth or throat every 6 (six) hours as needed for mouth pain. Lonell Grandchild, MD  Active Self  montelukast (SINGULAIR) 10 MG tablet [id number]  TAKE 1 TABLET AT BEDTIME  Patient taking  differently: Take 10 mg by mouth  at bedtime.   [name], PA-C  Active Self  Multiple Vitamin (MULTIVITAMIN ADULT PO) [id number]  Take 2 tablets by mouth daily. [provider]  Active Self  mupirocin ointment (BACTROBAN) 2 % [id number]  Apply 1 application topically 2 (two) times daily.  Patient taking differently: Apply 1 application  topically 2 (two) times daily as needed (wound care).   [name], PA-C  Active Self           Med Note (SATTERFIELD, Vito Berger Oct 05, 2022  3:56 PM) Still has on hand just in case as needed   ondansetron (ZOFRAN-ODT) 8 MG disintegrating tablet [id number] Yes Take 1 tablet (8 mg total) by mouth every 8 (eight) hours as needed for nausea or vomiting. Heilingoetter, Cassandra L, PA-C Taking Active Self  pantoprazole (PROTONIX) 40 MG tablet [id number] No Take 1 tablet (40 mg total) by mouth 2 (two) times daily.  Patient not taking: Reported on 10/16/2022   Willeen Niece, MD Not Taking Active            Med Note Marilu Favre Oct 16, 2022 11:05 AM) 10/16/22: Reports during TOC call not currently taking: she has 76-months worth of omeprezole and is using that before starting the pantoprazole  polyethylene glycol (MIRALAX / GLYCOLAX) 17 g packet [id number]  Take 17 g by mouth daily as needed for moderate constipation. [provider]  Active Self  potassium chloride SA (KLOR-CON M) 20 MEQ tablet [id number] No Take 1 tablet (20 mEq total) by mouth daily.  Patient not taking: Reported on 10/16/2022   Heilingoetter, Johnette Abraham, PA-C Not Taking Active Self           Med Note Marilu Favre Oct 16, 2022 11:24 AM) 10/16/22: reports during [facility] she has been unable to take this medication post-hospital discharge on 10/13/22: reports "too big to swallow;" and ongoing nausea/ vomiting  prochlorperazine (COMPAZINE) 25 MG suppository [id number]  Place 1 suppository (25 mg total) rectally every 12 (twelve) hours. Willeen Niece, MD  Active    promethazine (PHENERGAN) 25 MG suppository [id number]  Place 1 suppository (25 mg total) rectally every 8 (eight) hours as needed for nausea or vomiting. Si Gaul, MD  Active Self  rosuvastatin (CRESTOR) 5 MG tablet [id number]  TAKE 1 TABLET EVERY DAY  Patient taking differently: Take 5 mg by mouth at bedtime.   [name], PA-C  Active Self  sucralfate (CARAFATE) 1 g tablet [id number]  Take 1 tablet (1 g total) by mouth 4 (four) times daily. Dissolve each tablet in 15 cc water before use.  Patient taking differently: Take 1 g by mouth 4 (four) times daily.   Dorothy Puffer, MD  Active Self           Med Note (SATTERFIELD, Vito Berger Oct 05, 2022  3:52 PM) Patient states she also has the liquid form to take whenever she cannot swallow good, she is as of today (10-05-22)taking the liquid from because she cannot swallow at this time  VITAMIN D PO [id number]  Take 2 tablets by mouth daily. [provider]  Active Self           Home Care and Equipment/Supplies: Were Home Health Services Ordered?: No Any new equipment or medical supplies ordered?: No  Functional Questionnaire: Do you need assistance with bathing/showering or dressing?: Yes (family assists/ supervises as indicated) Do you need assistance with meal preparation?: Yes (family assists/  supervises as indicated) Do you need assistance with eating?: No Do you have difficulty maintaining continence: No Do you need assistance with getting out of bed/getting out of a chair/moving?: Yes (family assists/ supervises as indicated) Do you have difficulty managing or taking your medications?: Yes (family assists/ supervises as indicated)  Follow up appointments reviewed: PCP Follow-up appointment confirmed?: No (sent message to [facility] to schedule post-hospital follow up office visit, as per practice request) MD Provider Line Number:[phone] Given: No (verified well-established with current PCP) Specialist  Hospital Follow-up appointment confirmed?: Yes Date of Specialist follow-up appointment?: 10/24/22 (verified this is recommended time frame for follow up per hospital discharging provider notes) Follow-Up Specialty Provider:: oncology provider Do you need transportation to your follow-up appointment?: No Do you understand care options if your condition(s) worsen?: Yes-patient verbalized understanding  SDOH Interventions Today    Flowsheet Row Most Recent Value  SDOH Interventions   Food Insecurity Interventions Intervention Not Indicated  Transportation Interventions Intervention Not Indicated  [family provides transportation]      TOC Interventions Today    Flowsheet Row Most Recent Value  TOC Interventions   TOC Interventions Discussed/Reviewed TOC Interventions Discussed      Interventions Today    Flowsheet Row Most Recent Value  Chronic Disease   Chronic disease during today's visit Other  [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]  General Interventions   General Interventions Discussed/Reviewed General Interventions Discussed, Doctor Visits, Durable Medical Equipment (DME), Referral to Nurse, Communication with  [scheduled with RN CM Care Coordinator for follow up telephone visit on 11/02/22: first available slot]  Doctor Visits Discussed/Reviewed Doctor Visits Discussed, PCP, Specialist  Durable Medical Equipment (DME) Val Riles currently requiring/ using assistive devices -walker]  PCP/Specialist Visits Compliance with follow-up visit  Communication with PCP/Specialists, RN  Education Interventions   Education Provided Provided Education  Provided Verbal Education On When to see the doctor, Medication, Other  [importance of not becoming dehydrated,  importance of ensuring that patient's K+ levels do not fall]  Nutrition Interventions   Nutrition Discussed/Reviewed Nutrition Discussed  [confirmed patient has telephone visit with oncology nutritional team today-  confirmed she plans to attend telephone appointment as scheduled]  Pharmacy Interventions   Pharmacy Dicussed/Reviewed Pharmacy Topics Discussed  Safety Interventions   Safety Discussed/Reviewed Safety Discussed, Fall Risk      Caryl Pina, RN, BSN, CCRN Alumnus RN CM Care Coordination/ Transition of Care- [facility] Care Management [phone]: direct office

## 2022-10-16 NOTE — ED Provider Notes (Signed)
[place] EMERGENCY DEPARTMENT AT [facility] Provider Note   CSN: [id number] Arrival date & time: 10/16/22  1359     History  Chief Complaint  Patient presents with   Emesis    Ilma Limbach is a 72 y.o. female.  72 year old female presents with persistent nausea and vomiting.  Was in the hospital several days ago for same.  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  States that when he stopped the hospital few days ago she was to have emesis.  Symptoms only occur when she tries to swallow.  States her emesis has been nonbilious or bloody.  No fever or chills.  Some abdominal discomfort with it.  Also notes some abdominal distention.  Unresponsive to her home medications       Home Medications Prior to Admission medications   Medication Sig Start Date End Date Taking? Authorizing Provider  alum & mag hydroxide-simeth (MAALOX MAX) 400-400-40 MG/5ML suspension Take 15 mLs by mouth every 6 (six) hours as needed for indigestion. 09/10/22   Lonell Grandchild, MD  Coenzyme Q10 (CO Q-10 PO) Take 1 tablet by mouth daily.    [provider]  cyclobenzaprine (FLEXERIL) 5 MG tablet Take 1 tablet (5 mg total) by mouth 3 (three) times daily as needed for muscle spasms. 04/13/22   [name], PA-C  Fluticasone-Umeclidin-Vilant (TRELEGY ELLIPTA) 100-62.5-25 MCG/ACT AEPB Inhale 1 puff into the lungs daily. Patient taking differently: Inhale 1 puff into the lungs daily as needed (For shortness of breath). 05/06/21   Janie Morning, NP  Homeopathic Products (LEG CRAMPS) SUBL Place 2 tablets under the tongue daily as needed (leg cramps).    [provider]  HYDROcodone-acetaminophen (HYCET) 7.5-325 mg/15 ml solution Take 5-10 mLs by mouth every 4 (four) hours as needed for moderate pain. 09/14/22   Ronny Bacon, PA-C  lidocaine (XYLOCAINE) 2 % solution Use as directed 15 mLs in the mouth or throat every 6 (six) hours as needed for mouth pain.  09/10/22   Lonell Grandchild, MD  montelukast (SINGULAIR) 10 MG tablet TAKE 1 TABLET AT BEDTIME Patient taking differently: Take 10 mg by mouth at bedtime. 02/02/22   [name], PA-C  Multiple Vitamin (MULTIVITAMIN ADULT PO) Take 2 tablets by mouth daily.    [provider]  mupirocin ointment (BACTROBAN) 2 % Apply 1 application topically 2 (two) times daily. Patient taking differently: Apply 1 application  topically 2 (two) times daily as needed (wound care). 12/10/20   [name], PA-C  ondansetron (ZOFRAN-ODT) 8 MG disintegrating tablet Take 1 tablet (8 mg total) by mouth every 8 (eight) hours as needed for nausea or vomiting. 09/18/22   Heilingoetter, Cassandra L, PA-C  pantoprazole (PROTONIX) 40 MG tablet Take 1 tablet (40 mg total) by mouth 2 (two) times daily. Patient not taking: Reported on 10/16/2022 10/13/22 11/12/22  Willeen Niece, MD  polyethylene glycol (MIRALAX / GLYCOLAX) 17 g packet Take 17 g by mouth daily as needed for moderate constipation.    [provider]  potassium chloride SA (KLOR-CON M) 20 MEQ tablet Take 1 tablet (20 mEq total) by mouth daily. Patient not taking: Reported on 10/16/2022 09/18/22   Heilingoetter, Cassandra L, PA-C  prochlorperazine (COMPAZINE) 25 MG suppository Place 1 suppository (25 mg total) rectally every 12 (twelve) hours. 10/13/22   Willeen Niece, MD  promethazine (PHENERGAN) 25 MG suppository Place 1 suppository (25 mg total) rectally every 8 (eight) hours as needed for nausea or vomiting. 09/25/22  Si Gaul, MD  rosuvastatin (CRESTOR) 5 MG tablet TAKE 1 TABLET EVERY DAY Patient taking differently: Take 5 mg by mouth at bedtime. 02/02/22   [name], PA-C  sucralfate (CARAFATE) 1 g tablet Take 1 tablet (1 g total) by mouth 4 (four) times daily. Dissolve each tablet in 15 cc water before use. Patient taking differently: Take 1 g by mouth 4 (four) times daily. 08/18/22   Dorothy Puffer, MD  VITAMIN D PO Take 2 tablets by mouth  daily.    [provider]      Allergies    Codeine and Surgical lubricant    Review of Systems   Review of Systems  All other systems reviewed and are negative.   Physical Exam Updated Vital Signs BP (!) 106/91 (BP Location: Left Arm)   Pulse (!) 105   Temp 97.8 F (36.6 C) (Oral)   Resp 16   Ht 1.702 m (5\' 7" )   Wt 59.9 kg   SpO2 95%   BMI 20.67 kg/m  Physical Exam Vitals and nursing note reviewed.  Constitutional:      General: She is not in acute distress.    Appearance: Normal appearance. She is well-developed. She is not toxic-appearing.  HENT:     Head: Normocephalic and atraumatic.  Eyes:     General: Lids are normal.     Conjunctiva/sclera: Conjunctivae normal.     Pupils: Pupils are equal, round, and reactive to light.  Neck:     Thyroid: No thyroid mass.     Trachea: No tracheal deviation.  Cardiovascular:     Rate and Rhythm: Normal rate and regular rhythm.     Heart sounds: Normal heart sounds. No murmur heard.    No gallop.  Pulmonary:     Effort: Pulmonary effort is normal. No respiratory distress.     Breath sounds: Normal breath sounds. No stridor. No decreased breath sounds, wheezing, rhonchi or rales.  Abdominal:     General: There is no distension.     Palpations: Abdomen is soft.     Tenderness: There is no abdominal tenderness. There is no rebound.  Musculoskeletal:        General: No tenderness. Normal range of motion.     Cervical back: Normal range of motion and neck supple.  Skin:    General: Skin is warm and dry.     Findings: No abrasion or rash.  Neurological:     Mental Status: She is alert and oriented to person, place, and time. Mental status is at baseline.     GCS: GCS eye subscore is 4. GCS verbal subscore is 5. GCS motor subscore is 6.     Cranial Nerves: Cranial nerves are intact. No cranial nerve deficit.     Sensory: No sensory deficit.     Motor: Motor function is intact.  Psychiatric:        Attention and  Perception: Attention normal.        Speech: Speech normal.        Behavior: Behavior normal.     ED Results / Procedures / Treatments   Labs (all labs ordered are listed, but only abnormal results are displayed) Labs Reviewed  CBC WITH DIFFERENTIAL/PLATELET  COMPREHENSIVE METABOLIC PANEL  LIPASE, BLOOD  URINALYSIS, ROUTINE W REFLEX MICROSCOPIC    EKG None  Radiology No results found.  Procedures Procedures    Medications Ordered in ED Medications  lactated ringers bolus 2,000 mL (has no administration in time range)  lactated ringers infusion (has no administration in time range)  prochlorperazine (COMPAZINE) injection 10 mg (has no administration in time range)  diphenhydrAMINE (BENADRYL) injection 12.5 mg (has no administration in time range)    ED Course/ Medical Decision Making/ A&P                                 Medical Decision Making Amount and/or Complexity of Data Reviewed Labs: ordered. Radiology: ordered.  Risk Prescription drug management.   Given IV fluids here and feels somewhat better.  Electrolytes without significant hypokalemia.  CBC showed no signs of leukocytosis.  Abdominal CT per interpretation without acute findings.  She is still unable to tolerate taking oral medications at this time.  Patient may eventually require a feeding tube.  Will consult hospitalist for admission        Final Clinical Impression(s) / ED Diagnoses Final diagnoses:  None    Rx / DC Orders ED Discharge Orders     None         Lorre Nick, MD 10/16/22 1929

## 2022-10-16 NOTE — ED Notes (Signed)
..ED TO INPATIENT HANDOFF REPORT  Name/Age/Gender [name] 72 y.o. female  Code Status    Code Status Orders  (From admission, onward)           Start     Ordered   10/16/22 2002  Full code  Continuous       Question:  By:  Answer:  Consent: discussion documented in EHR   10/16/22 2002           Code Status History     Date Active Date Inactive Code Status Order ID Comments User Context   10/05/2022 1424 10/13/2022 1720 Full Code [id number]  Maryln Gottron, MD Inpatient   03/29/2020 1749 04/03/2020 1906 Full Code [id number]  Bunnie Domino Inpatient       Home/SNF/Other Home  Chief Complaint Intractable vomiting with nausea [R11.2]  Level of Care/Admitting Diagnosis ED Disposition     ED Disposition  Admit   Condition  --   Comment  Hospital Area: [facility] [[id number]]  Level of Care: Med-Surg [16]  May place patient in observation at [facility] or [facility] if equivalent level of care is available:: Yes  Covid Evaluation: Asymptomatic - no recent exposure (last 10 days) testing not required  Diagnosis: Intractable vomiting with nausea [[id number]]  Admitting Physician: Maryln Gottron [[id number]]  Attending Physician: Kirby Crigler, MIR [place] [[id number]]          Medical History Past Medical History:  Diagnosis Date   Abnormal chest x-ray 01/19/2020   Acute laryngopharyngitis 03/18/2020   Aortic atherosclerosis (HCC) 01/28/2020   Arthritis    Cardiac murmur 03/01/2020   COPD (chronic obstructive pulmonary disease) (HCC)    Coronary artery calcification seen on CT scan 03/01/2020   Cough 01/12/2020   COVID-19 03/10/2020   Dyspnea    on exertion,after getting covid   Family history of adverse reaction to anesthesia    brothr had n/v   GERD (gastroesophageal reflux disease)    Hiatal hernia    Hyperlipemia    Lung cancer, middle lobe (HCC)    middle lobe removed   Pneumonia    PONV (postoperative nausea and vomiting)     Pre-operative cardiovascular examination 03/01/2020   Right lower lobe lung mass 01/28/2020   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   Visit for screening mammogram 01/28/2020    Allergies Allergies  Allergen Reactions   Codeine Hives   Surgical Lubricant Hives    IV Location/Drains/Wounds Patient Lines/Drains/Airways Status     Active Line/Drains/Airways     Name Placement date Placement time Site Days   Implanted Port 10/10/22 Right Chest 10/10/22  1014  Chest  6   Peripheral IV 10/16/22 18 G Right Antecubital 10/16/22  1450  Antecubital  less than 1            Labs/Imaging Results for orders placed or performed during the hospital encounter of 10/16/22 (from the past 48 hour(s))  CBC with Differential/Platelet     Status: Abnormal   Collection Time: 10/16/22  2:49 PM  Result Value Ref Range   WBC 6.5 4.0 - 10.5 K/uL   RBC 3.92 3.87 - 5.11 MIL/uL   Hemoglobin 11.1 (L) 12.0 - 15.0 g/dL   HCT 71.0 (L) 62.6 - 94.8 %   MCV 87.5 80.0 - 100.0 fL   MCH 28.3 26.0 - 34.0 pg   MCHC 32.4 30.0 - 36.0 g/dL   RDW 54.6 (H) 27.0 - 35.0 %   Platelets  177 150 - 400 K/uL   nRBC 0.0 0.0 - 0.2 %   Neutrophils Relative % 72 %   Neutro Abs 4.7 1.7 - 7.7 K/uL   Lymphocytes Relative 9 %   Lymphs Abs 0.6 (L) 0.7 - 4.0 K/uL   Monocytes Relative 17 %   Monocytes Absolute 1.1 (H) 0.1 - 1.0 K/uL   Eosinophils Relative 1 %   Eosinophils Absolute 0.1 0.0 - 0.5 K/uL   Basophils Relative 0 %   Basophils Absolute 0.0 0.0 - 0.1 K/uL   Immature Granulocytes 1 %   Abs Immature Granulocytes 0.05 0.00 - 0.07 K/uL    Comment: Performed at [facility], [address] [place], [place] [address]  Comprehensive metabolic panel     Status: Abnormal   Collection Time: 10/16/22  2:49 PM  Result Value Ref Range   Sodium 135 135 - 145 mmol/L   Potassium 3.6 3.5 - 5.1 mmol/L   Chloride 94 (L) 98 - 111 mmol/L   CO2 26 22 - 32 mmol/L   Glucose, Bld 112 (H) 70 - 99 mg/dL     Comment: Glucose reference range applies only to samples taken after fasting for at least 8 hours.   BUN 6 (L) 8 - 23 mg/dL   Creatinine, Ser 6.04 0.44 - 1.00 mg/dL   Calcium 8.1 (L) 8.9 - 10.3 mg/dL   Total Protein 5.4 (L) 6.5 - 8.1 g/dL   Albumin 2.4 (L) 3.5 - 5.0 g/dL   AST 18 15 - 41 U/L   ALT 16 0 - 44 U/L   Alkaline Phosphatase 56 38 - 126 U/L   Total Bilirubin 0.8 0.3 - 1.2 mg/dL   GFR, Estimated >54 >09 mL/min    Comment: (NOTE) Calculated using the CKD-EPI Creatinine Equation (2021)    Anion gap 15 5 - 15    Comment: Performed at [facility], [address], [place], [place] [address]  Lipase, blood     Status: None   Collection Time: 10/16/22  2:49 PM  Result Value Ref Range   Lipase 35 11 - 51 U/L    Comment: Performed at [facility], [address], [place], [place] [address]   CT ABDOMEN PELVIS WO CONTRAST  Result Date: 10/16/2022 CLINICAL DATA:  Abdominal pain, acute, nonlocalized. Nausea and vomiting. Diffuse abdominal pain. EXAM: CT ABDOMEN AND PELVIS WITHOUT CONTRAST TECHNIQUE: Multidetector CT imaging of the abdomen and pelvis was performed following the standard protocol without IV contrast. RADIATION DOSE REDUCTION: This exam was performed according to the departmental dose-optimization program which includes automated exposure control, adjustment of the mA and/or kV according to patient size and/or use of iterative reconstruction technique. COMPARISON:  CT abdomen/pelvis 09/10/2022. FINDINGS: Lower chest: No acute findings.  Coronary artery calcifications. Hepatobiliary: No focal liver abnormality is seen. No gallstones, gallbladder wall thickening, or biliary dilatation. Pancreas: Unremarkable. No pancreatic ductal dilatation or surrounding inflammatory changes. Spleen: Normal in size without focal abnormality. Adrenals/Urinary Tract: Adrenal glands are unremarkable. Kidneys are normal, without renal calculi, focal lesion, or  hydronephrosis. Bladder is unremarkable. Stomach/Bowel: Stomach is within normal limits. Appendix appears normal. No evidence of bowel wall thickening, distention, or inflammatory changes. Vascular/Lymphatic: Aortic atherosclerosis. No enlarged abdominal or pelvic lymph nodes. Reproductive: Uterus and bilateral adnexa are unremarkable. Vaginal pessary in place. Other: No abdominal wall hernia or abnormality. No abdominopelvic ascites. Musculoskeletal: No acute or significant osseous findings. IMPRESSION: 1. No acute abdominopelvic findings. 2. Coronary artery calcifications. Aortic  Atherosclerosis (ICD10-I70.0). Electronically Signed   By: Orvan Falconer M.D.   On: 10/16/2022 18:17    Pending Labs Unresulted Labs (From admission, onward)     Start     Ordered   10/17/22 0500  Basic metabolic panel  Tomorrow morning,   R        10/16/22 2002   10/17/22 0500  CBC  Tomorrow morning,   R        10/16/22 2002   10/16/22 1506  Urinalysis, Routine w reflex microscopic -Urine, Clean Catch  Once,   URGENT       Question:  Specimen Source  Answer:  Urine, Clean Catch   10/16/22 1506            Vitals/Pain Today's Vitals   10/16/22 1455 10/16/22 1456 10/16/22 1730 10/16/22 1945  BP: (!) 106/91  128/69 (!) 150/74  Pulse: (!) 105  93 95  Resp: 16  16   Temp: 97.8 F (36.6 C)     TempSrc: Oral     SpO2: 95%  95% 97%  Weight:  59.9 kg    Height:  5\' 7"  (1.702 m)      Isolation Precautions No active isolations  Medications Medications  lactated ringers infusion ( Intravenous New Bag/Given 10/16/22 1708)  HYDROcodone-acetaminophen (HYCET) 7.5-325 mg/15 ml solution 5-10 mL (has no administration in time range)  rosuvastatin (CRESTOR) tablet 5 mg (has no administration in time range)  alum & mag hydroxide-simeth (MAALOX PLUS) 400-400-40 MG/5ML suspension 15 mL (has no administration in time range)  sucralfate (CARAFATE) tablet 1 g (has no administration in time range)  cyclobenzaprine  (FLEXERIL) tablet 5 mg (has no administration in time range)  fluticasone furoate-vilanterol (BREO ELLIPTA) 100-25 MCG/ACT 1 puff (has no administration in time range)    And  umeclidinium bromide (INCRUSE ELLIPTA) 62.5 MCG/ACT 1 puff (has no administration in time range)  enoxaparin (LOVENOX) injection 40 mg (has no administration in time range)  0.9 %  sodium chloride infusion (has no administration in time range)  HYDROmorphone (DILAUDID) injection 0.5-1 mg (has no administration in time range)  traZODone (DESYREL) tablet 25 mg (has no administration in time range)  ondansetron (ZOFRAN) tablet 4 mg (has no administration in time range)    Or  ondansetron (ZOFRAN) injection 4 mg (has no administration in time range)  albuterol (PROVENTIL) (2.5 MG/3ML) 0.083% nebulizer solution 2.5 mg (has no administration in time range)  lactated ringers bolus 2,000 mL (2,000 mLs Intravenous New Bag/Given 10/16/22 1707)  prochlorperazine (COMPAZINE) injection 10 mg (10 mg Intravenous Given 10/16/22 1706)  diphenhydrAMINE (BENADRYL) injection 12.5 mg (12.5 mg Intravenous Given 10/16/22 1706)    Mobility walks with person assist

## 2022-10-17 ENCOUNTER — Encounter (HOSPITAL_COMMUNITY): Payer: Self-pay | Admitting: Internal Medicine

## 2022-10-17 DIAGNOSIS — Y842 Radiological procedure and radiotherapy as the cause of abnormal reaction of the patient, or of later complication, without mention of misadventure at the time of the procedure: Secondary | ICD-10-CM | POA: Diagnosis present

## 2022-10-17 DIAGNOSIS — Z923 Personal history of irradiation: Secondary | ICD-10-CM | POA: Diagnosis not present

## 2022-10-17 DIAGNOSIS — T451X5A Adverse effect of antineoplastic and immunosuppressive drugs, initial encounter: Secondary | ICD-10-CM | POA: Diagnosis present

## 2022-10-17 DIAGNOSIS — Z902 Acquired absence of lung [part of]: Secondary | ICD-10-CM | POA: Diagnosis not present

## 2022-10-17 DIAGNOSIS — Z833 Family history of diabetes mellitus: Secondary | ICD-10-CM | POA: Diagnosis not present

## 2022-10-17 DIAGNOSIS — Z9841 Cataract extraction status, right eye: Secondary | ICD-10-CM | POA: Diagnosis not present

## 2022-10-17 DIAGNOSIS — K2100 Gastro-esophageal reflux disease with esophagitis, without bleeding: Secondary | ICD-10-CM | POA: Diagnosis present

## 2022-10-17 DIAGNOSIS — C3491 Malignant neoplasm of unspecified part of right bronchus or lung: Secondary | ICD-10-CM | POA: Diagnosis present

## 2022-10-17 DIAGNOSIS — J449 Chronic obstructive pulmonary disease, unspecified: Secondary | ICD-10-CM | POA: Diagnosis present

## 2022-10-17 DIAGNOSIS — I700 Atherosclerosis of aorta: Secondary | ICD-10-CM | POA: Diagnosis not present

## 2022-10-17 DIAGNOSIS — F419 Anxiety disorder, unspecified: Secondary | ICD-10-CM | POA: Diagnosis present

## 2022-10-17 DIAGNOSIS — J439 Emphysema, unspecified: Secondary | ICD-10-CM | POA: Diagnosis not present

## 2022-10-17 DIAGNOSIS — I2510 Atherosclerotic heart disease of native coronary artery without angina pectoris: Secondary | ICD-10-CM | POA: Diagnosis present

## 2022-10-17 DIAGNOSIS — G893 Neoplasm related pain (acute) (chronic): Secondary | ICD-10-CM | POA: Diagnosis present

## 2022-10-17 DIAGNOSIS — R54 Age-related physical debility: Secondary | ICD-10-CM | POA: Diagnosis present

## 2022-10-17 DIAGNOSIS — Z885 Allergy status to narcotic agent status: Secondary | ICD-10-CM | POA: Diagnosis not present

## 2022-10-17 DIAGNOSIS — Z8616 Personal history of COVID-19: Secondary | ICD-10-CM | POA: Diagnosis not present

## 2022-10-17 DIAGNOSIS — E785 Hyperlipidemia, unspecified: Secondary | ICD-10-CM | POA: Diagnosis present

## 2022-10-17 DIAGNOSIS — T50995A Adverse effect of other drugs, medicaments and biological substances, initial encounter: Secondary | ICD-10-CM | POA: Diagnosis present

## 2022-10-17 DIAGNOSIS — Z9842 Cataract extraction status, left eye: Secondary | ICD-10-CM | POA: Diagnosis not present

## 2022-10-17 DIAGNOSIS — D630 Anemia in neoplastic disease: Secondary | ICD-10-CM | POA: Diagnosis present

## 2022-10-17 DIAGNOSIS — Z87891 Personal history of nicotine dependence: Secondary | ICD-10-CM | POA: Diagnosis not present

## 2022-10-17 DIAGNOSIS — R112 Nausea with vomiting, unspecified: Secondary | ICD-10-CM | POA: Diagnosis present

## 2022-10-17 DIAGNOSIS — C3490 Malignant neoplasm of unspecified part of unspecified bronchus or lung: Secondary | ICD-10-CM | POA: Diagnosis not present

## 2022-10-17 DIAGNOSIS — E860 Dehydration: Secondary | ICD-10-CM | POA: Diagnosis present

## 2022-10-17 DIAGNOSIS — E876 Hypokalemia: Secondary | ICD-10-CM | POA: Diagnosis present

## 2022-10-17 DIAGNOSIS — Z8249 Family history of ischemic heart disease and other diseases of the circulatory system: Secondary | ICD-10-CM | POA: Diagnosis not present

## 2022-10-17 DIAGNOSIS — Z888 Allergy status to other drugs, medicaments and biological substances status: Secondary | ICD-10-CM | POA: Diagnosis not present

## 2022-10-17 DIAGNOSIS — C779 Secondary and unspecified malignant neoplasm of lymph node, unspecified: Secondary | ICD-10-CM | POA: Diagnosis not present

## 2022-10-17 LAB — BASIC METABOLIC PANEL
Anion gap: 12 (ref 5–15)
BUN: <5 mg/dL — ABNORMAL LOW (ref 8–23)
CO2: 24 mmol/L (ref 22–32)
Calcium: 7.9 mg/dL — ABNORMAL LOW (ref 8.9–10.3)
Chloride: 94 mmol/L — ABNORMAL LOW (ref 98–111)
Creatinine, Ser: 0.74 mg/dL (ref 0.44–1.00)
GFR, Estimated: >60 mL/min (ref >60)
Glucose, Bld: 92 mg/dL (ref 70–99)
Potassium: 3.3 mmol/L — ABNORMAL LOW (ref 3.5–5.1)
Sodium: 130 mmol/L — ABNORMAL LOW (ref 135–145)

## 2022-10-17 LAB — CBC
HCT: 31.3 % — ABNORMAL LOW (ref 36.0–46.0)
Hemoglobin: 10 g/dL — ABNORMAL LOW (ref 12.0–15.0)
MCH: 27.8 pg (ref 26.0–34.0)
MCHC: 31.9 g/dL (ref 30.0–36.0)
MCV: 86.9 fL (ref 80.0–100.0)
Platelets: 134 10*3/uL — ABNORMAL LOW (ref 150–400)
RBC: 3.6 MIL/uL — ABNORMAL LOW (ref 3.87–5.11)
RDW: 22.9 % — ABNORMAL HIGH (ref 11.5–15.5)
WBC: 5.2 10*3/uL (ref 4.0–10.5)
nRBC: 0 % (ref 0.0–0.2)

## 2022-10-17 MED ORDER — POTASSIUM CHLORIDE 10 MEQ/100ML IV SOLN
10.0000 meq | INTRAVENOUS | Status: AC
  Administered 2022-10-17 (×4): 10 meq via INTRAVENOUS
  Filled 2022-10-17 (×4): qty 100

## 2022-10-17 MED ORDER — MAGNESIUM SULFATE 2 GM/50ML IV SOLN
2.0000 g | Freq: Once | INTRAVENOUS | Status: AC
  Administered 2022-10-17: 2 g via INTRAVENOUS
  Filled 2022-10-17: qty 50

## 2022-10-17 NOTE — Progress Notes (Signed)
Mobility Specialist - Progress Note   10/17/22 1535  Mobility  Activity Ambulated with assistance in hallway  Level of Assistance Standby assist, set-up cues, supervision of patient - no hands on  Assistive Device Front wheel walker  Distance Ambulated (ft) 430 ft  Activity Response Tolerated well  Mobility Referral Yes  $Mobility charge 1 Mobility  Mobility Specialist Start Time (ACUTE ONLY) 0319  Mobility Specialist Stop Time (ACUTE ONLY) 0334  Mobility Specialist Time Calculation (min) (ACUTE ONLY) 15 min   Pt received in bed and agreeable to mobility. No complaints during session. Pt to bed after session with all needs met.    [facility]

## 2022-10-17 NOTE — Progress Notes (Signed)
Pt refused her Breo/Incruse. Pt states she only takes PRN at home.

## 2022-10-17 NOTE — Progress Notes (Signed)
Mobility Specialist - Progress Note   10/17/22 1122  Mobility  Activity Ambulated with assistance in hallway  Level of Assistance Standby assist, set-up cues, supervision of patient - no hands on  Assistive Device Front wheel walker  Distance Ambulated (ft) 430 ft  Activity Response Tolerated well  Mobility Referral Yes  $Mobility charge 1 Mobility  Mobility Specialist Start Time (ACUTE ONLY) 1113  Mobility Specialist Stop Time (ACUTE ONLY) 1120  Mobility Specialist Time Calculation (min) (ACUTE ONLY) 7 min   Pt received in bed and agreeable to mobility. No complaints during session. Pt to bed after session with all needs met & nurse in room.  [facility]

## 2022-10-17 NOTE — Progress Notes (Signed)
PROGRESS NOTE    [name]  NIO:[id number] DOB: 15-Jan-1951 DOA: 10/16/2022 PCP: [name], PA-C  Outpatient Specialists:     Brief Narrative:  Patient is a 72 year old female past medical history significant for GERD, hiatal hernia, hyperlipidemia, non-small cell lung cancer status post final radiation 09/25/2022 and ongoing chemotherapy with carboplatin and paclitaxel under the care of Dr. Arbutus Ped.  Patient was admitted with intractable nausea and vomiting.  Symptoms are slowly improving.  Manage expectantly.  Seen alongside patient's daughter.  10/17/2022: Patient seen alongside patient's daughter.  Vitals are stable.  Renal panel done today revealed sodium of 130, potassium of 3.3, BUN of less than 5 and serum creatinine of 0.74.  CO2 is 24.  CBC done earlier today revealed WBC of 5.2, hemoglobin of 10, hematocrit of 31.3, MCV of 86.9 and platelet count of 134.  Patient is currently being managed supportively.  Assessment & Plan:   Principal Problem:   Intractable vomiting with nausea  Intractable nausea vomiting: -Slowly improving. -Continue antiemetics. -Supportive management. -Adequate hydration. -Monitor and correct abnormal electrolytes. -Further management will depend on hospital course.  Volume depletion: -Continue IV fluids.  Hypokalemia: -Potassium of 3.3. -IV KCl 10 mEq every hour x 4 doses. -Last magnesium level visualized was 1.7. -IV magnesium 2 g x 1 dose. -Renal panel in AM. -Magnesium level in AM.  Cancer pain: -Optimize pain control.  Muscle spasms: Flexeril as needed.  GERD: IV PPI.  Anxiety: -Benzodiazepines as needed.   DVT prophylaxis: Lovenox. Code Status: Full code. Family Communication: Translator was at bedside. Disposition Plan: Likely discharge in the next 1 to 2 days.   Consultants:  Patient is known to Dr. Shirline Frees.  Procedures:  None.  Antimicrobials:  None.   Subjective: Admitted with nausea and  vomiting.  Objective: Vitals:   10/16/22 2115 10/16/22 2132 10/17/22 0250 10/17/22 0959  BP:  131/73 138/72 (!) 122/57  Pulse: 94 96 (!) 107 (!) 102  Resp: 17 18 18 16   Temp:  98.1 F (36.7 C) 98.2 F (36.8 C) 98.3 F (36.8 C)  TempSrc:  Oral Oral Oral  SpO2: 96% 97% 95% 95%  Weight:      Height:        Intake/Output Summary (Last 24 hours) at 10/17/2022 1028 Last data filed at 10/17/2022 0122 Gross per 24 hour  Intake 621.65 ml  Output --  Net 621.65 ml   Filed Weights   10/16/22 1456  Weight: 59.9 kg    Examination:  General exam: Appears calm and comfortable  Respiratory system: Clear to auscultation.  Cardiovascular system: S1 & S2 heard,  Gastrointestinal system: Abdomen is soft and nontender. Central nervous system: Awake and alert.  Patient moves all extremities.    Data Reviewed: I have personally reviewed following labs and imaging studies  CBC: Recent Labs  Lab 10/12/22 0500 10/16/22 1449 10/17/22 0535  WBC 3.9* 6.5 5.2  NEUTROABS  --  4.7  --   HGB 10.4* 11.1* 10.0*  HCT 31.4* 34.3* 31.3*  MCV 84.9 87.5 86.9  PLT 126* 177 134*   Basic Metabolic Panel: Recent Labs  Lab 10/11/22 0430 10/12/22 0500 10/13/22 0516 10/16/22 1449 10/17/22 0535  NA 132* 132* 130* 135 130*  K 3.6 3.7 3.9 3.6 3.3*  CL 101 100 94* 94* 94*  CO2 22 25 27 26 24   GLUCOSE 103* 114* 103* 112* 92  BUN <5* <5* <5* 6* <5*  CREATININE 0.62 0.69 0.63 0.75 0.74  CALCIUM 8.2* 8.1* 8.0* 8.1* 7.9*  MG  --  1.2* 1.7  --   --   PHOS  --  3.5  --   --   --    GFR: Estimated Creatinine Clearance: 59.2 mL/min (by C-G formula based on SCr of 0.74 mg/dL). Liver Function Tests: Recent Labs  Lab 10/16/22 1449  AST 18  ALT 16  ALKPHOS 56  BILITOT 0.8  PROT 5.4*  ALBUMIN 2.4*   Recent Labs  Lab 10/16/22 1449  LIPASE 35   No results for input(s): "AMMONIA" in the last 168 hours. Coagulation Profile: No results for input(s): "INR", "PROTIME" in the last 168  hours. Cardiac Enzymes: No results for input(s): "CKTOTAL", "CKMB", "CKMBINDEX", "TROPONINI" in the last 168 hours. BNP (last 3 results) No results for input(s): "PROBNP" in the last 8760 hours. HbA1C: No results for input(s): "HGBA1C" in the last 72 hours. CBG: No results for input(s): "GLUCAP" in the last 168 hours. Lipid Profile: No results for input(s): "CHOL", "HDL", "LDLCALC", "TRIG", "CHOLHDL", "LDLDIRECT" in the last 72 hours. Thyroid Function Tests: No results for input(s): "TSH", "T4TOTAL", "FREET4", "T3FREE", "THYROIDAB" in the last 72 hours. Anemia Panel: No results for input(s): "VITAMINB12", "FOLATE", "FERRITIN", "TIBC", "IRON", "RETICCTPCT" in the last 72 hours. Urine analysis:    Component Value Date/Time   COLORURINE YELLOW 10/16/2022 2018   APPEARANCEUR HAZY (A) 10/16/2022 2018   LABSPEC 1.008 10/16/2022 2018   PHURINE 8.0 10/16/2022 2018   GLUCOSEU NEGATIVE 10/16/2022 2018   HGBUR SMALL (A) 10/16/2022 2018   BILIRUBINUR NEGATIVE 10/16/2022 2018   KETONESUR 5 (A) 10/16/2022 2018   PROTEINUR NEGATIVE 10/16/2022 2018   NITRITE NEGATIVE 10/16/2022 2018   LEUKOCYTESUR LARGE (A) 10/16/2022 2018   Sepsis Labs: @LABRCNTIP (procalcitonin:4,lacticidven:4)  )No results found for this or any previous visit (from the past 240 hour(s)).       Radiology Studies: CT ABDOMEN PELVIS WO CONTRAST  Result Date: 10/16/2022 CLINICAL DATA:  Abdominal pain, acute, nonlocalized. Nausea and vomiting. Diffuse abdominal pain. EXAM: CT ABDOMEN AND PELVIS WITHOUT CONTRAST TECHNIQUE: Multidetector CT imaging of the abdomen and pelvis was performed following the standard protocol without IV contrast. RADIATION DOSE REDUCTION: This exam was performed according to the departmental dose-optimization program which includes automated exposure control, adjustment of the mA and/or kV according to patient size and/or use of iterative reconstruction technique. COMPARISON:  CT abdomen/pelvis  09/10/2022. FINDINGS: Lower chest: No acute findings.  Coronary artery calcifications. Hepatobiliary: No focal liver abnormality is seen. No gallstones, gallbladder wall thickening, or biliary dilatation. Pancreas: Unremarkable. No pancreatic ductal dilatation or surrounding inflammatory changes. Spleen: Normal in size without focal abnormality. Adrenals/Urinary Tract: Adrenal glands are unremarkable. Kidneys are normal, without renal calculi, focal lesion, or hydronephrosis. Bladder is unremarkable. Stomach/Bowel: Stomach is within normal limits. Appendix appears normal. No evidence of bowel wall thickening, distention, or inflammatory changes. Vascular/Lymphatic: Aortic atherosclerosis. No enlarged abdominal or pelvic lymph nodes. Reproductive: Uterus and bilateral adnexa are unremarkable. Vaginal pessary in place. Other: No abdominal wall hernia or abnormality. No abdominopelvic ascites. Musculoskeletal: No acute or significant osseous findings. IMPRESSION: 1. No acute abdominopelvic findings. 2. Coronary artery calcifications. Aortic Atherosclerosis (ICD10-I70.0). Electronically Signed   By: Orvan Falconer M.D.   On: 10/16/2022 18:17        Scheduled Meds:  Chlorhexidine Gluconate Cloth  6 each Topical Daily   enoxaparin (LOVENOX) injection  40 mg Subcutaneous Q24H   fluticasone furoate-vilanterol  1 puff Inhalation Daily   And   umeclidinium bromide  1 puff Inhalation Daily   prochlorperazine  25 mg Rectal Q12H   rosuvastatin  5 mg Oral QHS   sodium chloride flush  10-40 mL Intracatheter Q12H   sucralfate  1 g Oral TID AC & HS   Continuous Infusions:  sodium chloride 50 mL/hr at 10/17/22 0739   lactated ringers Stopped (10/16/22 2119)   promethazine (PHENERGAN) injection (IM or IVPB)       LOS: 0 days    Time spent: 35 minutes.    Berton Mount, MD  [facility] Pager #: [phone] 7PM-7AM contact night coverage as above

## 2022-10-17 NOTE — Care Management Obs Status (Signed)
MEDICARE OBSERVATION STATUS NOTIFICATION   Patient Details  Name: [name] MRN: [id number] Date of Birth: 1950-12-15   Medicare Observation Status Notification Given:  Yes    Beckie Busing, RN 10/17/2022, 10:13 AM

## 2022-10-18 ENCOUNTER — Inpatient Hospital Stay (HOSPITAL_COMMUNITY): Payer: Medicare HMO

## 2022-10-18 ENCOUNTER — Telehealth: Payer: Self-pay | Admitting: Internal Medicine

## 2022-10-18 ENCOUNTER — Ambulatory Visit (HOSPITAL_COMMUNITY): Admission: RE | Admit: 2022-10-18 | Payer: Medicare HMO | Source: Ambulatory Visit

## 2022-10-18 ENCOUNTER — Inpatient Hospital Stay: Payer: Medicare HMO

## 2022-10-18 DIAGNOSIS — K2100 Gastro-esophageal reflux disease with esophagitis, without bleeding: Secondary | ICD-10-CM | POA: Diagnosis not present

## 2022-10-18 DIAGNOSIS — F419 Anxiety disorder, unspecified: Secondary | ICD-10-CM

## 2022-10-18 DIAGNOSIS — G893 Neoplasm related pain (acute) (chronic): Secondary | ICD-10-CM

## 2022-10-18 DIAGNOSIS — E876 Hypokalemia: Secondary | ICD-10-CM | POA: Diagnosis not present

## 2022-10-18 DIAGNOSIS — R112 Nausea with vomiting, unspecified: Secondary | ICD-10-CM | POA: Diagnosis not present

## 2022-10-18 DIAGNOSIS — E860 Dehydration: Secondary | ICD-10-CM | POA: Diagnosis not present

## 2022-10-18 LAB — RENAL FUNCTION PANEL
Albumin: 2.5 g/dL — ABNORMAL LOW (ref 3.5–5.0)
Anion gap: 9 (ref 5–15)
BUN: <5 mg/dL — ABNORMAL LOW (ref 8–23)
CO2: 24 mmol/L (ref 22–32)
Calcium: 7.7 mg/dL — ABNORMAL LOW (ref 8.9–10.3)
Chloride: 97 mmol/L — ABNORMAL LOW (ref 98–111)
Creatinine, Ser: 0.72 mg/dL (ref 0.44–1.00)
GFR, Estimated: >60 mL/min (ref >60)
Glucose, Bld: 94 mg/dL (ref 70–99)
Phosphorus: 3.1 mg/dL (ref 2.5–4.6)
Potassium: 3.4 mmol/L — ABNORMAL LOW (ref 3.5–5.1)
Sodium: 130 mmol/L — ABNORMAL LOW (ref 135–145)

## 2022-10-18 LAB — CBC WITH DIFFERENTIAL/PLATELET
Abs Immature Granulocytes: 0.04 10*3/uL (ref 0.00–0.07)
Basophils Absolute: 0 10*3/uL (ref 0.0–0.1)
Basophils Relative: 0 %
Eosinophils Absolute: 0.2 10*3/uL (ref 0.0–0.5)
Eosinophils Relative: 4 %
HCT: 30.9 % — ABNORMAL LOW (ref 36.0–46.0)
Hemoglobin: 10 g/dL — ABNORMAL LOW (ref 12.0–15.0)
Immature Granulocytes: 1 %
Lymphocytes Relative: 12 %
Lymphs Abs: 0.5 10*3/uL — ABNORMAL LOW (ref 0.7–4.0)
MCH: 28.4 pg (ref 26.0–34.0)
MCHC: 32.4 g/dL (ref 30.0–36.0)
MCV: 87.8 fL (ref 80.0–100.0)
Monocytes Absolute: 0.7 10*3/uL (ref 0.1–1.0)
Monocytes Relative: 17 %
Neutro Abs: 2.8 10*3/uL (ref 1.7–7.7)
Neutrophils Relative %: 66 %
Platelets: 124 10*3/uL — ABNORMAL LOW (ref 150–400)
RBC: 3.52 MIL/uL — ABNORMAL LOW (ref 3.87–5.11)
RDW: 22.7 % — ABNORMAL HIGH (ref 11.5–15.5)
WBC: 4.1 10*3/uL (ref 4.0–10.5)
nRBC: 0 % (ref 0.0–0.2)

## 2022-10-18 LAB — MAGNESIUM: Magnesium: 2.3 mg/dL (ref 1.7–2.4)

## 2022-10-18 MED ORDER — IOHEXOL 300 MG/ML  SOLN
75.0000 mL | Freq: Once | INTRAMUSCULAR | Status: AC | PRN
  Administered 2022-10-18: 75 mL via INTRAVENOUS

## 2022-10-18 MED ORDER — MONTELUKAST SODIUM 10 MG PO TABS
10.0000 mg | ORAL_TABLET | Freq: Every day | ORAL | Status: DC
  Filled 2022-10-18: qty 1

## 2022-10-18 MED ORDER — POTASSIUM CHLORIDE 10 MEQ/100ML IV SOLN
10.0000 meq | INTRAVENOUS | Status: AC
  Administered 2022-10-18 (×4): 10 meq via INTRAVENOUS
  Filled 2022-10-18: qty 100

## 2022-10-18 MED ORDER — POTASSIUM CHLORIDE CRYS ER 10 MEQ PO TBCR: 40.0000 meq | EXTENDED_RELEASE_TABLET | Freq: Once | ORAL | Status: DC

## 2022-10-18 MED ORDER — LIDOCAINE VISCOUS HCL 2 % MT SOLN: 15.0000 mL | Freq: Four times a day (QID) | OROMUCOSAL | Status: DC | PRN

## 2022-10-18 MED ORDER — PANTOPRAZOLE SODIUM 40 MG IV SOLR
40.0000 mg | Freq: Once | INTRAVENOUS | Status: AC
  Administered 2022-10-18: 40 mg via INTRAVENOUS
  Filled 2022-10-18: qty 10

## 2022-10-18 MED ORDER — PANTOPRAZOLE SODIUM 40 MG IV SOLR
40.0000 mg | Freq: Two times a day (BID) | INTRAVENOUS | Status: DC
  Administered 2022-10-18 – 2022-10-19 (×3): 40 mg via INTRAVENOUS
  Filled 2022-10-18 (×3): qty 10

## 2022-10-18 MED ORDER — POTASSIUM CHLORIDE CRYS ER 20 MEQ PO TBCR: 40.0000 meq | EXTENDED_RELEASE_TABLET | Freq: Once | ORAL | Status: DC

## 2022-10-18 MED ORDER — POTASSIUM CHLORIDE 20 MEQ PO PACK: 40.0000 meq | PACK | Freq: Once | ORAL | Status: DC

## 2022-10-18 MED ORDER — ALUM & MAG HYDROXIDE-SIMETH 200-200-20 MG/5ML PO SUSP: 30.0000 mL | Freq: Four times a day (QID) | ORAL | Status: DC | PRN

## 2022-10-18 NOTE — Progress Notes (Addendum)
PROGRESS NOTE    [name]  ZOX:[id number] DOB: [birth year] DOA: 10/16/2022 PCP: [name], PA-C    Chief Complaint  Patient presents with   Emesis    Brief Narrative:  Patient is a 72 year old female past medical history significant for GERD, hiatal hernia, hyperlipidemia, non-small cell lung cancer status post final radiation 09/25/2022 and ongoing chemotherapy with carboplatin and paclitaxel under the care of Dr. Arbutus Ped.  Patient was admitted with intractable nausea and vomiting.  Symptoms are slowly improving.  Manage expectantly.  Seen alongside patient's daughter.   10/17/2022: Patient seen alongside patient's daughter.  Vitals are stable.  Renal panel done today revealed sodium of 130, potassium of 3.3, BUN of less than 5 and serum creatinine of 0.74.  CO2 is 24.  CBC done earlier today revealed WBC of 5.2, hemoglobin of 10, hematocrit of 31.3, MCV of 86.9 and platelet count of 134.  Patient is currently being managed supportively.   Assessment & Plan:   Principal Problem:   Intractable vomiting with nausea Active Problems:   Nausea and vomiting   Gastroesophageal reflux disease with esophagitis without hemorrhage   Anxiety   Cancer related pain   Hypokalemia  #1 intractable nausea vomiting -Leg secondary to non-small cell lung cancer and chemoradiation treatments. -Patient noted to have undergone upper endoscopy 10/07/2022 which noted to have moderate proximal and mid esophagitis with no bleeding felt likely secondary to radiation treatment, multiple gastric polyps biopsied. -GI recommended PPI twice daily and Carafate 4 times daily (2 weeks). -Patient unable to tolerate Carafate at this time. -Place on IV PPI every 12 hours. -GI cocktail as needed. -Continue IV Phenergan as needed, supportive care. -Advance to a full liquid diet and if tolerates will advance to a soft diet for breakfast.  2.  Dehydration -IV fluids.  3.  Hypokalemia -Likely secondary to GI  losses. -Potassium at 3.4 today, phosphorus at 3.1, magnesium at 2.3. -KCl 10 mEq IV hourly x 4 runs. -Repeat labs in the AM.  4.  GERD/esophagitis -IV PPI every 12 hours. -Carafate however patient refusing. -GI cocktail as needed.  5.  Anxiety -Ativan as needed.  6.  Cancer related pain/non-small cell lung cancer -Continue current pain regimen. -Patient being followed by Dr. Arlis Porta in the outpatient setting who has been informed of patient's admission. -Oncology recommending and has ordered staging CT scans. -Outpatient follow-up with oncology.   DVT prophylaxis: Lovenox Code Status: Full Family Communication: Updated patient, neighbors and friends at bedside. Disposition: TBD  Status is: Inpatient Remains inpatient appropriate because: Severity of illness.    Consultants:  None  Procedures:  CT abdomen and pelvis 10/16/2022   Antimicrobials: Anti-infectives (From admission, onward)    None         Subjective: Patient sitting up in bed.  Friends and neighbors at bedside.  States she has got some medication for nausea which has helped.  States had bad reflux overnight.  Tolerating current diet.  Denies any chest pain or shortness of breath.  Objective: Vitals:   10/17/22 1352 10/17/22 2047 10/18/22 0347 10/18/22 1358  BP: 133/81 (!) 127/52 (!) 141/82 121/70  Pulse: 94 (!) 101 (!) 101 (!) 108  Resp: 16 18 20 18   Temp: 98.1 F (36.7 C) 98.2 F (36.8 C) 98.1 F (36.7 C)   TempSrc: Oral Oral Oral   SpO2: 98% 99% 96% 99%  Weight:      Height:       No intake or output data in the 24  hours ending 10/18/22 1746 Filed Weights   10/16/22 1456  Weight: 59.9 kg    Examination:  General exam: Appears calm and comfortable.  Frail. Respiratory system: Clear to auscultation.  No wheezes, no crackles, no rhonchi.  Fair air movement.  Respiratory effort normal. Cardiovascular system: S1 & S2 heard, RRR. No JVD, murmurs, rubs, gallops or clicks. No  pedal edema. Gastrointestinal system: Abdomen is nondistended, soft and nontender. No organomegaly or masses felt. Normal bowel sounds heard. Central nervous system: Alert and oriented. No focal neurological deficits. Extremities: Symmetric 5 x 5 power. Skin: No rashes, lesions or ulcers Psychiatry: Judgement and insight appear normal. Mood & affect appropriate.     Data Reviewed: I have personally reviewed following labs and imaging studies  CBC: Recent Labs  Lab 10/12/22 0500 10/16/22 1449 10/17/22 0535 10/18/22 0440  WBC 3.9* 6.5 5.2 4.1  NEUTROABS  --  4.7  --  2.8  HGB 10.4* 11.1* 10.0* 10.0*  HCT 31.4* 34.3* 31.3* 30.9*  MCV 84.9 87.5 86.9 87.8  PLT 126* 177 134* 124*    Basic Metabolic Panel: Recent Labs  Lab 10/12/22 0500 10/13/22 0516 10/16/22 1449 10/17/22 0535 10/18/22 0440  NA 132* 130* 135 130* 130*  K 3.7 3.9 3.6 3.3* 3.4*  CL 100 94* 94* 94* 97*  CO2 25 27 26 24 24   GLUCOSE 114* 103* 112* 92 94  BUN <5* <5* 6* <5* <5*  CREATININE 0.69 0.63 0.75 0.74 0.72  CALCIUM 8.1* 8.0* 8.1* 7.9* 7.7*  MG 1.2* 1.7  --   --  2.3  PHOS 3.5  --   --   --  3.1    GFR: Estimated Creatinine Clearance: 59.2 mL/min (by C-G formula based on SCr of 0.72 mg/dL).  Liver Function Tests: Recent Labs  Lab 10/16/22 1449 10/18/22 0440  AST 18  --   ALT 16  --   ALKPHOS 56  --   BILITOT 0.8  --   PROT 5.4*  --   ALBUMIN 2.4* 2.5*    CBG: No results for input(s): "GLUCAP" in the last 168 hours.   No results found for this or any previous visit (from the past 240 hour(s)).       Radiology Studies: CT CHEST W CONTRAST  Result Date: 10/18/2022 CLINICAL DATA:  Non-small cell lung cancer EXAM: CT CHEST WITH CONTRAST TECHNIQUE: Multidetector CT imaging of the chest was performed during intravenous contrast administration. RADIATION DOSE REDUCTION: This exam was performed according to the departmental dose-optimization program which includes automated exposure  control, adjustment of the mA and/or kV according to patient size and/or use of iterative reconstruction technique. CONTRAST:  75mL OMNIPAQUE IOHEXOL 300 MG/ML  SOLN COMPARISON:  PET-CT dated 07/03/2022 FINDINGS: Cardiovascular: The heart is normal in size. No pericardial effusion. No evidence of thoracic aortic aneurysm. Atherosclerotic calcifications of the aortic arch. Mild three-vessel coronary atherosclerosis. Right chest port terminates at the cavoatrial junction. Mediastinum/Nodes: Mild mediastinal lymphadenopathy, including a dominant 12 mm short axis subcarinal node and 10 mm short axis right hilar node, grossly unchanged. These were hypermetabolic on prior PET, favoring nodal metastases. 1.7 x 3.2 cm low-density lesion along the anterior mediastinum (series 2/image 97), non FDG avid on prior PET and unchanged, benign. Visualized thyroid is unremarkable. Lungs/Pleura: Status post right middle lobectomy. Moderate centrilobular and paraseptal emphysematous changes, upper lung predominant. No suspicious pulmonary nodules. No focal consolidation. No pleural effusion or pneumothorax. Upper Abdomen: Visualized upper abdomen is grossly unremarkable, noting vascular calcifications. Musculoskeletal: Degenerative  changes of the visualized thoracolumbar spine. IMPRESSION: Status post right middle lobectomy. Small mediastinal and right hilar nodal metastases, grossly unchanged. No evidence of new/progressive metastatic disease. Aortic Atherosclerosis (ICD10-I70.0) and Emphysema (ICD10-J43.9). Electronically Signed   By: Charline Bills M.D.   On: 10/18/2022 16:34        Scheduled Meds:  Chlorhexidine Gluconate Cloth  6 each Topical Daily   enoxaparin (LOVENOX) injection  40 mg Subcutaneous Q24H   fluticasone furoate-vilanterol  1 puff Inhalation Daily   And   umeclidinium bromide  1 puff Inhalation Daily   montelukast  10 mg Oral QHS   pantoprazole (PROTONIX) IV  40 mg Intravenous Q12H   rosuvastatin  5  mg Oral QHS   sodium chloride flush  10-40 mL Intracatheter Q12H   sucralfate  1 g Oral TID AC & HS   Continuous Infusions:  lactated ringers Stopped (10/16/22 2119)   promethazine (PHENERGAN) injection (IM or IVPB) 12.5 mg (10/18/22 1548)     LOS: 1 day    Time spent: 35 minutes    Ramiro Harvest, MD [facility]   To contact the attending provider between 7A-7P or the covering provider during after hours 7P-7A, please log into the web site [email or web address] and access using universal  password for that web site. If you do not have the password, please call the hospital operator.  10/18/2022, 5:46 PM

## 2022-10-18 NOTE — Progress Notes (Signed)
Mobility Specialist - Progress Note   10/18/22 1057  Mobility  Activity Ambulated with assistance in hallway  Level of Assistance Standby assist, set-up cues, supervision of patient - no hands on  Assistive Device Front wheel walker  Distance Ambulated (ft) 280 ft  Activity Response Tolerated well  Mobility Referral Yes  $Mobility charge 1 Mobility  Mobility Specialist Start Time (ACUTE ONLY) 1039  Mobility Specialist Stop Time (ACUTE ONLY) 1057  Mobility Specialist Time Calculation (min) (ACUTE ONLY) 18 min   Pt received in bed and agreeable to mobility. No complaints during session. Pt to bed after session with all needs met.    [facility]

## 2022-10-19 ENCOUNTER — Other Ambulatory Visit (HOSPITAL_COMMUNITY): Payer: Self-pay

## 2022-10-19 DIAGNOSIS — R112 Nausea with vomiting, unspecified: Secondary | ICD-10-CM | POA: Diagnosis not present

## 2022-10-19 DIAGNOSIS — F419 Anxiety disorder, unspecified: Secondary | ICD-10-CM | POA: Diagnosis not present

## 2022-10-19 DIAGNOSIS — K2100 Gastro-esophageal reflux disease with esophagitis, without bleeding: Secondary | ICD-10-CM | POA: Diagnosis not present

## 2022-10-19 DIAGNOSIS — G893 Neoplasm related pain (acute) (chronic): Secondary | ICD-10-CM | POA: Diagnosis not present

## 2022-10-19 DIAGNOSIS — C3491 Malignant neoplasm of unspecified part of right bronchus or lung: Secondary | ICD-10-CM

## 2022-10-19 LAB — RENAL FUNCTION PANEL
Albumin: 2.5 g/dL — ABNORMAL LOW (ref 3.5–5.0)
Anion gap: 10 (ref 5–15)
BUN: <5 mg/dL — ABNORMAL LOW (ref 8–23)
CO2: 25 mmol/L (ref 22–32)
Calcium: 8.1 mg/dL — ABNORMAL LOW (ref 8.9–10.3)
Chloride: 96 mmol/L — ABNORMAL LOW (ref 98–111)
Creatinine, Ser: 0.82 mg/dL (ref 0.44–1.00)
GFR, Estimated: >60 mL/min (ref >60)
Glucose, Bld: 86 mg/dL (ref 70–99)
Phosphorus: 3.9 mg/dL (ref 2.5–4.6)
Potassium: 3.8 mmol/L (ref 3.5–5.1)
Sodium: 131 mmol/L — ABNORMAL LOW (ref 135–145)

## 2022-10-19 LAB — CBC
HCT: 33.3 % — ABNORMAL LOW (ref 36.0–46.0)
Hemoglobin: 10.8 g/dL — ABNORMAL LOW (ref 12.0–15.0)
MCH: 28.3 pg (ref 26.0–34.0)
MCHC: 32.4 g/dL (ref 30.0–36.0)
MCV: 87.4 fL (ref 80.0–100.0)
Platelets: 169 10*3/uL (ref 150–400)
RBC: 3.81 MIL/uL — ABNORMAL LOW (ref 3.87–5.11)
RDW: 23.1 % — ABNORMAL HIGH (ref 11.5–15.5)
WBC: 4.8 10*3/uL (ref 4.0–10.5)
nRBC: 0 % (ref 0.0–0.2)

## 2022-10-19 LAB — MAGNESIUM: Magnesium: 2.1 mg/dL (ref 1.7–2.4)

## 2022-10-19 MED ORDER — MAALOX MAX 400-400-40 MG/5ML PO SUSP
15.0000 mL | Freq: Four times a day (QID) | ORAL | 1 refills | Status: DC | PRN
Start: 2022-10-19 — End: 2023-04-19
  Filled 2022-10-19: qty 355, 6d supply, fill #0

## 2022-10-19 MED ORDER — PROCHLORPERAZINE 25 MG RE SUPP: 25.0000 mg | Freq: Two times a day (BID) | RECTAL | Status: DC | PRN

## 2022-10-19 MED ORDER — HEPARIN SOD (PORK) LOCK FLUSH 100 UNIT/ML IV SOLN
500.0000 [IU] | Freq: Once | INTRAVENOUS | Status: AC
  Administered 2022-10-19: 500 [IU] via INTRAVENOUS
  Filled 2022-10-19: qty 5

## 2022-10-19 MED ORDER — PROCHLORPERAZINE 25 MG RE SUPP
25.0000 mg | Freq: Two times a day (BID) | RECTAL | 1 refills | Status: AC
  Filled 2022-10-19: qty 30, 15d supply, fill #0

## 2022-10-19 MED ORDER — PROCHLORPERAZINE 25 MG RE SUPP
25.0000 mg | Freq: Two times a day (BID) | RECTAL | Status: DC
  Administered 2022-10-19: 25 mg via RECTAL
  Filled 2022-10-19 (×2): qty 1

## 2022-10-19 MED ORDER — ONDANSETRON 8 MG PO TBDP
8.0000 mg | ORAL_TABLET | Freq: Three times a day (TID) | ORAL | 2 refills | Status: DC | PRN
Start: 2022-10-19 — End: 2023-03-04
  Filled 2022-10-19: qty 30, 10d supply, fill #0

## 2022-10-19 MED ORDER — CYCLOBENZAPRINE HCL 5 MG PO TABS
5.0000 mg | ORAL_TABLET | Freq: Three times a day (TID) | ORAL | 0 refills | Status: DC | PRN
  Filled 2022-10-19: qty 20, 7d supply, fill #0

## 2022-10-19 MED ORDER — PROCHLORPERAZINE EDISYLATE 10 MG/2ML IJ SOLN: 5.0000 mg | INTRAMUSCULAR | Status: DC | PRN

## 2022-10-19 NOTE — TOC Initial Note (Signed)
Transition of Care [facility]) - Initial/Assessment Note    Patient Details  Name: [name] MRN: [id number] Date of Birth: 08/01/1950  Transition [facility]) CM/SW Contact:    Beckie Busing, RN Phone Number:cellulitis  10/19/2022, 3:17 PM  Clinical Narrative:                 TOC following patient with high risk for readmission. CM at bedside for assessment. Patient states that she is from home where her daughter and her family lives with her. Per patient she is independent and has a walker that she uses occasionally. Patient reports that she does have a PCP Earlene Plater, [place], [place] ) and she does follow on a regular basis. Pharmacy of choice is [facility] [place]. Patient does confirm that she has access to medications and they are affordable. Patient has transportation to appointment via family. Patient has no HH services at this time. There are currently no TOC needs. TOC will continue to follow  Expected Discharge Plan: Home/Self Care Barriers to Discharge: Continued Medical Work up   Patient Goals and CMS Choice Patient states their goals for this hospitalization and ongoing recovery are:: Wants to get better to go home CMS Medicare.gov Compare Post Acute Care list provided to::  (n/a) Choice offered to / list presented to : NA [place] ownership interest in [facility].provided to::  (n/a)    Expected Discharge Plan and Services In-house Referral: NA Discharge Planning Services: CM Consult Post Acute Care Choice: NA Living arrangements for the past 2 months: Single Family Home Expected Discharge Date: 10/19/22               DME Arranged: N/A DME Agency: NA       HH Arranged: NA HH Agency: NA        Prior Living Arrangements/Services Living arrangements for the past 2 months: Single Family Home Lives with:: Adult Children Patient language and need for interpreter reviewed:: Yes Do you feel safe going back to the place where you live?: Yes      Need for Family  Participation in Patient Care: Yes (Comment) Care giver support system in place?: Yes (comment) Current home services:  (n/a) Criminal Activity/Legal Involvement Pertinent to Current Situation/Hospitalization: No - Comment as needed  Activities of Daily Living Home Assistive Devices/Equipment: Dentures (specify type), Eyeglasses, Shower chair with back, Walker (specify type) (partial upper) ADL Screening (condition at time of admission) Patient's cognitive ability adequate to safely complete daily activities?: Yes Is the patient deaf or have difficulty hearing?: No Does the patient have difficulty seeing, even when wearing glasses/contacts?: No Does the patient have difficulty concentrating, remembering, or making decisions?: No Patient able to express need for assistance with ADLs?: Yes Does the patient have difficulty dressing or bathing?: Yes Independently performs ADLs?: No Communication: Independent Dressing (OT): Needs assistance Is this a change from baseline?: Change from baseline, expected to last >3 days Grooming: Needs assistance Is this a change from baseline?: Change from baseline, expected to last >3 days Feeding: Independent Bathing: Needs assistance Is this a change from baseline?: Change from baseline, expected to last >3 days Toileting: Needs assistance Is this a change from baseline?: Change from baseline, expected to last >3days In/Out Bed: Needs assistance Is this a change from baseline?: Change from baseline, expected to last >3 days Walks in Home: Needs assistance Is this a change from baseline?: Change from baseline, expected to last >3 days Does the patient have difficulty walking or climbing  stairs?: Yes Weakness of Legs: Both Weakness of Arms/Hands: Both  Permission Sought/Granted Permission sought to share information with : Family Supports Permission granted to share information with : No              Emotional Assessment Appearance:: Appears  stated age Attitude/Demeanor/Rapport: Gracious Affect (typically observed): Pleasant, Quiet Orientation: : Oriented to Self, Oriented to Place, Oriented to  Time, Oriented to Situation Alcohol / Substance Use: Not Applicable Psych Involvement: No (comment)  Admission diagnosis:  Intractable vomiting with nausea [R11.2] Nausea and vomiting, unspecified vomiting type [R11.2] Nausea and vomiting [R11.2] Patient Active Problem List   Diagnosis Date Noted   Gastroesophageal reflux disease with esophagitis without hemorrhage 10/18/2022   Anxiety 10/18/2022   Cancer related pain 10/18/2022   Hypokalemia 10/18/2022   Nausea and vomiting 10/17/2022   Intractable vomiting with nausea 10/16/2022   Intractable nausea and vomiting 10/06/2022   Hypokalemia due to excessive gastrointestinal loss of potassium 10/05/2022   Dehydration 09/11/2022   Encounter for antineoplastic chemotherapy 08/28/2022   Primary lung cancer, right (HCC) 08/22/2022   Acute right-sided back pain with sciatica 04/12/2022   Upper respiratory tract infection due to COVID-19 virus 02/16/2022   Acute infection of nasal sinus 02/16/2022   Vitamin D deficiency 12/08/2021   Needs flu shot 12/08/2021   History of lung cancer in adulthood 12/08/2021   Chronic obstructive pulmonary disease (HCC) 12/08/2021   Annual physical exam 06/09/2020   Encounter for screening for COVID-19 06/09/2020   Other fatigue 06/09/2020   Adenocarcinoma of right lung, stage 3 (HCC) 03/31/2020   S/P robot-assisted surgical procedure 03/29/2020   Acute laryngopharyngitis 03/18/2020   Pre-operative cardiovascular examination 03/01/2020   Coronary artery calcification seen on CT scan 03/01/2020   Cardiac murmur 03/01/2020   Hiatal hernia    Hyperlipemia    Aortic atherosclerosis (HCC) 01/28/2020   Visit for screening mammogram 01/28/2020   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 [place] [address] Phone: [phone] Fax: [phone]     Social Determinants of Health (SDOH) Social History: SDOH Screenings   Food Insecurity: No Food Insecurity (10/16/2022)  Housing: Low Risk  (10/16/2022)  Transportation Needs: No Transportation Needs (10/16/2022)  Utilities: Not At Risk (10/16/2022)  Alcohol Screen: Low Risk  (08/22/2022)  Depression (PHQ2-9): Medium Risk (08/22/2022)  Financial Resource Strain: Medium Risk (08/22/2022)  Physical Activity: Insufficiently Active (08/22/2022)  Social Connections: Moderately Isolated (08/22/2022)  Stress: No Stress Concern Present (08/22/2022)  Tobacco Use: Medium Risk (10/17/2022)   SDOH Interventions:     Readmission Risk Interventions    10/19/2022    2:44 PM 10/09/2022    9:30 PM  Readmission Risk Prevention Plan  Transportation Screening Complete Complete  PCP or Specialist Appt within 5-7 Days  Complete  PCP or Specialist Appt within 3-5 Days Complete   Home Care Screening  Complete  Medication Review (RN CM)  Complete  HRI or Home Care Consult Complete   Social Work Consult for Recovery Care Planning/Counseling Complete   Palliative Care Screening Not Applicable   Medication Review Oceanographer) Complete

## 2022-10-19 NOTE — Progress Notes (Signed)
Mobility Specialist - Progress Note   10/19/22 1018  Mobility  Activity Ambulated with assistance in hallway  Level of Assistance Modified independent, requires aide device or extra time  Assistive Device Front wheel walker  Distance Ambulated (ft) 460 ft  Activity Response Tolerated well  Mobility Referral Yes  $Mobility charge 1 Mobility  Mobility Specialist Start Time (ACUTE ONLY) 1007  Mobility Specialist Stop Time (ACUTE ONLY) 1016  Mobility Specialist Time Calculation (min) (ACUTE ONLY) 9 min   Pt received in bed and agreeable to mobility. No complaints during mobility. Pt required minA for bed mobility. Pt to bed after session with all needs met.    [facility]

## 2022-10-19 NOTE — Plan of Care (Addendum)
Patient alert and oriented X4, all medications reviewed, no new meds prescribed. Discharge questions answered and chest port deaccessed, patient is getting dressed and ride is on the way.  Problem: Education: Goal: Knowledge of General Education information will improve Description: Including pain rating scale, medication(s)/side effects and non-pharmacologic comfort measures 10/19/2022 1508 by Vic Blackbird, Ronita Hipps, RN Outcome: Adequate for Discharge 10/19/2022 1508 by Vic Blackbird, Ronita Hipps, RN Outcome: Progressing   Problem: Health Behavior/Discharge Planning: Goal: Ability to manage health-related needs will improve 10/19/2022 1508 by Vic Blackbird, Ronita Hipps, RN Outcome: Adequate for Discharge 10/19/2022 1508 by Vic Blackbird, Ronita Hipps, RN Outcome: Progressing   Problem: Clinical Measurements: Goal: Ability to maintain clinical measurements within normal limits will improve 10/19/2022 1508 by Vic Blackbird, Ronita Hipps, RN Outcome: Adequate for Discharge 10/19/2022 1508 by Vic Blackbird, Ronita Hipps, RN Outcome: Progressing Goal: Will remain free from infection 10/19/2022 1508 by Vic Blackbird, Ronita Hipps, RN Outcome: Adequate for Discharge 10/19/2022 1508 by Vic Blackbird, Ronita Hipps, RN Outcome: Progressing Goal: Diagnostic test results will improve 10/19/2022 1508 by Vic Blackbird, Ronita Hipps, RN Outcome: Adequate for Discharge 10/19/2022 1508 by Vic Blackbird, Ronita Hipps, RN Outcome: Progressing Goal: Respiratory complications will improve 10/19/2022 1508 by Vic Blackbird, Ronita Hipps, RN Outcome: Adequate for Discharge 10/19/2022 1508 by Vic Blackbird, Ronita Hipps, RN Outcome: Progressing Goal: Cardiovascular complication will be avoided 10/19/2022 1508 by Vic Blackbird, Ronita Hipps, RN Outcome: Adequate for Discharge 10/19/2022 1508 by Vic Blackbird,  Ronita Hipps, RN Outcome: Progressing   Problem: Activity: Goal: Risk for activity intolerance will decrease 10/19/2022 1508 by Vic Blackbird, Ronita Hipps, RN Outcome: Adequate for Discharge 10/19/2022 1508 by Vic Blackbird, Ronita Hipps, RN Outcome: Progressing   Problem: Nutrition: Goal: Adequate nutrition will be maintained 10/19/2022 1508 by Vic Blackbird, Ronita Hipps, RN Outcome: Adequate for Discharge 10/19/2022 1508 by Vic Blackbird, Ronita Hipps, RN Outcome: Progressing   Problem: Coping: Goal: Level of anxiety will decrease 10/19/2022 1508 by Vic Blackbird, Ronita Hipps, RN Outcome: Adequate for Discharge 10/19/2022 1508 by Vic Blackbird, Ronita Hipps, RN Outcome: Progressing   Problem: Elimination: Goal: Will not experience complications related to bowel motility 10/19/2022 1508 by Vic Blackbird, Ronita Hipps, RN Outcome: Adequate for Discharge 10/19/2022 1508 by Vic Blackbird, Ronita Hipps, RN Outcome: Progressing Goal: Will not experience complications related to urinary retention 10/19/2022 1508 by Vic Blackbird, Ronita Hipps, RN Outcome: Adequate for Discharge 10/19/2022 1508 by Vic Blackbird, Ronita Hipps, RN Outcome: Progressing   Problem: Pain Managment: Goal: General experience of comfort will improve 10/19/2022 1508 by Vic Blackbird, Ronita Hipps, RN Outcome: Adequate for Discharge 10/19/2022 1508 by Vic Blackbird, Ronita Hipps, RN Outcome: Progressing   Problem: Safety: Goal: Ability to remain free from injury will improve 10/19/2022 1508 by Vic Blackbird, Ronita Hipps, RN Outcome: Adequate for Discharge 10/19/2022 1508 by Vic Blackbird, Ronita Hipps, RN Outcome: Progressing   Problem: Skin Integrity: Goal: Risk for impaired skin integrity will decrease 10/19/2022 1508 by Floydene Flock, RN Outcome: Adequate for Discharge 10/19/2022 1508  by Vic Blackbird, Ronita Hipps, RN Outcome: Progressing

## 2022-10-19 NOTE — Progress Notes (Signed)
Mobility Specialist - Progress Note   10/19/22 1426  Mobility  Activity Ambulated with assistance in hallway  Level of Assistance Standby assist, set-up cues, supervision of patient - no hands on  Assistive Device Front wheel walker  Distance Ambulated (ft) 460 ft  Activity Response Tolerated well  Mobility Referral Yes  $Mobility charge 1 Mobility  Mobility Specialist Stop Time (ACUTE ONLY) 0226   Pt received in bed and agreeable to mobility. No complaints during session. Pt to bed after session with all needs met.    [facility]

## 2022-10-19 NOTE — Discharge Summary (Signed)
Physician Discharge Summary  [name] RSW:[id number] DOB: 01/23/1951 DOA: 10/16/2022  PCP: Renee Sofia, PA-C  Admit date: 10/16/2022 Discharge date: 10/19/2022  Time spent: 60 minutes  Recommendations for Outpatient Follow-up:  Follow-up with oncology, Dr. Arlis Porta in 1 to 2 weeks or as previously scheduled. Follow-up with Renee Sofia, PA-C in 2 weeks.  On follow-up patient will need a basic metabolic profile, magnesium level done to follow-up on electrolytes and renal function.   Discharge Diagnoses:  Principal Problem:   Intractable vomiting with nausea Active Problems:   Nausea and vomiting   Gastroesophageal reflux disease with esophagitis without hemorrhage   Anxiety   Cancer related pain   Hypokalemia   Discharge Condition: Stable and improved.  Diet recommendation: Soft diet for the next 2 to 3 days and then advance as tolerated to a regular diet.  Filed Weights   10/16/22 1456  Weight: 59.9 kg    History of present illness:  HPI per Dr. Linus [place] is a 72 y.o. female with medical history significant for GERD, hiatal hernia, hyperlipidemia, non-small cell lung cancer status post final radiation 09/25/2022 and ongoing chemotherapy with carboplatin and paclitaxel under the care of Dr. Arbutus Ped who is being readmitted to the hospital with intractable nausea and vomiting.  She was recently admitted to the hospital 8/15 to 8/23 due to similar symptoms.  During that hospital stay, she had upper endoscopy with gastroenterology Dr. Chales Abrahams which showed proximal and mild esophagitis with no bleeding.  Biopsies were taken.  During that hospital stay, she also had Port-A-Cath placed by IR.  Per the patient, her daughter, and granddaughter who are her caregivers, when she was discharged from the hospital she was tolerating soups and other oral intake without significant symptoms.  Patient tells me that her burning epigastric discomfort and sensation of food getting  stuck in the bottom of her throat have resolved.  However she has continued nausea, granddaughter spend the night with her last night said that she had continuous retching all night despite no oral intake.  Family thinks she probably has not had a bowel movement since discharge from the hospital.  Has not been able to keep down her pills, or any liquids since being discharged from the hospital.   Due to her continued symptoms, she contacted the oncology clinic, was advised to come to the ER for further evaluation.  Here in the emergency department, vital signs have been stable, she has been afebrile.  Lab work including CBC and CMP are relatively unremarkable.  She has stable anemia, normal renal function and electrolytes.  Due to continued abdominal pain, CT of the abdomen pelvis was obtained, which did not show any acute findings.   Hospital Course:  #1 intractable nausea vomiting -Leg secondary to non-small cell lung cancer and chemoradiation treatments. -Patient noted to have undergone upper endoscopy 10/07/2022 which noted to have moderate proximal and mid esophagitis with no bleeding felt likely secondary to radiation treatment, multiple gastric polyps biopsied. -GI recommended PPI twice daily and Carafate 4 times daily (2 weeks). -Patient unable to tolerate Carafate during the hospitalization.   -Patient maintained on IV PPI twice daily, GI cocktail as needed, IV antiemetics as well as a Compazine suppository twice daily.   -Patient improved clinically, diet was advanced from clear liquids to full liquid diet to a soft diet which she tolerated.   -Patient will be discharged home in stable and improved condition.     2.  Dehydration -Patient hydrated with  IV fluids and was euvolemic by day of discharge.    3.  Hypokalemia -Likely secondary to GI losses. -Potassium was repleted and was 3.8 by day of discharge.  -Outpatient follow-up.    4.  GERD/esophagitis -Patient was maintained on IV PPI  twice daily during the hospitalization as well as Carafate and GI cocktail as needed.   -Patient will discharge on home regimen of PPI twice daily as well as Carafate.   -Outpatient follow-up with PCP.    5.  Anxiety -Patient placed on Ativan as needed.   6.  Cancer related pain/non-small cell lung cancer -Patient maintained on home pain regimen. -Patient being followed by Dr. Arlis Porta in the outpatient setting who has been informed of patient's admission. -Oncology recommended and has ordered staging CT scans of the chest which were done.. -Outpatient follow-up with oncology.  Procedures: CT abdomen and pelvis 10/16/2022   Consultations: None  Discharge Exam: Vitals:   10/19/22 0545 10/19/22 1433  BP: 116/78 (!) 95/43  Pulse:  (!) 125  Resp: 16 18  Temp: 98.1 F (36.7 C) (!) 97.5 F (36.4 C)  SpO2: 99% 97%    General: NAD Cardiovascular: RRR no murmurs rubs or gallops.  No JVD.  No lower extremity edema. Respiratory: Clear to auscultation bilaterally.  No wheezes, no crackles, no rhonchi.  Fair air movement.  Speaking in full sentences.  Discharge Instructions   Discharge Instructions     Diet general   Complete by: As directed    Soft diet x 2-3 days then advance as tolerated to regular diet.   Increase activity slowly   Complete by: As directed       Allergies as of 10/19/2022       Reactions   Codeine Hives   Surgical Lubricant Hives        Medication List     STOP taking these medications    CO Q-10 PO   Leg Cramps Subl   potassium chloride SA 20 MEQ tablet Commonly known as: KLOR-CON M   promethazine 25 MG suppository Commonly known as: PHENERGAN       TAKE these medications    cyclobenzaprine 5 MG tablet Commonly known as: FLEXERIL Take 1 tablet (5 mg total) by mouth 3 (three) times daily as needed for muscle spasms.   lidocaine 2 % solution Commonly known as: XYLOCAINE Use as directed 15 mLs in the mouth or throat every  6 (six) hours as needed for mouth pain.   Maalox Max 400-400-40 MG/5ML suspension Generic drug: alum & mag hydroxide-simeth Take 15 mLs by mouth every 6 (six) hours as needed for indigestion.   montelukast 10 MG tablet Commonly known as: SINGULAIR TAKE 1 TABLET AT BEDTIME   MULTIVITAMIN ADULT PO Take 2 tablets by mouth daily.   mupirocin ointment 2 % Commonly known as: BACTROBAN Apply 1 application topically 2 (two) times daily. What changed:  when to take this reasons to take this   ondansetron 8 MG disintegrating tablet Commonly known as: ZOFRAN-ODT Take 1 tablet (8 mg total) by mouth every 8 (eight) hours as needed for nausea or vomiting.   pantoprazole 40 MG tablet Commonly known as: Protonix Take 1 tablet (40 mg total) by mouth 2 (two) times daily.   polyethylene glycol 17 g packet Commonly known as: MIRALAX / GLYCOLAX Take 17 g by mouth daily as needed for moderate constipation.   prochlorperazine 25 MG suppository Commonly known as: COMPAZINE Place 1 suppository (25 mg total) rectally every  12 (twelve) hours.   rosuvastatin 5 MG tablet Commonly known as: CRESTOR TAKE 1 TABLET EVERY DAY   sucralfate 1 g tablet Commonly known as: Carafate Take 1 tablet (1 g total) by mouth 4 (four) times daily. Dissolve each tablet in 15 cc water before use.   Trelegy Ellipta 100-62.5-25 MCG/ACT Aepb Generic drug: Fluticasone-Umeclidin-Vilant Inhale 1 puff into the lungs daily. What changed:  when to take this reasons to take this   VITAMIN D PO Take 2 tablets by mouth daily.       Allergies  Allergen Reactions   Codeine Hives   Surgical Lubricant Hives    Follow-up Information     Renee Sofia, PA-C. Schedule an appointment as soon as possible for a visit in 2 week(s).   Specialty: Physician Assistant Contact information: [address] [place] [address] [phone]         Si Gaul, MD. Schedule an appointment as soon as possible for a  visit in 2 week(s).   Specialty: Oncology Why: Follow-up in 1 to 2 weeks or as previously scheduled. Contact information: [address] [place] [address] [phone]                  The results of significant diagnostics from this hospitalization (including imaging, microbiology, ancillary and laboratory) are listed below for reference.    Significant Diagnostic Studies: CT CHEST W CONTRAST  Result Date: 10/18/2022 CLINICAL DATA:  Non-small cell lung cancer EXAM: CT CHEST WITH CONTRAST TECHNIQUE: Multidetector CT imaging of the chest was performed during intravenous contrast administration. RADIATION DOSE REDUCTION: This exam was performed according to the departmental dose-optimization program which includes automated exposure control, adjustment of the mA and/or kV according to patient size and/or use of iterative reconstruction technique. CONTRAST:  75mL OMNIPAQUE IOHEXOL 300 MG/ML  SOLN COMPARISON:  PET-CT dated 07/03/2022 FINDINGS: Cardiovascular: The heart is normal in size. No pericardial effusion. No evidence of thoracic aortic aneurysm. Atherosclerotic calcifications of the aortic arch. Mild three-vessel coronary atherosclerosis. Right chest port terminates at the cavoatrial junction. Mediastinum/Nodes: Mild mediastinal lymphadenopathy, including a dominant 12 mm short axis subcarinal node and 10 mm short axis right hilar node, grossly unchanged. These were hypermetabolic on prior PET, favoring nodal metastases. 1.7 x 3.2 cm low-density lesion along the anterior mediastinum (series 2/image 97), non FDG avid on prior PET and unchanged, benign. Visualized thyroid is unremarkable. Lungs/Pleura: Status post right middle lobectomy. Moderate centrilobular and paraseptal emphysematous changes, upper lung predominant. No suspicious pulmonary nodules. No focal consolidation. No pleural effusion or pneumothorax. Upper Abdomen: Visualized upper abdomen is grossly unremarkable,  noting vascular calcifications. Musculoskeletal: Degenerative changes of the visualized thoracolumbar spine. IMPRESSION: Status post right middle lobectomy. Small mediastinal and right hilar nodal metastases, grossly unchanged. No evidence of new/progressive metastatic disease. Aortic Atherosclerosis (ICD10-I70.0) and Emphysema (ICD10-J43.9). Electronically Signed   By: Charline Bills M.D.   On: 10/18/2022 16:34   CT ABDOMEN PELVIS WO CONTRAST  Result Date: 10/16/2022 CLINICAL DATA:  Abdominal pain, acute, nonlocalized. Nausea and vomiting. Diffuse abdominal pain. EXAM: CT ABDOMEN AND PELVIS WITHOUT CONTRAST TECHNIQUE: Multidetector CT imaging of the abdomen and pelvis was performed following the standard protocol without IV contrast. RADIATION DOSE REDUCTION: This exam was performed according to the departmental dose-optimization program which includes automated exposure control, adjustment of the mA and/or kV according to patient size and/or use of iterative reconstruction technique. COMPARISON:  CT abdomen/pelvis 09/10/2022. FINDINGS: Lower chest: No acute findings.  Coronary artery  calcifications. Hepatobiliary: No focal liver abnormality is seen. No gallstones, gallbladder wall thickening, or biliary dilatation. Pancreas: Unremarkable. No pancreatic ductal dilatation or surrounding inflammatory changes. Spleen: Normal in size without focal abnormality. Adrenals/Urinary Tract: Adrenal glands are unremarkable. Kidneys are normal, without renal calculi, focal lesion, or hydronephrosis. Bladder is unremarkable. Stomach/Bowel: Stomach is within normal limits. Appendix appears normal. No evidence of bowel wall thickening, distention, or inflammatory changes. Vascular/Lymphatic: Aortic atherosclerosis. No enlarged abdominal or pelvic lymph nodes. Reproductive: Uterus and bilateral adnexa are unremarkable. Vaginal pessary in place. Other: No abdominal wall hernia or abnormality. No abdominopelvic ascites.  Musculoskeletal: No acute or significant osseous findings. IMPRESSION: 1. No acute abdominopelvic findings. 2. Coronary artery calcifications. Aortic Atherosclerosis (ICD10-I70.0). Electronically Signed   By: Orvan Falconer M.D.   On: 10/16/2022 18:17   IR IMAGING GUIDED PORT INSERTION  Result Date: 10/10/2022 CLINICAL DATA:  History of lung carcinoma with poor intravenous access and need for porta cath for continued IV access needs and lung cancer therapy. EXAM: IMPLANTED PORT A CATH PLACEMENT WITH ULTRASOUND AND FLUOROSCOPIC GUIDANCE ANESTHESIA/SEDATION: Moderate (conscious) sedation was employed during this procedure. A total of Versed 1.5 mg and Fentanyl 75 mcg was administered intravenously by radiology nursing. Moderate Sedation Time: 32 minutes. The patient's level of consciousness and vital signs were monitored continuously by radiology nursing throughout the procedure under my direct supervision. FLUOROSCOPY: 34 seconds.  1.0 mGy. PROCEDURE: The procedure, risks, benefits, and alternatives were explained to the patient. Questions regarding the procedure were encouraged and answered. The patient understands and consents to the procedure. A time-out was performed prior to initiating the procedure. Ultrasound was utilized to confirm patency of the right internal jugular vein. The right neck and chest were prepped with chlorhexidine in a sterile fashion, and a sterile drape was applied covering the operative field. Maximum barrier sterile technique with sterile gowns and gloves were used for the procedure. Local anesthesia was provided with 1% lidocaine. After creating a small venotomy incision, a 21 gauge needle was advanced into the right internal jugular vein under direct, real-time ultrasound guidance. Ultrasound image documentation was performed. After securing guidewire access, an 8 Fr dilator was placed. A J-wire was kinked to measure appropriate catheter length. A subcutaneous port pocket was then  created along the upper chest wall utilizing sharp and blunt dissection. Portable cautery was utilized. The pocket was irrigated with sterile saline. A single lumen power injectable port was chosen for placement. The 8 Fr catheter was tunneled from the port pocket site to the venotomy incision. The port was placed in the pocket. External catheter was trimmed to appropriate length based on guidewire measurement. At the venotomy, an 8 Fr peel-away sheath was placed over a guidewire. The catheter was then placed through the sheath and the sheath removed. Final catheter positioning was confirmed and documented with a fluoroscopic spot image. The port was accessed with a needle and aspirated and flushed with heparinized saline. The access needle was left in place. The venotomy and port pocket incisions were closed with subcutaneous 3-0 Monocryl and subcuticular 4-0 Vicryl. Dermabond was applied to both incisions. COMPLICATIONS: COMPLICATIONS None FINDINGS: After catheter placement, the tip lies at the cavo-atrial junction. The catheter aspirates normally and is ready for immediate use. IMPRESSION: Placement of single lumen port a cath via right internal jugular vein. The catheter tip lies at the cavo-atrial junction. A power injectable port a cath was placed and is ready for immediate use. Electronically Signed   By: Irish Lack  M.D.   On: 10/10/2022 11:45   IR US Guide Vasc Access Right  Result Date: 10/10/2022 INDICATION: Nonfunctioning peripheral IV and poor venous access. Need for venous access for IV conscious sedation prior to Port-A-Cath placement. EXAM: RIGHT UPPER EXTREMITY VENIPUNCTURE/VENOUS ACCESS PLACEMENT WITH ULTRASOUND GUIDANCE FOR VASCULAR ACCESS. MEDICATIONS: None ANESTHESIA/SEDATION: None FLUOROSCOPY: None COMPLICATIONS: None immediate. PROCEDURE: Informed written consent was obtained from the patient after a thorough discussion of the procedural risks, benefits and alternatives. All questions  were addressed. Maximal Sterile Barrier Technique was utilized including caps, mask, sterile gowns, sterile gloves, sterile drape, hand hygiene and skin antiseptic. A timeout was performed prior to the initiation of the procedure. 1% lidocaine was used for local anesthesia. Ultrasound was used to confirm patency of the right brachial vein in the upper arm. Under ultrasound image was saved. Under ultrasound guidance, a 21 gauge needle was advanced into the right brachial vein with ultrasound image documentation performed. A guidewire was advanced into the vein. A 4 French micropuncture dilator was then advanced into the vein and secured for venous access. The dilator was aspirated and flushed with saline prior to connecting to an IV line. IMPRESSION: Ultrasound-guided venous access performed at the level of the right brachial vein with placement of a 4 French micropuncture dilator for venous access. Electronically Signed   By: Irish Lack M.D.   On: 10/10/2022 11:34   IR Venipuncture 57Yrs/Older By Md  Result Date: 10/10/2022 INDICATION: Nonfunctioning peripheral IV and poor venous access. Need for venous access for IV conscious sedation prior to Port-A-Cath placement. EXAM: RIGHT UPPER EXTREMITY VENIPUNCTURE/VENOUS ACCESS PLACEMENT WITH ULTRASOUND GUIDANCE FOR VASCULAR ACCESS. MEDICATIONS: None ANESTHESIA/SEDATION: None FLUOROSCOPY: None COMPLICATIONS: None immediate. PROCEDURE: Informed written consent was obtained from the patient after a thorough discussion of the procedural risks, benefits and alternatives. All questions were addressed. Maximal Sterile Barrier Technique was utilized including caps, mask, sterile gowns, sterile gloves, sterile drape, hand hygiene and skin antiseptic. A timeout was performed prior to the initiation of the procedure. 1% lidocaine was used for local anesthesia. Ultrasound was used to confirm patency of the right brachial vein in the upper arm. Under ultrasound image was  saved. Under ultrasound guidance, a 21 gauge needle was advanced into the right brachial vein with ultrasound image documentation performed. A guidewire was advanced into the vein. A 4 French micropuncture dilator was then advanced into the vein and secured for venous access. The dilator was aspirated and flushed with saline prior to connecting to an IV line. IMPRESSION: Ultrasound-guided venous access performed at the level of the right brachial vein with placement of a 4 French micropuncture dilator for venous access. Electronically Signed   By: Irish Lack M.D.   On: 10/10/2022 11:34   DG Abd 1 View  Result Date: 10/05/2022 CLINICAL DATA:  Intractable nausea vomiting.  Lung cancer. EXAM: ABDOMEN - 1 VIEW COMPARISON:  CT abdomen pelvis dated 09/10/2022. FINDINGS: No bowel dilatation or evidence of obstruction. No free air. A pessary is noted in the pelvis. Degenerative changes of the spine. No acute osseous pathology. IMPRESSION: Nonobstructive bowel gas pattern. Electronically Signed   By: Elgie Collard M.D.   On: 10/05/2022 17:41    Microbiology: No results found for this or any previous visit (from the past 240 hour(s)).   Labs: Basic Metabolic Panel: Recent Labs  Lab 10/13/22 0516 10/16/22 1449 10/17/22 0535 10/18/22 0440 10/19/22 0426  NA 130* 135 130* 130* 131*  K 3.9 3.6 3.3* 3.4* 3.8  CL  94* 94* 94* 97* 96*  CO2 27 26 24 24 25   GLUCOSE 103* 112* 92 94 86  BUN <5* 6* <5* <5* <5*  CREATININE 0.63 0.75 0.74 0.72 0.82  CALCIUM 8.0* 8.1* 7.9* 7.7* 8.1*  MG 1.7  --   --  2.3 2.1  PHOS  --   --   --  3.1 3.9   Liver Function Tests: Recent Labs  Lab 10/16/22 1449 10/18/22 0440 10/19/22 0426  AST 18  --   --   ALT 16  --   --   ALKPHOS 56  --   --   BILITOT 0.8  --   --   PROT 5.4*  --   --   ALBUMIN 2.4* 2.5* 2.5*   Recent Labs  Lab 10/16/22 1449  LIPASE 35   No results for input(s): "AMMONIA" in the last 168 hours. CBC: Recent Labs  Lab 10/16/22 1449  10/17/22 0535 10/18/22 0440 10/19/22 0426  WBC 6.5 5.2 4.1 4.8  NEUTROABS 4.7  --  2.8  --   HGB 11.1* 10.0* 10.0* 10.8*  HCT 34.3* 31.3* 30.9* 33.3*  MCV 87.5 86.9 87.8 87.4  PLT 177 134* 124* 169   Cardiac Enzymes: No results for input(s): "CKTOTAL", "CKMB", "CKMBINDEX", "TROPONINI" in the last 168 hours. BNP: BNP (last 3 results) No results for input(s): "BNP" in the last 8760 hours.  ProBNP (last 3 results) No results for input(s): "PROBNP" in the last 8760 hours.  CBG: No results for input(s): "GLUCAP" in the last 168 hours.     Signed:  Ramiro Harvest MD.  [facility] 10/19/2022, 2:59 PM

## 2022-10-20 ENCOUNTER — Encounter: Payer: Self-pay | Admitting: *Deleted

## 2022-10-20 ENCOUNTER — Telehealth: Payer: Self-pay | Admitting: *Deleted

## 2022-10-20 ENCOUNTER — Other Ambulatory Visit (HOSPITAL_COMMUNITY): Payer: Self-pay

## 2022-10-20 NOTE — Transitions of Care (Post Inpatient/ED Visit) (Signed)
10/20/2022  Name: [name] MRN: [id number] DOB: 11/17/1950  Today's TOC FU Call Status: Today's TOC FU Call Status:: Successful TOC FU Call Completed TOC FU Call Complete Date: 10/20/22 Patient's Name and Date of Birth confirmed.  Transition Care Management Follow-up Telephone Call Date of Discharge: 10/19/22 Discharge Facility: [facility]) Type of Discharge: Inpatient Admission Primary Inpatient Discharge Diagnosis:: intractable nausea/ vomiting How have you been since you were released from the hospital?: Better Any questions or concerns?: No  Items Reviewed: Did you receive and understand the discharge instructions provided?: Yes (thoroughly reviewed with patient who verbalizes good understanding of same) Medications obtained,verified, and reconciled?: Yes (Medications Reviewed) (Full medication reconciliation/ review completed; no concerns verbalized by patient; reports she self-manages medications and denies questions/ concerns around medications today) Any new allergies since your discharge?: No Dietary orders reviewed?: Yes Type of Diet Ordered:: "I eat light food that I Know will not upset my stomach" Do you have support at home?: Yes People in Home: child(ren), adult, grandchild(ren) Name of Support/Comfort Primary Source: Reports essentially independent in self-care activities- resides with supportive granddaughter who assists as/ if needed; reports family supervises all self-care activities; her daughter lives locally and also assists regularly in her care  Medications Reviewed Today: Medications Reviewed Today     Reviewed by Michaela Corner, RN (Registered Nurse) on 10/20/22 at 1402  Med List Status: <None>   Medication Order Taking? Sig Documenting Provider Last Dose Status Informant  alum & mag hydroxide-simeth (MAALOX MAX) 400-400-40 MG/5ML suspension [id number] No Take 15 mLs by mouth every 6 (six) hours as needed for indigestion.  Patient not taking:  Reported on 10/20/2022   Rodolph Bong, MD Not Taking Active            Med Note Michaela Corner   Fri Oct 20, 2022  1:49 PM) 10/20/22: Reports during [facility] call, she is no longer taking  cyclobenzaprine (FLEXERIL) 5 MG tablet [id number] Yes Take 1 tablet (5 mg total) by mouth 3 (three) times daily as needed for muscle spasms. Rodolph Bong, MD Taking Active            Med Note Michaela Corner   Fri Oct 20, 2022  1:48 PM) 10/20/22: reports during [facility] call has not needed recently  Fluticasone-Umeclidin-Vilant (TRELEGY ELLIPTA) 100-62.5-25 MCG/ACT AEPB [id number] Yes Inhale 1 puff into the lungs daily.  Patient taking differently: Inhale 1 puff into the lungs daily as needed (For shortness of breath).   Janie Morning, NP Taking Active Self, Family Member           Med Note Michaela Corner   Fri Oct 20, 2022  1:54 PM) 10/20/22: Reports during [facility] call uses only prn: reports has not needed "in awhile"  lidocaine (XYLOCAINE) 2 % solution [id number] Yes Use as directed 15 mLs in the mouth or throat every 6 (six) hours as needed for mouth pain. Lonell Grandchild, MD Taking Active Self, Family Member  montelukast (SINGULAIR) 10 MG tablet [id number] Yes TAKE 1 TABLET AT BEDTIME [name], PA-C Taking Active Self, Family Member           Med Note Michaela Corner   Fri Oct 20, 2022  1:50 PM) 10/20/22: Reports during [facility] call, takes only prn  Multiple Vitamin (MULTIVITAMIN ADULT PO) [id number] No Take 2 tablets by mouth daily.  Patient not taking: Reported on 10/16/2022   [provider] Not Taking Active Self, Family Member  mupirocin ointment (  BACTROBAN) 2 % [id number] Yes Apply 1 application topically 2 (two) times daily.  Patient taking differently: Apply 1 application  topically 2 (two) times daily as needed (wound care).   [name], PA-C Taking Active Self, Family Member           Med Note (SATTERFIELD, Vito Berger Oct 05, 2022  3:56 PM) Still has on hand just in case as  needed   ondansetron (ZOFRAN-ODT) 8 MG disintegrating tablet [id number] Yes Take 1 tablet (8 mg total) by mouth every 8 (eight) hours as needed for nausea or vomiting. Rodolph Bong, MD Taking Active            Med Note Michaela Corner   Fri Oct 20, 2022  1:51 PM) 10/20/22: Reports during [facility] call reports not using- stated "it made me throw up when I put it under my tongue"  pantoprazole (PROTONIX) 40 MG tablet [id number] Yes Take 1 tablet (40 mg total) by mouth 2 (two) times daily. Willeen Niece, MD Taking Active Self, Family Member           Med Note Michaela Corner   Fri Oct 20, 2022  1:52 PM) 10/20/22: Reports during TOC call not currently taking: she has 83-months worth of omeprezole and is using that before starting the pantoprazole    polyethylene glycol (MIRALAX / GLYCOLAX) 17 g packet [id number] Yes Take 17 g by mouth daily as needed for moderate constipation. [provider] Taking Active Self, Family Member           Med Note Michaela Corner   Fri Oct 20, 2022  1:53 PM) 10/20/22: reports during [facility] call has not needed recently  prochlorperazine (COMPAZINE) 25 MG suppository [id number] Yes Place 1 suppository (25 mg total) rectally every 12 (twelve) hours. Rodolph Bong, MD Taking Active   rosuvastatin (CRESTOR) 5 MG tablet [id number] Yes TAKE 1 TABLET EVERY DAY [name], PA-C Taking Active Self, Family Member  sucralfate (CARAFATE) 1 g tablet [id number] No Take 1 tablet (1 g total) by mouth 4 (four) times daily. Dissolve each tablet in 15 cc water before use.  Patient not taking: Reported on 10/16/2022   Dorothy Puffer, MD Not Taking Active Self, Family Member           Med Note (SATTERFIELD, Vito Berger Oct 05, 2022  3:52 PM) Patient states she also has the liquid form to take whenever she cannot swallow good, she is as of today (10-05-22)taking the liquid from because she cannot swallow at this time  VITAMIN D PO [id number] No Take 2 tablets by mouth daily.  Patient not  taking: Reported on 10/16/2022   [provider] Not Taking Active Self, Family Member           Home Care and Equipment/Supplies: Were Home Health Services Ordered?: No Any new equipment or medical supplies ordered?: No  Functional Questionnaire: Do you need assistance with bathing/showering or dressing?: Yes (family assists/ supervises all self-care activities) Do you need assistance with meal preparation?: Yes (family prepares all meals) Do you need assistance with eating?: No Do you have difficulty maintaining continence: No Do you need assistance with getting out of bed/getting out of a chair/moving?: Yes (family assists/ supervises all self-care activities) Do you have difficulty managing or taking your medications?: Yes (reports she manages medications independently-- family assists/ supervises all self-care activities)  Follow up appointments reviewed: PCP Follow-up appointment confirmed?: No (sent message to [facility] to schedule post-hospital follow up office visit, as per practice request) MD Provider Line [phone] Given: No (verified well-established with current PCP) Specialist Hospital Follow-up appointment confirmed?: Yes Date of Specialist follow-up appointment?: 10/24/22 Follow-Up Specialty Provider:: oncology provider Do you need transportation to your follow-up appointment?: No Do you understand care options if your condition(s) worsen?: Yes-patient verbalized understanding  SDOH Interventions Today    Flowsheet Row Most Recent Value  SDOH Interventions   Food Insecurity Interventions Intervention Not Indicated  Transportation Interventions Intervention Not Indicated  [reports family members continue to provide transportation]      TOC Interventions Today    Flowsheet Row Most Recent Value  TOC Interventions   TOC Interventions Discussed/Reviewed TOC Interventions Discussed      Interventions Today    Flowsheet Row Most  Recent Value  Chronic Disease   Chronic disease during today's visit Other  [intractable N/V]  General Interventions   General Interventions Discussed/Reviewed General Interventions Discussed, Doctor Visits, Durable Medical Equipment (DME), Communication with, Referral to Nurse  [previously scheduled during 10/16/22 TOC call, with RN CM Care Coordinator for follow up telephone visit on 11/02/22- encouraged patient to engage with RN CM during scheduled telephone visit]  Doctor Visits Discussed/Reviewed Doctor Visits Discussed, PCP, Specialist  Durable Medical Equipment (DME) Val Riles currently requiring/ using assistive devices - walker]  PCP/Specialist Visits Compliance with follow-up visit  Communication with PCP/Specialists, RN  Nutrition Interventions   Nutrition Discussed/Reviewed Nutrition Discussed  Pharmacy Interventions   Pharmacy Dicussed/Reviewed Pharmacy Topics Discussed  [Full medication review with updating medication list in EHR per patient report]  Safety Interventions   Safety Discussed/Reviewed Safety Discussed, Fall Risk      Caryl Pina, RN, BSN, CCRN Alumnus RN CM Care Coordination/ Transition of Care- [facility] Care Management [phone]: direct office

## 2022-10-24 ENCOUNTER — Inpatient Hospital Stay: Payer: Medicare HMO

## 2022-10-24 ENCOUNTER — Inpatient Hospital Stay: Payer: Medicare HMO | Attending: Internal Medicine | Admitting: Internal Medicine

## 2022-10-24 VITALS — BP 145/124 | HR 127 | Temp 97.6°F | Resp 17 | Ht 67.0 in | Wt 131.0 lb

## 2022-10-24 VITALS — BP 128/88 | HR 99 | Resp 18

## 2022-10-24 DIAGNOSIS — C342 Malignant neoplasm of middle lobe, bronchus or lung: Secondary | ICD-10-CM | POA: Insufficient documentation

## 2022-10-24 DIAGNOSIS — Z79899 Other long term (current) drug therapy: Secondary | ICD-10-CM | POA: Diagnosis not present

## 2022-10-24 DIAGNOSIS — E875 Hyperkalemia: Secondary | ICD-10-CM | POA: Insufficient documentation

## 2022-10-24 DIAGNOSIS — E860 Dehydration: Secondary | ICD-10-CM

## 2022-10-24 DIAGNOSIS — C3491 Malignant neoplasm of unspecified part of right bronchus or lung: Secondary | ICD-10-CM

## 2022-10-24 MED ORDER — SODIUM CHLORIDE 0.9 % IV SOLN: Freq: Once | INTRAVENOUS | Status: DC

## 2022-10-24 MED ORDER — MIRTAZAPINE 30 MG PO TABS: 30.0000 mg | ORAL_TABLET | Freq: Every day | ORAL | 2 refills | Status: DC

## 2022-10-24 MED ORDER — METHYLPREDNISOLONE 4 MG PO TBPK: ORAL_TABLET | ORAL | 0 refills | Status: DC

## 2022-10-24 MED ORDER — SODIUM CHLORIDE 0.9 % IV SOLN: Freq: Once | INTRAVENOUS | Status: AC

## 2022-10-24 MED ORDER — ONDANSETRON HCL 4 MG/2ML IJ SOLN
8.0000 mg | Freq: Once | INTRAMUSCULAR | Status: AC
  Administered 2022-10-24: 8 mg via INTRAVENOUS
  Filled 2022-10-24: qty 4

## 2022-10-24 NOTE — Patient Instructions (Signed)
Dehydration, Adult Dehydration is a condition in which there is not enough water or other fluids in the body. This happens when a person loses more fluids than they take in. Important organs cannot work right without the right amount of fluids. Any loss of fluids from the body can cause dehydration. Dehydration can be mild, worse, or very bad. It should be treated right away to keep it from getting very bad. What are the causes? Conditions that cause loss of water in the body. They include: Watery poop (diarrhea). Vomiting. Sweating a lot. Fever. Infection. Peeing (urinating) a lot. Not drinking enough fluids. Certain medicines, such as medicines that take extra fluid out of the body (diuretics). Lack of safe drinking water. Not being able to get enough water and food. What increases the risk? Having a long-term (chronic) illness that has not been treated the right way, such as: Diabetes. Heart disease. Kidney disease. Being 65 years of age or older. Having a disability. Living in a place that is high above the ground or sea (high in altitude). The thinner, drier air causes more fluid loss. Doing exercises that put stress on your body for a long time. Being active when in hot places. What are the signs or symptoms? Symptoms of dehydration depend on how bad it is. Mild or worse dehydration Thirst. Dry lips or dry mouth. Feeling dizzy or light-headed. Muscle cramps. Passing little pee or dark pee. Pee may be the color of tea. Headache. Very bad dehydration Changes in skin. Skin may: Be cold to the touch (clammy). Be blotchy or pale. Not go back to normal right after you pinch it and let it go. Little or no tears, pee, or sweat. Fast breathing. Low blood pressure. Weak pulse. Pulse that is more than 100 beats a minute when you are sitting still. Other changes, such as: Feeling very thirsty. Eyes that look hollow (sunken). Cold hands and feet. Being confused. Being very  tired (lethargic) or having trouble waking from sleep. Losing weight. Loss of consciousness. How is this treated? Treatment for this condition depends on how bad your dehydration is. Treatment should start right away. Do not wait until your condition gets very bad. Very bad dehydration is an emergency. You will need to go to a hospital. Mild or worse dehydration can be treated at home. You may be asked to: Drink more fluids. Drink an oral rehydration solution (ORS). This drink gives you the right amount of fluids, salts, and minerals (electrolytes). Very bad dehydration can be treated: With fluids through an IV tube. By correcting low levels of electrolytes in the body. By treating the problem that caused your dehydration. Follow these instructions at home: Oral rehydration solution If told by your doctor, drink an ORS: Make an ORS. Use instructions on the package. Start by drinking small amounts, about  cup (120 mL) every 5-10 minutes. Slowly drink more until you have had the amount that your doctor said to have.  Eating and drinking  Drink enough clear fluid to keep your pee pale yellow. If you were told to drink an ORS, finish the ORS first. Then, start slowly drinking other clear fluids. Drink fluids such as: Water. Do not drink only water. Doing that can make the salt (sodium) level in your body get too low. Water from ice chips you suck on. Fruit juice that you have added water to (diluted). Low-calorie sports drinks. Eat foods that have the right amounts of salts and minerals, such as bananas, oranges, potatoes,   tomatoes, or spinach. Do not drink alcohol. Avoid drinks that have caffeine or sugar. These include:: High-calorie sports drinks. Fruit juice that you did not add water to. Soda. Coffee or energy drinks. Avoid foods that are greasy or have a lot of fat or sugar. General instructions Take over-the-counter and prescription medicines only as told by your doctor. Do  not take sodium tablets. Doing that can make the salt level in your body get too high. Return to your normal activities as told by your doctor. Ask your doctor what activities are safe for you. Keep all follow-up visits. Your doctor may check and change your treatment. Contact a doctor if: You have pain in your belly (abdomen) and the pain: Gets worse. Stays in one place. You have a rash. You have a stiff neck. You get angry or annoyed more easily than normal. You are more tired or have a harder time waking than normal. You feel weak or dizzy. You feel very thirsty. Get help right away if: You have any symptoms of very bad dehydration. You vomit every time you eat or drink. Your vomiting gets worse, does not go away, or you vomit blood or green stuff. You are getting treatment, but symptoms are getting worse. You have a fever. You have a very bad headache. You have: Diarrhea that gets worse or does not go away. Blood in your poop (stool). This may cause poop to look black and tarry. No pee in 6-8 hours. Only a small amount of pee in 6-8 hours, and the pee is very dark. You have trouble breathing. These symptoms may be an emergency. Get help right away. Call 911. Do not wait to see if the symptoms will go away. Do not drive yourself to the hospital. This information is not intended to replace advice given to you by your health care provider. Make sure you discuss any questions you have with your health care provider. Document Revised: 09/05/2021 Document Reviewed: 09/05/2021 Else[employer] Patient Education  2024 [employer]  

## 2022-10-24 NOTE — Progress Notes (Signed)
[facility] [phone]   [phone]  OFFICE PROGRESS NOTE  [name], PA-C [address] [place] [address]  DIAGNOSIS:  stage IIIA (T2a, N2, M0) non-small cell lung cancer, adenocarcinoma that was initially diagnosed as a stage Ib (T2a, N0, M0) non-small cell lung cancer, adenocarcinoma status post right middle lobectomy on March 29, 2020 and the patient had evidence for disease recurrence in the mediastinal lymph nodes in May 2024.   MOLECULAR STUDY by [facility]:  KRAS G12C mutation [id number] ATRX p228fs*14 EPH81 R56L  PDL1 Expression 100%   PRIOR THERAPY: Concurrent chemoradiation with weekly carboplatin for AUC of 2 and paclitaxel 45 Mg/M2.  First dose August 07, 2022.  Status post 7 cycles.  Last dose of chemotherapy was given on September 18, 2022 and last fraction of radiation on 09/25/2022.  CURRENT THERAPY: Observation.  INTERVAL HISTORY: [name] 72 y.o. female returns to the clinic today for follow-up visit accompanied by her son-in-law.  The patient continues to complain of increasing fatigue and weakness as well as lack of appetite, weight loss, odynophagia and dysphagia.  She was admitted to the hospital several times recently for hydration and management of her condition.  She had upper endoscopy on 10/07/2022 by Dr. 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  There was also multiple gastric polyps that were biopsied and removed.  She denied having any current chest pain, shortness of breath, cough or hemoptysis.  She has persistent nausea and occasional vomiting with no abdominal pain, diarrhea or constipation.  She has no headache or visual changes.  She had repeat CT scan of the chest performed recently and she is here for evaluation and discussion of her scan results and treatment options.  MEDICAL HISTORY: Past Medical History:  Diagnosis Date   Abnormal chest  x-ray 01/19/2020   Acute laryngopharyngitis 03/18/2020   Aortic atherosclerosis (HCC) 01/28/2020   Arthritis    Cardiac murmur 03/01/2020   COPD (chronic obstructive pulmonary disease) (HCC)    Coronary artery calcification seen on CT scan 03/01/2020   Cough 01/12/2020   COVID-19 03/10/2020   Dyspnea    on exertion,after getting covid   Family history of adverse reaction to anesthesia    brothr had n/v   GERD (gastroesophageal reflux disease)    Hiatal hernia    Hyperlipemia    Lung cancer, middle lobe (HCC)    middle lobe removed   Pneumonia    PONV (postoperative nausea and vomiting)    Pre-operative cardiovascular examination 03/01/2020   Right lower lobe lung mass 01/28/2020   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  MEDICATIONS:  Current Outpatient Medications  Medication Sig Dispense Refill   alum & mag hydroxide-simeth (MAALOX MAX) 400-400-40 MG/5ML suspension Take 15 mLs by mouth every 6 (six) hours as needed for indigestion. (Patient not taking: Reported on 10/20/2022) 355 mL 1   cyclobenzaprine (FLEXERIL) 5 MG tablet Take 1 tablet (5 mg total) by mouth 3 (three) times daily as needed for muscle spasms. 20 tablet 0   Fluticasone-Umeclidin-Vilant (TRELEGY ELLIPTA) 100-62.5-25 MCG/ACT AEPB Inhale 1 puff into the lungs daily. (Patient taking differently: Inhale 1 puff into the lungs daily as needed (For shortness of breath).) 2 each 0   lidocaine (XYLOCAINE) 2 % solution Use as directed 15 mLs in the  mouth or throat every 6 (six) hours as needed for mouth pain. 100 mL 0   montelukast (SINGULAIR) 10 MG tablet TAKE 1 TABLET AT BEDTIME 90 tablet 3   Multiple Vitamin (MULTIVITAMIN ADULT PO) Take 2 tablets by mouth daily. (Patient not taking: Reported on 10/16/2022)     mupirocin ointment (BACTROBAN) 2 % Apply 1 application topically 2 (two) times daily. (Patient  taking differently: Apply 1 application  topically 2 (two) times daily as needed (wound care).) 22 g 2   ondansetron (ZOFRAN-ODT) 8 MG disintegrating tablet Take 1 tablet (8 mg total) by mouth every 8 (eight) hours as needed for nausea or vomiting. 30 tablet 2   pantoprazole (PROTONIX) 40 MG tablet Take 1 tablet (40 mg total) by mouth 2 (two) times daily. 60 tablet 0   polyethylene glycol (MIRALAX / GLYCOLAX) 17 g packet Take 17 g by mouth daily as needed for moderate constipation.     prochlorperazine (COMPAZINE) 25 MG suppository Place 1 suppository (25 mg total) rectally every 12 (twelve) hours. 30 suppository 1   rosuvastatin (CRESTOR) 5 MG tablet TAKE 1 TABLET EVERY DAY 90 tablet 3   sucralfate (CARAFATE) 1 g tablet Take 1 tablet (1 g total) by mouth 4 (four) times daily. Dissolve each tablet in 15 cc water before use. (Patient not taking: Reported on 10/16/2022) 120 tablet 2   VITAMIN D PO Take 2 tablets by mouth daily. (Patient not taking: Reported on 10/16/2022)     No current facility-administered medications for this visit.    SURGICAL HISTORY:  Past Surgical History:  Procedure Laterality Date   BIOPSY  10/07/2022   Procedure: BIOPSY;  Surgeon: Lynann Bologna, MD;  Location: [facility];  Service: Gastroenterology;;   CATARACT EXTRACTION Bilateral 2023   ESOPHAGOGASTRODUODENOSCOPY (EGD) WITH PROPOFOL N/A 10/07/2022   Procedure: ESOPHAGOGASTRODUODENOSCOPY (EGD) WITH PROPOFOL;  Surgeon: Lynann Bologna, MD;  Location: [facility];  Service: Gastroenterology;  Laterality: N/A;   IR IMAGING GUIDED PORT INSERTION  10/10/2022   IR US GUIDE VASC ACCESS RIGHT  10/10/2022   IR VENIPUNCTURE 23YRS/OLDER BY MD  10/10/2022   LUNG REMOVAL, PARTIAL  03/29/2020   middle lobe removed   VIDEO BRONCHOSCOPY WITH ENDOBRONCHIAL ULTRASOUND N/A 07/20/2022   Procedure: VIDEO BRONCHOSCOPY WITH ENDOBRONCHIAL ULTRASOUND;  Surgeon: [name], MD;  Location: [facility] OR;  Service: Thoracic;  Laterality: N/A;     REVIEW OF SYSTEMS:  Constitutional: positive for anorexia, fatigue, and weight loss Eyes: negative Ears, nose, mouth, throat, and face: negative Respiratory: negative Cardiovascular: negative Gastrointestinal: positive for dysphagia, nausea, odynophagia, and vomiting Genitourinary:negative Integument/breast: negative Hematologic/lymphatic: negative Musculoskeletal:positive for muscle weakness Neurological: negative Behavioral/Psych: negative Endocrine: negative Allergic/Immunologic: negative   PHYSICAL EXAMINATION: General appearance: alert, cooperative, fatigued, and no distress Head: Normocephalic, without obvious abnormality, atraumatic Neck: no adenopathy, no JVD, supple, symmetrical, trachea midline, and thyroid not enlarged, symmetric, no tenderness/mass/nodules Lymph nodes: Cervical, supraclavicular, and axillary nodes normal. Resp: clear to auscultation bilaterally Back: symmetric, no curvature. ROM normal. No CVA tenderness. Cardio: regular rate and rhythm, S1, S2 normal, no murmur, click, rub or gallop GI: soft, non-tender; bowel sounds normal; no masses,  no organomegaly Extremities: extremities normal, atraumatic, no cyanosis or edema Neurologic: Alert and oriented X 3, normal strength and tone. Normal symmetric reflexes. Normal coordination and gait  ECOG PERFORMANCE STATUS: 1 - Symptomatic but completely ambulatory  Blood pressure (!) 145/124, pulse (!) 127, temperature 97.6 F (36.4 C), temperature source Oral, resp. rate 17, height 5\' 7"  (1.702 m), weight 131  lb (59.4 kg), SpO2 100%.  LABORATORY DATA: Lab Results  Component Value Date   WBC 4.8 10/19/2022   HGB 10.8 (L) 10/19/2022   HCT 33.3 (L) 10/19/2022   MCV 87.4 10/19/2022   PLT 169 10/19/2022      Chemistry      Component Value Date/Time   NA 131 (L) 10/19/2022 0426   NA 139 08/22/2022 1146   K 3.8 10/19/2022 0426   CL 96 (L) 10/19/2022 0426   CO2 25 10/19/2022 0426   BUN <5 (L) 10/19/2022  0426   BUN 10 08/22/2022 1146   CREATININE 0.82 10/19/2022 0426   CREATININE 0.57 10/05/2022 0851      Component Value Date/Time   CALCIUM 8.1 (L) 10/19/2022 0426   ALKPHOS 56 10/16/2022 1449   AST 18 10/16/2022 1449   AST 33 10/05/2022 0851   ALT 16 10/16/2022 1449   ALT 29 10/05/2022 0851   BILITOT 0.8 10/16/2022 1449   BILITOT 0.5 10/05/2022 0851       RADIOGRAPHIC STUDIES: CT CHEST W CONTRAST  Result Date: 10/18/2022 CLINICAL DATA:  Non-small cell lung cancer EXAM: CT CHEST WITH CONTRAST TECHNIQUE: Multidetector CT imaging of the chest was performed during intravenous contrast administration. RADIATION DOSE REDUCTION: This exam was performed according to the departmental dose-optimization program which includes automated exposure control, adjustment of the mA and/or kV according to patient size and/or use of iterative reconstruction technique. CONTRAST:  75mL OMNIPAQUE IOHEXOL 300 MG/ML  SOLN COMPARISON:  PET-CT dated 07/03/2022 FINDINGS: Cardiovascular: The heart is normal in size. No pericardial effusion. No evidence of thoracic aortic aneurysm. Atherosclerotic calcifications of the aortic arch. Mild three-vessel coronary atherosclerosis. Right chest port terminates at the cavoatrial junction. Mediastinum/Nodes: Mild mediastinal lymphadenopathy, including a dominant 12 mm short axis subcarinal node and 10 mm short axis right hilar node, grossly unchanged. These were hypermetabolic on prior PET, favoring nodal metastases. 1.7 x 3.2 cm low-density lesion along the anterior mediastinum (series 2/image 97), non FDG avid on prior PET and unchanged, benign. Visualized thyroid is unremarkable. Lungs/Pleura: Status post right middle lobectomy. Moderate centrilobular and paraseptal emphysematous changes, upper lung predominant. No suspicious pulmonary nodules. No focal consolidation. No pleural effusion or pneumothorax. Upper Abdomen: Visualized upper abdomen is grossly unremarkable, noting  vascular calcifications. Musculoskeletal: Degenerative changes of the visualized thoracolumbar spine. IMPRESSION: Status post right middle lobectomy. Small mediastinal and right hilar nodal metastases, grossly unchanged. No evidence of new/progressive metastatic disease. Aortic Atherosclerosis (ICD10-I70.0) and Emphysema (ICD10-J43.9). Electronically Signed   By: Charline Bills M.D.   On: 10/18/2022 16:34   CT ABDOMEN PELVIS WO CONTRAST  Result Date: 10/16/2022 CLINICAL DATA:  Abdominal pain, acute, nonlocalized. Nausea and vomiting. Diffuse abdominal pain. EXAM: CT ABDOMEN AND PELVIS WITHOUT CONTRAST TECHNIQUE: Multidetector CT imaging of the abdomen and pelvis was performed following the standard protocol without IV contrast. RADIATION DOSE REDUCTION: This exam was performed according to the departmental dose-optimization program which includes automated exposure control, adjustment of the mA and/or kV according to patient size and/or use of iterative reconstruction technique. COMPARISON:  CT abdomen/pelvis 09/10/2022. FINDINGS: Lower chest: No acute findings.  Coronary artery calcifications. Hepatobiliary: No focal liver abnormality is seen. No gallstones, gallbladder wall thickening, or biliary dilatation. Pancreas: Unremarkable. No pancreatic ductal dilatation or surrounding inflammatory changes. Spleen: Normal in size without focal abnormality. Adrenals/Urinary Tract: Adrenal glands are unremarkable. Kidneys are normal, without renal calculi, focal lesion, or hydronephrosis. Bladder is unremarkable. Stomach/Bowel: Stomach is within normal limits. Appendix appears normal. No evidence  of bowel wall thickening, distention, or inflammatory changes. Vascular/Lymphatic: Aortic atherosclerosis. No enlarged abdominal or pelvic lymph nodes. Reproductive: Uterus and bilateral adnexa are unremarkable. Vaginal pessary in place. Other: No abdominal wall hernia or abnormality. No abdominopelvic ascites.  Musculoskeletal: No acute or significant osseous findings. IMPRESSION: 1. No acute abdominopelvic findings. 2. Coronary artery calcifications. Aortic Atherosclerosis (ICD10-I70.0). Electronically Signed   By: Orvan Falconer M.D.   On: 10/16/2022 18:17   IR IMAGING GUIDED PORT INSERTION  Result Date: 10/10/2022 CLINICAL DATA:  History of lung carcinoma with poor intravenous access and need for porta cath for continued IV access needs and lung cancer therapy. EXAM: IMPLANTED PORT A CATH PLACEMENT WITH ULTRASOUND AND FLUOROSCOPIC GUIDANCE ANESTHESIA/SEDATION: Moderate (conscious) sedation was employed during this procedure. A total of Versed 1.5 mg and Fentanyl 75 mcg was administered intravenously by radiology nursing. Moderate Sedation Time: 32 minutes. The patient's level of consciousness and vital signs were monitored continuously by radiology nursing throughout the procedure under my direct supervision. FLUOROSCOPY: 34 seconds.  1.0 mGy. PROCEDURE: The procedure, risks, benefits, and alternatives were explained to the patient. Questions regarding the procedure were encouraged and answered. The patient understands and consents to the procedure. A time-out was performed prior to initiating the procedure. Ultrasound was utilized to confirm patency of the right internal jugular vein. The right neck and chest were prepped with chlorhexidine in a sterile fashion, and a sterile drape was applied covering the operative field. Maximum barrier sterile technique with sterile gowns and gloves were used for the procedure. Local anesthesia was provided with 1% lidocaine. After creating a small venotomy incision, a 21 gauge needle was advanced into the right internal jugular vein under direct, real-time ultrasound guidance. Ultrasound image documentation was performed. After securing guidewire access, an 8 Fr dilator was placed. A J-wire was kinked to measure appropriate catheter length. A subcutaneous port pocket was then  created along the upper chest wall utilizing sharp and blunt dissection. Portable cautery was utilized. The pocket was irrigated with sterile saline. A single lumen power injectable port was chosen for placement. The 8 Fr catheter was tunneled from the port pocket site to the venotomy incision. The port was placed in the pocket. External catheter was trimmed to appropriate length based on guidewire measurement. At the venotomy, an 8 Fr peel-away sheath was placed over a guidewire. The catheter was then placed through the sheath and the sheath removed. Final catheter positioning was confirmed and documented with a fluoroscopic spot image. The port was accessed with a needle and aspirated and flushed with heparinized saline. The access needle was left in place. The venotomy and port pocket incisions were closed with subcutaneous 3-0 Monocryl and subcuticular 4-0 Vicryl. Dermabond was applied to both incisions. COMPLICATIONS: COMPLICATIONS None FINDINGS: After catheter placement, the tip lies at the cavo-atrial junction. The catheter aspirates normally and is ready for immediate use. IMPRESSION: Placement of single lumen port a cath via right internal jugular vein. The catheter tip lies at the cavo-atrial junction. A power injectable port a cath was placed and is ready for immediate use. Electronically Signed   By: Irish Lack M.D.   On: 10/10/2022 11:45   IR US Guide Vasc Access Right  Result Date: 10/10/2022 INDICATION: Nonfunctioning peripheral IV and poor venous access. Need for venous access for IV conscious sedation prior to Port-A-Cath placement. EXAM: RIGHT UPPER EXTREMITY VENIPUNCTURE/VENOUS ACCESS PLACEMENT WITH ULTRASOUND GUIDANCE FOR VASCULAR ACCESS. MEDICATIONS: None ANESTHESIA/SEDATION: None FLUOROSCOPY: None COMPLICATIONS: None immediate. PROCEDURE: Informed written  consent was obtained from the patient after a thorough discussion of the procedural risks, benefits and alternatives. All questions  were addressed. Maximal Sterile Barrier Technique was utilized including caps, mask, sterile gowns, sterile gloves, sterile drape, hand hygiene and skin antiseptic. A timeout was performed prior to the initiation of the procedure. 1% lidocaine was used for local anesthesia. Ultrasound was used to confirm patency of the right brachial vein in the upper arm. Under ultrasound image was saved. Under ultrasound guidance, a 21 gauge needle was advanced into the right brachial vein with ultrasound image documentation performed. A guidewire was advanced into the vein. A 4 French micropuncture dilator was then advanced into the vein and secured for venous access. The dilator was aspirated and flushed with saline prior to connecting to an IV line. IMPRESSION: Ultrasound-guided venous access performed at the level of the right brachial vein with placement of a 4 French micropuncture dilator for venous access. Electronically Signed   By: Irish Lack M.D.   On: 10/10/2022 11:34   IR Venipuncture 74Yrs/Older By Md  Result Date: 10/10/2022 INDICATION: Nonfunctioning peripheral IV and poor venous access. Need for venous access for IV conscious sedation prior to Port-A-Cath placement. EXAM: RIGHT UPPER EXTREMITY VENIPUNCTURE/VENOUS ACCESS PLACEMENT WITH ULTRASOUND GUIDANCE FOR VASCULAR ACCESS. MEDICATIONS: None ANESTHESIA/SEDATION: None FLUOROSCOPY: None COMPLICATIONS: None immediate. PROCEDURE: Informed written consent was obtained from the patient after a thorough discussion of the procedural risks, benefits and alternatives. All questions were addressed. Maximal Sterile Barrier Technique was utilized including caps, mask, sterile gowns, sterile gloves, sterile drape, hand hygiene and skin antiseptic. A timeout was performed prior to the initiation of the procedure. 1% lidocaine was used for local anesthesia. Ultrasound was used to confirm patency of the right brachial vein in the upper arm. Under ultrasound image was  saved. Under ultrasound guidance, a 21 gauge needle was advanced into the right brachial vein with ultrasound image documentation performed. A guidewire was advanced into the vein. A 4 French micropuncture dilator was then advanced into the vein and secured for venous access. The dilator was aspirated and flushed with saline prior to connecting to an IV line. IMPRESSION: Ultrasound-guided venous access performed at the level of the right brachial vein with placement of a 4 French micropuncture dilator for venous access. Electronically Signed   By: Irish Lack M.D.   On: 10/10/2022 11:34   DG Abd 1 View  Result Date: 10/05/2022 CLINICAL DATA:  Intractable nausea vomiting.  Lung cancer. EXAM: ABDOMEN - 1 VIEW COMPARISON:  CT abdomen pelvis dated 09/10/2022. FINDINGS: No bowel dilatation or evidence of obstruction. No free air. A pessary is noted in the pelvis. Degenerative changes of the spine. No acute osseous pathology. IMPRESSION: Nonobstructive bowel gas pattern. Electronically Signed   By: Elgie Collard M.D.   On: 10/05/2022 17:41    ASSESSMENT AND PLAN: This is a very pleasant 72 years old white female with  stage IIIA (T2a, N2, M0) non-small cell lung cancer, adenocarcinoma that was initially diagnosed as a stage Ib (T2a, N0, M0) non-small cell lung cancer, adenocarcinoma status post right middle lobectomy on March 29, 2020 and the patient had evidence for disease recurrence in the mediastinal lymph nodes in May 2024.  Molecular studies by [facility] showed positive KRAS G12C mutation and PD-L1 expression of 100%. The patient is currently undergoing a course of concurrent chemoradiation with weekly carboplatin for AUC of 2 and paclitaxel 45 Mg/M2 status post 7 cycles.  She completed her radiotherapy on 09/25/2022.  The patient had a rough time with this treatment especially with radiation-induced esophagitis with resultant odynophagia, dysphagia, lack of appetite, dehydration and weight loss.   She also has nausea and vomiting. She has several hospitalization recently for hypotension and dehydration. Upper endoscopy performed during her hospitalization showed evidence for radiation-induced esophagitis. The patient had repeat CT scan of the chest performed recently.  I personally and independently reviewed the scan images and discussed the result with the patient and her son-in-law. Her scan showed no concerning findings for disease progression. I had a lengthy discussion with the patient and her son-in-law about her current condition and treatment options. I explained to the patient that under normal circumstances I would have recommended for her consolidation treatment with immunotherapy with Imfinzi 1500 Mg IV every 4 weeks for 1 year but the patient continues to have significant complication from her previous concurrent chemoradiation and may not be eligible to start this treatment anytime soon. I recommended for her to continue on observation for now.  I will see her back for follow-up visit in 2 weeks for reevaluation and if she is feeling better I may consider starting her on the consolidation immunotherapy. For the odynophagia and dysphagia, she is seen by gastroenterology and she has radiation-induced of esophagitis.  I recommended for her to continue with on her pain medication in addition to Maalox and viscous lidocaine. For the lack of appetite and weight loss, I will give her prescription for Medrol Dosepak and I will also start her on Remeron 30 mg p.o. nightly for depression and also to help with her weight loss. For the dehydration and tachycardia, I will arrange for the patient to receive IV hydration with normal saline and 8 mg IV Zofran today and for 2 more days this week. The patient was advised to call immediately if she has any other concerning symptoms in the interval. The patient voices understanding of current disease status and treatment options and is in agreement with  the current care plan.  All questions were answered. The patient knows to call the clinic with any problems, questions or concerns. We can certainly see the patient much sooner if necessary.  The total time spent in the appointment was 55 minutes.  Disclaimer: This note was dictated with voice recognition software. Similar sounding words can inadvertently be transcribed and may not be corrected upon review.

## 2022-10-24 NOTE — Telephone Encounter (Signed)
Called patient unable to leave voicemail due to mailbox being full.

## 2022-10-27 ENCOUNTER — Telehealth: Payer: Self-pay | Admitting: Medical Oncology

## 2022-10-27 ENCOUNTER — Other Ambulatory Visit: Payer: Self-pay | Admitting: Medical Oncology

## 2022-10-27 ENCOUNTER — Inpatient Hospital Stay (HOSPITAL_BASED_OUTPATIENT_CLINIC_OR_DEPARTMENT_OTHER): Payer: Medicare HMO

## 2022-10-27 VITALS — BP 131/75 | HR 89 | Resp 18 | Wt 131.0 lb

## 2022-10-27 DIAGNOSIS — Z95828 Presence of other vascular implants and grafts: Secondary | ICD-10-CM

## 2022-10-27 DIAGNOSIS — Z79899 Other long term (current) drug therapy: Secondary | ICD-10-CM | POA: Diagnosis not present

## 2022-10-27 DIAGNOSIS — E875 Hyperkalemia: Secondary | ICD-10-CM | POA: Diagnosis not present

## 2022-10-27 DIAGNOSIS — E860 Dehydration: Secondary | ICD-10-CM

## 2022-10-27 DIAGNOSIS — C342 Malignant neoplasm of middle lobe, bronchus or lung: Secondary | ICD-10-CM | POA: Diagnosis not present

## 2022-10-27 MED ORDER — SODIUM CHLORIDE 0.9% FLUSH: 3.0000 mL | Freq: Once | INTRAVENOUS | Status: DC | PRN

## 2022-10-27 MED ORDER — SODIUM CHLORIDE 0.9 % IV SOLN: Freq: Once | INTRAVENOUS | Status: AC

## 2022-10-27 MED ORDER — ONDANSETRON HCL 4 MG/2ML IJ SOLN: 8.0000 mg | Freq: Once | INTRAMUSCULAR | Status: DC

## 2022-10-27 MED ORDER — SODIUM CHLORIDE 0.9% FLUSH
10.0000 mL | Freq: Once | INTRAVENOUS | Status: AC | PRN
  Administered 2022-10-27: 10 mL

## 2022-10-27 MED ORDER — SODIUM CHLORIDE 0.9 % IV SOLN
8.0000 mg | Freq: Once | INTRAVENOUS | Status: AC
  Administered 2022-10-27: 8 mg via INTRAVENOUS
  Filled 2022-10-27: qty 4

## 2022-10-27 MED ORDER — HEPARIN SOD (PORK) LOCK FLUSH 100 UNIT/ML IV SOLN
500.0000 [IU] | Freq: Once | INTRAVENOUS | Status: AC | PRN
  Administered 2022-10-27: 500 [IU]

## 2022-10-27 MED ORDER — LIDOCAINE-PRILOCAINE 2.5-2.5 % EX CREA
1.0000 | TOPICAL_CREAM | CUTANEOUS | 0 refills | Status: AC | PRN
Start: 2022-10-27 — End: ?

## 2022-10-27 MED ORDER — SODIUM CHLORIDE 0.9 % IV SOLN: Freq: Once | INTRAVENOUS | Status: DC

## 2022-10-27 MED ORDER — HEPARIN SOD (PORK) LOCK FLUSH 100 UNIT/ML IV SOLN: 250.0000 [IU] | Freq: Once | INTRAVENOUS | Status: DC | PRN

## 2022-10-27 MED ORDER — ALTEPLASE 2 MG IJ SOLR: 2.0000 mg | Freq: Once | INTRAMUSCULAR | Status: DC | PRN

## 2022-10-27 NOTE — Telephone Encounter (Signed)
Dtr said [name] is doing a lot better.She is drinking and eating better.I told dtr to call Monday with update.

## 2022-10-27 NOTE — Patient Instructions (Signed)

## 2022-10-30 ENCOUNTER — Telehealth: Payer: Self-pay

## 2022-10-30 NOTE — Telephone Encounter (Signed)
RN called and spoke with patient's daughter. Per daughter, patient is not nauseous or throwing up, but she is extremely fatigued. She states this usually happens when patient needs IV fluids. Per daughter, patient was eating and drinking ok this weekend but decreased intake yesterday. Appointment made for IV fluids for tomorrow, daughter aware of date, time and location of appointment.

## 2022-10-30 NOTE — Telephone Encounter (Signed)
TC earlier today from pt's daughter [name], stating she would like for pt to be evaluated tomorrow to see if she needs to get IVF. Pt received IVF last week and is reported to be "better overall," but she is still extremely weak. Reports that pt is drinking PO fluids well and denies N/V. Contacted R. Julien Girt, RN for [facility] to see if pt could get a PA visit tomorrow. She states she will contact pt to get more insight into the situation.

## 2022-10-31 ENCOUNTER — Inpatient Hospital Stay: Payer: Medicare HMO

## 2022-10-31 ENCOUNTER — Other Ambulatory Visit (HOSPITAL_COMMUNITY): Payer: Self-pay

## 2022-10-31 ENCOUNTER — Other Ambulatory Visit: Payer: Self-pay

## 2022-10-31 VITALS — BP 128/81 | HR 93 | Temp 97.5°F | Resp 16 | Ht 67.0 in | Wt 131.8 lb

## 2022-10-31 DIAGNOSIS — E875 Hyperkalemia: Secondary | ICD-10-CM | POA: Diagnosis not present

## 2022-10-31 DIAGNOSIS — C342 Malignant neoplasm of middle lobe, bronchus or lung: Secondary | ICD-10-CM | POA: Diagnosis not present

## 2022-10-31 DIAGNOSIS — E860 Dehydration: Secondary | ICD-10-CM

## 2022-10-31 DIAGNOSIS — Z79899 Other long term (current) drug therapy: Secondary | ICD-10-CM | POA: Diagnosis not present

## 2022-10-31 MED ORDER — ONDANSETRON 8 MG/NS 50 ML IVPB: 8.0000 mg | Freq: Once | INTRAVENOUS | Status: DC

## 2022-10-31 MED ORDER — SODIUM CHLORIDE 0.9 % IV SOLN
Freq: Once | INTRAVENOUS | Status: AC
  Filled 2022-10-31: qty 4

## 2022-10-31 MED ORDER — HEPARIN SOD (PORK) LOCK FLUSH 100 UNIT/ML IV SOLN
500.0000 [IU] | Freq: Once | INTRAVENOUS | Status: AC | PRN
  Administered 2022-10-31: 500 [IU]

## 2022-10-31 MED ORDER — ONDANSETRON 16 MG/50ML IVPB (CHCC): 16.0000 mg | Freq: Once | INTRAVENOUS | Status: DC

## 2022-10-31 MED ORDER — SODIUM CHLORIDE 0.9 % IV SOLN: Freq: Once | INTRAVENOUS | Status: AC

## 2022-10-31 MED ORDER — SODIUM CHLORIDE 0.9% FLUSH
10.0000 mL | Freq: Once | INTRAVENOUS | Status: AC | PRN
  Administered 2022-10-31: 10 mL

## 2022-10-31 MED ORDER — ONDANSETRON HCL 4 MG/2ML IJ SOLN: 8.0000 mg | Freq: Once | INTRAMUSCULAR | Status: DC

## 2022-10-31 NOTE — Patient Instructions (Signed)

## 2022-11-02 ENCOUNTER — Ambulatory Visit: Payer: Self-pay

## 2022-11-02 ENCOUNTER — Telehealth: Payer: Self-pay | Admitting: Internal Medicine

## 2022-11-02 NOTE — Patient Outreach (Signed)
  Care Coordination   11/02/2022 Name: [name] MRN: [id number] DOB: 15-Jan-1951   Care Coordination Outreach Attempts:  An unsuccessful telephone outreach was attempted for a scheduled appointment today.  Placed call to patient for follow up TOC. Voicemail was full. Unable to leave a message.   Follow Up Plan:  Additional outreach attempts will be made to offer the patient care coordination information and services.   Encounter Outcome:  No Answer   Care Coordination Interventions:  No, not indicated    Lonia Chimera, RN, BSN, CEN [facility] [employer] [facility] [phone]

## 2022-11-04 ENCOUNTER — Inpatient Hospital Stay: Payer: Medicare HMO

## 2022-11-04 VITALS — BP 97/60 | HR 130 | Temp 97.6°F

## 2022-11-04 DIAGNOSIS — Z79899 Other long term (current) drug therapy: Secondary | ICD-10-CM | POA: Diagnosis not present

## 2022-11-04 DIAGNOSIS — E860 Dehydration: Secondary | ICD-10-CM

## 2022-11-04 DIAGNOSIS — C342 Malignant neoplasm of middle lobe, bronchus or lung: Secondary | ICD-10-CM | POA: Diagnosis not present

## 2022-11-04 DIAGNOSIS — E875 Hyperkalemia: Secondary | ICD-10-CM | POA: Diagnosis not present

## 2022-11-04 MED ORDER — SODIUM CHLORIDE 0.9 % IV SOLN: 8.0000 mg | Freq: Once | INTRAVENOUS | Status: DC

## 2022-11-04 MED ORDER — SODIUM CHLORIDE 0.9 % IV SOLN: Freq: Once | INTRAVENOUS | Status: AC

## 2022-11-04 MED ORDER — ONDANSETRON HCL 4 MG/2ML IJ SOLN
8.0000 mg | Freq: Once | INTRAMUSCULAR | Status: AC
  Administered 2022-11-04: 8 mg via INTRAVENOUS
  Filled 2022-11-04: qty 4

## 2022-11-04 NOTE — Patient Instructions (Signed)

## 2022-11-07 ENCOUNTER — Inpatient Hospital Stay: Payer: Medicare HMO

## 2022-11-07 ENCOUNTER — Telehealth: Payer: Self-pay | Admitting: Internal Medicine

## 2022-11-07 VITALS — BP 108/78 | HR 99 | Temp 98.2°F | Resp 18

## 2022-11-07 DIAGNOSIS — C342 Malignant neoplasm of middle lobe, bronchus or lung: Secondary | ICD-10-CM | POA: Diagnosis not present

## 2022-11-07 DIAGNOSIS — E860 Dehydration: Secondary | ICD-10-CM

## 2022-11-07 DIAGNOSIS — Z79899 Other long term (current) drug therapy: Secondary | ICD-10-CM | POA: Diagnosis not present

## 2022-11-07 DIAGNOSIS — E875 Hyperkalemia: Secondary | ICD-10-CM | POA: Diagnosis not present

## 2022-11-07 MED ORDER — SODIUM CHLORIDE 0.9 % IV SOLN: Freq: Once | INTRAVENOUS | Status: AC

## 2022-11-07 MED ORDER — ONDANSETRON HCL 4 MG/2ML IJ SOLN
8.0000 mg | Freq: Once | INTRAMUSCULAR | Status: AC
  Administered 2022-11-07: 8 mg via INTRAVENOUS
  Filled 2022-11-07: qty 4

## 2022-11-07 MED ORDER — SODIUM CHLORIDE 0.9 % IV SOLN: 8.0000 mg | Freq: Once | INTRAVENOUS | Status: DC

## 2022-11-07 NOTE — Telephone Encounter (Signed)
Called patient regarding 09/18 appointment, patient is notified.

## 2022-11-08 ENCOUNTER — Telehealth: Payer: Self-pay | Admitting: *Deleted

## 2022-11-08 ENCOUNTER — Inpatient Hospital Stay: Payer: Medicare HMO | Admitting: Internal Medicine

## 2022-11-08 ENCOUNTER — Telehealth: Payer: Self-pay | Admitting: Gastroenterology

## 2022-11-08 VITALS — BP 139/81 | HR 121 | Temp 99.8°F | Resp 15 | Ht 67.0 in | Wt 131.8 lb

## 2022-11-08 DIAGNOSIS — C3490 Malignant neoplasm of unspecified part of unspecified bronchus or lung: Secondary | ICD-10-CM

## 2022-11-08 DIAGNOSIS — Z79899 Other long term (current) drug therapy: Secondary | ICD-10-CM | POA: Diagnosis not present

## 2022-11-08 DIAGNOSIS — E875 Hyperkalemia: Secondary | ICD-10-CM | POA: Diagnosis not present

## 2022-11-08 DIAGNOSIS — C342 Malignant neoplasm of middle lobe, bronchus or lung: Secondary | ICD-10-CM | POA: Diagnosis not present

## 2022-11-08 NOTE — Telephone Encounter (Signed)
Inbound call from patients son stating that Dr. Chales Abrahams done a procedure on patient while she was at the hospital and Dr. Chales Abrahams stated he needed to stretch her throat. Son is requesting we schedule that as soon as possible. Wanting to know if she needs a OV first or if she can be scheduled for procedure. Please advise.

## 2022-11-08 NOTE — Progress Notes (Signed)
Care Coordination Note  11/08/2022 Name: [name] MRN: [id number] DOB: 1950/04/23  [name] is a 72 y.o. year old female who is a primary care patient of [name], Cordelia Poche and is actively engaged with the care management team. I reached out to Rhoderick Moody by phone today to assist with re-scheduling an initial visit with the RN Case Manager  Follow up plan: Unsuccessful telephone outreach attempt made. A HIPAA compliant phone message was left for the patient providing contact information and requesting a return call.   Burman Nieves, CCMA Care Coordination Care Guide Direct Dial: [phone]

## 2022-11-08 NOTE — Progress Notes (Signed)
[facility] [phone]   Fax:([phone]  OFFICE PROGRESS NOTE  [name], PA-C [address] [place] [address]  DIAGNOSIS:  stage IIIA (T2a, N2, M0) non-small cell lung cancer, adenocarcinoma that was initially diagnosed as a stage Ib (T2a, N0, M0) non-small cell lung cancer, adenocarcinoma status post right middle lobectomy on March 29, 2020 and the patient had evidence for disease recurrence in the mediastinal lymph nodes in May 2024.   MOLECULAR STUDY by [facility]:  KRAS G12C mutation [id number] ATRX [id number]*14 EPH81 R56L  PDL1 Expression 100%   PRIOR THERAPY: Concurrent chemoradiation with weekly carboplatin for AUC of 2 and paclitaxel 45 Mg/M2.  First dose August 07, 2022.  Status post 7 cycles.  Last dose of chemotherapy was given on September 18, 2022 and last fraction of radiation on 09/25/2022.  CURRENT THERAPY: Observation.  INTERVAL HISTORY: [name] 72 y.o. female  Discussed the use of AI scribe software for clinical note transcription with the patient, who gave verbal consent to proceed.  History of Present Illness   [name], a patient with a history of stage 3 non-small cell lung cancer, presents with persistent swallowing difficulties and associated burning sensation, particularly with the consumption of citrus and high-acid foods. The patient has been managing the discomfort by consuming only warm or room temperature foods. However, the patient reports an inability to consume sufficient quantities of food and drink to maintain weight and hydration levels, leading to feelings of weakness.  Despite these challenges, the patient has not experienced significant weight loss since the last visit. The patient has also been experiencing nausea, particularly after eating, which may be contributing to the reduced food intake. The patient has been taking omeprazole for acid reflux and Carafate, but the nausea persists.  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 The patient also has a hiatal hernia. Both conditions are likely contributing to the current swallowing difficulties.  The patient has seen a gastroenterologist, Dr. Chales Abrahams, who removed three polyps and suggested the possibility of esophageal stretching in the future. The patient is open to this procedure but is reluctant to consider a stomach tube for nutrition.  The patient has been taking iron supplements for years but has stopped due to the swallowing difficulties. The patient has also stopped taking cholesterol medication and vitamins. The patient has been receiving IV fluids for hydration for approximately eight weeks.       MEDICAL HISTORY: Past Medical History:  Diagnosis Date   Abnormal chest x-ray 01/19/2020   Acute laryngopharyngitis 03/18/2020   Aortic atherosclerosis (HCC) 01/28/2020   Arthritis    Cardiac murmur 03/01/2020   COPD (chronic obstructive pulmonary disease) (HCC)    Coronary artery calcification seen on CT scan 03/01/2020   Cough 01/12/2020   COVID-19 03/10/2020   Dyspnea    on exertion,after getting covid   Family history of adverse reaction to anesthesia    brothr had n/v   GERD (gastroesophageal reflux disease)    Hiatal hernia    Hyperlipemia    Lung cancer, middle lobe (HCC)    middle lobe removed   Pneumonia    PONV (postoperative nausea and vomiting)    Pre-operative cardiovascular examination 03/01/2020   Right lower lobe lung mass 01/28/2020   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  MEDICATIONS:  Current Outpatient Medications  Medication Sig Dispense Refill   alum & mag hydroxide-simeth (MAALOX MAX) 400-400-40 MG/5ML suspension Take 15 mLs by mouth every 6 (six) hours as needed for  indigestion. (Patient not taking: Reported on 10/20/2022) 355 mL 1   cyclobenzaprine (FLEXERIL) 5 MG tablet Take 1 tablet (5 mg total) by mouth 3 (three) times daily as needed for muscle spasms. 20 tablet 0   Fluticasone-Umeclidin-Vilant (TRELEGY ELLIPTA) 100-62.5-25 MCG/ACT AEPB Inhale 1 puff into the lungs daily. (Patient taking differently: Inhale 1 puff into the lungs daily as needed (For shortness of breath).) 2 each 0   lidocaine (XYLOCAINE) 2 % solution Use as directed 15 mLs in the mouth or throat every 6 (six) hours as needed for mouth pain. 100 mL 0   lidocaine-prilocaine (EMLA) cream Apply 1 Application topically as needed. Apply 1 hour prior to lab appt. Cover with plastic wrap, 30 g 0   methylPREDNISolone (MEDROL DOSEPAK) 4 MG TBPK tablet Use as instructed 21 tablet 0   mirtazapine (REMERON) 30 MG tablet Take 1 tablet (30 mg total) by mouth at bedtime. 30 tablet 2   montelukast (SINGULAIR) 10 MG tablet TAKE 1 TABLET AT BEDTIME 90 tablet 3   Multiple Vitamin (MULTIVITAMIN ADULT PO) Take 2 tablets by mouth daily. (Patient not taking: Reported on 10/16/2022)     mupirocin ointment (BACTROBAN) 2 % Apply 1 application topically 2 (two) times daily. (Patient taking differently: Apply 1 application  topically 2 (two) times daily as needed (wound care).) 22 g 2   ondansetron (ZOFRAN-ODT) 8 MG disintegrating tablet Take 1 tablet (8 mg total) by mouth every 8 (eight) hours as needed for nausea or vomiting. 30 tablet 2   pantoprazole (PROTONIX) 40 MG tablet Take 1 tablet (40 mg total) by mouth 2 (two) times daily. 60 tablet 0   polyethylene glycol (MIRALAX / GLYCOLAX) 17 g packet Take 17 g by mouth daily as needed for moderate constipation.     prochlorperazine (COMPAZINE) 25 MG suppository Place 1 suppository (25 mg total) rectally every 12 (twelve) hours. 30 suppository 1   rosuvastatin (CRESTOR) 5 MG tablet TAKE 1 TABLET EVERY DAY 90 tablet 3   sucralfate (CARAFATE) 1 g tablet Take 1 tablet (1 g  total) by mouth 4 (four) times daily. Dissolve each tablet in 15 cc water before use. (Patient not taking: Reported on 10/16/2022) 120 tablet 2   VITAMIN D PO Take 2 tablets by mouth daily. (Patient not taking: Reported on 10/16/2022)     No current facility-administered medications for this visit.    SURGICAL HISTORY:  Past Surgical History:  Procedure Laterality Date   BIOPSY  10/07/2022   Procedure: BIOPSY;  Surgeon: Lynann Bologna, MD;  Location: [facility];  Service: Gastroenterology;;   CATARACT EXTRACTION Bilateral 2023   ESOPHAGOGASTRODUODENOSCOPY (EGD) WITH PROPOFOL N/A 10/07/2022   Procedure: ESOPHAGOGASTRODUODENOSCOPY (EGD) WITH PROPOFOL;  Surgeon: Lynann Bologna, MD;  Location: [facility];  Service: Gastroenterology;  Laterality: N/A;   IR IMAGING GUIDED PORT INSERTION  10/10/2022   IR US GUIDE VASC ACCESS RIGHT  10/10/2022   IR VENIPUNCTURE 67YRS/OLDER BY MD  10/10/2022   LUNG REMOVAL, PARTIAL  03/29/2020   middle lobe removed   VIDEO BRONCHOSCOPY WITH ENDOBRONCHIAL ULTRASOUND N/A 07/20/2022   Procedure: VIDEO BRONCHOSCOPY WITH ENDOBRONCHIAL ULTRASOUND;  Surgeon: [name], MD;  Location: [facility] OR;  Service: Thoracic;  Laterality: N/A;    REVIEW OF SYSTEMS:  Constitutional: positive  for anorexia, fatigue, and weight loss Eyes: negative Ears, nose, mouth, throat, and face: negative Respiratory: negative Cardiovascular: negative Gastrointestinal: positive for dysphagia, nausea, and odynophagia Genitourinary:negative Integument/breast: negative Hematologic/lymphatic: negative Musculoskeletal:positive for muscle weakness Neurological: negative Behavioral/Psych: negative Endocrine: negative Allergic/Immunologic: negative   PHYSICAL EXAMINATION: General appearance: alert, cooperative, fatigued, and no distress Head: Normocephalic, without obvious abnormality, atraumatic Neck: no adenopathy, no JVD, supple, symmetrical, trachea midline, and thyroid not enlarged,  symmetric, no tenderness/mass/nodules Lymph nodes: Cervical, supraclavicular, and axillary nodes normal. Resp: clear to auscultation bilaterally Back: symmetric, no curvature. ROM normal. No CVA tenderness. Cardio: regular rate and rhythm, S1, S2 normal, no murmur, click, rub or gallop GI: soft, non-tender; bowel sounds normal; no masses,  no organomegaly Extremities: extremities normal, atraumatic, no cyanosis or edema Neurologic: Alert and oriented X 3, normal strength and tone. Normal symmetric reflexes. Normal coordination and gait  ECOG PERFORMANCE STATUS: 1 - Symptomatic but completely ambulatory  Blood pressure 139/81, pulse (!) 121, temperature 99.8 F (37.7 C), temperature source Tympanic, resp. rate 15, height 5\' 7"  (1.702 m), weight 131 lb 12.8 oz (59.8 kg), SpO2 98%.  LABORATORY DATA: Lab Results  Component Value Date   WBC 4.8 10/19/2022   HGB 10.8 (L) 10/19/2022   HCT 33.3 (L) 10/19/2022   MCV 87.4 10/19/2022   PLT 169 10/19/2022      Chemistry      Component Value Date/Time   NA 131 (L) 10/19/2022 0426   NA 139 08/22/2022 1146   K 3.8 10/19/2022 0426   CL 96 (L) 10/19/2022 0426   CO2 25 10/19/2022 0426   BUN <5 (L) 10/19/2022 0426   BUN 10 08/22/2022 1146   CREATININE 0.82 10/19/2022 0426   CREATININE 0.57 10/05/2022 0851      Component Value Date/Time   CALCIUM 8.1 (L) 10/19/2022 0426   ALKPHOS 56 10/16/2022 1449   AST 18 10/16/2022 1449   AST 33 10/05/2022 0851   ALT 16 10/16/2022 1449   ALT 29 10/05/2022 0851   BILITOT 0.8 10/16/2022 1449   BILITOT 0.5 10/05/2022 0851       RADIOGRAPHIC STUDIES: CT CHEST W CONTRAST  Result Date: 10/18/2022 CLINICAL DATA:  Non-small cell lung cancer EXAM: CT CHEST WITH CONTRAST TECHNIQUE: Multidetector CT imaging of the chest was performed during intravenous contrast administration. RADIATION DOSE REDUCTION: This exam was performed according to the departmental dose-optimization program which includes automated  exposure control, adjustment of the mA and/or kV according to patient size and/or use of iterative reconstruction technique. CONTRAST:  75mL OMNIPAQUE IOHEXOL 300 MG/ML  SOLN COMPARISON:  PET-CT dated 07/03/2022 FINDINGS: Cardiovascular: The heart is normal in size. No pericardial effusion. No evidence of thoracic aortic aneurysm. Atherosclerotic calcifications of the aortic arch. Mild three-vessel coronary atherosclerosis. Right chest port terminates at the cavoatrial junction. Mediastinum/Nodes: Mild mediastinal lymphadenopathy, including a dominant 12 mm short axis subcarinal node and 10 mm short axis right hilar node, grossly unchanged. These were hypermetabolic on prior PET, favoring nodal metastases. 1.7 x 3.2 cm low-density lesion along the anterior mediastinum (series 2/image 97), non FDG avid on prior PET and unchanged, benign. Visualized thyroid is unremarkable. Lungs/Pleura: Status post right middle lobectomy. Moderate centrilobular and paraseptal emphysematous changes, upper lung predominant. No suspicious pulmonary nodules. No focal consolidation. No pleural effusion or pneumothorax. Upper Abdomen: Visualized upper abdomen is grossly unremarkable, noting vascular calcifications. Musculoskeletal: Degenerative changes of the visualized thoracolumbar spine. IMPRESSION: Status post right middle lobectomy. Small mediastinal and right hilar nodal metastases, grossly unchanged.  No evidence of new/progressive metastatic disease. Aortic Atherosclerosis (ICD10-I70.0) and Emphysema (ICD10-J43.9). Electronically Signed   By: Charline Bills M.D.   On: 10/18/2022 16:34   CT ABDOMEN PELVIS WO CONTRAST  Result Date: 10/16/2022 CLINICAL DATA:  Abdominal pain, acute, nonlocalized. Nausea and vomiting. Diffuse abdominal pain. EXAM: CT ABDOMEN AND PELVIS WITHOUT CONTRAST TECHNIQUE: Multidetector CT imaging of the abdomen and pelvis was performed following the standard protocol without IV contrast. RADIATION DOSE  REDUCTION: This exam was performed according to the departmental dose-optimization program which includes automated exposure control, adjustment of the mA and/or kV according to patient size and/or use of iterative reconstruction technique. COMPARISON:  CT abdomen/pelvis 09/10/2022. FINDINGS: Lower chest: No acute findings.  Coronary artery calcifications. Hepatobiliary: No focal liver abnormality is seen. No gallstones, gallbladder wall thickening, or biliary dilatation. Pancreas: Unremarkable. No pancreatic ductal dilatation or surrounding inflammatory changes. Spleen: Normal in size without focal abnormality. Adrenals/Urinary Tract: Adrenal glands are unremarkable. Kidneys are normal, without renal calculi, focal lesion, or hydronephrosis. Bladder is unremarkable. Stomach/Bowel: Stomach is within normal limits. Appendix appears normal. No evidence of bowel wall thickening, distention, or inflammatory changes. Vascular/Lymphatic: Aortic atherosclerosis. No enlarged abdominal or pelvic lymph nodes. Reproductive: Uterus and bilateral adnexa are unremarkable. Vaginal pessary in place. Other: No abdominal wall hernia or abnormality. No abdominopelvic ascites. Musculoskeletal: No acute or significant osseous findings. IMPRESSION: 1. No acute abdominopelvic findings. 2. Coronary artery calcifications. Aortic Atherosclerosis (ICD10-I70.0). Electronically Signed   By: Orvan Falconer M.D.   On: 10/16/2022 18:17   IR IMAGING GUIDED PORT INSERTION  Result Date: 10/10/2022 CLINICAL DATA:  History of lung carcinoma with poor intravenous access and need for porta cath for continued IV access needs and lung cancer therapy. EXAM: IMPLANTED PORT A CATH PLACEMENT WITH ULTRASOUND AND FLUOROSCOPIC GUIDANCE ANESTHESIA/SEDATION: Moderate (conscious) sedation was employed during this procedure. A total of Versed 1.5 mg and Fentanyl 75 mcg was administered intravenously by radiology nursing. Moderate Sedation Time: 32 minutes. The  patient's level of consciousness and vital signs were monitored continuously by radiology nursing throughout the procedure under my direct supervision. FLUOROSCOPY: 34 seconds.  1.0 mGy. PROCEDURE: The procedure, risks, benefits, and alternatives were explained to the patient. Questions regarding the procedure were encouraged and answered. The patient understands and consents to the procedure. A time-out was performed prior to initiating the procedure. Ultrasound was utilized to confirm patency of the right internal jugular vein. The right neck and chest were prepped with chlorhexidine in a sterile fashion, and a sterile drape was applied covering the operative field. Maximum barrier sterile technique with sterile gowns and gloves were used for the procedure. Local anesthesia was provided with 1% lidocaine. After creating a small venotomy incision, a 21 gauge needle was advanced into the right internal jugular vein under direct, real-time ultrasound guidance. Ultrasound image documentation was performed. After securing guidewire access, an 8 Fr dilator was placed. A J-wire was kinked to measure appropriate catheter length. A subcutaneous port pocket was then created along the upper chest wall utilizing sharp and blunt dissection. Portable cautery was utilized. The pocket was irrigated with sterile saline. A single lumen power injectable port was chosen for placement. The 8 Fr catheter was tunneled from the port pocket site to the venotomy incision. The port was placed in the pocket. External catheter was trimmed to appropriate length based on guidewire measurement. At the venotomy, an 8 Fr peel-away sheath was placed over a guidewire. The catheter was then placed through the sheath and the sheath  removed. Final catheter positioning was confirmed and documented with a fluoroscopic spot image. The port was accessed with a needle and aspirated and flushed with heparinized saline. The access needle was left in place.  The venotomy and port pocket incisions were closed with subcutaneous 3-0 Monocryl and subcuticular 4-0 Vicryl. Dermabond was applied to both incisions. COMPLICATIONS: COMPLICATIONS None FINDINGS: After catheter placement, the tip lies at the cavo-atrial junction. The catheter aspirates normally and is ready for immediate use. IMPRESSION: Placement of single lumen port a cath via right internal jugular vein. The catheter tip lies at the cavo-atrial junction. A power injectable port a cath was placed and is ready for immediate use. Electronically Signed   By: Irish Lack M.D.   On: 10/10/2022 11:45   IR US Guide Vasc Access Right  Result Date: 10/10/2022 INDICATION: Nonfunctioning peripheral IV and poor venous access. Need for venous access for IV conscious sedation prior to Port-A-Cath placement. EXAM: RIGHT UPPER EXTREMITY VENIPUNCTURE/VENOUS ACCESS PLACEMENT WITH ULTRASOUND GUIDANCE FOR VASCULAR ACCESS. MEDICATIONS: None ANESTHESIA/SEDATION: None FLUOROSCOPY: None COMPLICATIONS: None immediate. PROCEDURE: Informed written consent was obtained from the patient after a thorough discussion of the procedural risks, benefits and alternatives. All questions were addressed. Maximal Sterile Barrier Technique was utilized including caps, mask, sterile gowns, sterile gloves, sterile drape, hand hygiene and skin antiseptic. A timeout was performed prior to the initiation of the procedure. 1% lidocaine was used for local anesthesia. Ultrasound was used to confirm patency of the right brachial vein in the upper arm. Under ultrasound image was saved. Under ultrasound guidance, a 21 gauge needle was advanced into the right brachial vein with ultrasound image documentation performed. A guidewire was advanced into the vein. A 4 French micropuncture dilator was then advanced into the vein and secured for venous access. The dilator was aspirated and flushed with saline prior to connecting to an IV line. IMPRESSION:  Ultrasound-guided venous access performed at the level of the right brachial vein with placement of a 4 French micropuncture dilator for venous access. Electronically Signed   By: Irish Lack M.D.   On: 10/10/2022 11:34   IR Venipuncture 64Yrs/Older By Md  Result Date: 10/10/2022 INDICATION: Nonfunctioning peripheral IV and poor venous access. Need for venous access for IV conscious sedation prior to Port-A-Cath placement. EXAM: RIGHT UPPER EXTREMITY VENIPUNCTURE/VENOUS ACCESS PLACEMENT WITH ULTRASOUND GUIDANCE FOR VASCULAR ACCESS. MEDICATIONS: None ANESTHESIA/SEDATION: None FLUOROSCOPY: None COMPLICATIONS: None immediate. PROCEDURE: Informed written consent was obtained from the patient after a thorough discussion of the procedural risks, benefits and alternatives. All questions were addressed. Maximal Sterile Barrier Technique was utilized including caps, mask, sterile gowns, sterile gloves, sterile drape, hand hygiene and skin antiseptic. A timeout was performed prior to the initiation of the procedure. 1% lidocaine was used for local anesthesia. Ultrasound was used to confirm patency of the right brachial vein in the upper arm. Under ultrasound image was saved. Under ultrasound guidance, a 21 gauge needle was advanced into the right brachial vein with ultrasound image documentation performed. A guidewire was advanced into the vein. A 4 French micropuncture dilator was then advanced into the vein and secured for venous access. The dilator was aspirated and flushed with saline prior to connecting to an IV line. IMPRESSION: Ultrasound-guided venous access performed at the level of the right brachial vein with placement of a 4 French micropuncture dilator for venous access. Electronically Signed   By: Irish Lack M.D.   On: 10/10/2022 11:34    ASSESSMENT AND PLAN: This is  a very pleasant 72 years old white female with  stage IIIA (T2a, N2, M0) non-small cell lung cancer, adenocarcinoma that was  initially diagnosed as a stage Ib (T2a, N0, M0) non-small cell lung cancer, adenocarcinoma status post right middle lobectomy on March 29, 2020 and the patient had evidence for disease recurrence in the mediastinal lymph nodes in May 2024.  Molecular studies by [facility] showed positive KRAS G12C mutation and PD-L1 expression of 100%. The patient is currently undergoing a course of concurrent chemoradiation with weekly carboplatin for AUC of 2 and paclitaxel 45 Mg/M2 status post 7 cycles.  She completed her radiotherapy on 09/25/2022. The patient had a rough time with this treatment especially with radiation-induced esophagitis with resultant odynophagia, dysphagia, lack of appetite, dehydration and weight loss.  She also has nausea and vomiting. She has several hospitalization recently for hypotension and dehydration.  She received IV hydration at regular basis in the last 2 months. Upper endoscopy performed during her hospitalization showed evidence for radiation-induced esophagitis. The patient has not recovered enough for consideration of consolidation immunotherapy. I recommended for the patient to continue on observation with repeat CT scan of the chest in 3 months for restaging of her disease. Assessment and Plan    Radiation Esophagitis Improved swallowing and reduced burning sensation, but still difficulty with certain foods and maintaining hydration. Difficulty swallowing pills. Discussed potential esophageal dilation by gastroenterologist. -Contact Dr. Urban Gibson office for esophageal dilation procedure. -Continue Carafate and Omeprazole. -Consider taking Zofran 30 minutes before meals to manage nausea.  Malnutrition/Dehydration Difficulty maintaining weight and hydration due to swallowing difficulties. Discussed the limitations of IV fluids and the need for oral intake. -Increase oral intake gradually, focusing on room temperature foods and drinks. -Consider nutritional supplements  such as Ensure or Boost.  Stage 3 Non-Small Cell Lung Cancer Completed chemo-radiation therapy with good response. Discussed the timing and potential risks of consolidation immunotherapy. -Plan for surveillance with labs and scans in 3 months. -Immunotherapy to be considered if disease recurrence.  Hiatal Hernia Contributing to swallowing difficulties and acid reflux symptoms. -Continue Omeprazole for acid reflux management.  General Health Maintenance -Temporarily hold iron and cholesterol medications until swallowing improves. -Follow-up in 3 months with labs and scans prior to visit.  She was advised to call immediately if she has any concerning symptoms in the interval.   The patient voices understanding of current disease status and treatment options and is in agreement with the current care plan.  All questions were answered. The patient knows to call the clinic with any problems, questions or concerns. We can certainly see the patient much sooner if necessary.  The total time spent in the appointment was 40 minutes.  Disclaimer: This note was dictated with voice recognition software. Similar sounding words can inadvertently be transcribed and may not be corrected upon review.

## 2022-11-09 NOTE — Telephone Encounter (Signed)
Left message for patient to call back  

## 2022-11-09 NOTE — Telephone Encounter (Signed)
Spoke with patient's son & he says that after patient's EGD on 10/07/22 Dr. Chales Abrahams recommended patient have dilation d/t dysphagia. She is still able to tolerate some food, but has nausea & poor appetite. Son is unsure of which medications she takes. He thinks she does take viscous lidocaine. Currently undergoing oncology treatment with Dr. Arbutus Ped, and son states MD is recommending patient have dilation ASAP d/t recurrent dehydration. Advised him that Dr. Chales Abrahams is currently out of the office & will not return until 9/30, but will made our on call provider aware and will be back in touch with him w/ any recommendations. Son verbalized all understanding.

## 2022-11-09 NOTE — Telephone Encounter (Signed)
Discussed MD recommendations with patient's son & scheduled patient for an opening on 11/16/22 at 11:30 am with Jill Side, NP.

## 2022-11-09 NOTE — Telephone Encounter (Signed)
She needs a visit this week vs next week APP vs other available provider ? Any open MD spots?   - force fluids - ED if necessary though I reviewed oncology note and she has maintained weight

## 2022-11-10 ENCOUNTER — Other Ambulatory Visit: Payer: Self-pay | Admitting: Radiation Oncology

## 2022-11-13 ENCOUNTER — Encounter: Payer: Self-pay | Admitting: Physician Assistant

## 2022-11-13 ENCOUNTER — Ambulatory Visit (INDEPENDENT_AMBULATORY_CARE_PROVIDER_SITE_OTHER): Payer: Medicare HMO | Admitting: Physician Assistant

## 2022-11-13 VITALS — BP 98/70 | HR 61 | Temp 97.2°F | Ht 67.0 in | Wt 130.2 lb

## 2022-11-13 DIAGNOSIS — K2960 Other gastritis without bleeding: Secondary | ICD-10-CM | POA: Insufficient documentation

## 2022-11-13 DIAGNOSIS — C3491 Malignant neoplasm of unspecified part of right bronchus or lung: Secondary | ICD-10-CM | POA: Diagnosis not present

## 2022-11-13 DIAGNOSIS — R5383 Other fatigue: Secondary | ICD-10-CM

## 2022-11-13 NOTE — Progress Notes (Signed)
Subjective:  Patient ID: [name], female    DOB: [birth year]  Age: 72 y.o. MRN: [id number]  Chief Complaint  Patient presents with   Lung Cancer    HPI Pt in today requesting referral to [facility] to see Dr Gilman Buttner because it will be more convenient for patient and family to bring her to this center.  She has a history of nonsmall cell lung cancer, adenocarcinoma resulting with right middle lobectomy on 03/29/20 - she did have disease recurrence 5/24 in mediastinal lymph nodes She had been undergoing chemo and radiation with Dr Shirline Frees and has completed that She had endoscopy by Dr Chales Abrahams 10/07/22 which showed radiation induced esophagitis She has had admission to hospital for dehydration and has also received several outpatient treatments for IV rehydration She states at this time she is fatigued and still having trouble swallowing but doing better today and has been able to keep down a few bites of food and drinking today  Pt had chest CT and abdominal/pelvic CT which showed no new changes -   Pt last labwork was 10/19/22 and hgb was at 10.8 Pt has appt with GI this Thursday as well     08/22/2022   11:04 AM 08/01/2022    1:07 PM 04/12/2022    3:48 PM 12/08/2021    8:31 AM 08/18/2021   11:33 AM  Depression screen PHQ 2/9  Decreased Interest 0 0 0 0 0  Down, Depressed, Hopeless 0 0 0 0 0  PHQ - 2 Score 0 0 0 0 0  Altered sleeping 0   0   Tired, decreased energy 3   0   Change in appetite 3   0   Feeling bad or failure about yourself  0   0   Trouble concentrating 0   0   Moving slowly or fidgety/restless 0   0   Suicidal thoughts 0   0   PHQ-9 Score 6   0   Difficult doing work/chores Not difficult at all   Not difficult at all         06/09/2020    9:59 AM 12/10/2020    8:18 AM 08/18/2021   11:53 AM 04/12/2022    3:48 PM 08/22/2022   11:04 AM  Fall Risk  Falls in the past year? 0 0 0 0 0  Was there an injury with Fall? 0 0 0 0 0  Fall Risk Category Calculator  0 0 0 0 0  Fall Risk Category (Retired) Low Low Low    (RETIRED) Patient Fall Risk Level Low fall risk Low fall risk Low fall risk    Patient at Risk for Falls Due to No Fall Risks No Fall Risks No Fall Risks No Fall Risks No Fall Risks  Fall risk Follow up Falls evaluation completed Falls evaluation completed Falls evaluation completed Falls evaluation completed Falls evaluation completed     ROS CONSTITUTIONAL:see HPI  CARDIOVASCULAR: Negative for chest pain, dizziness, palpitations and pedal edema.  RESPIRATORY: Negative for recent cough and dyspnea.  GASTROINTESTINAL: see HPI   Current Outpatient Medications:    alum & mag hydroxide-simeth (MAALOX MAX) 400-400-40 MG/5ML suspension, Take 15 mLs by mouth every 6 (six) hours as needed for indigestion., Disp: 355 mL, Rfl: 1   Fluticasone-Umeclidin-Vilant (TRELEGY ELLIPTA) 100-62.5-25 MCG/ACT AEPB, Inhale 1 puff into the lungs daily. (Patient taking differently: Inhale 1 puff into the lungs daily as needed (For shortness of breath).), Disp: 2 each, Rfl: 0  lidocaine (XYLOCAINE) 2 % solution, Use as directed 15 mLs in the mouth or throat every 6 (six) hours as needed for mouth pain., Disp: 100 mL, Rfl: 0   lidocaine-prilocaine (EMLA) cream, Apply 1 Application topically as needed. Apply 1 hour prior to lab appt. Cover with plastic wrap,, Disp: 30 g, Rfl: 0   mirtazapine (REMERON) 30 MG tablet, Take 1 tablet (30 mg total) by mouth at bedtime., Disp: 30 tablet, Rfl: 2   mupirocin ointment (BACTROBAN) 2 %, Apply 1 application topically 2 (two) times daily. (Patient taking differently: Apply 1 application  topically 2 (two) times daily as needed (wound care).), Disp: 22 g, Rfl: 2   ondansetron (ZOFRAN-ODT) 8 MG disintegrating tablet, Take 1 tablet (8 mg total) by mouth every 8 (eight) hours as needed for nausea or vomiting., Disp: 30 tablet, Rfl: 2   polyethylene glycol (MIRALAX / GLYCOLAX) 17 g packet, Take 17 g by mouth daily as needed for  moderate constipation., Disp: , Rfl:    prochlorperazine (COMPAZINE) 25 MG suppository, Place 1 suppository (25 mg total) rectally every 12 (twelve) hours., Disp: 30 suppository, Rfl: 1   pantoprazole (PROTONIX) 40 MG tablet, Take 1 tablet (40 mg total) by mouth 2 (two) times daily., Disp: 60 tablet, Rfl: 0  Past Medical History:  Diagnosis Date   Abnormal chest x-ray 01/19/2020   Acute laryngopharyngitis 03/18/2020   Aortic atherosclerosis (HCC) 01/28/2020   Arthritis    Cardiac murmur 03/01/2020   COPD (chronic obstructive pulmonary disease) (HCC)    Coronary artery calcification seen on CT scan 03/01/2020   Cough 01/12/2020   COVID-19 03/10/2020   Dyspnea    on exertion,after getting covid   Family history of adverse reaction to anesthesia    brothr had n/v   GERD (gastroesophageal reflux disease)    Hiatal hernia    Hyperlipemia    Lung cancer, middle lobe (HCC)    middle lobe removed   Pneumonia    PONV (postoperative nausea and vomiting)    Pre-operative cardiovascular examination 03/01/2020   Right lower lobe lung mass 01/28/2020   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   Visit for screening mammogram 01/28/2020   Objective:  PHYSICAL EXAM:   BP 98/70 (BP Location: Left Arm, Patient Position: Sitting, Cuff Size: Normal)   Pulse 61   Temp (!) 97.2 F (36.2 C) (Temporal)   Ht 5\' 7"  (1.702 m)   Wt 130 lb 3.2 oz (59.1 kg)   SpO2 96%   BMI 20.39 kg/m    GEN: NAD , thin Cardiac: RRR; no murmurs,  Respiratory:  normal respiratory rate and pattern with no distress - normal breath sounds with no rales, rhonchi, wheezes or rubs Skin: warm and dry, no rash  Neuro:  Alert and Oriented x 3, - CN II-Xii grossly intact Psych: euthymic mood, appropriate affect and demeanor  Assessment & Plan:    Primary lung cancer, right (HCC) -     CBC with Differential/Platelet -     Comprehensive metabolic panel -     Iron, TIBC and Ferritin Panel -     Ambulatory  referral to Hematology / Oncology  Other fatigue -     CBC with Differential/Platelet -     Comprehensive metabolic panel -     Iron, TIBC and Ferritin Panel  Radiation gastritis Follow up with GI as scheduled Continue current meds    Follow-up: Return in about 4 weeks (around 12/11/2022) for follow-up.  An After Visit Summary  was printed and given to the patient.  Jettie Pagan [facility] [phone]

## 2022-11-14 ENCOUNTER — Other Ambulatory Visit: Payer: Self-pay | Admitting: Internal Medicine

## 2022-11-14 ENCOUNTER — Inpatient Hospital Stay: Payer: Medicare HMO

## 2022-11-14 ENCOUNTER — Telehealth: Payer: Self-pay | Admitting: *Deleted

## 2022-11-14 ENCOUNTER — Telehealth: Payer: Self-pay | Admitting: Medical Oncology

## 2022-11-14 ENCOUNTER — Telehealth: Payer: Self-pay

## 2022-11-14 DIAGNOSIS — C3491 Malignant neoplasm of unspecified part of right bronchus or lung: Secondary | ICD-10-CM

## 2022-11-14 DIAGNOSIS — E875 Hyperkalemia: Secondary | ICD-10-CM | POA: Diagnosis not present

## 2022-11-14 DIAGNOSIS — E860 Dehydration: Secondary | ICD-10-CM

## 2022-11-14 DIAGNOSIS — Z79899 Other long term (current) drug therapy: Secondary | ICD-10-CM | POA: Diagnosis not present

## 2022-11-14 DIAGNOSIS — C342 Malignant neoplasm of middle lobe, bronchus or lung: Secondary | ICD-10-CM | POA: Diagnosis not present

## 2022-11-14 LAB — CBC WITH DIFFERENTIAL (CANCER CENTER ONLY)
Abs Immature Granulocytes: 0.02 10*3/uL (ref 0.00–0.07)
Basophils Absolute: 0 10*3/uL (ref 0.0–0.1)
Basophils Relative: 0 %
Eosinophils Absolute: 0.1 10*3/uL (ref 0.0–0.5)
Eosinophils Relative: 2 %
HCT: 36 % (ref 36.0–46.0)
Hemoglobin: 11.3 g/dL — ABNORMAL LOW (ref 12.0–15.0)
Immature Granulocytes: 0 %
Lymphocytes Relative: 17 %
Lymphs Abs: 0.8 10*3/uL (ref 0.7–4.0)
MCH: 29.5 pg (ref 26.0–34.0)
MCHC: 31.4 g/dL (ref 30.0–36.0)
MCV: 94 fL (ref 80.0–100.0)
Monocytes Absolute: 0.6 10*3/uL (ref 0.1–1.0)
Monocytes Relative: 13 %
Neutro Abs: 3.2 10*3/uL (ref 1.7–7.7)
Neutrophils Relative %: 68 %
Platelet Count: 292 10*3/uL (ref 150–400)
RBC: 3.83 MIL/uL — ABNORMAL LOW (ref 3.87–5.11)
RDW: 19 % — ABNORMAL HIGH (ref 11.5–15.5)
WBC Count: 4.8 10*3/uL (ref 4.0–10.5)
nRBC: 0 % (ref 0.0–0.2)

## 2022-11-14 LAB — CMP (CANCER CENTER ONLY)
ALT: 13 U/L (ref 0–44)
AST: 20 U/L (ref 15–41)
Albumin: 2.2 g/dL — ABNORMAL LOW (ref 3.5–5.0)
Alkaline Phosphatase: 89 U/L (ref 38–126)
Anion gap: 12 (ref 5–15)
BUN: 6 mg/dL — ABNORMAL LOW (ref 8–23)
CO2: 28 mmol/L (ref 22–32)
Calcium: 7.8 mg/dL — ABNORMAL LOW (ref 8.9–10.3)
Chloride: 93 mmol/L — ABNORMAL LOW (ref 98–111)
Creatinine: 0.5 mg/dL (ref 0.44–1.00)
GFR, Estimated: >60 mL/min (ref >60)
Glucose, Bld: 135 mg/dL — ABNORMAL HIGH (ref 70–99)
Potassium: 2.6 mmol/L — CL (ref 3.5–5.1)
Sodium: 133 mmol/L — ABNORMAL LOW (ref 135–145)
Total Bilirubin: 0.7 mg/dL (ref 0.3–1.2)
Total Protein: 5.8 g/dL — ABNORMAL LOW (ref 6.5–8.1)

## 2022-11-14 MED ORDER — HEPARIN SOD (PORK) LOCK FLUSH 100 UNIT/ML IV SOLN
500.0000 [IU] | Freq: Once | INTRAVENOUS | Status: AC | PRN
  Administered 2022-11-14: 500 [IU]

## 2022-11-14 MED ORDER — SODIUM CHLORIDE 0.9% FLUSH
10.0000 mL | Freq: Once | INTRAVENOUS | Status: AC | PRN
  Administered 2022-11-14: 10 mL

## 2022-11-14 MED ORDER — POTASSIUM CHLORIDE CRYS ER 20 MEQ PO TBCR: 20.0000 meq | EXTENDED_RELEASE_TABLET | Freq: Two times a day (BID) | ORAL | 0 refills | Status: DC

## 2022-11-14 NOTE — Progress Notes (Signed)
Care Coordination Note  11/14/2022 Name: [name] MRN: [id number] DOB: 10-09-1950  [name] is a 72 y.o. year old female who is a primary care patient of [name], Cordelia Poche and is actively engaged with the care management team. I reached out to Rhoderick Moody by phone today to assist with re-scheduling an initial visit with the RN Case Manager  Follow up plan: Telephone appointment with care management team member scheduled for: 11/22/2022  Burman Nieves, [facility] Care Coordination Care Guide Direct Dial: [phone]

## 2022-11-14 NOTE — Progress Notes (Signed)
Care Coordination Note  11/14/2022 Name: [name] MRN: [id number] DOB: 07-24-1950  [name] is a 72 y.o. year old female who is a primary care patient of [name], [name] and is actively engaged with the care management team. I reached out to [name] by phone today to assist with re-scheduling an initial visit with the RN Case Manager  Follow up plan: We have been unable to make contact with the patient for follow up.   [name], CCMA Care Coordination Care Guide Direct Dial: [phone]

## 2022-11-14 NOTE — Telephone Encounter (Signed)
Called patient made her aware that I spoke to Dr. Jerolyn Center nurse Diane and they will contact the cancer center here in [place] to put in the order for fluids and labs. Stated she will call patient once she gets everything scheduled.

## 2022-11-14 NOTE — Telephone Encounter (Signed)
IVF -PCP is requesting for Dr Arbutus Ped  to order labs  and give IVF @ [facility] infusion center today or tomorrow.   . Referral to [facility]-PCP note " Pt  requesting referral to [facility] to see Dr Gilman Buttner because it will be more convenient for patient and family to bring her to this center".

## 2022-11-15 ENCOUNTER — Inpatient Hospital Stay: Payer: Medicare HMO

## 2022-11-15 ENCOUNTER — Telehealth: Payer: Self-pay

## 2022-11-15 ENCOUNTER — Other Ambulatory Visit: Payer: Self-pay

## 2022-11-15 VITALS — BP 108/70 | HR 100 | Temp 97.3°F | Resp 18

## 2022-11-15 DIAGNOSIS — Z79899 Other long term (current) drug therapy: Secondary | ICD-10-CM | POA: Diagnosis not present

## 2022-11-15 DIAGNOSIS — E876 Hypokalemia: Secondary | ICD-10-CM

## 2022-11-15 DIAGNOSIS — E875 Hyperkalemia: Secondary | ICD-10-CM | POA: Diagnosis not present

## 2022-11-15 DIAGNOSIS — C342 Malignant neoplasm of middle lobe, bronchus or lung: Secondary | ICD-10-CM | POA: Diagnosis not present

## 2022-11-15 DIAGNOSIS — E860 Dehydration: Secondary | ICD-10-CM

## 2022-11-15 MED ORDER — POTASSIUM CHLORIDE 10 MEQ/100ML IV SOLN
10.0000 meq | INTRAVENOUS | Status: DC
Start: 2022-11-15 — End: 2022-11-15

## 2022-11-15 MED ORDER — SODIUM CHLORIDE 0.9 % IV SOLN: Freq: Once | INTRAVENOUS | Status: AC

## 2022-11-15 MED ORDER — HEPARIN SOD (PORK) LOCK FLUSH 100 UNIT/ML IV SOLN: 500.0000 [IU] | Freq: Once | INTRAVENOUS | Status: DC | PRN

## 2022-11-15 MED ORDER — SODIUM CHLORIDE 0.9% FLUSH
10.0000 mL | Freq: Once | INTRAVENOUS | Status: AC | PRN
  Administered 2022-11-15: 10 mL

## 2022-11-15 MED ORDER — POTASSIUM CHLORIDE 10 MEQ/100ML IV SOLN
10.0000 meq | INTRAVENOUS | Status: AC
  Administered 2022-11-15 (×4): 10 meq via INTRAVENOUS
  Filled 2022-11-15 (×4): qty 100

## 2022-11-15 NOTE — Patient Instructions (Signed)
Hypokalemia Hypokalemia means that the amount of potassium in the blood is lower than normal. Potassium is a mineral (electrolyte) that helps regulate the amount of fluid in the body. It also stimulates muscle tightening (contraction) and helps nerves work properly. Normally, most of the body's potassium is inside cells, and only a very small amount is in the blood. Because the amount in the blood is so small, minor changes to potassium levels in the blood can be life-threatening. What are the causes? This condition may be caused by: Antibiotic medicine. Diarrhea or vomiting. Taking too much of a medicine that helps you have a bowel movement (laxative) can cause diarrhea and lead to hypokalemia. Chronic kidney disease (CKD). Medicines that help the body get rid of excess fluid (diuretics). Eating disorders, such as anorexia or bulimia. Low magnesium levels in the body. Sweating a lot. What are the signs or symptoms? Symptoms of this condition include: Weakness. Constipation. Fatigue. Muscle cramps. Mental confusion. Skipped heartbeats or irregular heartbeat (palpitations). Tingling or numbness. How is this diagnosed? This condition is diagnosed with a blood test. How is this treated? This condition may be treated by: Taking potassium supplements. Adjusting the medicines that you take. Eating more foods that contain a lot of potassium. If your potassium level is very low, you may need to get potassium through an IV and be monitored in the hospital. Follow these instructions at home: Eating and drinking  Eat a healthy diet. A healthy diet includes fresh fruits and vegetables, whole grains, healthy fats, and lean proteins. If told, eat more foods that contain a lot of potassium. These include: Nuts, such as peanuts and pistachios. Seeds, such as sunflower seeds and pumpkin seeds. Peas, lentils, and lima beans. Whole grain and bran cereals and breads. Fresh fruits and vegetables,  such as apricots, avocado, bananas, cantaloupe, kiwi, oranges, tomatoes, asparagus, and potatoes. Juices, such as orange, tomato, and prune. Lean meats, including fish. Milk and milk products, such as yogurt. General instructions Take over-the-counter and prescription medicines only as told by your health care provider. This includes vitamins, natural food products, and supplements. Keep all follow-up visits. This is important. Contact a health care provider if: You have weakness that gets worse. You feel your heart pounding or racing. You vomit. You have diarrhea. You have diabetes and you have trouble keeping your blood sugar in your target range. Get help right away if: You have chest pain. You have shortness of breath. You have vomiting or diarrhea that lasts for more than 2 days. You faint. These symptoms may be an emergency. Get help right away. Call 911. Do not wait to see if the symptoms will go away. Do not drive yourself to the hospital. Summary Hypokalemia means that the amount of potassium in the blood is lower than normal. This condition is diagnosed with a blood test. Hypokalemia may be treated by taking potassium supplements, adjusting the medicines that you take, or eating more foods that are high in potassium. If your potassium level is very low, you may need to get potassium through an IV and be monitored in the hospital. This information is not intended to replace advice given to you by your health care provider. Make sure you discuss any questions you have with your health care provider. Document Revised: 10/21/2020 Document Reviewed: 10/21/2020 Elsevier Patient Education  2024 Elsevier Inc.  

## 2022-11-15 NOTE — Telephone Encounter (Signed)
Good morning. It looks like this patient is transferring her care to Dr. Melvyn Neth from [facility]. Her K+ is 2.6 from 9/24. Her orders are for 1L NS- does she need K+ ordered for today as well or just the IV fluids? ( Secure chat from Alyssa ) Per Dr. Melvyn Neth, 40 meq of Potassium IV will be fine.

## 2022-11-16 ENCOUNTER — Ambulatory Visit: Payer: Medicare HMO | Admitting: Oncology

## 2022-11-16 ENCOUNTER — Encounter: Payer: Self-pay | Admitting: Nurse Practitioner

## 2022-11-16 ENCOUNTER — Ambulatory Visit (INDEPENDENT_AMBULATORY_CARE_PROVIDER_SITE_OTHER): Payer: Medicare HMO | Admitting: Nurse Practitioner

## 2022-11-16 ENCOUNTER — Other Ambulatory Visit: Payer: Medicare HMO

## 2022-11-16 DIAGNOSIS — R1310 Dysphagia, unspecified: Secondary | ICD-10-CM | POA: Diagnosis not present

## 2022-11-16 NOTE — Patient Instructions (Addendum)
Take Omeprazole 20 mg 2 capsules by mouth twice daily- take 30 minutes before breakfast and dinner  Continue Carafate liquid 1 hours before meals.  Fairlife protein supplement- drink 8-16 oz daily  Due to recent changes in healthcare laws, you may see the results of your imaging and laboratory studies on MyChart before your provider has had a chance to review them.  We understand that in some cases there may be results that are confusing or concerning to you. Not all laboratory results come back in the same time frame and the provider may be waiting for multiple results in order to interpret others.  Please give [name] 48 hours in order for your provider to thoroughly review all the results before contacting the office for clarification of your results.   Thank you for trusting me with your gastrointestinal care!   Alcide Evener, CRNP

## 2022-11-16 NOTE — Progress Notes (Signed)
11/16/2022 [name] [id number] [birth year]   Chief Complaint: Difficulty swallowing   History of Present Illness: [name] is a 72 year old female with a past medical history of arthritis, hyperlipidemia, coronary calcifications per CT, GERD, colon polyps, COPD, nonsmall cell lung cancer, adenocarcinoma s/p right middle lobectomy 03/29/20 with recurrence 5/24 in mediastinal lymph nodes treated with chemo and radiation. She is known by Dr. Chales Abrahams. 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 She is accompanied by her son.   She underwent an EGD 10/07/2022 which showed moderate proximal/mid esophagitis without bleeding, likely from radiation. Esophageal biopsies showed severe acute nonspecific esophagitis with ulceration. Following her EGD, she could not swallow her oral medications including Omeprazole and her oral food intake remained limited, tolerated small portions of soups, grits and gravy. She stated everything tastes bad, metallic taste. Over the past few weeks, she is having less dysphagia and odynophagia and is tolerating soft chicken, pasta and creamed potatoes. She is drinking a few ounces of Fair Life protein supplement. She did not take Pantoprazole 40mg  po bid post EGD as prescribed as she preferred to take Omeprazole 20mg  po bid. She took viscous Lidocaine for a few weeks. She remains on Sucralfate 1gm slurry tid 30 minutes before meal. She uses Phenergan po or PR for nausea and she sometimes uses Zofran which she stated recently works better than when used in the past. She endorses losing 30lbs within the past 6 months.      Latest Ref Rng & Units 11/14/2022    1:20 PM 10/19/2022    4:26 AM 10/18/2022    4:40 AM  CBC  WBC 4.0 - 10.5 K/uL 4.8  4.8  4.1   Hemoglobin 12.0 - 15.0 g/dL 78.2  95.6  21.3   Hematocrit 36.0 - 46.0 % 36.0  33.3  30.9   Platelets 150 - 400 K/uL 292  169  124         Latest Ref Rng &  Units 11/14/2022    1:20 PM 10/19/2022    4:26 AM 10/18/2022    4:40 AM  CMP  Glucose 70 - 99 mg/dL 086  86  94   BUN 8 - 23 mg/dL 6  <5  <5   Creatinine 0.44 - 1.00 mg/dL 5.78  4.69  6.29   Sodium 135 - 145 mmol/L 133  131  130   Potassium 3.5 - 5.1 mmol/L 2.6  3.8  3.4   Chloride 98 - 111 mmol/L 93  96  97   CO2 22 - 32 mmol/L 28  25  24    Calcium 8.9 - 10.3 mg/dL 7.8  8.1  7.7   Total Protein 6.5 - 8.1 g/dL 5.8     Total Bilirubin 0.3 - 1.2 mg/dL 0.7     Alkaline Phos 38 - 126 U/L 89     AST 15 - 41 U/L 20     ALT 0 - 44 U/L 13       Chest CT with contrast 10/18/2022: Status post right middle lobectomy.   Small mediastinal and right hilar nodal metastases, grossly unchanged.   No evidence of new/progressive metastatic disease.   Aortic Atherosclerosis (ICD10-I70.0) and Emphysema (ICD10-J43.9).  CTAP without contrast 10/16/2022: FINDINGS: Lower chest: No acute findings.  Coronary artery calcifications.   Hepatobiliary: No focal liver abnormality is seen. No gallstones, gallbladder wall thickening, or biliary dilatation.   Pancreas: Unremarkable. No pancreatic ductal dilatation or surrounding  inflammatory changes.   Spleen: Normal in size without focal abnormality.   Adrenals/Urinary Tract: Adrenal glands are unremarkable. Kidneys are normal, without renal calculi, focal lesion, or hydronephrosis. Bladder is unremarkable.   Stomach/Bowel: Stomach is within normal limits. Appendix appears normal. No evidence of bowel wall thickening, distention, or inflammatory changes.   Vascular/Lymphatic: Aortic atherosclerosis. No enlarged abdominal or pelvic lymph nodes.   Reproductive: Uterus and bilateral adnexa are unremarkable. Vaginal pessary in place.   Other: No abdominal wall hernia or abnormality. No abdominopelvic ascites.   Musculoskeletal: No acute or significant osseous findings.   IMPRESSION: 1. No acute abdominopelvic findings. 2. Coronary artery  calcifications.  Aortic Atherosclerosis (ICD10-I70.0).    GI PROCEDURES:  EGD 10/07/2022: - Mod proximal/mid esophagitis with no bleeding (likely radiation induced). Biopsied. - Z-line regular, 35 cm from the incisors. - 3 cm hiatal hernia. - Multiple gastric polyps. Biopsied. - Normal examined duodenum. Biopsied  A. DUODENAL BIOPSY:  - Duodenal mucosa with no specific histopathologic changes  - Negative for increased intraepithelial lymphocytes or villous  architectural changes   B. GASTRIC BIOPSY:  - Gastric antral and oxyntic mucosa with no specific histopathologic  changes  - Helicobacter pylori-like organisms are not identified on routine HE  stain   C. GASTRIC POLYP, BIOPSY:  - Fundic gland polyp(s)  - Negative for dysplasia   D. ESOPHAGEAL BIOPSY:  - Severe acute nonspecific esophagitis with ulceration, see comment   Colonoscopy 01/18/2017: 6mm sessile polyp removed from the proximal ascending colon Two 6 and 8 mm polyps removed from the distal ascending colon near the hepatic flexure.   Current Outpatient Medications on File Prior to Visit  Medication Sig Dispense Refill   alum & mag hydroxide-simeth (MAALOX MAX) 400-400-40 MG/5ML suspension Take 15 mLs by mouth every 6 (six) hours as needed for indigestion. 355 mL 1   Fluticasone-Umeclidin-Vilant (TRELEGY ELLIPTA) 100-62.5-25 MCG/ACT AEPB Inhale 1 puff into the lungs daily. (Patient taking differently: Inhale 1 puff into the lungs daily as needed (For shortness of breath).) 2 each 0   lidocaine (XYLOCAINE) 2 % solution Use as directed 15 mLs in the mouth or throat every 6 (six) hours as needed for mouth pain. 100 mL 0   lidocaine-prilocaine (EMLA) cream Apply 1 Application topically as needed. Apply 1 hour prior to lab appt. Cover with plastic wrap, 30 g 0   mirtazapine (REMERON) 30 MG tablet Take 1 tablet (30 mg total) by mouth at bedtime. 30 tablet 2   mupirocin ointment (BACTROBAN) 2 % Apply 1 application topically  2 (two) times daily. (Patient taking differently: Apply 1 application  topically 2 (two) times daily as needed (wound care).) 22 g 2   omeprazole (PRILOSEC) 20 MG capsule Take 20 mg by mouth 2 (two) times daily before a meal. Will start pantoprazole after pt finishes omeprazole.     ondansetron (ZOFRAN-ODT) 8 MG disintegrating tablet Take 1 tablet (8 mg total) by mouth every 8 (eight) hours as needed for nausea or vomiting. 30 tablet 2   polyethylene glycol (MIRALAX / GLYCOLAX) 17 g packet Take 17 g by mouth daily as needed for moderate constipation.     prochlorperazine (COMPAZINE) 25 MG suppository Place 1 suppository (25 mg total) rectally every 12 (twelve) hours. 30 suppository 1   sucralfate (CARAFATE) 1 GM/10ML suspension Take 1 g by mouth 4 (four) times daily -  with meals and at bedtime.     pantoprazole (PROTONIX) 40 MG tablet Take 1 tablet (40 mg total) by mouth  2 (two) times daily. 60 tablet 0   No current facility-administered medications on file prior to visit.   Allergies  Allergen Reactions   Codeine Hives   Surgical Lubricant Hives   Current Medications, Allergies, Past Medical History, Past Surgical History, Family History and Social History were reviewed in [employer] record.  Review of Systems:   Constitutional: Negative for fever, sweats, chills or weight loss.  Respiratory: Negative for shortness of breath.   Cardiovascular: Negative for chest pain, palpitations and leg swelling.  Gastrointestinal: See HPI.  Musculoskeletal: + Back pain. Neurological: Negative for dizziness, headaches or paresthesias.   Physical Exam: BP 124/84   Pulse (!) 120   Ht 5\' 7"  (1.702 m)   Wt 130 lb 8 oz (59.2 kg)   SpO2 (!) 88%   BMI 20.44 kg/m   Wt Readings from Last 3 Encounters:  11/16/22 130 lb 8 oz (59.2 kg)  11/14/22 131 lb 4.8 oz (59.6 kg)  11/13/22 130 lb 3.2 oz (59.1 kg)    General: Chronically ill appearing 72 year old female presents in a  wheelchair in no acute distress. Head: Normocephalic and atraumatic. Eyes: No scleral icterus. Conjunctiva pink . Ears: Normal auditory acuity. Mouth: Dentition intact. No ulcers or lesions.  Lungs: Clear throughout to auscultation. Heart: Regular rate and rhythm, no murmur. Abdomen: Soft, nontender and nondistended. No masses or hepatomegaly. Normal bowel sounds x 4 quadrants.  Rectal: Deferred.  Musculoskeletal: Symmetrical with no gross deformities. Extremities: No edema. Neurological: Alert oriented x 4. No focal deficits.  Psychological: Alert and cooperative. 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  EGD 10/07/2022 showed moderate proximal/mid esophagitis without bleeding, likely from radiation. Esophageal biopsies showed severe acute nonspecific esophagitis with ulceration. She was unable to swallow any medication including PPI x 3 to 4 weeks post EGD. Her esophagitis symptoms have improved over the past few weeks and she is consistently taking Omeprazole 20mg  po  bid, Carafate slurry 1 gm po tid. She is tolerate small quantities of soft foods.  -Increase Omeprazole 20mg  to two capsule (40mg ) po bid to be taken 30 minutes before breakfast and dinner. Note, patient preferred to take Omeprazole instead of Protonix. -Continue Carafate 1 gm po tid -Soft diet as tolerated, Fair Life protein drink 8 to 16 ounces daily  -Follow up with Dr. Chales Abrahams in 4 weeks, to verify if repeat EGD required to assess for esophagitis healing   Weight loss secondary to lung cancer and radiation esophagitis  -Continue to monitor   Normocytic anemia, secondary to chronic illness, lung cancer and inadequate nutritional intake. No overt GI bleeding.   -Continue follow up with oncology   Hypokalemia, K+ 2.6 -Oncology managing, patient transferring care to Dr. Melvyn Neth in [facility], see phone note 11/15/2022

## 2022-11-16 NOTE — Progress Notes (Signed)
[facility]  [address] [name],  [place]  [address] [phone]  Clinic Day:  11/17/2022  Referring physician: [name], PA-C   HISTORY OF PRESENT ILLNESS:  The patient is a 72 y.o. 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  She initially underwent a right middle lobectomy in February 2022.  Due to the mediastinal nodal recurrence in May 2024, the patient underwent definitive chemoradiation with weekly carboplatin/paclitaxel, which was completed in late July 2024.  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  This prevented her from going straight into her Durvalumab immunotherapy for her maintenance therapy.  She comes in today doing somewhat better.  However, dysphagia/odynophagia from her radiation remains fairly prominent.  Due to suboptimal oral intake, the patient has frequently been dehydrated to where she has required IV fluids on numerous occasions.  She feels she needs another course of IV fluids today due to her significant dehydration.  With respect to her lung cancer, she denies having any new respiratory symptoms/findings which concern her for early disease recurrence.  PAST MEDICAL HISTORY:   Past Medical History:  Diagnosis Date   Abnormal chest x-ray 01/19/2020   Acute laryngopharyngitis 03/18/2020   Aortic atherosclerosis (HCC) 01/28/2020   Arthritis    Cardiac murmur 03/01/2020   COPD (chronic obstructive pulmonary disease) (HCC)    Coronary artery calcification seen on CT scan 03/01/2020   Cough 01/12/2020   COVID-19 03/10/2020   Dyspnea    on exertion,after getting covid   Family history of adverse reaction to anesthesia    brothr had n/v   GERD (gastroesophageal reflux disease)    Hiatal hernia    Hyperlipemia    Lung cancer, middle lobe (HCC)     middle lobe removed   Pneumonia    PONV (postoperative nausea and vomiting)    Pre-operative cardiovascular examination 03/01/2020   Right lower lobe lung mass 01/28/2020   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   Visit for screening mammogram 01/28/2020    PAST SURGICAL HISTORY:   Past Surgical History:  Procedure Laterality Date   BIOPSY  10/07/2022   Procedure: BIOPSY;  Surgeon: Lynann Bologna, MD;  Location: [facility];  Service: Gastroenterology;;   CATARACT EXTRACTION Bilateral 2023   ESOPHAGOGASTRODUODENOSCOPY (EGD) WITH PROPOFOL N/A 10/07/2022   Procedure: ESOPHAGOGASTRODUODENOSCOPY (EGD) WITH PROPOFOL;  Surgeon: Lynann Bologna, MD;  Location: [facility];  Service: Gastroenterology;  Laterality: N/A;   IR IMAGING GUIDED PORT INSERTION  10/10/2022   IR US GUIDE VASC ACCESS RIGHT  10/10/2022   IR VENIPUNCTURE 25YRS/OLDER BY MD  10/10/2022   LUNG REMOVAL, PARTIAL  03/29/2020   middle lobe removed   VIDEO BRONCHOSCOPY WITH ENDOBRONCHIAL ULTRASOUND N/A 07/20/2022   Procedure: VIDEO BRONCHOSCOPY WITH ENDOBRONCHIAL ULTRASOUND;  Surgeon: [name], MD;  Location: [facility];  Service: Thoracic;  Laterality: N/A;    CURRENT MEDICATIONS:   Current Outpatient Medications  Medication Sig Dispense Refill   alum & mag hydroxide-simeth (MAALOX MAX) 400-400-40 MG/5ML suspension Take 15 mLs by mouth every 6 (six) hours as needed for indigestion. 355 mL 1   Fluticasone-Umeclidin-Vilant (TRELEGY ELLIPTA) 100-62.5-25 MCG/ACT AEPB Inhale 1 puff into the lungs daily. (Patient taking differently: Inhale 1 puff into the lungs daily as needed (For shortness of breath).) 2 each 0   lidocaine (XYLOCAINE) 2 % solution Use  as directed 15 mLs in the mouth or throat every 6 (six) hours as needed for mouth pain. 100 mL 0   lidocaine-prilocaine (EMLA) cream Apply 1 Application topically as needed. Apply 1 hour prior to lab appt. Cover with plastic wrap, 30 g 0   mirtazapine (REMERON) 30 MG  tablet Take 1 tablet (30 mg total) by mouth at bedtime. 30 tablet 2   mupirocin ointment (BACTROBAN) 2 % Apply 1 application topically 2 (two) times daily. (Patient taking differently: Apply 1 application  topically 2 (two) times daily as needed (wound care).) 22 g 2   omeprazole (PRILOSEC) 20 MG capsule Take 20 mg by mouth 2 (two) times daily before a meal. Will start pantoprazole after pt finishes omeprazole.     ondansetron (ZOFRAN-ODT) 8 MG disintegrating tablet Take 1 tablet (8 mg total) by mouth every 8 (eight) hours as needed for nausea or vomiting. 30 tablet 2   pantoprazole (PROTONIX) 40 MG tablet Take 1 tablet (40 mg total) by mouth 2 (two) times daily. 60 tablet 0   polyethylene glycol (MIRALAX / GLYCOLAX) 17 g packet Take 17 g by mouth daily as needed for moderate constipation.     prochlorperazine (COMPAZINE) 25 MG suppository Place 1 suppository (25 mg total) rectally every 12 (twelve) hours. 30 suppository 1   sucralfate (CARAFATE) 1 GM/10ML suspension Take 1 g by mouth 4 (four) times daily -  with meals and at bedtime.     No current facility-administered medications for this visit.   Facility-Administered Medications Ordered in Other Visits  Medication Dose Route Frequency Provider Last Rate Last Admin   0.9 %  sodium chloride infusion   Intravenous Continuous Samel Bruna A, MD        ALLERGIES:   Allergies  Allergen Reactions   Codeine Hives   Surgical Lubricant Hives    FAMILY HISTORY:   Family History  Problem Relation Age of Onset   Heart disease Mother    Heart attack Mother    Stomach cancer Father    Cancer Father    Diabetes Father    Non-Hodgkin's lymphoma Brother    High Cholesterol Brother    High Cholesterol Brother    Heart disease Brother    Heart attack Brother    Colon cancer Neg Hx    Esophageal cancer Neg Hx    Pancreatic cancer Neg Hx    SOCIAL HISTORY:  The patient was born and raised in [place].  She currently lives in the  [place] community.  She is widowed, with 1 child and 2 grandchildren.  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  She smoked as much as a half a pack of cigarettes daily for 50 years before quitting approximately 8 years ago.  There is no history of alcohol abuse.  REVIEW OF SYSTEMS:  Review of Systems  Constitutional:  Positive for fatigue. Negative for fever.  HENT:   Positive for sore throat and trouble swallowing. Negative for hearing loss.   Eyes:  Negative for eye problems.  Respiratory:  Negative for chest tightness, cough and hemoptysis.   Cardiovascular:  Negative for chest pain and palpitations.  Gastrointestinal:  Positive for abdominal distention. Negative for abdominal pain, blood in stool, constipation, diarrhea, nausea and vomiting.  Endocrine: Negative for hot flashes.  Genitourinary:  Negative for difficulty urinating, dysuria, frequency, hematuria and nocturia.   Musculoskeletal:  Negative for arthralgias, back pain, gait  problem and myalgias.  Skin: Negative.  Negative for itching and rash.  Neurological: Negative.  Negative for dizziness, extremity weakness, gait problem, headaches, light-headedness and numbness.  Hematological: Negative.   Psychiatric/Behavioral: Negative.  Negative for depression and suicidal ideas. The patient is not nervous/anxious.    PHYSICAL EXAM:  Blood pressure 117/67, pulse (!) 121, temperature (!) 97.4 F (36.3 C), resp. rate 16, height 5\' 7"  (1.702 m), weight 131 lb 11.2 oz (59.7 kg), SpO2 91%. Wt Readings from Last 3 Encounters:  11/17/22 131 lb 11.2 oz (59.7 kg)  11/16/22 130 lb 8 oz (59.2 kg)  11/14/22 131 lb 4.8 oz (59.6 kg)   Body mass index is 20.63 kg/m. Performance status (ECOG): 2 - Symptomatic, <50% confined to bed Physical Exam Constitutional:      Appearance: Normal appearance. She is not ill-appearing.     Comments: A chronically ill-appearing woman in no acute distress  HENT:      Mouth/Throat:     Mouth: Mucous membranes are moist.     Pharynx: Oropharynx is clear. No oropharyngeal exudate or posterior oropharyngeal erythema.  Cardiovascular:     Rate and Rhythm: Normal rate and regular rhythm.     Heart sounds: No murmur heard.    No friction rub. No gallop.  Pulmonary:     Effort: Pulmonary effort is normal. No respiratory distress.     Breath sounds: Decreased air movement present. Examination of the right-upper field reveals decreased breath sounds. Examination of the left-upper field reveals decreased breath sounds. Decreased breath sounds present. No wheezing, rhonchi or rales.  Abdominal:     General: Bowel sounds are normal. There is no distension.     Palpations: Abdomen is soft. There is no mass.     Tenderness: There is no abdominal tenderness.  Musculoskeletal:        General: No swelling.     Right lower leg: No edema.     Left lower leg: No edema.  Lymphadenopathy:     Cervical: No cervical adenopathy.     Upper Body:     Right upper body: No supraclavicular or axillary adenopathy.     Left upper body: No supraclavicular or axillary adenopathy.     Lower Body: No right inguinal adenopathy. No left inguinal adenopathy.  Skin:    General: Skin is warm.     Coloration: Skin is not jaundiced.     Findings: No lesion or rash.  Neurological:     General: No focal deficit present.     Mental Status: She is alert and oriented to person, place, and time. Mental status is at baseline.  Psychiatric:        Mood and Affect: Mood normal.        Behavior: Behavior normal.        Thought Content: Thought content normal.    LABS:      Latest Ref Rng & Units 11/14/2022    1:20 PM 10/19/2022    4:26 AM 10/18/2022    4:40 AM  CBC  WBC 4.0 - 10.5 K/uL 4.8  4.8  4.1   Hemoglobin 12.0 - 15.0 g/dL 16.1  09.6  04.5   Hematocrit 36.0 - 46.0 % 36.0  33.3  30.9   Platelets 150 - 400 K/uL 292  169  124       Latest Ref Rng & Units 11/14/2022    1:20 PM  10/19/2022    4:26 AM 10/18/2022    4:40 AM  CMP  Glucose 70 - 99 mg/dL 621  86  94   BUN 8 - 23 mg/dL 6  <5  <5   Creatinine 0.44 - 1.00 mg/dL 3.08  6.57  8.46   Sodium 135 - 145 mmol/L 133  131  130   Potassium 3.5 - 5.1 mmol/L 2.6  3.8  3.4   Chloride 98 - 111 mmol/L 93  96  97   CO2 22 - 32 mmol/L 28  25  24    Calcium 8.9 - 10.3 mg/dL 7.8  8.1  7.7   Total Protein 6.5 - 8.1 g/dL 5.8     Total Bilirubin 0.3 - 1.2 mg/dL 0.7     Alkaline Phos 38 - 126 U/L 89     AST 15 - 41 U/L 20     ALT 0 - 44 U/L 13      ASSESSMENT & PLAN:  A 72 y.o. female who I was asked to consult upon for the continued management of the mediastinal nodal recurrence of her previous stage IB lung adenocarcinoma.  Most of the visit today was spent focusing on quality of life issues.  I will give her 1 L of IV fluids today to make her more euvolemic.  As she was severely hyperkalemic earlier this week, a prescription was written for her to take potassium liquid over the next 2 weeks, particularly as potassium pills have been too difficult for her to swallow.  The patient claims she has enough pain medicine for now.  With respect to her lung cancer management/surveillance, I will have her undergo a chest CT next week to ascertain her new disease baseline.  She will be seen a day later to go over her CT scan images, which will be used to formulate her next course of action.  The patient and her daughter understand all the plans discussed today and are in agreement with them.  I do appreciate [name], PA-C for his new consult.   Jamesia Linnen Kirby Funk, MD

## 2022-11-17 ENCOUNTER — Inpatient Hospital Stay: Payer: Medicare HMO

## 2022-11-17 ENCOUNTER — Inpatient Hospital Stay (INDEPENDENT_AMBULATORY_CARE_PROVIDER_SITE_OTHER): Payer: Medicare HMO | Admitting: Oncology

## 2022-11-17 ENCOUNTER — Other Ambulatory Visit: Payer: Self-pay | Admitting: Oncology

## 2022-11-17 VITALS — BP 150/87 | HR 107 | Temp 97.6°F

## 2022-11-17 VITALS — BP 117/67 | HR 121 | Temp 97.4°F | Resp 16 | Ht 67.0 in | Wt 131.7 lb

## 2022-11-17 DIAGNOSIS — E860 Dehydration: Secondary | ICD-10-CM

## 2022-11-17 DIAGNOSIS — Z79899 Other long term (current) drug therapy: Secondary | ICD-10-CM | POA: Diagnosis not present

## 2022-11-17 DIAGNOSIS — C3491 Malignant neoplasm of unspecified part of right bronchus or lung: Secondary | ICD-10-CM

## 2022-11-17 DIAGNOSIS — C342 Malignant neoplasm of middle lobe, bronchus or lung: Secondary | ICD-10-CM

## 2022-11-17 DIAGNOSIS — E875 Hyperkalemia: Secondary | ICD-10-CM | POA: Diagnosis not present

## 2022-11-17 MED ORDER — SODIUM CHLORIDE 0.9 % IV SOLN: INTRAVENOUS | Status: DC

## 2022-11-17 MED ORDER — SODIUM CHLORIDE 0.9% FLUSH
10.0000 mL | Freq: Once | INTRAVENOUS | Status: AC | PRN
  Administered 2022-11-17: 10 mL

## 2022-11-17 MED ORDER — SODIUM CHLORIDE 0.9 % IV SOLN: Freq: Once | INTRAVENOUS | Status: AC

## 2022-11-17 MED ORDER — HEPARIN SOD (PORK) LOCK FLUSH 100 UNIT/ML IV SOLN
500.0000 [IU] | Freq: Once | INTRAVENOUS | Status: AC | PRN
  Administered 2022-11-17: 500 [IU]

## 2022-11-22 ENCOUNTER — Other Ambulatory Visit: Payer: Self-pay | Admitting: Physician Assistant

## 2022-11-22 ENCOUNTER — Ambulatory Visit: Payer: Self-pay

## 2022-11-22 NOTE — Patient Outreach (Signed)
Care Coordination   11/22/2022 Name: Doan Dunker MRN: [id number] DOB: [birth year]   Care Coordination Outreach Attempts:  An unsuccessful telephone outreach was attempted for a scheduled appointment today.  Follow Up Plan:  Additional outreach attempts will be made to offer the patient care coordination information and services.   Encounter Outcome:  No Answer   Care Coordination Interventions:  No, not indicated    Lonia Chimera, RN, BSN, CEN [facility] [phone]

## 2022-11-23 DIAGNOSIS — R590 Localized enlarged lymph nodes: Secondary | ICD-10-CM | POA: Diagnosis not present

## 2022-11-23 DIAGNOSIS — C3491 Malignant neoplasm of unspecified part of right bronchus or lung: Secondary | ICD-10-CM | POA: Diagnosis not present

## 2022-11-23 DIAGNOSIS — J439 Emphysema, unspecified: Secondary | ICD-10-CM | POA: Diagnosis not present

## 2022-11-23 DIAGNOSIS — I2699 Other pulmonary embolism without acute cor pulmonale: Secondary | ICD-10-CM | POA: Diagnosis not present

## 2022-11-23 LAB — BASIC METABOLIC PANEL
BUN: 4 (ref 4–21)
CO2: 31 — AB (ref 13–22)
Chloride: 94 — AB (ref 99–108)
Creatinine: 0.5 (ref 0.5–1.1)
Glucose: 105
Potassium: 3.1 meq/L — AB (ref 3.5–5.1)
Sodium: 128 — AB (ref 137–147)

## 2022-11-23 LAB — CBC: RBC: 3.78 — AB (ref 3.87–5.11)

## 2022-11-23 LAB — COMPREHENSIVE METABOLIC PANEL
Albumin: 2.7 — AB (ref 3.5–5.0)
Calcium: 7.8 — AB (ref 8.7–10.7)

## 2022-11-23 LAB — CBC AND DIFFERENTIAL
HCT: 35 — AB (ref 36–46)
Hemoglobin: 11.6 — AB (ref 12.0–16.0)
Neutrophils Absolute: 4.56
Platelets: 290 10*3/uL (ref 150–400)
WBC: 6.7

## 2022-11-23 LAB — HEPATIC FUNCTION PANEL
ALT: 15 U/L (ref 7–35)
AST: 35 (ref 13–35)
Alkaline Phosphatase: 123 (ref 25–125)
Bilirubin, Total: 0.7

## 2022-11-24 ENCOUNTER — Inpatient Hospital Stay: Payer: Medicare HMO | Attending: Internal Medicine | Admitting: Oncology

## 2022-11-24 ENCOUNTER — Encounter: Payer: Self-pay | Admitting: Oncology

## 2022-11-24 ENCOUNTER — Other Ambulatory Visit: Payer: Self-pay | Admitting: Oncology

## 2022-11-24 ENCOUNTER — Telehealth: Payer: Self-pay

## 2022-11-24 ENCOUNTER — Other Ambulatory Visit: Payer: Self-pay

## 2022-11-24 ENCOUNTER — Inpatient Hospital Stay: Payer: Medicare HMO

## 2022-11-24 VITALS — BP 100/62 | HR 107 | Temp 97.7°F | Resp 16 | Ht 67.0 in | Wt 132.2 lb

## 2022-11-24 VITALS — BP 115/95 | HR 95 | Temp 97.9°F | Resp 18

## 2022-11-24 DIAGNOSIS — I2699 Other pulmonary embolism without acute cor pulmonale: Secondary | ICD-10-CM | POA: Diagnosis not present

## 2022-11-24 DIAGNOSIS — Z5112 Encounter for antineoplastic immunotherapy: Secondary | ICD-10-CM | POA: Insufficient documentation

## 2022-11-24 DIAGNOSIS — Z79899 Other long term (current) drug therapy: Secondary | ICD-10-CM | POA: Insufficient documentation

## 2022-11-24 DIAGNOSIS — E876 Hypokalemia: Secondary | ICD-10-CM

## 2022-11-24 DIAGNOSIS — Z902 Acquired absence of lung [part of]: Secondary | ICD-10-CM | POA: Diagnosis not present

## 2022-11-24 DIAGNOSIS — E860 Dehydration: Secondary | ICD-10-CM

## 2022-11-24 DIAGNOSIS — C342 Malignant neoplasm of middle lobe, bronchus or lung: Secondary | ICD-10-CM | POA: Diagnosis not present

## 2022-11-24 DIAGNOSIS — Z7901 Long term (current) use of anticoagulants: Secondary | ICD-10-CM | POA: Diagnosis not present

## 2022-11-24 DIAGNOSIS — C3491 Malignant neoplasm of unspecified part of right bronchus or lung: Secondary | ICD-10-CM | POA: Diagnosis not present

## 2022-11-24 DIAGNOSIS — R110 Nausea: Secondary | ICD-10-CM | POA: Insufficient documentation

## 2022-11-24 DIAGNOSIS — E871 Hypo-osmolality and hyponatremia: Secondary | ICD-10-CM

## 2022-11-24 MED ORDER — SODIUM CHLORIDE 0.9 % IV SOLN: INTRAVENOUS | Status: DC

## 2022-11-24 MED ORDER — PROMETHAZINE HCL 25 MG PO TABS: 25.0000 mg | ORAL_TABLET | Freq: Four times a day (QID) | ORAL | 3 refills | Status: DC | PRN

## 2022-11-24 MED ORDER — POTASSIUM CHLORIDE 10 MEQ/100ML IV SOLN
10.0000 meq | INTRAVENOUS | Status: AC
  Administered 2022-11-24 (×2): 10 meq via INTRAVENOUS
  Filled 2022-11-24 (×2): qty 100

## 2022-11-24 MED ORDER — POTASSIUM CHLORIDE 10 MEQ/100ML IV SOLN: 10.0000 meq | INTRAVENOUS | Status: AC

## 2022-11-24 MED ORDER — SODIUM CHLORIDE 0.9 % IV SOLN: Freq: Once | INTRAVENOUS | Status: AC

## 2022-11-24 MED ORDER — APIXABAN (ELIQUIS) VTE STARTER PACK (10MG AND 5MG)
ORAL_TABLET | ORAL | 0 refills | Status: DC
Start: 2022-11-24 — End: 2023-01-08

## 2022-11-24 MED ORDER — ONDANSETRON HCL 4 MG/2ML IJ SOLN
8.0000 mg | Freq: Once | INTRAMUSCULAR | Status: AC
  Administered 2022-11-24: 8 mg via INTRAVENOUS
  Filled 2022-11-24: qty 4

## 2022-11-24 NOTE — Patient Instructions (Signed)
Dehydration, Adult Dehydration is a condition in which there is not enough water or other fluids in the body. This happens when a person loses more fluids than they take in. Important organs cannot work right without the right amount of fluids. Any loss of fluids from the body can cause dehydration. Dehydration can be mild, worse, or very bad. It should be treated right away to keep it from getting very bad. What are the causes? Conditions that cause loss of water in the body. They include: Watery poop (diarrhea). Vomiting. Sweating a lot. Fever. Infection. Peeing (urinating) a lot. Not drinking enough fluids. Certain medicines, such as medicines that take extra fluid out of the body (diuretics). Lack of safe drinking water. Not being able to get enough water and food. What increases the risk? Having a long-term (chronic) illness that has not been treated the right way, such as: Diabetes. Heart disease. Kidney disease. Being 65 years of age or older. Having a disability. Living in a place that is high above the ground or sea (high in altitude). The thinner, drier air causes more fluid loss. Doing exercises that put stress on your body for a long time. Being active when in hot places. What are the signs or symptoms? Symptoms of dehydration depend on how bad it is. Mild or worse dehydration Thirst. Dry lips or dry mouth. Feeling dizzy or light-headed. Muscle cramps. Passing little pee or dark pee. Pee may be the color of tea. Headache. Very bad dehydration Changes in skin. Skin may: Be cold to the touch (clammy). Be blotchy or pale. Not go back to normal right after you pinch it and let it go. Little or no tears, pee, or sweat. Fast breathing. Low blood pressure. Weak pulse. Pulse that is more than 100 beats a minute when you are sitting still. Other changes, such as: Feeling very thirsty. Eyes that look hollow (sunken). Cold hands and feet. Being confused. Being very  tired (lethargic) or having trouble waking from sleep. Losing weight. Loss of consciousness. How is this treated? Treatment for this condition depends on how bad your dehydration is. Treatment should start right away. Do not wait until your condition gets very bad. Very bad dehydration is an emergency. You will need to go to a hospital. Mild or worse dehydration can be treated at home. You may be asked to: Drink more fluids. Drink an oral rehydration solution (ORS). This drink gives you the right amount of fluids, salts, and minerals (electrolytes). Very bad dehydration can be treated: With fluids through an IV tube. By correcting low levels of electrolytes in the body. By treating the problem that caused your dehydration. Follow these instructions at home: Oral rehydration solution If told by your doctor, drink an ORS: Make an ORS. Use instructions on the package. Start by drinking small amounts, about  cup (120 mL) every 5-10 minutes. Slowly drink more until you have had the amount that your doctor said to have.  Eating and drinking  Drink enough clear fluid to keep your pee pale yellow. If you were told to drink an ORS, finish the ORS first. Then, start slowly drinking other clear fluids. Drink fluids such as: Water. Do not drink only water. Doing that can make the salt (sodium) level in your body get too low. Water from ice chips you suck on. Fruit juice that you have added water to (diluted). Low-calorie sports drinks. Eat foods that have the right amounts of salts and minerals, such as bananas, oranges, potatoes,   tomatoes, or spinach. Do not drink alcohol. Avoid drinks that have caffeine or sugar. These include:: High-calorie sports drinks. Fruit juice that you did not add water to. Soda. Coffee or energy drinks. Avoid foods that are greasy or have a lot of fat or sugar. General instructions Take over-the-counter and prescription medicines only as told by your doctor. Do  not take sodium tablets. Doing that can make the salt level in your body get too high. Return to your normal activities as told by your doctor. Ask your doctor what activities are safe for you. Keep all follow-up visits. Your doctor may check and change your treatment. Contact a doctor if: You have pain in your belly (abdomen) and the pain: Gets worse. Stays in one place. You have a rash. You have a stiff neck. You get angry or annoyed more easily than normal. You are more tired or have a harder time waking than normal. You feel weak or dizzy. You feel very thirsty. Get help right away if: You have any symptoms of very bad dehydration. You vomit every time you eat or drink. Your vomiting gets worse, does not go away, or you vomit blood or green stuff. You are getting treatment, but symptoms are getting worse. You have a fever. You have a very bad headache. You have: Diarrhea that gets worse or does not go away. Blood in your poop (stool). This may cause poop to look black and tarry. No pee in 6-8 hours. Only a small amount of pee in 6-8 hours, and the pee is very dark. You have trouble breathing. These symptoms may be an emergency. Get help right away. Call 911. Do not wait to see if the symptoms will go away. Do not drive yourself to the hospital. This information is not intended to replace advice given to you by your health care provider. Make sure you discuss any questions you have with your health care provider. Document Revised: 09/05/2021 Document Reviewed: 09/05/2021 Else[employer] Patient Education  2024 [employer]  

## 2022-11-24 NOTE — Progress Notes (Signed)
DISCONTINUE ON PATHWAY REGIMEN - Non-Small Cell Lung     A cycle is every 7 days, concurrent with RT:     Paclitaxel      Carboplatin   **Always confirm dose/schedule in your pharmacy ordering system**  REASON: Continuation Of Treatment PRIOR TREATMENT: [id number]: Carboplatin AUC=2 + Paclitaxel 45 mg/m2 Weekly During Radiation TREATMENT RESPONSE: Partial Response (PR)  START ON PATHWAY REGIMEN - Non-Small Cell Lung     A cycle is every 28 days:     Durvalumab   **Always confirm dose/schedule in your pharmacy ordering system**  Patient Characteristics: Preoperative or Nonsurgical Candidate (Clinical Staging), Stage III - Nonsurgical Candidate (Nonsquamous and Squamous), PS = 0, 1 Therapeutic Status: Preoperative or Nonsurgical Candidate (Clinical Staging) AJCC T Category: cT2a AJCC N Category: cN2 AJCC M Category: cM0 AJCC 8 Stage Grouping: IIIA ECOG Performance Status: 1 Intent of Therapy: Curative Intent, Discussed with Patient

## 2022-11-24 NOTE — Progress Notes (Signed)
ON PATHWAY REGIMEN - Non-Small Cell Lung  No Change  Continue With Treatment as Ordered.  Original Decision Date/Time: 08/01/2022 12:23     A cycle is every 7 days, concurrent with RT:     Paclitaxel      Carboplatin   **Always confirm dose/schedule in your pharmacy ordering system**  Patient Characteristics: Preoperative or Nonsurgical Candidate (Clinical Staging), Stage III - Nonsurgical Candidate (Nonsquamous and Squamous), PS = 0, 1 Therapeutic Status: Preoperative or Nonsurgical Candidate (Clinical Staging) AJCC T Category: cT2a AJCC N Category: cN2 AJCC M Category: cM0 AJCC 8 Stage Grouping: IIIA ECOG Performance Status: 1 Intent of Therapy: Curative Intent, Discussed with Patient

## 2022-11-24 NOTE — Progress Notes (Signed)
[facility]  [address] [place],  [place]  [address] [phone]  Clinic Day:  11/24/2022  Referring physician: [name], PA-C   HISTORY OF PRESENT ILLNESS:  The patient is a 72 y.o. 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  She comes in today to go over her CT scans ascertain her new disease baseline.  Since her last visit, the patient has been doing okay.  She still feels weak on a daily basis.  She also complains of intermittent nausea.    With respect to her lung cancer history, she initially underwent a right middle lobectomy in February 2022.  Due to the mediastinal nodal recurrence in May 2024, the patient underwent definitive chemoradiation with weekly carboplatin/paclitaxel, which was completed in late July 2024.    PHYSICAL EXAM:  Blood pressure 100/62, pulse (!) 107, temperature 97.7 F (36.5 C), resp. rate 16, height 5\' 7"  (1.702 m), weight 132 lb 3.2 oz (60 kg), SpO2 92%. Wt Readings from Last 3 Encounters:  11/24/22 132 lb 3.2 oz (60 kg)  11/17/22 131 lb 11.2 oz (59.7 kg)  11/16/22 130 lb 8 oz (59.2 kg)   Body mass index is 20.71 kg/m. Performance status (ECOG): 2 - Symptomatic, <50% confined to bed Physical Exam Constitutional:      Appearance: Normal appearance. She is not ill-appearing.     Comments: A chronically ill-appearing woman in no acute distress.  She is in a wheelchair.  HENT:     Mouth/Throat:     Mouth: Mucous membranes are moist.     Pharynx: Oropharynx is clear. No oropharyngeal exudate or posterior oropharyngeal erythema.  Cardiovascular:     Rate and Rhythm: Normal rate and regular rhythm.     Heart sounds: No murmur heard.    No friction rub. No gallop.  Pulmonary:     Effort: Pulmonary effort is normal. No respiratory distress.     Breath sounds: Decreased air movement present. Examination of the right-upper field reveals  decreased breath sounds. Examination of the left-upper field reveals decreased breath sounds. Decreased breath sounds present. No wheezing, rhonchi or rales.  Abdominal:     General: Bowel sounds are normal. There is no distension.     Palpations: Abdomen is soft. There is no mass.     Tenderness: There is no abdominal tenderness.  Musculoskeletal:        General: No swelling.     Right lower leg: No edema.     Left lower leg: No edema.  Lymphadenopathy:     Cervical: No cervical adenopathy.     Upper Body:     Right upper body: No supraclavicular or axillary adenopathy.     Left upper body: No supraclavicular or axillary adenopathy.     Lower Body: No right inguinal adenopathy. No left inguinal adenopathy.  Skin:    General: Skin is warm.     Coloration: Skin is not jaundiced.     Findings: No lesion or rash.  Neurological:     General: No focal deficit present.     Mental Status: She is alert and oriented to person, place, and time. Mental status is at baseline.  Psychiatric:        Mood and Affect: Mood normal.        Behavior: Behavior normal.        Thought Content: Thought content normal.   SCANS: Her chest  CT revealed the following: FINDINGS: Cardiovascular: Incidentally noted is central right main pulmonary embolism (series 2/image 87), subocclusive. Additional lobar and segmental pulmonary emboli in the bilateral upper and lower lobes. Clot burden is moderate to large.  Elevated RV to LV ratio (1.2), at least raising the possibility of right heart strain.  Heart is normal in size. Trace pericardial fluid.  No evidence of thoracic aneurysm. Atherosclerotic calcifications of the aortic arch.  Mild coronary atherosclerosis of the LAD.  Right chest port terminates cavoatrial junction.  Mediastinum/Nodes: Small mediastinal nodes, including a dominant 9 mm short axis subcarinal node (series 2/image 50), previously 12 mm. 12 mm short axis right hilar node (series  2/image 52), grossly unchanged.  Visualized thyroid is unremarkable.  Lungs/Pleura: Status post right middle lobectomy.  Mild biapical pleural-parenchymal scarring.  Moderate centrilobular and paraseptal emphysematous changes.  Superimposed paramediastinal radiation changes in the upper lobes (series 301/image 43), new.  No focal consolidation.  No suspicious pulmonary nodules.  No pleural effusion or pneumothorax.  Upper Abdomen: Visualized upper abdomen is notable for hepatic steatosis and vascular calcifications.  Musculoskeletal: Degenerative changes of the visualized thoracolumbar spine.  IMPRESSION: Bilateral pulmonary emboli, as above. Clot burden is moderate to large. Elevated RV to LV ratio, at least raising the possibility of right heart strain.  Status post right middle lobectomy. Improving mediastinal lymphadenopathy. New paramediastinal radiation changes.  Aortic Atherosclerosis (ICD10-I70.0) and Emphysema (ICD10-J43.9).  LABS:      Latest Ref Rng & Units 11/23/2022    1:27 PM 11/14/2022    1:20 PM 10/19/2022    4:26 AM  CBC  WBC  6.7     4.8  4.8   Hemoglobin 12.0 - 16.0 11.6     11.3  10.8   Hematocrit 36 - 46 35     36.0  33.3   Platelets 150 - 400 K/uL 290     292  169      This result is from an external source.      Latest Ref Rng & Units 11/23/2022    1:27 PM 11/14/2022    1:20 PM 10/19/2022    4:26 AM  CMP  Glucose 70 - 99 mg/dL  295  86   BUN 4 - 21 4     6   <5   Creatinine 0.5 - 1.1 0.5     0.50  0.82   Sodium 137 - 147 128     133  131   Potassium 3.5 - 5.1 mEq/L 3.1     2.6  3.8   Chloride 99 - 108 94     93  96   CO2 13 - 22 31     28  25    Calcium 8.7 - 10.7 7.8     7.8  8.1   Total Protein 6.5 - 8.1 g/dL  5.8    Total Bilirubin 0.3 - 1.2 mg/dL  0.7    Alkaline Phos 25 - 125 123     89    AST 13 - 35 35     20    ALT 7 - 35 U/L 15     13       This result is from an external source.   ASSESSMENT & PLAN:  A 72 y.o. female  with mediastinal nodal recurrence of her previous stage IB lung adenocarcinoma.  In clinic today, I went over all of her CT scan images with her, for which she could see that her mediastinal  lymphadenopathy has significantly improved after receiving her chemoradiation.  However, what was unexpectedly seen were bilateral pulmonary emboli.  In fact, she had prominent clot in her right main pulmonary artery.  Based upon this finding, I will place her on Eliquis, for which she will take 10 mg twice daily for 7 days.  She will then be switched to 5 mg twice daily, which she will be on for at least 1 total year.  I made sure the patient understood anybody with cancer has an exponentially increased risk of developing blood clots over time.  With respect to her disease management, I do believe she is well enough to begin maintenance immunotherapy with Durvalumab.  Her first cycle will commence on Thursday, October 10th.  With respect to her nausea, the patient was prescribed Phenergan today.  As she was also feeling dehydrated, she was also given 1 L of IV fluids today, as well as a dose of IV Zofran.  I stressed to the patient that she should do much better with immunotherapy every 4 weeks in comparison to the problems she had with concurrent chemoradiation.  On another note, this patient still has a fairly significant degree of deconditioning.  Based upon this, I will have home health, including its physical therapy staff, evaluate this patient.  Otherwise, I will see this patient back in early November 2024 before she heads into her second cycle of maintenance durvalumab immunotherapy.  The patient understands all the plans discussed today and is in agreement with them.  Shalayne Leach Kirby Funk, MD

## 2022-11-24 NOTE — Addendum Note (Signed)
Addended by: Domenic Schwab on: 11/24/2022 12:19 PM   Modules accepted: Orders

## 2022-11-24 NOTE — Progress Notes (Signed)
Pt to receive 1 liter NS, and potassium IV per Dr Melvyn Neth.

## 2022-11-24 NOTE — Addendum Note (Signed)
Addended by: Domenic Schwab on: 11/24/2022 12:26 PM   Modules accepted: Orders

## 2022-11-24 NOTE — Telephone Encounter (Signed)
Pt has called req IVF, & possible potassium?!?! She had labs done yesterday w/scans & was told her K+ is low. Na 128 K+ 3.1 Ca 7.8 . Above reported to Dr Melvyn Neth and Darl Pikes [facility].   Darl Pikes Phy,RPH: She needs NS + KCL but like you mentioned, [address] is full today. I do see a space from 12:15 to 2:00 that we could try to get 20 meq in.  Dr Melvyn Neth is agreeable to recommendations from [facility].

## 2022-11-25 NOTE — Progress Notes (Signed)
Agree with assessment/plan.  Raj Florestine Carmical, MD [facility] [phone]  

## 2022-11-27 ENCOUNTER — Telehealth: Payer: Self-pay

## 2022-11-27 ENCOUNTER — Encounter: Payer: Self-pay | Admitting: Oncology

## 2022-11-27 ENCOUNTER — Ambulatory Visit
Admission: RE | Admit: 2022-11-27 | Discharge: 2022-11-27 | Disposition: A | Discharge status: Home / Self Care | Payer: Medicare HMO | Source: Ambulatory Visit | Attending: Internal Medicine | Admitting: Internal Medicine

## 2022-11-27 NOTE — Progress Notes (Addendum)
Radiation Oncology         [phone] ________________________________  Name: [name] MRN: [id number]  Date of Service: 11/27/2022  DOB: 18-Mar-1950  Post Treatment Telephone Note  Diagnosis:  Stage IIIA, cT2aN2M0, NSCLC of the right hilar lung (as documented in provider EOT note)  The patient was not available for call today. Voicemail left.   The patient has scheduled follow up with her medical oncologist Dr. Arbutus Ped for ongoing care, and was encouraged to call if she develops concerns or questions regarding radiation.    Ruel Favors, LPN

## 2022-11-27 NOTE — Telephone Encounter (Signed)
Pt's daughter called to ask what else could her mom take for cough? She is taking tessalon pearles TID (she already had these I her home), without relief. Cough, mainly non productive, occasionally productive-but clear phlegm. Afebrile.

## 2022-11-28 ENCOUNTER — Encounter: Payer: Self-pay | Admitting: Oncology

## 2022-11-28 ENCOUNTER — Telehealth: Payer: Self-pay | Admitting: *Deleted

## 2022-11-28 NOTE — Progress Notes (Signed)
Care Coordination Note  11/28/2022 Name: [name] MRN: [id number] DOB: 10/03/1950  [name] is a 72 y.o. year old female who is a primary care patient of [name], Cordelia Poche and is actively engaged with the care management team. I reached out to Rhoderick Moody by phone today to assist with re-scheduling a follow up visit with the RN Case Manager  Follow up plan: Unsuccessful telephone outreach attempt made. A HIPAA compliant phone message was left for the patient providing contact information and requesting a return call.   Burman Nieves, CCMA Care Coordination Care Guide Direct Dial: [phone]

## 2022-11-29 ENCOUNTER — Inpatient Hospital Stay: Payer: Medicare HMO

## 2022-11-29 VITALS — BP 100/72 | HR 100 | Temp 98.1°F | Resp 18 | Ht 67.0 in | Wt 128.0 lb

## 2022-11-29 DIAGNOSIS — R110 Nausea: Secondary | ICD-10-CM | POA: Diagnosis not present

## 2022-11-29 DIAGNOSIS — C342 Malignant neoplasm of middle lobe, bronchus or lung: Secondary | ICD-10-CM | POA: Diagnosis not present

## 2022-11-29 DIAGNOSIS — I2699 Other pulmonary embolism without acute cor pulmonale: Secondary | ICD-10-CM | POA: Diagnosis not present

## 2022-11-29 DIAGNOSIS — Z79899 Other long term (current) drug therapy: Secondary | ICD-10-CM | POA: Diagnosis not present

## 2022-11-29 DIAGNOSIS — Z7901 Long term (current) use of anticoagulants: Secondary | ICD-10-CM | POA: Diagnosis not present

## 2022-11-29 DIAGNOSIS — C3491 Malignant neoplasm of unspecified part of right bronchus or lung: Secondary | ICD-10-CM

## 2022-11-29 DIAGNOSIS — Z902 Acquired absence of lung [part of]: Secondary | ICD-10-CM | POA: Diagnosis not present

## 2022-11-29 DIAGNOSIS — Z5112 Encounter for antineoplastic immunotherapy: Secondary | ICD-10-CM | POA: Diagnosis not present

## 2022-11-29 LAB — TSH: TSH: 2.906 u[IU]/mL (ref 0.350–4.500)

## 2022-11-29 MED ORDER — SODIUM CHLORIDE 0.9% FLUSH
10.0000 mL | INTRAVENOUS | Status: DC | PRN
  Administered 2022-11-29: 10 mL

## 2022-11-29 MED ORDER — SODIUM CHLORIDE 0.9 % IV SOLN
1500.0000 mg | Freq: Once | INTRAVENOUS | Status: AC
  Administered 2022-11-29: 1500 mg via INTRAVENOUS
  Filled 2022-11-29: qty 30

## 2022-11-29 MED ORDER — SODIUM CHLORIDE 0.9 % IV SOLN: Freq: Once | INTRAVENOUS | Status: AC

## 2022-11-29 MED ORDER — HEPARIN SOD (PORK) LOCK FLUSH 100 UNIT/ML IV SOLN
500.0000 [IU] | Freq: Once | INTRAVENOUS | Status: AC | PRN
  Administered 2022-11-29: 500 [IU]

## 2022-11-29 NOTE — Patient Instructions (Signed)
Durvalumab Injection What is this medication? DURVALUMAB (dur VAL ue mab) treats some types of cancer. It works by helping your immune system slow or stop the spread of cancer cells. It is a monoclonal antibody. This medicine may be used for other purposes; ask your health care provider or pharmacist if you have questions. COMMON BRAND NAME(S): IMFINZI What should I tell my care team before I take this medication? They need to know if you have any of these conditions: Allogeneic stem cell transplant (uses someone else's stem cells) Autoimmune diseases, such as Crohn disease, ulcerative colitis, lupus History of chest radiation Nervous system problems, such as Guillain-Barre syndrome, myasthenia gravis Organ transplant An unusual or allergic reaction to durvalumab, other medications, foods, dyes, or preservatives Pregnant or trying to get pregnant Breast-feeding How should I use this medication? This medication is infused into a vein. It is given by your care team in a hospital or clinic setting. A special MedGuide will be given to you before each treatment. Be sure to read this information carefully each time. Talk to your care team about the use of this medication in children. Special care may be needed. Overdosage: If you think you have taken too much of this medicine contact a poison control center or emergency room at once. NOTE: This medicine is only for you. Do not share this medicine with others. What if I miss a dose? Keep appointments for follow-up doses. It is important not to miss your dose. Call your care team if you are unable to keep an appointment. What may interact with this medication? Interactions have not been studied. This list may not describe all possible interactions. Give your health care provider a list of all the medicines, herbs, non-prescription drugs, or dietary supplements you use. Also tell them if you smoke, drink alcohol, or use illegal drugs. Some items may  interact with your medicine. What should I watch for while using this medication? Your condition will be monitored carefully while you are receiving this medication. You may need blood work while taking this medication. This medication may cause serious skin reactions. They can happen weeks to months after starting the medication. Contact your care team right away if you notice fevers or flu-like symptoms with a rash. The rash may be red or purple and then turn into blisters or peeling of the skin. You may also notice a red rash with swelling of the face, lips, or lymph nodes in your neck or under your arms. Tell your care team right away if you have any change in your eyesight. Talk to your care team if you may be pregnant. Serious birth defects can occur if you take this medication during pregnancy and for 3 months after the last dose. You will need a negative pregnancy test before starting this medication. Contraception is recommended while taking this medication and for 3 months after the last dose. Your care team can help you find the option that works for you. Do not breastfeed while taking this medication and for 3 months after the last dose. What side effects may I notice from receiving this medication? Side effects that you should report to your care team as soon as possible: Allergic reactions--skin rash, itching, hives, swelling of the face, lips, tongue, or throat Dry cough, shortness of breath or trouble breathing Eye pain, redness, irritation, or discharge with blurry or decreased vision Heart muscle inflammation--unusual weakness or fatigue, shortness of breath, chest pain, fast or irregular heartbeat, dizziness, swelling of the   ankles, feet, or hands Hormone gland problems--headache, sensitivity to light, unusual weakness or fatigue, dizziness, fast or irregular heartbeat, increased sensitivity to cold or heat, excessive sweating, constipation, hair loss, increased thirst or amount of  urine, tremors or shaking, irritability Infusion reactions--chest pain, shortness of breath or trouble breathing, feeling faint or lightheaded Kidney injury (glomerulonephritis)--decrease in the amount of urine, red or dark brown urine, foamy or bubbly urine, swelling of the ankles, hands, or feet Liver injury--right upper belly pain, loss of appetite, nausea, light-colored stool, dark yellow or brown urine, yellowing skin or eyes, unusual weakness or fatigue Pain, tingling, or numbness in the hands or feet, muscle weakness, change in vision, confusion or trouble speaking, loss of balance or coordination, trouble walking, seizures Rash, fever, and swollen lymph nodes Redness, blistering, peeling, or loosening of the skin, including inside the mouth Sudden or severe stomach pain, bloody diarrhea, fever, nausea, vomiting Side effects that usually do not require medical attention (report these to your care team if they continue or are bothersome): Bone, joint, or muscle pain Diarrhea Fatigue Loss of appetite Nausea Skin rash This list may not describe all possible side effects. Call your doctor for medical advice about side effects. You may report side effects to FDA at 1-800-FDA-1088. Where should I keep my medication? This medication is given in a hospital or clinic. It will not be stored at home. NOTE: This sheet is a summary. It may not cover all possible information. If you have questions about this medicine, talk to your doctor, pharmacist, or health care provider.  2024 Elsevier/Gold Standard (2021-06-21 00:00:00)  

## 2022-11-30 ENCOUNTER — Other Ambulatory Visit: Payer: Self-pay | Admitting: Physician Assistant

## 2022-11-30 ENCOUNTER — Telehealth: Payer: Self-pay

## 2022-11-30 MED ORDER — AZITHROMYCIN 250 MG PO TABS: ORAL_TABLET | ORAL | 0 refills | Status: DC

## 2022-11-30 NOTE — Telephone Encounter (Signed)
Patient called and stated that she needed to talk to provider would not share what the concern was about.   Per [name], PA-C: Patient had concern that she was exposed to whooping cough, and now has a cough. Patient is up to date with her tetanus vaccine, and provider will send in z-pack.

## 2022-12-01 ENCOUNTER — Telehealth: Payer: Self-pay

## 2022-12-01 LAB — T4: T4, Total: 8.6 ug/dL (ref 4.5–12.0)

## 2022-12-01 NOTE — Telephone Encounter (Signed)
Pt called to see how she is doing since Durvalumab infusion on 11/29/2022. Pt replied, "Good. I haven't had any problems. I was exposed to the whooping cough though. My little great granddaughter has it. I called [facility] and she gave me something to take". Pt denies mouth sores, SOB, chest pain, chills, fever, headache, facial flushing, wt gain and bone pain. She had 1 episode last night spitting up a little. She reports her cough is getting better. She had a little diarrhea, but didn't last long. She mentioned that she has 2 new brown spots- one on her chest, and one on her forehead. She already had 3 brown spots on chest. Pt reminded to call us if she were to develop a fever of 100.4 or higher, day or night. She verbalized understanding. I confirmed next appt, which is 12/28/2022 @ 1000.

## 2022-12-01 NOTE — Telephone Encounter (Addendum)
Pt has been exposed to whooping cough. She called [facility] to ask them to wait until next Wednesday to come and start care. The physical therapist needs a verbal order.

## 2022-12-04 ENCOUNTER — Telehealth: Payer: Self-pay

## 2022-12-04 NOTE — Telephone Encounter (Signed)
Pt's daughter called to request an appt for her mom. Pt is feeling stressed out from the coughing, although little better. She takes her last azithromycin today. Tessalon pearles didn't help, so she has been taking Mucinex. She has sensation of feeling choked with coughing @ times. "I think I might need some fluids". Afebrile. No chills. Has been in quarantine for 5 days, as was exposed to her granddaughter who had whooping cough. Pt received first Durvalumab on 11/29/2022. Please advise.

## 2022-12-04 NOTE — Progress Notes (Unsigned)
Per pt request call back in a week or so

## 2022-12-05 ENCOUNTER — Encounter: Payer: Self-pay | Admitting: Physician Assistant

## 2022-12-05 ENCOUNTER — Ambulatory Visit: Payer: Medicare HMO | Admitting: Physician Assistant

## 2022-12-05 VITALS — BP 98/60 | HR 103 | Temp 97.2°F | Ht 67.0 in | Wt 127.0 lb

## 2022-12-05 DIAGNOSIS — R053 Chronic cough: Secondary | ICD-10-CM | POA: Diagnosis not present

## 2022-12-05 MED ORDER — DEXTROMETHORPHAN-GUAIFENESIN 5-100 MG/5ML PO LIQD
10.0000 mL | Freq: Two times a day (BID) | ORAL | 0 refills | Status: DC
Start: 2022-12-05 — End: 2023-01-08

## 2022-12-05 NOTE — Assessment & Plan Note (Signed)
Continue taking Mucinex DM as directed Continue to monitor sputum color and for lingering signs of infection Use heat pack to help with intercostal pain for 20 minutes Will schedule follow up if pain continues or symptoms change

## 2022-12-05 NOTE — Progress Notes (Signed)
Acute Office Visit  Subjective:    Patient ID: [name], female    DOB: 07/16/1950, 72 y.o.   MRN: [id number]  Chief Complaint  Patient presents with   Cough    HPI: Patient present in office today for cough. Patient  granddaughter was diagnose with whooping cough on 11/29/2022, and patient was around the patient the day before. Patient called her called PCP [name] and made her aware of her cough and she was exposed to whooping cough and she sent her a z-pack and told her use to Mucinex for the cough and call office if she is not feeling any better. Patient stated she finished the z-pack yesterday and cough is a little better but has pain on her right side when she cough.   Denies coughing up any blood, but has been coughing up a light yellow but is getting lighter. Patient denies having anything like this before. States that the tightness has since gone away and that she did have on her back on her right side. Denies any worsening shortness of breath. Patient denies the pain and tightness ever on her left. Patient states that the tightness and pain started a few days ago. She admits that the Trelegy did help it.   Past Medical History:  Diagnosis Date   Abnormal chest x-ray 01/19/2020   Acute laryngopharyngitis 03/18/2020   Aortic atherosclerosis (HCC) 01/28/2020   Arthritis    Cardiac murmur 03/01/2020   COPD (chronic obstructive pulmonary disease) (HCC)    Coronary artery calcification seen on CT scan 03/01/2020   Cough 01/12/2020   COVID-19 03/10/2020   Dyspnea    on exertion,after getting covid   Family history of adverse reaction to anesthesia    brothr had n/v   GERD (gastroesophageal reflux disease)    Hiatal hernia    Hyperlipemia    Lung cancer, middle lobe (HCC)    middle lobe removed   Pneumonia    PONV (postoperative nausea and vomiting)    Pre-operative cardiovascular examination 03/01/2020   Right lower lobe lung mass 01/28/2020   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 Mild sigmoid diverticulosis   ESOPHAGOGASTRODUODENOSCOPY  05/15/2007   Hiatal hernia. Mild gastritis. Submucosal antral lesion (questionable importance)   ESOPHAGOGASTRODUODENOSCOPY (EGD) WITH PROPOFOL N/A 10/07/2022   Procedure: ESOPHAGOGASTRODUODENOSCOPY (EGD) WITH PROPOFOL;  Surgeon: Lynann Bologna, MD;  Location: [facility];  Service: Gastroenterology;  Laterality: N/A;   IR IMAGING GUIDED PORT INSERTION  10/10/2022   IR US GUIDE VASC ACCESS RIGHT  10/10/2022   IR VENIPUNCTURE 62YRS/OLDER BY MD  10/10/2022   LUNG REMOVAL, PARTIAL  03/29/2020   middle lobe removed   VIDEO BRONCHOSCOPY WITH ENDOBRONCHIAL ULTRASOUND N/A 07/20/2022   Procedure: VIDEO BRONCHOSCOPY WITH ENDOBRONCHIAL ULTRASOUND;  Surgeon: [name], MD;  Location: [facility];  Service: Thoracic;  Laterality: N/A;    Family History  Problem Relation Age of Onset   Heart disease Mother    Heart attack Mother    Stomach cancer Father    Cancer Father    Diabetes Father    Non-Hodgkin's lymphoma Brother    High Cholesterol Brother    High Cholesterol  Brother    Heart disease Brother    Heart attack Brother    Colon cancer Neg Hx    Esophageal cancer Neg Hx    Pancreatic cancer Neg Hx     Social History   Socioeconomic History   Marital status: Widowed    Spouse name: Not on file   Number of children: 1   Years of education: Not on file   Highest education level: GED or equivalent  Occupational History   Not on file  Tobacco Use   Smoking status: Former    Current packs/day: 0.00    Types: Cigarettes    Start date: 03/23/1967    Quit date:  03/22/2017    Years since quitting: 5.7   Smokeless tobacco: Never  Vaping Use   Vaping status: Never Used  Substance and Sexual Activity   Alcohol use: Never   Drug use: Never   Sexual activity: Not Currently  Other Topics Concern   Not on file  Social History Narrative   Not on file   Social Determinants of Health   Financial Resource Strain: Medium Risk (08/22/2022)   Overall Financial Resource Strain (CARDIA)    Difficulty of Paying Living Expenses: Somewhat hard  Food Insecurity: No Food Insecurity (10/20/2022)   Hunger Vital Sign    Worried About Running Out of Food in the Last Year: Never true    Ran Out of Food in the Last Year: Never true  Transportation Needs: No Transportation Needs (10/20/2022)   PRAPARE - Administrator, Civil Service (Medical): No    Lack of Transportation (Non-Medical): No  Physical Activity: Insufficiently Active (08/22/2022)   Exercise Vital Sign    Days of Exercise per Week: 4 days    Minutes of Exercise per Session: 10 min  Stress: No Stress Concern Present (08/22/2022)   [employer] of Occupational Health - Occupational Stress Questionnaire    Feeling of Stress : Not at all  Social Connections: Moderately Isolated (08/22/2022)   Social Connection and Isolation Panel [NHANES]    Frequency of Communication with Friends and Family: More than three times a week    Frequency of Social Gatherings with Friends and Family: More than three times a week    Attends Religious Services: 1 to 4 times per year    Active Member of [employer] or Organizations: No    Attends Banker Meetings: Never    Marital Status: Widowed  Intimate Partner Violence: Not At Risk (10/16/2022)   Humiliation, Afraid, Rape, and Kick questionnaire    Fear of Current or Ex-Partner: No    Emotionally Abused: No    Physically Abused: No    Sexually Abused: No    Outpatient Medications Prior to Visit  Medication Sig Dispense Refill   alum & mag  hydroxide-simeth (MAALOX MAX) 400-400-40 MG/5ML suspension Take 15 mLs by mouth every 6 (six) hours as needed for indigestion. 355 mL 1   APIXABAN (ELIQUIS) VTE STARTER PACK (10MG  AND 5MG ) Take as directed on package: start with two-5mg  tablets twice daily for 7 days. On day 8, switch to one-5mg  tablet twice daily. 74 each 0   dextromethorphan-guaiFENesin (MUCINEX DM) 30-600 MG 12hr tablet Take 1 tablet by mouth 2 (two) times daily.     ELIQUIS 5 MG TABS tablet Take by mouth.     Fluticasone-Umeclidin-Vilant (TRELEGY ELLIPTA) 100-62.5-25 MCG/ACT AEPB Inhale 1 puff into the lungs daily. (Patient taking differently: Inhale 1 puff into the lungs daily as needed (  For shortness of breath).) 2 each 0   lidocaine (XYLOCAINE) 2 % solution Use as directed 15 mLs in the mouth or throat every 6 (six) hours as needed for mouth pain. 100 mL 0   lidocaine-prilocaine (EMLA) cream Apply 1 Application topically as needed. Apply 1 hour prior to lab appt. Cover with plastic wrap, 30 g 0   mirtazapine (REMERON) 30 MG tablet Take 1 tablet (30 mg total) by mouth at bedtime. 30 tablet 2   montelukast (SINGULAIR) 10 MG tablet TAKE 1 TABLET AT BEDTIME 90 tablet 1   mupirocin ointment (BACTROBAN) 2 % Apply 1 application topically 2 (two) times daily. (Patient taking differently: Apply 1 application  topically 2 (two) times daily as needed (wound care).) 22 g 2   omeprazole (PRILOSEC) 20 MG capsule Take 20 mg by mouth 2 (two) times daily before a meal. Will start pantoprazole after pt finishes omeprazole.     ondansetron (ZOFRAN-ODT) 8 MG disintegrating tablet Take 1 tablet (8 mg total) by mouth every 8 (eight) hours as needed for nausea or vomiting. 30 tablet 2   polyethylene glycol (MIRALAX / GLYCOLAX) 17 g packet Take 17 g by mouth daily as needed for moderate constipation.     potassium chloride 20 MEQ/15ML (10%) SOLN Take 20 mEq by mouth daily.     prochlorperazine (COMPAZINE) 25 MG suppository Place 1 suppository (25 mg  total) rectally every 12 (twelve) hours. 30 suppository 1   promethazine (PHENERGAN) 25 MG tablet Take 1 tablet (25 mg total) by mouth every 6 (six) hours as needed for nausea. 30 tablet 3   rosuvastatin (CRESTOR) 5 MG tablet TAKE 1 TABLET EVERY DAY 90 tablet 1   sucralfate (CARAFATE) 1 GM/10ML suspension Take 1 g by mouth 4 (four) times daily -  with meals and at bedtime.     pantoprazole (PROTONIX) 40 MG tablet Take 1 tablet (40 mg total) by mouth 2 (two) times daily. 60 tablet 0   azithromycin (ZITHROMAX) 250 MG tablet Take 2 tablets on day 1, then 1 tablet daily on days 2 through 5 6 tablet 0   No facility-administered medications prior to visit.    Allergies  Allergen Reactions   Codeine Hives   Surgical Lubricant Hives    Review of Systems  Constitutional:  Negative for chills, fatigue and fever.  HENT:  Negative for congestion, ear pain, postnasal drip, rhinorrhea, sinus pressure, sinus pain and sore throat.   Respiratory:  Positive for cough and shortness of breath.   Cardiovascular:  Negative for chest pain.  Gastrointestinal:  Negative for diarrhea and nausea.  Neurological:  Negative for dizziness and headaches.       Objective:        12/05/2022    3:36 PM 11/29/2022    2:15 PM 11/24/2022    4:15 PM  Vitals with BMI  Height 5\' 7"  5\' 7"  5\' 7"   Weight 127 lbs 128 lbs 1 oz 132 lbs 3 oz  BMI 19.89 20.05 20.7  Systolic 98 100 100  Diastolic 60 72 62  Pulse 103 100 107    Orthostatic VS for the past 72 hrs (Last 3 readings):  Patient Position BP Location Cuff Size  12/05/22 1536 Sitting Left Arm Small     Physical Exam Vitals reviewed.  Constitutional:      Appearance: Normal appearance.  Cardiovascular:     Rate and Rhythm: Normal rate and regular rhythm.     Heart sounds: Normal heart sounds.  Pulmonary:  Effort: Pulmonary effort is normal.     Breath sounds: Normal breath sounds.  Abdominal:     General: Bowel sounds are normal.     Palpations:  Abdomen is soft.     Tenderness: There is no abdominal tenderness.  Musculoskeletal:       Arms:  Neurological:     Mental Status: She is alert and oriented to person, place, and time.  Psychiatric:        Mood and Affect: Mood normal.        Behavior: Behavior normal.     Health Maintenance Due  Topic Date Due   Medicare Annual Wellness (AWV)  08/19/2022   INFLUENZA VACCINE  09/21/2022    There are no preventive care reminders to display for this patient.   Lab Results  Component Value Date   TSH 2.906 11/29/2022   Lab Results  Component Value Date   WBC 6.7 11/23/2022   HGB 11.6 (A) 11/23/2022   HCT 35 (A) 11/23/2022   MCV 94.0 11/14/2022   PLT 290 11/23/2022   Lab Results  Component Value Date   NA 128 (A) 11/23/2022   K 3.1 (A) 11/23/2022   CO2 31 (A) 11/23/2022   GLUCOSE 135 (H) 11/14/2022   BUN 4 11/23/2022   CREATININE 0.5 11/23/2022   BILITOT 0.7 11/14/2022   ALKPHOS 123 11/23/2022   AST 35 11/23/2022   ALT 15 11/23/2022   PROT 5.8 (L) 11/14/2022   ALBUMIN 2.7 (A) 11/23/2022   CALCIUM 7.8 (A) 11/23/2022   ANIONGAP 12 11/14/2022   EGFR 75 08/22/2022   Lab Results  Component Value Date   CHOL 219 (H) 08/22/2022   Lab Results  Component Value Date   HDL 46 08/22/2022   Lab Results  Component Value Date   LDLCALC 146 (H) 08/22/2022   Lab Results  Component Value Date   TRIG 147 08/22/2022   Lab Results  Component Value Date   CHOLHDL 4.8 (H) 08/22/2022   No results found for: "HGBA1C" Total time spent on today's visit was greater than 20 minutes, including both face-to-face time and nonface-to-face time personally spent on review of chart (labs and imaging), discussing labs and goals, discussing further work-up, treatment options, referrals to specialist if needed, reviewing outside records of pertinent, answering patient's questions, and coordinating care.     Assessment & Plan:  Chronic cough Assessment & Plan: Continue taking  Mucinex DM as directed Continue to monitor sputum color and for lingering signs of infection Use heat pack to help with intercostal pain for 20 minutes Will schedule follow up if pain continues or symptoms change  Orders: -     Dextromethorphan-guaiFENesin; Take 10 mLs by mouth 2 (two) times daily.  Dispense: 355 mL; Refill: 0     Meds ordered this encounter  Medications   Dextromethorphan-guaiFENesin 5-100 MG/5ML LIQD    Sig: Take 10 mLs by mouth 2 (two) times daily.    Dispense:  355 mL    Refill:  0    No orders of the defined types were placed in this encounter.    Follow-up: Return if symptoms worsen or fail to improve.  An After Visit Summary was printed and given to the patient.  Langley Gauss, [facility] [phone]

## 2022-12-06 DIAGNOSIS — C3491 Malignant neoplasm of unspecified part of right bronchus or lung: Secondary | ICD-10-CM | POA: Diagnosis not present

## 2022-12-06 DIAGNOSIS — I700 Atherosclerosis of aorta: Secondary | ICD-10-CM | POA: Diagnosis not present

## 2022-12-06 DIAGNOSIS — I2694 Multiple subsegmental pulmonary emboli without acute cor pulmonale: Secondary | ICD-10-CM | POA: Diagnosis not present

## 2022-12-06 DIAGNOSIS — K760 Fatty (change of) liver, not elsewhere classified: Secondary | ICD-10-CM | POA: Diagnosis not present

## 2022-12-06 DIAGNOSIS — M47816 Spondylosis without myelopathy or radiculopathy, lumbar region: Secondary | ICD-10-CM | POA: Diagnosis not present

## 2022-12-06 DIAGNOSIS — J439 Emphysema, unspecified: Secondary | ICD-10-CM | POA: Diagnosis not present

## 2022-12-06 DIAGNOSIS — Z7951 Long term (current) use of inhaled steroids: Secondary | ICD-10-CM | POA: Diagnosis not present

## 2022-12-06 DIAGNOSIS — Z7901 Long term (current) use of anticoagulants: Secondary | ICD-10-CM | POA: Diagnosis not present

## 2022-12-06 DIAGNOSIS — I2510 Atherosclerotic heart disease of native coronary artery without angina pectoris: Secondary | ICD-10-CM | POA: Diagnosis not present

## 2022-12-07 ENCOUNTER — Encounter: Payer: Self-pay | Admitting: Oncology

## 2022-12-07 NOTE — Progress Notes (Signed)
Care Coordination Note  12/07/2022 Name: [name] MRN: [id number] DOB: 1951/02/14  [name] is a 72 y.o. year old female who is a primary care patient of [name], Cordelia Poche and is actively engaged with the care management team. I reached out to Rhoderick Moody by phone today to assist with re-scheduling a follow up visit with the RN Case Manager  Follow up plan: Patient declines further follow up and engagement by the care management team. Appropriate care team members and provider have been notified via electronic communication.   Burman Nieves, CCMA Care Coordination Care Guide Direct Dial: [phone]

## 2022-12-11 ENCOUNTER — Ambulatory Visit (INDEPENDENT_AMBULATORY_CARE_PROVIDER_SITE_OTHER): Payer: Medicare HMO | Admitting: Physician Assistant

## 2022-12-11 ENCOUNTER — Encounter: Payer: Self-pay | Admitting: Physician Assistant

## 2022-12-11 VITALS — BP 110/62 | HR 96 | Temp 97.7°F | Resp 15 | Ht 67.0 in | Wt 125.6 lb

## 2022-12-11 DIAGNOSIS — K760 Fatty (change of) liver, not elsewhere classified: Secondary | ICD-10-CM | POA: Diagnosis not present

## 2022-12-11 DIAGNOSIS — J439 Emphysema, unspecified: Secondary | ICD-10-CM | POA: Diagnosis not present

## 2022-12-11 DIAGNOSIS — C3491 Malignant neoplasm of unspecified part of right bronchus or lung: Secondary | ICD-10-CM

## 2022-12-11 DIAGNOSIS — M47816 Spondylosis without myelopathy or radiculopathy, lumbar region: Secondary | ICD-10-CM | POA: Diagnosis not present

## 2022-12-11 DIAGNOSIS — Z7951 Long term (current) use of inhaled steroids: Secondary | ICD-10-CM | POA: Diagnosis not present

## 2022-12-11 DIAGNOSIS — Z23 Encounter for immunization: Secondary | ICD-10-CM | POA: Diagnosis not present

## 2022-12-11 DIAGNOSIS — K2960 Other gastritis without bleeding: Secondary | ICD-10-CM | POA: Diagnosis not present

## 2022-12-11 DIAGNOSIS — I2694 Multiple subsegmental pulmonary emboli without acute cor pulmonale: Secondary | ICD-10-CM | POA: Diagnosis not present

## 2022-12-11 DIAGNOSIS — I2510 Atherosclerotic heart disease of native coronary artery without angina pectoris: Secondary | ICD-10-CM | POA: Diagnosis not present

## 2022-12-11 DIAGNOSIS — Z7901 Long term (current) use of anticoagulants: Secondary | ICD-10-CM | POA: Diagnosis not present

## 2022-12-11 DIAGNOSIS — I700 Atherosclerosis of aorta: Secondary | ICD-10-CM | POA: Diagnosis not present

## 2022-12-11 MED ORDER — PANTOPRAZOLE SODIUM 40 MG PO TBEC
40.0000 mg | DELAYED_RELEASE_TABLET | Freq: Every day | ORAL | 1 refills | Status: DC
Start: 2022-12-11 — End: 2023-01-08

## 2022-12-11 NOTE — Progress Notes (Signed)
Subjective:  Patient ID: [name], female    DOB: 10/21/1950  Age: 72 y.o. MRN: [id number]  Chief Complaint  Patient presents with   Medical Management of Chronic Issues    HPI Pt with  nonsmall cell lung cancer, adenocarcinoma resulting with right middle lobectomy on 03/29/20 - she did have disease recurrence 5/24 in mediastinal lymph nodes She had been undergoing chemo and radiation with Dr Shirline Frees and has completed that and now has been referred to Dr Melvyn Neth at pt request - next appt on 12/28/22 She had endoscopy by Dr Chales Abrahams 10/07/22 which showed radiation induced esophagitis and has a follow up on 12/25/22 - She has continued to lose weight but states her eating and drinking are better but then says she has not been doing her protein drinks or ensure - highly recommend to restart  Pt has had some reflux symptoms and was supposed to have started protonix but never did - she has omeprazole 20mg  at home - recommend to increase omeprazole 20mg  to bid but will send in rx for protonix and to switch over to that when it comes in  Pt requests flu shot    08/22/2022   11:04 AM 08/01/2022    1:07 PM 04/12/2022    3:48 PM 12/08/2021    8:31 AM 08/18/2021   11:33 AM  Depression screen PHQ 2/9  Decreased Interest 0 0 0 0 0  Down, Depressed, Hopeless 0 0 0 0 0  PHQ - 2 Score 0 0 0 0 0  Altered sleeping 0   0   Tired, decreased energy 3   0   Change in appetite 3   0   Feeling bad or failure about yourself  0   0   Trouble concentrating 0   0   Moving slowly or fidgety/restless 0   0   Suicidal thoughts 0   0   PHQ-9 Score 6   0   Difficult doing work/chores Not difficult at all   Not difficult at all         06/09/2020    9:59 AM 12/10/2020    8:18 AM 08/18/2021   11:53 AM 04/12/2022    3:48 PM 08/22/2022   11:04 AM  Fall Risk  Falls in the past year? 0 0 0 0 0  Was there an injury with Fall? 0 0 0 0 0  Fall Risk Category Calculator 0 0 0 0 0  Fall Risk Category (Retired) Low Low Low     (RETIRED) Patient Fall Risk Level Low fall risk Low fall risk Low fall risk    Patient at Risk for Falls Due to No Fall Risks No Fall Risks No Fall Risks No Fall Risks No Fall Risks  Fall risk Follow up Falls evaluation completed Falls evaluation completed Falls evaluation completed Falls evaluation completed Falls evaluation completed    CONSTITUTIONAL: see HPI  E/N/T: Negative for ear pain, nasal congestion and sore throat.  CARDIOVASCULAR: Negative for chest pain, dizziness,  RESPIRATORY: Negative for recent cough and dyspnea.  GASTROINTESTINAL:see HPI       Current Outpatient Medications:    alum & mag hydroxide-simeth (MAALOX MAX) 400-400-40 MG/5ML suspension, Take 15 mLs by mouth every 6 (six) hours as needed for indigestion., Disp: 355 mL, Rfl: 1   APIXABAN (ELIQUIS) VTE STARTER PACK (10MG  AND 5MG ), Take as directed on package: start with two-5mg  tablets twice daily for 7 days. On day 8, switch to one-5mg  tablet twice daily., Disp:  74 each, Rfl: 0   dextromethorphan-guaiFENesin (MUCINEX DM) 30-600 MG 12hr tablet, Take 1 tablet by mouth 2 (two) times daily., Disp: , Rfl:    Dextromethorphan-guaiFENesin 5-100 MG/5ML LIQD, Take 10 mLs by mouth 2 (two) times daily., Disp: 355 mL, Rfl: 0   ELIQUIS 5 MG TABS tablet, Take by mouth., Disp: , Rfl:    Fluticasone-Umeclidin-Vilant (TRELEGY ELLIPTA) 100-62.5-25 MCG/ACT AEPB, Inhale 1 puff into the lungs daily. (Patient taking differently: Inhale 1 puff into the lungs daily as needed (For shortness of breath).), Disp: 2 each, Rfl: 0   lidocaine (XYLOCAINE) 2 % solution, Use as directed 15 mLs in the mouth or throat every 6 (six) hours as needed for mouth pain., Disp: 100 mL, Rfl: 0   lidocaine-prilocaine (EMLA) cream, Apply 1 Application topically as needed. Apply 1 hour prior to lab appt. Cover with plastic wrap,, Disp: 30 g, Rfl: 0   mirtazapine (REMERON) 30 MG tablet, Take 1 tablet (30 mg total) by mouth at bedtime., Disp: 30 tablet, Rfl: 2    montelukast (SINGULAIR) 10 MG tablet, TAKE 1 TABLET AT BEDTIME, Disp: 90 tablet, Rfl: 1   mupirocin ointment (BACTROBAN) 2 %, Apply 1 application topically 2 (two) times daily. (Patient taking differently: Apply 1 application  topically 2 (two) times daily as needed (wound care).), Disp: 22 g, Rfl: 2   ondansetron (ZOFRAN-ODT) 8 MG disintegrating tablet, Take 1 tablet (8 mg total) by mouth every 8 (eight) hours as needed for nausea or vomiting., Disp: 30 tablet, Rfl: 2   pantoprazole (PROTONIX) 40 MG tablet, Take 1 tablet (40 mg total) by mouth daily., Disp: 90 tablet, Rfl: 1   polyethylene glycol (MIRALAX / GLYCOLAX) 17 g packet, Take 17 g by mouth daily as needed for moderate constipation., Disp: , Rfl:    potassium chloride 20 MEQ/15ML (10%) SOLN, Take 20 mEq by mouth daily., Disp: , Rfl:    prochlorperazine (COMPAZINE) 25 MG suppository, Place 1 suppository (25 mg total) rectally every 12 (twelve) hours., Disp: 30 suppository, Rfl: 1   promethazine (PHENERGAN) 25 MG tablet, Take 1 tablet (25 mg total) by mouth every 6 (six) hours as needed for nausea., Disp: 30 tablet, Rfl: 3   rosuvastatin (CRESTOR) 5 MG tablet, TAKE 1 TABLET EVERY DAY, Disp: 90 tablet, Rfl: 1  Past Medical History:  Diagnosis Date   Abnormal chest x-ray 01/19/2020   Acute laryngopharyngitis 03/18/2020   Aortic atherosclerosis (HCC) 01/28/2020   Arthritis    Cardiac murmur 03/01/2020   COPD (chronic obstructive pulmonary disease) (HCC)    Coronary artery calcification seen on CT scan 03/01/2020   Cough 01/12/2020   COVID-19 03/10/2020   Dyspnea    on exertion,after getting covid   Family history of adverse reaction to anesthesia    brothr had n/v   GERD (gastroesophageal reflux disease)    Hiatal hernia    Hyperlipemia    Lung cancer, middle lobe (HCC)    middle lobe removed   Pneumonia    PONV (postoperative nausea and vomiting)    Pre-operative cardiovascular examination 03/01/2020   Right lower lobe lung mass  01/28/2020   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   Visit for screening mammogram 01/28/2020   Objective:  PHYSICAL EXAM:   VS: BP 110/62 (BP Location: Left Arm, Patient Position: Sitting, Cuff Size: Small)   Pulse 96   Temp 97.7 F (36.5 C) (Temporal)   Resp 15   Ht 5\' 7"  (1.702 m)   Wt 125 lb 9.6  oz (57 kg)   SpO2 97%   BMI 19.67 kg/m   GEN:  in no acute distress - thin, frail  Cardiac: RRR; no murmurs, rubs, or gallops,no edema - Respiratory:  normal respiratory rate and pattern with no distress - normal breath sounds with no rales, rhonchi, wheezes or rubs  Skin: warm and dry, no rash   Psych: euthymic mood, appropriate affect and demeanor   Assessment & Plan:    Primary lung cancer, right (HCC) Continue follow up with oncology  Radiation gastritis Follow up with GI as scheduled Rx for protonix 40mg  qd Recommend protein shakes/ensure  Need for flu shot Fluad given    Follow-up: Return in about 4 weeks (around 01/08/2023) for follow-up.  An After Visit Summary was printed and given to the patient.  Jettie Pagan [facility] [phone]

## 2022-12-13 ENCOUNTER — Other Ambulatory Visit: Payer: Self-pay | Admitting: Oncology

## 2022-12-13 ENCOUNTER — Telehealth: Payer: Self-pay | Admitting: Nurse Practitioner

## 2022-12-13 NOTE — Telephone Encounter (Signed)
Inbound call from patient stating that she is scheduled for an appointment with Dr. Chales Abrahams on 11/4. Patient stated that se is choking and has been getting sick. Patient is wanting to see if she can get in sooner than 11/4 and have her throat stretched. Please advise.

## 2022-12-14 NOTE — Telephone Encounter (Signed)
Spoke with patient & she has had several radiation treatments, and it has impacted her swallowing. She is still able to drink/eat okay, it just has become more difficult and chokes at times. Moved her to sooner appointment date 10/30 with Dr. Chales Abrahams & discussed ED precautions with her in the meantime. Pt verbalized all understanding.

## 2022-12-15 DIAGNOSIS — I700 Atherosclerosis of aorta: Secondary | ICD-10-CM | POA: Diagnosis not present

## 2022-12-15 DIAGNOSIS — Z7901 Long term (current) use of anticoagulants: Secondary | ICD-10-CM | POA: Diagnosis not present

## 2022-12-15 DIAGNOSIS — J439 Emphysema, unspecified: Secondary | ICD-10-CM | POA: Diagnosis not present

## 2022-12-15 DIAGNOSIS — M47816 Spondylosis without myelopathy or radiculopathy, lumbar region: Secondary | ICD-10-CM | POA: Diagnosis not present

## 2022-12-15 DIAGNOSIS — C3491 Malignant neoplasm of unspecified part of right bronchus or lung: Secondary | ICD-10-CM | POA: Diagnosis not present

## 2022-12-15 DIAGNOSIS — I2510 Atherosclerotic heart disease of native coronary artery without angina pectoris: Secondary | ICD-10-CM | POA: Diagnosis not present

## 2022-12-15 DIAGNOSIS — I2694 Multiple subsegmental pulmonary emboli without acute cor pulmonale: Secondary | ICD-10-CM | POA: Diagnosis not present

## 2022-12-15 DIAGNOSIS — Z7951 Long term (current) use of inhaled steroids: Secondary | ICD-10-CM | POA: Diagnosis not present

## 2022-12-15 DIAGNOSIS — K760 Fatty (change of) liver, not elsewhere classified: Secondary | ICD-10-CM | POA: Diagnosis not present

## 2022-12-18 DIAGNOSIS — Z4689 Encounter for fitting and adjustment of other specified devices: Secondary | ICD-10-CM | POA: Diagnosis not present

## 2022-12-20 ENCOUNTER — Encounter: Payer: Self-pay | Admitting: Gastroenterology

## 2022-12-20 ENCOUNTER — Ambulatory Visit (INDEPENDENT_AMBULATORY_CARE_PROVIDER_SITE_OTHER): Payer: Medicare HMO | Admitting: Gastroenterology

## 2022-12-20 VITALS — BP 118/68 | HR 96 | Ht 67.0 in | Wt 127.0 lb

## 2022-12-20 DIAGNOSIS — K219 Gastro-esophageal reflux disease without esophagitis: Secondary | ICD-10-CM

## 2022-12-20 DIAGNOSIS — R112 Nausea with vomiting, unspecified: Secondary | ICD-10-CM

## 2022-12-20 DIAGNOSIS — R1319 Other dysphagia: Secondary | ICD-10-CM

## 2022-12-20 MED ORDER — PANTOPRAZOLE SODIUM 40 MG PO TBEC: 40.0000 mg | DELAYED_RELEASE_TABLET | Freq: Two times a day (BID) | ORAL | 3 refills | Status: DC

## 2022-12-20 NOTE — Progress Notes (Signed)
11/16/2022 [name] [id number] 05-09-1950     Chief Complaint: Difficulty swallowing    History of Present Illness: [name] is a 72 year old female with a past medical history of arthritis, hyperlipidemia, coronary calcifications per CT, GERD, colon polyps, COPD, nonsmall cell lung cancer, adenocarcinoma s/p right middle lobectomy 03/29/20 with recurrence 5/24 in mediastinal lymph nodes treated with chemo and radiation. She is known by Dr. Chales Abrahams. 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 She is accompanied by her son.    She underwent an EGD 10/07/2022 which showed moderate proximal/mid esophagitis without bleeding, likely from radiation. Esophageal biopsies showed severe acute nonspecific esophagitis with ulceration. Following her EGD, she could not swallow her oral medications including Omeprazole and her oral food intake remained limited, tolerated small portions of soups, grits and gravy. She stated everything tastes bad, metallic taste. Over the past few weeks, she is having less dysphagia and odynophagia and is tolerating soft chicken, pasta and creamed potatoes. She is drinking a few ounces of Fair Life protein supplement. She did not take Pantoprazole 40mg  po bid post EGD as prescribed as she preferred to take Omeprazole 20mg  po bid. She took viscous Lidocaine for a few weeks. She remains on Sucralfate 1gm slurry tid 30 minutes before meal. She uses Phenergan po or PR for nausea and she sometimes uses Zofran which she stated recently works better than when used in the past. She endorses losing 30lbs within the past 6 months.        Latest Ref Rng & Units 11/14/2022    1:20 PM 10/19/2022    4:26 AM 10/18/2022    4:40 AM  CBC  WBC 4.0 - 10.5 K/uL 4.8  4.8  4.1   Hemoglobin 12.0 - 15.0 g/dL 24.4  01.0  27.2   Hematocrit 36.0 - 46.0 % 36.0  33.3  30.9   Platelets 150 - 400 K/uL 292  169  124           Latest  Ref Rng & Units 11/14/2022    1:20 PM 10/19/2022    4:26 AM 10/18/2022    4:40 AM  CMP  Glucose 70 - 99 mg/dL 536  86  94   BUN 8 - 23 mg/dL 6  <5  <5   Creatinine 0.44 - 1.00 mg/dL 6.44  0.34  7.42   Sodium 135 - 145 mmol/L 133  131  130   Potassium 3.5 - 5.1 mmol/L 2.6  3.8  3.4   Chloride 98 - 111 mmol/L 93  96  97   CO2 22 - 32 mmol/L 28  25  24    Calcium 8.9 - 10.3 mg/dL 7.8  8.1  7.7   Total Protein 6.5 - 8.1 g/dL 5.8       Total Bilirubin 0.3 - 1.2 mg/dL 0.7       Alkaline Phos 38 - 126 U/L 89       AST 15 - 41 U/L 20       ALT 0 - 44 U/L 13         Chest CT with contrast 10/18/2022: Status post right middle lobectomy.   Small mediastinal and right hilar nodal metastases, grossly unchanged.   No evidence of new/progressive metastatic disease.   Aortic Atherosclerosis (ICD10-I70.0) and Emphysema (ICD10-J43.9).   CTAP without contrast 10/16/2022: FINDINGS: Lower chest: No acute findings.  Coronary artery calcifications.   Hepatobiliary: No focal liver abnormality  is seen. No gallstones, gallbladder wall thickening, or biliary dilatation.   Pancreas: Unremarkable. No pancreatic ductal dilatation or surrounding inflammatory changes.   Spleen: Normal in size without focal abnormality.   Adrenals/Urinary Tract: Adrenal glands are unremarkable. Kidneys are normal, without renal calculi, focal lesion, or hydronephrosis. Bladder is unremarkable.   Stomach/Bowel: Stomach is within normal limits. Appendix appears normal. No evidence of bowel wall thickening, distention, or inflammatory changes.   Vascular/Lymphatic: Aortic atherosclerosis. No enlarged abdominal or pelvic lymph nodes.   Reproductive: Uterus and bilateral adnexa are unremarkable. Vaginal pessary in place.   Other: No abdominal wall hernia or abnormality. No abdominopelvic ascites.   Musculoskeletal: No acute or significant osseous findings.   IMPRESSION: 1. No acute abdominopelvic findings. 2.  Coronary artery calcifications.  Aortic Atherosclerosis (ICD10-I70.0).     GI PROCEDURES:   EGD 10/07/2022: - Mod proximal/mid esophagitis with no bleeding (likely radiation induced). Biopsied. - Z-line regular, 35 cm from the incisors. - 3 cm hiatal hernia. - Multiple gastric polyps. Biopsied. - Normal examined duodenum. Biopsied   A. DUODENAL BIOPSY:  - Duodenal mucosa with no specific histopathologic changes  - Negative for increased intraepithelial lymphocytes or villous  architectural changes   B. GASTRIC BIOPSY:  - Gastric antral and oxyntic mucosa with no specific histopathologic  changes  - Helicobacter pylori-like organisms are not identified on routine HE  stain   C. GASTRIC POLYP, BIOPSY:  - Fundic gland polyp(s)  - Negative for dysplasia   D. ESOPHAGEAL BIOPSY:  - Severe acute nonspecific esophagitis with ulceration, see comment    Colonoscopy 01/18/2017: 6mm sessile polyp removed from the proximal ascending colon Two 6 and 8 mm polyps removed from the distal ascending colon near the hepatic flexure.     Medications Ordered Prior to Encounter        Current Outpatient Medications on File Prior to Visit  Medication Sig Dispense Refill   alum & mag hydroxide-simeth (MAALOX MAX) 400-400-40 MG/5ML suspension Take 15 mLs by mouth every 6 (six) hours as needed for indigestion. 355 mL 1   Fluticasone-Umeclidin-Vilant (TRELEGY ELLIPTA) 100-62.5-25 MCG/ACT AEPB Inhale 1 puff into the lungs daily. (Patient taking differently: Inhale 1 puff into the lungs daily as needed (For shortness of breath).) 2 each 0   lidocaine (XYLOCAINE) 2 % solution Use as directed 15 mLs in the mouth or throat every 6 (six) hours as needed for mouth pain. 100 mL 0   lidocaine-prilocaine (EMLA) cream Apply 1 Application topically as needed. Apply 1 hour prior to lab appt. Cover with plastic wrap, 30 g 0   mirtazapine (REMERON) 30 MG tablet Take 1 tablet (30 mg total) by mouth at bedtime. 30 tablet  2   mupirocin ointment (BACTROBAN) 2 % Apply 1 application topically 2 (two) times daily. (Patient taking differently: Apply 1 application  topically 2 (two) times daily as needed (wound care).) 22 g 2   omeprazole (PRILOSEC) 20 MG capsule Take 20 mg by mouth 2 (two) times daily before a meal. Will start pantoprazole after pt finishes omeprazole.       ondansetron (ZOFRAN-ODT) 8 MG disintegrating tablet Take 1 tablet (8 mg total) by mouth every 8 (eight) hours as needed for nausea or vomiting. 30 tablet 2   polyethylene glycol (MIRALAX / GLYCOLAX) 17 g packet Take 17 g by mouth daily as needed for moderate constipation.       prochlorperazine (COMPAZINE) 25 MG suppository Place 1 suppository (25 mg total) rectally every 12 (twelve) hours. 30  suppository 1   sucralfate (CARAFATE) 1 GM/10ML suspension Take 1 g by mouth 4 (four) times daily -  with meals and at bedtime.       pantoprazole (PROTONIX) 40 MG tablet Take 1 tablet (40 mg total) by mouth 2 (two) times daily. 60 tablet 0    No current facility-administered medications on file prior to visit.      Allergies      Allergies  Allergen Reactions   Codeine Hives   Surgical Lubricant Hives      Current Medications, Allergies, Past Medical History, Past Surgical History, Family History and Social History were reviewed in [employer] record.   Review of Systems:   Constitutional: Negative for fever, sweats, chills or weight loss.  Respiratory: Negative for shortness of breath.   Cardiovascular: Negative for chest pain, palpitations and leg swelling.  Gastrointestinal: See HPI.  Musculoskeletal: + Back pain. Neurological: Negative for dizziness, headaches or paresthesias.    Physical Exam: BP 124/84   Pulse (!) 120   Ht 5\' 7"  (1.702 m)   Wt 130 lb 8 oz (59.2 kg)   SpO2 (!) 88%   BMI 20.44 kg/m       Wt Readings from Last 3 Encounters:  11/16/22 130 lb 8 oz (59.2 kg)  11/14/22 131 lb 4.8 oz (59.6 kg)   11/13/22 130 lb 3.2 oz (59.1 kg)    General: Chronically ill appearing 72 year old female presents in a wheelchair in no acute distress. Head: Normocephalic and atraumatic. Eyes: No scleral icterus. Conjunctiva pink . Ears: Normal auditory acuity. Mouth: Dentition intact. No ulcers or lesions.  Lungs: Clear throughout to auscultation. Heart: Regular rate and rhythm, no murmur. Abdomen: Soft, nontender and nondistended. No masses or hepatomegaly. Normal bowel sounds x 4 quadrants.  Rectal: Deferred.  Musculoskeletal: Symmetrical with no gross deformities. Extremities: No edema. Neurological: Alert oriented x 4. No focal deficits.  Psychological: Alert and cooperative. Normal mood and affect   Assessment and Recommendations:   72 year old female with nonsmall cell lung cancer, adenocarcinoma s/p right middle lobectomy 03/29/20 with recurrence 5/24 in mediastinal lymph nodes treated with chemo and radiation with dysphagia and odynophagia secondary to radiation esophagitis.  EGD 10/07/2022 showed moderate proximal/mid esophagitis without bleeding, likely from radiation. Esophageal biopsies showed severe acute nonspecific esophagitis with ulceration. She was unable to swallow any medication including PPI x 3 to 4 weeks post EGD. Her esophagitis symptoms have improved over the past few weeks and she is consistently taking Omeprazole 20mg  po  bid, Carafate slurry 1 gm po tid. She is tolerate small quantities of soft foods.  -Increase Omeprazole 20mg  to two capsule (40mg ) po bid to be taken 30 minutes before breakfast and dinner. Note, patient preferred to take Omeprazole instead of Protonix. -Continue Carafate 1 gm po tid -Soft diet as tolerated, Fair Life protein drink 8 to 16 ounces daily  -Follow up with Dr. Chales Abrahams in 4 weeks, to verify if repeat EGD required to assess for esophagitis healing    Weight loss secondary to lung cancer and radiation esophagitis  -Continue to monitor    Normocytic  anemia, secondary to chronic illness, lung cancer and inadequate nutritional intake. No overt GI bleeding.  -Continue follow up with oncology    Hypokalemia, K+ 2.6 -Oncology managing, patient transferring care to Dr. Melvyn Neth in [place], see phone note 11/15/2022       Attending physician's note   I have taken history, reviewed the chart and examined  the patient. I performed a substantive portion of this encounter, including complete performance of at least one of the key components, in conjunction with the APP. I agree with the Advanced Practitioner's note, impression and recommendations.    GERD with N/V and 3 cm HH Eso dysphagia-history of radiation induced esophagitis on last EGD 10/07/2022.  Also with mediastinal lymphadenopathy to rule out extrinsic compression. Recent PE on eliquis (Dr Melvyn Neth) Lung [facility] with mediastinal lymphadenopathy on Durvalumab.  Followed by Dr. Melvyn Neth.   Plan: -Increased protonix 40mg  po BID 180, 4RF (mail in order) -UGI series (with Tab) -If UGI abn, EGD off eliquis, at [facility] after clearence from Dr Melvyn Neth. -Continue phenergan PRN   Edman Circle, MD [facility] [phone]

## 2022-12-20 NOTE — Patient Instructions (Signed)
_______________________________________________________  If your blood pressure at your visit was 140/90 or greater, please contact your primary care physician to follow up on this.  _______________________________________________________  If you are age 74 or older, your body mass index should be between 23-30. Your Body mass index is 19.89 kg/m. If this is out of the aforementioned range listed, please consider follow up with your Primary Care Provider.  If you are age 16 or younger, your body mass index should be between 19-25. Your Body mass index is 19.89 kg/m. If this is out of the aformentioned range listed, please consider follow up with your Primary Care Provider.   ________________________________________________________  The [facility] providers would like to encourage you to use [facility] to communicate with providers for non-urgent requests or questions.  Due to long hold times on the telephone, sending your provider a message by [facility] may be a faster and more efficient way to get a response.  Please allow 48 business hours for a response.  Please remember that this is for non-urgent requests.  _______________________________________________________  We have sent the following medications to your pharmacy for you to pick up at your convenience: Protonix 2 times a day   Continue phenergan as needed  You have been scheduled for an Upper GI Series at [facility]. Your appointment is on 12-26-2022 at 9am. Please arrive 30 minutes prior to your test for registration. Make sure not to eat or drink anything after midnight on the night before your test. If you need to reschedule, please call radiology at [phone]. ________________________________________________________________ An upper GI series uses x rays to help diagnose problems of the upper GI tract, which includes the esophagus, stomach, and duodenum. The duodenum is the first part of the small intestine. An upper GI  series is conducted by a radiology technologist or a radiologist--a doctor who specializes in x-ray imaging--at a hospital or outpatient center. While sitting or standing in front of an x-ray machine, the patient drinks barium liquid, which is often white and has a chalky consistency and taste. The barium liquid coats the lining of the upper GI tract and makes signs of disease show up more clearly on x rays. X-ray video, called fluoroscopy, is used to view the barium liquid moving through the esophagus, stomach, and duodenum. Additional x rays and fluoroscopy are performed while the patient lies on an x-ray table. To fully coat the upper GI tract with barium liquid, the technologist or radiologist may press on the abdomen or ask the patient to change position. Patients hold still in various positions, allowing the technologist or radiologist to take x rays of the upper GI tract at different angles. If a technologist conducts the upper GI series, a radiologist will later examine the images to look for problems.  This test typically takes about 1 hour to complete. __________________________________________________________________  Thank you,  Dr. Lynann Bologna

## 2022-12-21 DIAGNOSIS — I2694 Multiple subsegmental pulmonary emboli without acute cor pulmonale: Secondary | ICD-10-CM | POA: Diagnosis not present

## 2022-12-21 DIAGNOSIS — Z7901 Long term (current) use of anticoagulants: Secondary | ICD-10-CM | POA: Diagnosis not present

## 2022-12-21 DIAGNOSIS — Z7951 Long term (current) use of inhaled steroids: Secondary | ICD-10-CM | POA: Diagnosis not present

## 2022-12-21 DIAGNOSIS — I700 Atherosclerosis of aorta: Secondary | ICD-10-CM | POA: Diagnosis not present

## 2022-12-21 DIAGNOSIS — K760 Fatty (change of) liver, not elsewhere classified: Secondary | ICD-10-CM | POA: Diagnosis not present

## 2022-12-21 DIAGNOSIS — J439 Emphysema, unspecified: Secondary | ICD-10-CM | POA: Diagnosis not present

## 2022-12-21 DIAGNOSIS — C3491 Malignant neoplasm of unspecified part of right bronchus or lung: Secondary | ICD-10-CM | POA: Diagnosis not present

## 2022-12-21 DIAGNOSIS — M47816 Spondylosis without myelopathy or radiculopathy, lumbar region: Secondary | ICD-10-CM | POA: Diagnosis not present

## 2022-12-21 DIAGNOSIS — I2510 Atherosclerotic heart disease of native coronary artery without angina pectoris: Secondary | ICD-10-CM | POA: Diagnosis not present

## 2022-12-22 DIAGNOSIS — I2510 Atherosclerotic heart disease of native coronary artery without angina pectoris: Secondary | ICD-10-CM | POA: Diagnosis not present

## 2022-12-22 DIAGNOSIS — I700 Atherosclerosis of aorta: Secondary | ICD-10-CM | POA: Diagnosis not present

## 2022-12-22 DIAGNOSIS — Z7951 Long term (current) use of inhaled steroids: Secondary | ICD-10-CM | POA: Diagnosis not present

## 2022-12-22 DIAGNOSIS — Z7901 Long term (current) use of anticoagulants: Secondary | ICD-10-CM | POA: Diagnosis not present

## 2022-12-22 DIAGNOSIS — J439 Emphysema, unspecified: Secondary | ICD-10-CM | POA: Diagnosis not present

## 2022-12-22 DIAGNOSIS — K760 Fatty (change of) liver, not elsewhere classified: Secondary | ICD-10-CM | POA: Diagnosis not present

## 2022-12-22 DIAGNOSIS — C3491 Malignant neoplasm of unspecified part of right bronchus or lung: Secondary | ICD-10-CM | POA: Diagnosis not present

## 2022-12-22 DIAGNOSIS — M47816 Spondylosis without myelopathy or radiculopathy, lumbar region: Secondary | ICD-10-CM | POA: Diagnosis not present

## 2022-12-22 DIAGNOSIS — I2694 Multiple subsegmental pulmonary emboli without acute cor pulmonale: Secondary | ICD-10-CM | POA: Diagnosis not present

## 2022-12-25 ENCOUNTER — Other Ambulatory Visit: Payer: Self-pay

## 2022-12-25 ENCOUNTER — Ambulatory Visit: Payer: Medicare HMO | Admitting: Gastroenterology

## 2022-12-25 DIAGNOSIS — I2699 Other pulmonary embolism without acute cor pulmonale: Secondary | ICD-10-CM

## 2022-12-25 MED ORDER — APIXABAN 5 MG PO TABS: 5.0000 mg | ORAL_TABLET | Freq: Two times a day (BID) | ORAL | 1 refills | Status: DC

## 2022-12-26 ENCOUNTER — Ambulatory Visit (HOSPITAL_COMMUNITY)
Admission: RE | Admit: 2022-12-26 | Discharge: 2022-12-26 | Disposition: A | Discharge status: Home / Self Care | Payer: Medicare HMO | Source: Ambulatory Visit | Attending: Gastroenterology | Admitting: Gastroenterology

## 2022-12-26 DIAGNOSIS — Z7901 Long term (current) use of anticoagulants: Secondary | ICD-10-CM | POA: Diagnosis not present

## 2022-12-26 DIAGNOSIS — I2694 Multiple subsegmental pulmonary emboli without acute cor pulmonale: Secondary | ICD-10-CM | POA: Diagnosis not present

## 2022-12-26 DIAGNOSIS — R1310 Dysphagia, unspecified: Secondary | ICD-10-CM | POA: Diagnosis not present

## 2022-12-26 DIAGNOSIS — K449 Diaphragmatic hernia without obstruction or gangrene: Secondary | ICD-10-CM | POA: Diagnosis not present

## 2022-12-26 DIAGNOSIS — R112 Nausea with vomiting, unspecified: Secondary | ICD-10-CM | POA: Diagnosis not present

## 2022-12-26 DIAGNOSIS — I700 Atherosclerosis of aorta: Secondary | ICD-10-CM | POA: Diagnosis not present

## 2022-12-26 DIAGNOSIS — R1319 Other dysphagia: Secondary | ICD-10-CM

## 2022-12-26 DIAGNOSIS — I2510 Atherosclerotic heart disease of native coronary artery without angina pectoris: Secondary | ICD-10-CM | POA: Diagnosis not present

## 2022-12-26 DIAGNOSIS — M47816 Spondylosis without myelopathy or radiculopathy, lumbar region: Secondary | ICD-10-CM | POA: Diagnosis not present

## 2022-12-26 DIAGNOSIS — Z7951 Long term (current) use of inhaled steroids: Secondary | ICD-10-CM | POA: Diagnosis not present

## 2022-12-26 DIAGNOSIS — J439 Emphysema, unspecified: Secondary | ICD-10-CM | POA: Diagnosis not present

## 2022-12-26 DIAGNOSIS — K219 Gastro-esophageal reflux disease without esophagitis: Secondary | ICD-10-CM

## 2022-12-26 DIAGNOSIS — K760 Fatty (change of) liver, not elsewhere classified: Secondary | ICD-10-CM | POA: Diagnosis not present

## 2022-12-26 DIAGNOSIS — C3491 Malignant neoplasm of unspecified part of right bronchus or lung: Secondary | ICD-10-CM | POA: Diagnosis not present

## 2022-12-26 DIAGNOSIS — K224 Dyskinesia of esophagus: Secondary | ICD-10-CM | POA: Diagnosis not present

## 2022-12-27 ENCOUNTER — Encounter: Payer: Self-pay | Admitting: Oncology

## 2022-12-27 NOTE — Progress Notes (Signed)
Please inform the patient. UGI-mid thoracic esophageal narrowing likely stricture.  Barium tablet did not pass.  Mild esophageal dysmotility.  Small HH I have reviewed the films.  I am also worried about extrinsic compression from mediastinal lymphadenopathy. Plan: -Proceed with CT chest with contrast (as suggested by Dr. Melvyn Neth) for mediastinal lymphadenopathy. -EGD off eliquis, at [facility] after clearence from Dr Melvyn Neth. Send report to family physician

## 2022-12-27 NOTE — Progress Notes (Unsigned)
[facility]  [address] [place],  [place]  [address] [phone]  Clinic Day:  12/28/2022  Referring physician: Weston Settle, MD   HISTORY OF PRESENT ILLNESS:  The patient is a 72 y.o. 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  She recently started her maintenance durvalumab immunotherapy.  She comes in today to be evaluated before heading into her 2nd cycle of durvalumab.  She claims to have tolerated her 1st cycle very well.  She claims to still cough, but this respiratory symptom is not as prominent as it was previously.  She also has had issues with dysphagia, potentially related to her previous radiation, for which GI is evaluating.    With respect to her lung cancer history, she initially underwent a right middle lobectomy in February 2022.  Due to the mediastinal nodal recurrence in May 2024, the patient underwent definitive chemoradiation with weekly carboplatin/paclitaxel, which was completed in late July 2024.    PHYSICAL EXAM:  Blood pressure (!) 135/95, pulse (!) 111, temperature 97.6 F (36.4 C), resp. rate 16, height 5\' 7"  (1.702 m), weight 124 lb 11.2 oz (56.6 kg), SpO2 98%. Wt Readings from Last 3 Encounters:  12/28/22 124 lb 11.2 oz (56.6 kg)  12/20/22 127 lb (57.6 kg)  12/11/22 125 lb 9.6 oz (57 kg)   Body mass index is 19.53 kg/m. Performance status (ECOG): 2 - Symptomatic, <50% confined to bed Physical Exam Constitutional:      Appearance: Normal appearance. She is not ill-appearing.     Comments: A chronically ill-appearing woman in no acute distress.  She is in a wheelchair.  HENT:     Mouth/Throat:     Mouth: Mucous membranes are moist.     Pharynx: Oropharynx is clear. No oropharyngeal exudate or posterior oropharyngeal erythema.  Cardiovascular:     Rate and Rhythm: Normal rate and regular rhythm.     Heart sounds: No murmur heard.     No friction rub. No gallop.  Pulmonary:     Effort: Pulmonary effort is normal. No respiratory distress.     Breath sounds: Decreased air movement present. Examination of the right-upper field reveals decreased breath sounds. Examination of the left-upper field reveals decreased breath sounds. Decreased breath sounds present. No wheezing, rhonchi or rales.  Abdominal:     General: Bowel sounds are normal. There is no distension.     Palpations: Abdomen is soft. There is no mass.     Tenderness: There is no abdominal tenderness.  Musculoskeletal:        General: No swelling.     Right lower leg: No edema.     Left lower leg: No edema.  Lymphadenopathy:     Cervical: No cervical adenopathy.     Upper Body:     Right upper body: No supraclavicular or axillary adenopathy.     Left upper body: No supraclavicular or axillary adenopathy.     Lower Body: No right inguinal adenopathy. No left inguinal adenopathy.  Skin:    General: Skin is warm.     Coloration: Skin is not jaundiced.     Findings: No lesion or rash.  Neurological:     General: No focal deficit present.     Mental Status: She is alert and oriented to person, place, and time. Mental status is at baseline.  Psychiatric:        Mood and Affect:  Mood normal.        Behavior: Behavior normal.        Thought Content: Thought content normal.    LABS:      Latest Ref Rng & Units 12/28/2022   10:06 AM 11/23/2022    1:27 PM 11/14/2022    1:20 PM  CBC  WBC 4.0 - 10.5 K/uL 7.6  6.7     4.8   Hemoglobin 12.0 - 15.0 g/dL 60.4  54.0     98.1   Hematocrit 36.0 - 46.0 % 34.6  35     36.0   Platelets 150 - 400 K/uL 307  290     292      This result is from an external source.      Latest Ref Rng & Units 12/28/2022   10:06 AM 11/23/2022    1:27 PM 11/14/2022    1:20 PM  CMP  Glucose 70 - 99 mg/dL 191   478   BUN 8 - 23 mg/dL 8  4     6    Creatinine 0.44 - 1.00 mg/dL 2.95  0.5     6.21   Sodium 135 - 145 mmol/L 133  128     133    Potassium 3.5 - 5.1 mmol/L 3.1  3.1     2.6   Chloride 98 - 111 mmol/L 97  94     93   CO2 22 - 32 mmol/L 26  31     28    Calcium 8.9 - 10.3 mg/dL 8.4  7.8     7.8   Total Protein 6.5 - 8.1 g/dL 5.9   5.8   Total Bilirubin <1.2 mg/dL 0.3   0.7   Alkaline Phos 38 - 126 U/L 108  123     89   AST 15 - 41 U/L 44  35     20   ALT 0 - 44 U/L 26  15     13       This result is from an external source.   ASSESSMENT & PLAN:  A 72 y.o. 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  In clinic today, I went over all of her CT scan images with her, for which she could see that her mediastinal lymphadenopathy has significantly improved after receiving her chemoradiation.  She will proceed with her 2nd cycle of durvalumab immunotherapy today.  Of note, she will continue taking Eliquis for her incidentally found pulmonary emboli. Overall, she appears to be doing better.  I will see this patient back in 4 weeks before she heads into her 3rd cycle of maintenance durvalumab immunotherapy.  The patient understands all the plans discussed today and is in agreement with them.  Renee Germani Kirby Funk, MD

## 2022-12-28 ENCOUNTER — Inpatient Hospital Stay: Payer: Medicare HMO

## 2022-12-28 ENCOUNTER — Inpatient Hospital Stay: Payer: Medicare HMO | Admitting: Oncology

## 2022-12-28 ENCOUNTER — Telehealth: Payer: Self-pay | Admitting: Oncology

## 2022-12-28 ENCOUNTER — Telehealth: Payer: Self-pay

## 2022-12-28 ENCOUNTER — Inpatient Hospital Stay: Payer: Medicare HMO | Attending: Internal Medicine

## 2022-12-28 ENCOUNTER — Telehealth: Payer: Self-pay | Admitting: Dietician

## 2022-12-28 VITALS — HR 100

## 2022-12-28 DIAGNOSIS — Z5112 Encounter for antineoplastic immunotherapy: Secondary | ICD-10-CM | POA: Insufficient documentation

## 2022-12-28 DIAGNOSIS — Z79899 Other long term (current) drug therapy: Secondary | ICD-10-CM | POA: Insufficient documentation

## 2022-12-28 DIAGNOSIS — C342 Malignant neoplasm of middle lobe, bronchus or lung: Secondary | ICD-10-CM | POA: Insufficient documentation

## 2022-12-28 DIAGNOSIS — C3491 Malignant neoplasm of unspecified part of right bronchus or lung: Secondary | ICD-10-CM

## 2022-12-28 DIAGNOSIS — I2699 Other pulmonary embolism without acute cor pulmonale: Secondary | ICD-10-CM | POA: Insufficient documentation

## 2022-12-28 DIAGNOSIS — R590 Localized enlarged lymph nodes: Secondary | ICD-10-CM | POA: Diagnosis not present

## 2022-12-28 DIAGNOSIS — Z7901 Long term (current) use of anticoagulants: Secondary | ICD-10-CM | POA: Diagnosis not present

## 2022-12-28 LAB — CBC WITH DIFFERENTIAL (CANCER CENTER ONLY)
Abs Immature Granulocytes: 0.02 10*3/uL (ref 0.00–0.07)
Basophils Absolute: 0 10*3/uL (ref 0.0–0.1)
Basophils Relative: 0 %
Eosinophils Absolute: 0.2 10*3/uL (ref 0.0–0.5)
Eosinophils Relative: 2 %
HCT: 34.6 % — ABNORMAL LOW (ref 36.0–46.0)
Hemoglobin: 11.7 g/dL — ABNORMAL LOW (ref 12.0–15.0)
Immature Granulocytes: 0 %
Lymphocytes Relative: 14 %
Lymphs Abs: 1.1 10*3/uL (ref 0.7–4.0)
MCH: 30.5 pg (ref 26.0–34.0)
MCHC: 33.8 g/dL (ref 30.0–36.0)
MCV: 90.1 fL (ref 80.0–100.0)
Monocytes Absolute: 0.8 10*3/uL (ref 0.1–1.0)
Monocytes Relative: 11 %
Neutro Abs: 5.5 10*3/uL (ref 1.7–7.7)
Neutrophils Relative %: 73 %
Platelet Count: 307 10*3/uL (ref 150–400)
RBC: 3.84 MIL/uL — ABNORMAL LOW (ref 3.87–5.11)
RDW: 13.8 % (ref 11.5–15.5)
WBC Count: 7.6 10*3/uL (ref 4.0–10.5)
nRBC: 0 % (ref 0.0–0.2)
nRBC: 0 /100{WBCs}

## 2022-12-28 LAB — CMP (CANCER CENTER ONLY)
ALT: 26 U/L (ref 0–44)
AST: 44 U/L — ABNORMAL HIGH (ref 15–41)
Albumin: 2.9 g/dL — ABNORMAL LOW (ref 3.5–5.0)
Alkaline Phosphatase: 108 U/L (ref 38–126)
Anion gap: 10 (ref 5–15)
BUN: 8 mg/dL (ref 8–23)
CO2: 26 mmol/L (ref 22–32)
Calcium: 8.4 mg/dL — ABNORMAL LOW (ref 8.9–10.3)
Chloride: 97 mmol/L — ABNORMAL LOW (ref 98–111)
Creatinine: 0.66 mg/dL (ref 0.44–1.00)
GFR, Estimated: >60 mL/min (ref >60)
Glucose, Bld: 110 mg/dL — ABNORMAL HIGH (ref 70–99)
Potassium: 3.1 mmol/L — ABNORMAL LOW (ref 3.5–5.1)
Sodium: 133 mmol/L — ABNORMAL LOW (ref 135–145)
Total Bilirubin: 0.3 mg/dL (ref <1.2)
Total Protein: 5.9 g/dL — ABNORMAL LOW (ref 6.5–8.1)

## 2022-12-28 MED ORDER — HEPARIN SOD (PORK) LOCK FLUSH 100 UNIT/ML IV SOLN
500.0000 [IU] | Freq: Once | INTRAVENOUS | Status: AC | PRN
  Administered 2022-12-28: 500 [IU]

## 2022-12-28 MED ORDER — SODIUM CHLORIDE 0.9 % IV SOLN: Freq: Once | INTRAVENOUS | Status: AC

## 2022-12-28 MED ORDER — SODIUM CHLORIDE 0.9 % IV SOLN
1500.0000 mg | Freq: Once | INTRAVENOUS | Status: AC
  Administered 2022-12-28: 1500 mg via INTRAVENOUS
  Filled 2022-12-28: qty 30

## 2022-12-28 MED ORDER — SODIUM CHLORIDE 0.9% FLUSH
10.0000 mL | INTRAVENOUS | Status: DC | PRN
Start: 2022-12-28 — End: 2022-12-28
  Administered 2022-12-28: 10 mL

## 2022-12-28 NOTE — Telephone Encounter (Signed)
Patient has been scheduled for follow-up visit per 12/28/22 LOS.  Pt given an appt calendar with date and time.

## 2022-12-28 NOTE — Telephone Encounter (Addendum)
LVM for patient to call to see about her EGD date for January either 1-13 or 1-27 at [facility] and if not 2-17 but need to see if these dates are ok with her  She will cardiac clearance from Dr Melvyn Neth as well. 1 day hold for her eliquis

## 2022-12-28 NOTE — Telephone Encounter (Signed)
Patient screened for weight loss. First attempt to reach. Provided my cell# on voice mail to return call to set up a nutrition consult.  Gennaro Africa, RDN, LDN Registered Dietitian, [facility] Part Time Remote (Usual office hours: Tuesday-Thursday) Cell: [phone]

## 2022-12-28 NOTE — Patient Instructions (Signed)
[facility] - A DEPT OF [facility]  Discharge Instructions: Thank you for choosing [facility] to provide your oncology and hematology care.  If you have a lab appointment with the Cancer Center, please go directly to the Cancer Center and check in at the registration area.   Wear comfortable clothing and clothing appropriate for easy access to any Portacath or PICC line.   We strive to give you quality time with your provider. You may need to reschedule your appointment if you arrive late (15 or more minutes).  Arriving late affects you and other patients whose appointments are after yours.  Also, if you miss three or more appointments without notifying the office, you may be dismissed from the clinic at the provider's discretion.      For prescription refill requests, have your pharmacy contact our office and allow 72 hours for refills to be completed.    Today you received the following chemotherapy and/or immunotherapy agents Durvalumab      To help prevent nausea and vomiting after your treatment, we encourage you to take your nausea medication as directed.  BELOW ARE SYMPTOMS THAT SHOULD BE REPORTED IMMEDIATELY: *FEVER GREATER THAN 100.4 F (38 C) OR HIGHER *CHILLS OR SWEATING *NAUSEA AND VOMITING THAT IS NOT CONTROLLED WITH YOUR NAUSEA MEDICATION *UNUSUAL SHORTNESS OF BREATH *UNUSUAL BRUISING OR BLEEDING *URINARY PROBLEMS (pain or burning when urinating, or frequent urination) *BOWEL PROBLEMS (unusual diarrhea, constipation, pain near the anus) TENDERNESS IN MOUTH AND THROAT WITH OR WITHOUT PRESENCE OF ULCERS (sore throat, sores in mouth, or a toothache) UNUSUAL RASH, SWELLING OR PAIN  UNUSUAL VAGINAL DISCHARGE OR ITCHING   Items with * indicate a potential emergency and should be followed up as soon as possible or go to the Emergency Department if any problems should occur.  Please show the CHEMOTHERAPY ALERT CARD or IMMUNOTHERAPY  ALERT CARD at check-in to the Emergency Department and triage nurse.  Should you have questions after your visit or need to cancel or reschedule your appointment, please contact [facility] - A DEPT OF [facility]  [facility]  Dept: [phone]  and follow the prompts.  Office hours are 8:00 a.m. to 4:30 p.m. Monday - Friday. Please note that voicemails left after 4:00 p.m. may not be returned until the following business day.  We are closed weekends and major holidays. You have access to a nurse at all times for urgent questions. Please call the main number to the clinic Dept: [phone] and follow the prompts.  For any non-urgent questions, you may also contact your provider using MyChart. We now offer e-Visits for anyone 48 and older to request care online for non-urgent symptoms. For details visit [email or web address].   Also download the MyChart app! Go to the app store, search "MyChart", open the app, select [facility], and log in with your MyChart username and password.

## 2022-12-28 NOTE — Progress Notes (Signed)
Patient educated on importance of taking potassium prescription. Patient states she will try her best. Dr. Melvyn Neth made aware. Patient given list of potassium rich foods.

## 2022-12-29 DIAGNOSIS — C3491 Malignant neoplasm of unspecified part of right bronchus or lung: Secondary | ICD-10-CM | POA: Diagnosis not present

## 2022-12-29 DIAGNOSIS — M47816 Spondylosis without myelopathy or radiculopathy, lumbar region: Secondary | ICD-10-CM | POA: Diagnosis not present

## 2022-12-29 DIAGNOSIS — J439 Emphysema, unspecified: Secondary | ICD-10-CM | POA: Diagnosis not present

## 2022-12-29 DIAGNOSIS — I2694 Multiple subsegmental pulmonary emboli without acute cor pulmonale: Secondary | ICD-10-CM | POA: Diagnosis not present

## 2022-12-29 DIAGNOSIS — Z7901 Long term (current) use of anticoagulants: Secondary | ICD-10-CM | POA: Diagnosis not present

## 2022-12-29 DIAGNOSIS — I2510 Atherosclerotic heart disease of native coronary artery without angina pectoris: Secondary | ICD-10-CM | POA: Diagnosis not present

## 2022-12-29 DIAGNOSIS — I700 Atherosclerosis of aorta: Secondary | ICD-10-CM | POA: Diagnosis not present

## 2022-12-29 DIAGNOSIS — K760 Fatty (change of) liver, not elsewhere classified: Secondary | ICD-10-CM | POA: Diagnosis not present

## 2022-12-29 DIAGNOSIS — Z7951 Long term (current) use of inhaled steroids: Secondary | ICD-10-CM | POA: Diagnosis not present

## 2022-12-29 NOTE — Addendum Note (Signed)
Addended by: Alberteen Sam E on: 12/29/2022 10:38 AM   Modules accepted: Orders

## 2022-12-29 NOTE — Telephone Encounter (Signed)
Scheduled 03-05-2023 and instructions and referral has been and will call patient back regarding instructions

## 2022-12-31 ENCOUNTER — Other Ambulatory Visit: Payer: Self-pay

## 2023-01-01 DIAGNOSIS — Z7901 Long term (current) use of anticoagulants: Secondary | ICD-10-CM | POA: Diagnosis not present

## 2023-01-01 DIAGNOSIS — Z7951 Long term (current) use of inhaled steroids: Secondary | ICD-10-CM | POA: Diagnosis not present

## 2023-01-01 DIAGNOSIS — J439 Emphysema, unspecified: Secondary | ICD-10-CM | POA: Diagnosis not present

## 2023-01-01 DIAGNOSIS — K760 Fatty (change of) liver, not elsewhere classified: Secondary | ICD-10-CM | POA: Diagnosis not present

## 2023-01-01 DIAGNOSIS — M47816 Spondylosis without myelopathy or radiculopathy, lumbar region: Secondary | ICD-10-CM | POA: Diagnosis not present

## 2023-01-01 DIAGNOSIS — C3491 Malignant neoplasm of unspecified part of right bronchus or lung: Secondary | ICD-10-CM | POA: Diagnosis not present

## 2023-01-01 DIAGNOSIS — I700 Atherosclerosis of aorta: Secondary | ICD-10-CM | POA: Diagnosis not present

## 2023-01-01 DIAGNOSIS — I2694 Multiple subsegmental pulmonary emboli without acute cor pulmonale: Secondary | ICD-10-CM | POA: Diagnosis not present

## 2023-01-01 DIAGNOSIS — I2510 Atherosclerotic heart disease of native coronary artery without angina pectoris: Secondary | ICD-10-CM | POA: Diagnosis not present

## 2023-01-02 ENCOUNTER — Telehealth: Payer: Self-pay | Admitting: Dietician

## 2023-01-02 DIAGNOSIS — H5213 Myopia, bilateral: Secondary | ICD-10-CM | POA: Diagnosis not present

## 2023-01-02 NOTE — Telephone Encounter (Signed)
Patient screened for weight loss. Second attempt to reach. Provided my cell# on voice mail to return call to set up a nutrition consult.  Gennaro Africa, RDN, LDN Registered Dietitian, [facility] Part Time Remote (Usual office hours: Tuesday-Thursday) Cell: [phone]

## 2023-01-08 ENCOUNTER — Ambulatory Visit (INDEPENDENT_AMBULATORY_CARE_PROVIDER_SITE_OTHER): Payer: Medicare HMO | Admitting: Physician Assistant

## 2023-01-08 ENCOUNTER — Encounter: Payer: Self-pay | Admitting: Physician Assistant

## 2023-01-08 VITALS — BP 130/80 | HR 106 | Temp 97.7°F | Wt 125.0 lb

## 2023-01-08 DIAGNOSIS — R5383 Other fatigue: Secondary | ICD-10-CM

## 2023-01-08 DIAGNOSIS — C3491 Malignant neoplasm of unspecified part of right bronchus or lung: Secondary | ICD-10-CM

## 2023-01-08 DIAGNOSIS — K2960 Other gastritis without bleeding: Secondary | ICD-10-CM

## 2023-01-08 NOTE — Progress Notes (Signed)
Subjective:  Patient ID: [name], female    DOB: 05/27/1950  Age: 72 y.o. MRN: [id number]  Chief Complaint  Patient presents with   Weight Loss        Pt with  nonsmall cell lung cancer, adenocarcinoma resulting with right middle lobectomy on 03/29/20 - she did have disease recurrence 5/24 in mediastinal lymph nodes She had been undergoing chemo and radiation with Dr Shirline Frees and has completed that and now has been following with Dr Melvyn Neth Pt has seen Dr Chales Abrahams and is advised to take protonix 40mg  bid and will be having an endoscopy after the first of the year She has been losing weight but weight remains stable up one pound on this visit She is drinking one protein shake daily      08/22/2022   11:04 AM 08/01/2022    1:07 PM 04/12/2022    3:48 PM 12/08/2021    8:31 AM 08/18/2021   11:33 AM  Depression screen PHQ 2/9  Decreased Interest 0 0 0 0 0  Down, Depressed, Hopeless 0 0 0 0 0  PHQ - 2 Score 0 0 0 0 0  Altered sleeping 0   0   Tired, decreased energy 3   0   Change in appetite 3   0   Feeling bad or failure about yourself  0   0   Trouble concentrating 0   0   Moving slowly or fidgety/restless 0   0   Suicidal thoughts 0   0   PHQ-9 Score 6   0   Difficult doing work/chores Not difficult at all   Not difficult at all         06/09/2020    9:59 AM 12/10/2020    8:18 AM 08/18/2021   11:53 AM 04/12/2022    3:48 PM 08/22/2022   11:04 AM  Fall Risk  Falls in the past year? 0 0 0 0 0  Was there an injury with Fall? 0 0 0 0 0  Fall Risk Category Calculator 0 0 0 0 0  Fall Risk Category (Retired) Low Low Low    (RETIRED) Patient Fall Risk Level Low fall risk Low fall risk Low fall risk    Patient at Risk for Falls Due to No Fall Risks No Fall Risks No Fall Risks No Fall Risks No Fall Risks  Fall risk Follow up Falls evaluation completed Falls evaluation completed Falls evaluation completed Falls evaluation completed Falls evaluation completed    CONSTITUTIONAL: general  fatigue - weight stable E/N/T: Negative for ear pain, nasal congestion and sore throat.  CARDIOVASCULAR: Negative for chest pain, dizziness, palpitations and pedal edema.  RESPIRATORY: Negative for recent cough and dyspnea.  GASTROINTESTINAL:reflux symptoms controlled with protonix     Current Outpatient Medications:    alum & mag hydroxide-simeth (MAALOX MAX) 400-400-40 MG/5ML suspension, Take 15 mLs by mouth every 6 (six) hours as needed for indigestion., Disp: 355 mL, Rfl: 1   apixaban (ELIQUIS) 5 MG TABS tablet, Take 1 tablet (5 mg total) by mouth 2 (two) times daily., Disp: 60 tablet, Rfl: 1   Fluticasone-Umeclidin-Vilant (TRELEGY ELLIPTA) 100-62.5-25 MCG/ACT AEPB, Inhale 1 puff into the lungs daily. (Patient taking differently: Inhale 1 puff into the lungs daily as needed (For shortness of breath).), Disp: 2 each, Rfl: 0   lidocaine (XYLOCAINE) 2 % solution, Use as directed 15 mLs in the mouth or throat every 6 (six) hours as needed for mouth pain., Disp: 100 mL, Rfl: 0  lidocaine-prilocaine (EMLA) cream, Apply 1 Application topically as needed. Apply 1 hour prior to lab appt. Cover with plastic wrap,, Disp: 30 g, Rfl: 0   mirtazapine (REMERON) 30 MG tablet, Take 1 tablet (30 mg total) by mouth at bedtime., Disp: 30 tablet, Rfl: 2   montelukast (SINGULAIR) 10 MG tablet, TAKE 1 TABLET AT BEDTIME, Disp: 90 tablet, Rfl: 1   mupirocin ointment (BACTROBAN) 2 %, Apply 1 application topically 2 (two) times daily. (Patient taking differently: Apply 1 application  topically 2 (two) times daily as needed (wound care).), Disp: 22 g, Rfl: 2   ondansetron (ZOFRAN-ODT) 8 MG disintegrating tablet, Take 1 tablet (8 mg total) by mouth every 8 (eight) hours as needed for nausea or vomiting., Disp: 30 tablet, Rfl: 2   pantoprazole (PROTONIX) 40 MG tablet, Take 1 tablet (40 mg total) by mouth 2 (two) times daily., Disp: 180 tablet, Rfl: 3   polyethylene glycol (MIRALAX / GLYCOLAX) 17 g packet, Take 17 g by  mouth daily as needed for moderate constipation., Disp: , Rfl:    prochlorperazine (COMPAZINE) 25 MG suppository, Place 1 suppository (25 mg total) rectally every 12 (twelve) hours., Disp: 30 suppository, Rfl: 1   promethazine (PHENERGAN) 25 MG tablet, Take 1 tablet (25 mg total) by mouth every 6 (six) hours as needed for nausea., Disp: 30 tablet, Rfl: 3  Past Medical History:  Diagnosis Date   Abnormal chest x-ray 01/19/2020   Acute laryngopharyngitis 03/18/2020   Aortic atherosclerosis (HCC) 01/28/2020   Arthritis    Cardiac murmur 03/01/2020   COPD (chronic obstructive pulmonary disease) (HCC)    Coronary artery calcification seen on CT scan 03/01/2020   Cough 01/12/2020   COVID-19 03/10/2020   Dyspnea    on exertion,after getting covid   Family history of adverse reaction to anesthesia    brothr had n/v   GERD (gastroesophageal reflux disease)    Hiatal hernia    Hyperlipemia    Lung cancer, middle lobe (HCC)    middle lobe removed   Pneumonia    PONV (postoperative nausea and vomiting)    Pre-operative cardiovascular examination 03/01/2020   Right lower lobe lung mass 01/28/2020   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   Visit for screening mammogram 01/28/2020   Objective:  PHYSICAL EXAM:   VS: BP 130/80   Pulse (!) 106   Temp 97.7 F (36.5 C)   Wt 125 lb (56.7 kg)   SpO2 99%   BMI 19.58 kg/m   GEN: Well nourished, well developed, in no acute distress   Cardiac: RRR; no murmurs, rubs, or gallops,no edema - Respiratory:  normal respiratory rate and pattern with no distress - normal breath sounds with no rales, rhonchi, wheezes or rubs  Psych: euthymic mood, appropriate affect and demeanor   Assessment & Plan:    Primary lung cancer, right (HCC) Continue follow up with oncology  Radiation gastritis Follow up with GI as scheduled Continue protonix bid Recommend protein shakes/ensure      Follow-up: Return in about 2 months (around  03/10/2023) for follow-up.  An After Visit Summary was printed and given to the patient.  Jettie Pagan [facility] [phone]

## 2023-01-09 ENCOUNTER — Other Ambulatory Visit: Payer: Self-pay

## 2023-01-09 DIAGNOSIS — C3491 Malignant neoplasm of unspecified part of right bronchus or lung: Secondary | ICD-10-CM | POA: Diagnosis not present

## 2023-01-09 DIAGNOSIS — I700 Atherosclerosis of aorta: Secondary | ICD-10-CM | POA: Diagnosis not present

## 2023-01-09 DIAGNOSIS — Z7951 Long term (current) use of inhaled steroids: Secondary | ICD-10-CM | POA: Diagnosis not present

## 2023-01-09 DIAGNOSIS — I2694 Multiple subsegmental pulmonary emboli without acute cor pulmonale: Secondary | ICD-10-CM | POA: Diagnosis not present

## 2023-01-09 DIAGNOSIS — M47816 Spondylosis without myelopathy or radiculopathy, lumbar region: Secondary | ICD-10-CM | POA: Diagnosis not present

## 2023-01-09 DIAGNOSIS — J439 Emphysema, unspecified: Secondary | ICD-10-CM | POA: Diagnosis not present

## 2023-01-09 DIAGNOSIS — I2510 Atherosclerotic heart disease of native coronary artery without angina pectoris: Secondary | ICD-10-CM | POA: Diagnosis not present

## 2023-01-09 DIAGNOSIS — Z7901 Long term (current) use of anticoagulants: Secondary | ICD-10-CM | POA: Diagnosis not present

## 2023-01-09 DIAGNOSIS — K760 Fatty (change of) liver, not elsewhere classified: Secondary | ICD-10-CM | POA: Diagnosis not present

## 2023-01-15 ENCOUNTER — Telehealth: Payer: Self-pay

## 2023-01-15 NOTE — Telephone Encounter (Signed)
I spoke with patient today and she said she is still having trouble with her eating. She did have to vomit yesterday. But she is able to keep liquids down pretty good and does a protein shake a day. She does have her procedure schedule for 03-05-2023 with Dr Chales Abrahams. She had a PE in her last CT so Dr Chales Abrahams did want to wait 3 months before trying to take her off her Eliquis. Can you please just follow up on this patient to make sure she is ok from a medical perspective?

## 2023-01-15 NOTE — Telephone Encounter (Signed)
Dr Melvyn Neth,  This patient is suppose to have an EGD in 03-15-2023 at the hospital with Dr Chales Abrahams. Please advise if patient will be able to come off her Eliquis 1 day prior to the procedure?   [facility] Pre-operative Risk Assessment     Request for surgical clearance:     Endoscopy Procedure  What type of surgery is being performed?     EGD  When is this surgery scheduled?     03-05-2023  What type of clearance is required ?   Pharmacy  Are there any medications that need to be held prior to surgery and how long? Eliquis 1 day hold  Practice name and name of physician performing surgery?      [facility]  What is your office phone and fax number?      Phone- [phone]  Fax- [phone]  Anesthesia type (None, local, MAC, general) ?       MAC   Please route your response to [employer], [facility])

## 2023-01-16 ENCOUNTER — Telehealth: Payer: Self-pay | Admitting: Dietician

## 2023-01-16 DIAGNOSIS — C3491 Malignant neoplasm of unspecified part of right bronchus or lung: Secondary | ICD-10-CM | POA: Diagnosis not present

## 2023-01-16 DIAGNOSIS — I700 Atherosclerosis of aorta: Secondary | ICD-10-CM | POA: Diagnosis not present

## 2023-01-16 DIAGNOSIS — Z7901 Long term (current) use of anticoagulants: Secondary | ICD-10-CM | POA: Diagnosis not present

## 2023-01-16 DIAGNOSIS — Z7951 Long term (current) use of inhaled steroids: Secondary | ICD-10-CM | POA: Diagnosis not present

## 2023-01-16 DIAGNOSIS — I2510 Atherosclerotic heart disease of native coronary artery without angina pectoris: Secondary | ICD-10-CM | POA: Diagnosis not present

## 2023-01-16 DIAGNOSIS — K760 Fatty (change of) liver, not elsewhere classified: Secondary | ICD-10-CM | POA: Diagnosis not present

## 2023-01-16 DIAGNOSIS — M47816 Spondylosis without myelopathy or radiculopathy, lumbar region: Secondary | ICD-10-CM | POA: Diagnosis not present

## 2023-01-16 DIAGNOSIS — J439 Emphysema, unspecified: Secondary | ICD-10-CM | POA: Diagnosis not present

## 2023-01-16 DIAGNOSIS — I2694 Multiple subsegmental pulmonary emboli without acute cor pulmonale: Secondary | ICD-10-CM | POA: Diagnosis not present

## 2023-01-16 NOTE — Telephone Encounter (Signed)
Pt was contacted for a symptom update. Pt stated that she is drinking OK and able to eat some solid food in small amounts. Pt states that she is a little weak but she is up moving around and also working with physical therapy: Pt verbalized understanding with all questions answered.

## 2023-01-16 NOTE — Telephone Encounter (Signed)
Patient screened for weight loss. Third and final attempt to reach patient by telephone. Have left messages with return number. Please consult RD for future needs.   Provided my cell# in a text as voicemail was full to  return call to set up a nutrition consult.  Gennaro Africa, RDN, LDN Registered Dietitian, [facility] Part Time Remote (Usual office hours: Tuesday-Thursday) Cell: [phone]

## 2023-01-23 DIAGNOSIS — K760 Fatty (change of) liver, not elsewhere classified: Secondary | ICD-10-CM | POA: Diagnosis not present

## 2023-01-23 DIAGNOSIS — I700 Atherosclerosis of aorta: Secondary | ICD-10-CM | POA: Diagnosis not present

## 2023-01-23 DIAGNOSIS — Z7951 Long term (current) use of inhaled steroids: Secondary | ICD-10-CM | POA: Diagnosis not present

## 2023-01-23 DIAGNOSIS — Z7901 Long term (current) use of anticoagulants: Secondary | ICD-10-CM | POA: Diagnosis not present

## 2023-01-23 DIAGNOSIS — C3491 Malignant neoplasm of unspecified part of right bronchus or lung: Secondary | ICD-10-CM | POA: Diagnosis not present

## 2023-01-23 DIAGNOSIS — J439 Emphysema, unspecified: Secondary | ICD-10-CM | POA: Diagnosis not present

## 2023-01-23 DIAGNOSIS — I2510 Atherosclerotic heart disease of native coronary artery without angina pectoris: Secondary | ICD-10-CM | POA: Diagnosis not present

## 2023-01-23 DIAGNOSIS — M47816 Spondylosis without myelopathy or radiculopathy, lumbar region: Secondary | ICD-10-CM | POA: Diagnosis not present

## 2023-01-23 DIAGNOSIS — I2694 Multiple subsegmental pulmonary emboli without acute cor pulmonale: Secondary | ICD-10-CM | POA: Diagnosis not present

## 2023-01-24 NOTE — Progress Notes (Signed)
[facility]  [address] [place],  [place]  [address] [phone]  Clinic Day:  01/25/2023  Referring physician: [name], PA-C   HISTORY OF PRESENT ILLNESS:  The patient is a 72 y.o. 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  She recently started her maintenance durvalumab immunotherapy.  She comes in today to be evaluated before heading into her 3rd cycle of durvalumab.  She claims to have tolerated her 2nd cycle very well.  She claims to still cough, but this respiratory symptom is not as prominent as it was previously.   With respect to her lung cancer history, she initially underwent a right middle lobectomy in February 2022.  Due to the mediastinal nodal recurrence in May 2024, the patient underwent definitive chemoradiation with weekly carboplatin/paclitaxel, which was completed in late July 2024.    PHYSICAL EXAM:  Blood pressure 138/72, pulse 96, temperature 97.9 F (36.6 C), resp. rate 14, height 5\' 7"  (1.702 m), weight 125 lb 1.6 oz (56.7 kg), SpO2 99%. Wt Readings from Last 3 Encounters:  01/25/23 125 lb 1.6 oz (56.7 kg)  01/08/23 125 lb (56.7 kg)  12/28/22 124 lb 11.2 oz (56.6 kg)   Body mass index is 19.59 kg/m. Performance status (ECOG): 2 - Symptomatic, <50% confined to bed Physical Exam Constitutional:      Appearance: Normal appearance. She is not ill-appearing.     Comments: A chronically ill-appearing woman in no acute distress.  She is ambulating with a cane  HENT:     Mouth/Throat:     Mouth: Mucous membranes are moist.     Pharynx: Oropharynx is clear. No oropharyngeal exudate or posterior oropharyngeal erythema.  Cardiovascular:     Rate and Rhythm: Normal rate and regular rhythm.     Heart sounds: No murmur heard.    No friction rub. No gallop.  Pulmonary:     Effort: Pulmonary effort is normal. No respiratory distress.     Breath  sounds: Decreased air movement present. Examination of the right-upper field reveals decreased breath sounds. Examination of the left-upper field reveals decreased breath sounds. Decreased breath sounds present. No wheezing, rhonchi or rales.  Abdominal:     General: Bowel sounds are normal. There is no distension.     Palpations: Abdomen is soft. There is no mass.     Tenderness: There is no abdominal tenderness.  Musculoskeletal:        General: No swelling.     Right lower leg: No edema.     Left lower leg: No edema.  Lymphadenopathy:     Cervical: No cervical adenopathy.     Upper Body:     Right upper body: No supraclavicular or axillary adenopathy.     Left upper body: No supraclavicular or axillary adenopathy.     Lower Body: No right inguinal adenopathy. No left inguinal adenopathy.  Skin:    General: Skin is warm.     Coloration: Skin is not jaundiced.     Findings: No lesion or rash.  Neurological:     General: No focal deficit present.     Mental Status: She is alert and oriented to person, place, and time. Mental status is at baseline.  Psychiatric:        Mood and Affect: Mood normal.        Behavior: Behavior normal.        Thought Content: Thought  content normal.    LABS:      Latest Ref Rng & Units 01/25/2023   10:05 AM 12/28/2022   10:06 AM 11/23/2022    1:27 PM  CBC  WBC 4.0 - 10.5 K/uL 8.0  7.6  6.7      Hemoglobin 12.0 - 15.0 g/dL 09.8  11.9  14.7      Hematocrit 36.0 - 46.0 % 35.6  34.6  35      Platelets 150 - 400 K/uL 342  307  290         This result is from an external source.      Latest Ref Rng & Units 01/25/2023   10:05 AM 12/28/2022   10:06 AM 11/23/2022    1:27 PM  CMP  Glucose 70 - 99 mg/dL 829  562    BUN 8 - 23 mg/dL 13  8  4       Creatinine 0.44 - 1.00 mg/dL 1.30  8.65  0.5      Sodium 135 - 145 mmol/L 134  133  128      Potassium 3.5 - 5.1 mmol/L 3.6  3.1  3.1      Chloride 98 - 111 mmol/L 98  97  94      CO2 22 - 32 mmol/L 24  26   31       Calcium 8.9 - 10.3 mg/dL 9.0  8.4  7.8      Total Protein 6.5 - 8.1 g/dL 6.3  5.9    Total Bilirubin <1.2 mg/dL 0.3  0.3    Alkaline Phos 38 - 126 U/L 114  108  123      AST 15 - 41 U/L 45  44  35      ALT 0 - 44 U/L 24  26  15          This result is from an external source.   ASSESSMENT & PLAN:  A 72 y.o. 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   She will proceed with her 3rd cycle of durvalumab immunotherapy today.  Of note, she will continue taking Eliquis for her incidentally found pulmonary emboli through the end of this calendar year.   Overall, she appears to be doing better.  I will see this patient back in 4 weeks before she heads into her 4th cycle of maintenance durvalumab immunotherapy.  The patient understands all the plans discussed today and is in agreement with them.  [place] Boy[place] Kirby Funk, MD

## 2023-01-25 ENCOUNTER — Encounter: Payer: Self-pay | Admitting: Oncology

## 2023-01-25 ENCOUNTER — Inpatient Hospital Stay: Payer: Medicare HMO

## 2023-01-25 ENCOUNTER — Inpatient Hospital Stay (HOSPITAL_BASED_OUTPATIENT_CLINIC_OR_DEPARTMENT_OTHER): Payer: Medicare HMO | Admitting: Oncology

## 2023-01-25 ENCOUNTER — Inpatient Hospital Stay: Payer: Medicare HMO | Attending: Internal Medicine

## 2023-01-25 VITALS — BP 138/72 | HR 96 | Temp 97.9°F | Resp 14 | Ht 67.0 in | Wt 125.1 lb

## 2023-01-25 DIAGNOSIS — Z7901 Long term (current) use of anticoagulants: Secondary | ICD-10-CM | POA: Diagnosis not present

## 2023-01-25 DIAGNOSIS — C3491 Malignant neoplasm of unspecified part of right bronchus or lung: Secondary | ICD-10-CM | POA: Diagnosis not present

## 2023-01-25 DIAGNOSIS — Z5112 Encounter for antineoplastic immunotherapy: Secondary | ICD-10-CM | POA: Diagnosis not present

## 2023-01-25 DIAGNOSIS — Z79899 Other long term (current) drug therapy: Secondary | ICD-10-CM | POA: Diagnosis not present

## 2023-01-25 DIAGNOSIS — C342 Malignant neoplasm of middle lobe, bronchus or lung: Secondary | ICD-10-CM | POA: Insufficient documentation

## 2023-01-25 DIAGNOSIS — I2699 Other pulmonary embolism without acute cor pulmonale: Secondary | ICD-10-CM | POA: Diagnosis not present

## 2023-01-25 LAB — CBC WITH DIFFERENTIAL (CANCER CENTER ONLY)
Abs Immature Granulocytes: 0.03 10*3/uL (ref 0.00–0.07)
Basophils Absolute: 0 10*3/uL (ref 0.0–0.1)
Basophils Relative: 1 %
Eosinophils Absolute: 0.1 10*3/uL (ref 0.0–0.5)
Eosinophils Relative: 1 %
HCT: 35.6 % — ABNORMAL LOW (ref 36.0–46.0)
Hemoglobin: 11.7 g/dL — ABNORMAL LOW (ref 12.0–15.0)
Immature Granulocytes: 0 %
Lymphocytes Relative: 13 %
Lymphs Abs: 1.1 10*3/uL (ref 0.7–4.0)
MCH: 29.2 pg (ref 26.0–34.0)
MCHC: 32.9 g/dL (ref 30.0–36.0)
MCV: 88.8 fL (ref 80.0–100.0)
Monocytes Absolute: 0.8 10*3/uL (ref 0.1–1.0)
Monocytes Relative: 10 %
Neutro Abs: 6 10*3/uL (ref 1.7–7.7)
Neutrophils Relative %: 75 %
Platelet Count: 342 10*3/uL (ref 150–400)
RBC: 4.01 MIL/uL (ref 3.87–5.11)
RDW: 13.4 % (ref 11.5–15.5)
WBC Count: 8 10*3/uL (ref 4.0–10.5)
nRBC: 0 % (ref 0.0–0.2)
nRBC: 0 /100{WBCs}

## 2023-01-25 LAB — CMP (CANCER CENTER ONLY)
ALT: 24 U/L (ref 0–44)
AST: 45 U/L — ABNORMAL HIGH (ref 15–41)
Albumin: 3.1 g/dL — ABNORMAL LOW (ref 3.5–5.0)
Alkaline Phosphatase: 114 U/L (ref 38–126)
Anion gap: 12 (ref 5–15)
BUN: 13 mg/dL (ref 8–23)
CO2: 24 mmol/L (ref 22–32)
Calcium: 9 mg/dL (ref 8.9–10.3)
Chloride: 98 mmol/L (ref 98–111)
Creatinine: 0.69 mg/dL (ref 0.44–1.00)
GFR, Estimated: >60 mL/min (ref >60)
Glucose, Bld: 111 mg/dL — ABNORMAL HIGH (ref 70–99)
Potassium: 3.6 mmol/L (ref 3.5–5.1)
Sodium: 134 mmol/L — ABNORMAL LOW (ref 135–145)
Total Bilirubin: 0.3 mg/dL (ref <1.2)
Total Protein: 6.3 g/dL — ABNORMAL LOW (ref 6.5–8.1)

## 2023-01-25 LAB — TSH: TSH: 3.365 u[IU]/mL (ref 0.350–4.500)

## 2023-01-25 MED ORDER — SODIUM CHLORIDE 0.9 % IV SOLN
Freq: Once | INTRAVENOUS | Status: AC
Start: 2023-01-25 — End: 2023-01-25

## 2023-01-25 MED ORDER — SODIUM CHLORIDE 0.9% FLUSH
10.0000 mL | INTRAVENOUS | Status: DC | PRN
Start: 2023-01-25 — End: 2023-01-25
  Administered 2023-01-25: 10 mL

## 2023-01-25 MED ORDER — DURVALUMAB 500 MG/10ML IV SOLN
1500.0000 mg | Freq: Once | INTRAVENOUS | Status: AC
  Administered 2023-01-25: 1500 mg via INTRAVENOUS
  Filled 2023-01-25: qty 30

## 2023-01-25 MED ORDER — HEPARIN SOD (PORK) LOCK FLUSH 100 UNIT/ML IV SOLN
500.0000 [IU] | Freq: Once | INTRAVENOUS | Status: AC | PRN
Start: 2023-01-25 — End: 2023-01-25
  Administered 2023-01-25: 500 [IU]

## 2023-01-25 NOTE — Patient Instructions (Signed)
[facility] [place] - A DEPT OF [facility]  Discharge Instructions: Thank you for choosing [facility] to provide your oncology and hematology care.  If you have a lab appointment with the Cancer Center, please go directly to the Cancer Center and check in at the registration area.   Wear comfortable clothing and clothing appropriate for easy access to any Portacath or PICC line.   We strive to give you quality time with your provider. You may need to reschedule your appointment if you arrive late (15 or more minutes).  Arriving late affects you and other patients whose appointments are after yours.  Also, if you miss three or more appointments without notifying the office, you may be dismissed from the clinic at the provider's discretion.      For prescription refill requests, have your pharmacy contact our office and allow 72 hours for refills to be completed.    Today you received the following chemotherapy and/or immunotherapy agents Durvalumab      To help prevent nausea and vomiting after your treatment, we encourage you to take your nausea medication as directed.  BELOW ARE SYMPTOMS THAT SHOULD BE REPORTED IMMEDIATELY: *FEVER GREATER THAN 100.4 F (38 C) OR HIGHER *CHILLS OR SWEATING *NAUSEA AND VOMITING THAT IS NOT CONTROLLED WITH YOUR NAUSEA MEDICATION *UNUSUAL SHORTNESS OF BREATH *UNUSUAL BRUISING OR BLEEDING *URINARY PROBLEMS (pain or burning when urinating, or frequent urination) *BOWEL PROBLEMS (unusual diarrhea, constipation, pain near the anus) TENDERNESS IN MOUTH AND THROAT WITH OR WITHOUT PRESENCE OF ULCERS (sore throat, sores in mouth, or a toothache) UNUSUAL RASH, SWELLING OR PAIN  UNUSUAL VAGINAL DISCHARGE OR ITCHING   Items with * indicate a potential emergency and should be followed up as soon as possible or go to the Emergency Department if any problems should occur.  Please show the CHEMOTHERAPY ALERT CARD or IMMUNOTHERAPY  ALERT CARD at check-in to the Emergency Department and triage nurse.  Should you have questions after your visit or need to cancel or reschedule your appointment, please contact [facility] [place] - A DEPT OF [facility]  Dept: [phone]  and follow the prompts.  Office hours are 8:00 a.m. to 4:30 p.m. Monday - Friday. Please note that voicemails left after 4:00 p.m. may not be returned until the following business day.  We are closed weekends and major holidays. You have access to a nurse at all times for urgent questions. Please call the main number to the clinic Dept: [phone] and follow the prompts.  For any non-urgent questions, you may also contact your provider using MyChart. We now offer e-Visits for anyone 46 and older to request care online for non-urgent symptoms. For details visit [email or web address].   Also download the MyChart app! Go to the app store, search "MyChart", open the app, select [place], and log in with your MyChart username and password.

## 2023-01-26 ENCOUNTER — Other Ambulatory Visit: Payer: Self-pay

## 2023-01-27 DIAGNOSIS — R051 Acute cough: Secondary | ICD-10-CM | POA: Diagnosis not present

## 2023-01-27 DIAGNOSIS — R0981 Nasal congestion: Secondary | ICD-10-CM | POA: Diagnosis not present

## 2023-01-27 DIAGNOSIS — Z20822 Contact with and (suspected) exposure to covid-19: Secondary | ICD-10-CM | POA: Diagnosis not present

## 2023-01-27 LAB — T4: T4, Total: 9.3 ug/dL (ref 4.5–12.0)

## 2023-01-28 ENCOUNTER — Encounter: Payer: Self-pay | Admitting: Oncology

## 2023-01-31 ENCOUNTER — Other Ambulatory Visit: Payer: Medicare HMO

## 2023-01-31 DIAGNOSIS — K760 Fatty (change of) liver, not elsewhere classified: Secondary | ICD-10-CM | POA: Diagnosis not present

## 2023-01-31 DIAGNOSIS — I700 Atherosclerosis of aorta: Secondary | ICD-10-CM | POA: Diagnosis not present

## 2023-01-31 DIAGNOSIS — I2510 Atherosclerotic heart disease of native coronary artery without angina pectoris: Secondary | ICD-10-CM | POA: Diagnosis not present

## 2023-01-31 DIAGNOSIS — Z7951 Long term (current) use of inhaled steroids: Secondary | ICD-10-CM | POA: Diagnosis not present

## 2023-01-31 DIAGNOSIS — Z7901 Long term (current) use of anticoagulants: Secondary | ICD-10-CM | POA: Diagnosis not present

## 2023-01-31 DIAGNOSIS — I2694 Multiple subsegmental pulmonary emboli without acute cor pulmonale: Secondary | ICD-10-CM | POA: Diagnosis not present

## 2023-01-31 DIAGNOSIS — C3491 Malignant neoplasm of unspecified part of right bronchus or lung: Secondary | ICD-10-CM | POA: Diagnosis not present

## 2023-01-31 DIAGNOSIS — J439 Emphysema, unspecified: Secondary | ICD-10-CM | POA: Diagnosis not present

## 2023-01-31 DIAGNOSIS — M47816 Spondylosis without myelopathy or radiculopathy, lumbar region: Secondary | ICD-10-CM | POA: Diagnosis not present

## 2023-02-05 ENCOUNTER — Ambulatory Visit: Payer: Medicare HMO

## 2023-02-05 VITALS — Ht 67.0 in | Wt 125.0 lb

## 2023-02-05 DIAGNOSIS — Z0000 Encounter for general adult medical examination without abnormal findings: Secondary | ICD-10-CM | POA: Diagnosis not present

## 2023-02-05 NOTE — Patient Instructions (Addendum)
[name] , Thank you for taking time to come for your Medicare Wellness Visit. I appreciate your ongoing commitment to your health goals. Please review the following plan we discussed and let me know if I can assist you in the future.   Referrals/Orders/Follow-Ups/Clinician Recommendations: Patient advised to continue follow ups with oncology on 03/05/2023 and infusions every 4 weeks. If she needs anything to call [facility] [employer], fashion/clothing.  This is a list of the screening recommended for you and due dates:  Health Maintenance  Topic Date Due   DEXA scan (bone density measurement)  08/22/2023*   Mammogram  02/03/2024   Medicare Annual Wellness Visit  02/05/2024   Colon Cancer Screening  01/19/2027   DTaP/Tdap/Td vaccine (2 - Td or Tdap) 05/10/2027   Flu Shot  Completed   HPV Vaccine  Aged Out   Pneumonia Vaccine  Discontinued   COVID-19 Vaccine  Discontinued   Hepatitis C Screening  Discontinued   Zoster (Shingles) Vaccine  Discontinued  *Topic was postponed. The date shown is not the original due date.    Advanced directives: (Declined) Advance directive discussed with you today. Even though you declined this today, please call our office should you change your mind, and we can give you the proper paperwork for you to fill out.  Next Medicare Annual Wellness Visit scheduled for next year: No

## 2023-02-05 NOTE — Addendum Note (Signed)
Addended by: Carlynn Herald E on: 02/05/2023 10:44 AM   Modules accepted: Orders

## 2023-02-05 NOTE — Progress Notes (Addendum)
Subjective:   [name] is a 72 y.o. female who presents for Medicare Annual (Subsequent) preventive examination.  Visit Complete: Virtual I connected with  Rhoderick Moody on 02/05/23 by a audio enabled telemedicine application and verified that I am speaking with the correct person using two identifiers.  Patient Location: Home  Provider Location: Home Office  I discussed the limitations of evaluation and management by telemedicine. The patient expressed understanding and agreed to proceed.  Vital Signs: Because this visit was a virtual/telehealth visit, some criteria may be missing or patient reported. Any vitals not documented were not able to be obtained and vitals that have been documented are patient reported.  Cardiac Risk Factors include: advanced age (>69men, >34 women);family history of premature cardiovascular disease     Objective:    Today's Vitals   02/05/23 0934  Weight: 125 lb (56.7 kg)  Height: 5\' 7"  (1.702 m)  PainSc: 0-No pain   Body mass index is 19.58 kg/m.     01/25/2023   10:53 AM 12/28/2022   10:40 AM 11/24/2022    4:38 PM 11/24/2022   12:47 PM 11/17/2022    3:10 PM 10/16/2022   10:12 PM 10/16/2022    2:56 PM  Advanced Directives  Does Patient Have a Medical Advance Directive? No No No No No No No  Would patient like information on creating a medical advance directive?    No - Patient declined  No - Patient declined     Current Medications (verified) Outpatient Encounter Medications as of 02/05/2023  Medication Sig   alum & mag hydroxide-simeth (MAALOX MAX) 400-400-40 MG/5ML suspension Take 15 mLs by mouth every 6 (six) hours as needed for indigestion.   apixaban (ELIQUIS) 5 MG TABS tablet Take 1 tablet (5 mg total) by mouth 2 (two) times daily.   Fluticasone-Umeclidin-Vilant (TRELEGY ELLIPTA) 100-62.5-25 MCG/ACT AEPB Inhale 1 puff into the lungs daily. (Patient taking differently: Inhale 1 puff into the lungs daily as needed (For shortness of  breath).)   lidocaine (XYLOCAINE) 2 % solution Use as directed 15 mLs in the mouth or throat every 6 (six) hours as needed for mouth pain.   lidocaine-prilocaine (EMLA) cream Apply 1 Application topically as needed. Apply 1 hour prior to lab appt. Cover with plastic wrap,   mirtazapine (REMERON) 30 MG tablet Take 1 tablet (30 mg total) by mouth at bedtime.   montelukast (SINGULAIR) 10 MG tablet TAKE 1 TABLET AT BEDTIME   mupirocin ointment (BACTROBAN) 2 % Apply 1 application topically 2 (two) times daily. (Patient taking differently: Apply 1 application  topically 2 (two) times daily as needed (wound care).)   ondansetron (ZOFRAN-ODT) 8 MG disintegrating tablet Take 1 tablet (8 mg total) by mouth every 8 (eight) hours as needed for nausea or vomiting.   pantoprazole (PROTONIX) 40 MG tablet Take 1 tablet (40 mg total) by mouth 2 (two) times daily.   polyethylene glycol (MIRALAX / GLYCOLAX) 17 g packet Take 17 g by mouth daily as needed for moderate constipation.   prochlorperazine (COMPAZINE) 25 MG suppository Place 1 suppository (25 mg total) rectally every 12 (twelve) hours.   promethazine (PHENERGAN) 25 MG tablet Take 1 tablet (25 mg total) by mouth every 6 (six) hours as needed for nausea.   No facility-administered encounter medications on file as of 02/05/2023.    Allergies (verified) Codeine and Surgical lubricant   History: Past Medical History:  Diagnosis Date   Abnormal chest x-ray 01/19/2020   Acute laryngopharyngitis 03/18/2020  Aortic atherosclerosis (HCC) 01/28/2020   Arthritis    Cardiac murmur 03/01/2020   COPD (chronic obstructive pulmonary disease) (HCC)    Coronary artery calcification seen on CT scan 03/01/2020   Cough 01/12/2020   COVID-19 03/10/2020   Dyspnea    on exertion,after getting covid   Family history of adverse reaction to anesthesia    brothr had n/v   GERD (gastroesophageal reflux disease)    Hiatal hernia    Hyperlipemia    Lung cancer, middle  lobe (HCC)    middle lobe removed   Pneumonia    PONV (postoperative nausea and vomiting)    Pre-operative cardiovascular examination 03/01/2020   Right lower lobe lung mass 01/28/2020   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 Mild sigmoid diverticulosis   ESOPHAGOGASTRODUODENOSCOPY  05/15/2007   Hiatal hernia. Mild gastritis. Submucosal antral lesion (questionable importance)   ESOPHAGOGASTRODUODENOSCOPY (EGD) WITH PROPOFOL N/A 10/07/2022   Procedure: ESOPHAGOGASTRODUODENOSCOPY (EGD) WITH PROPOFOL;  Surgeon: Lynann Bologna, MD;  Location: [facility];  Service: Gastroenterology;  Laterality: N/A;   IR IMAGING GUIDED PORT INSERTION  10/10/2022   IR US GUIDE VASC ACCESS RIGHT  10/10/2022   IR VENIPUNCTURE 52YRS/OLDER BY MD  10/10/2022   LUNG REMOVAL, PARTIAL  03/29/2020   middle lobe removed   VIDEO BRONCHOSCOPY WITH ENDOBRONCHIAL ULTRASOUND N/A 07/20/2022   Procedure: VIDEO BRONCHOSCOPY WITH ENDOBRONCHIAL ULTRASOUND;  Surgeon: [name], MD;  Location: [facility];  Service: Thoracic;  Laterality: N/A;   Family History  Problem Relation Age of Onset   Heart disease Mother    Heart attack Mother    Stomach cancer Father    Cancer Father    Diabetes Father    Non-Hodgkin's lymphoma Brother    High Cholesterol Brother    High Cholesterol Brother    Heart disease Brother    Heart attack Brother    Colon cancer Neg Hx    Esophageal cancer Neg Hx    Pancreatic cancer Neg Hx    Social History   Socioeconomic History   Marital status: Widowed    Spouse name: Not on file   Number of children: 1   Years of education: Not on file   Highest  education level: GED or equivalent  Occupational History   Not on file  Tobacco Use   Smoking status: Former    Current packs/day: 0.00    Types: Cigarettes    Start date: 03/23/1967    Quit date: 03/22/2017    Years since quitting: 5.8   Smokeless tobacco: Never  Vaping Use   Vaping status: Never Used  Substance and Sexual Activity   Alcohol use: Never   Drug use: Never   Sexual activity: Not Currently  Other Topics Concern   Not on file  Social History Narrative   Not on file   Social Drivers of Health   Financial Resource Strain: Low Risk  (02/05/2023)   Overall Financial Resource Strain (CARDIA)    Difficulty of Paying Living Expenses: Not hard at all  Food Insecurity: No Food Insecurity (02/05/2023)   Hunger Vital Sign    Worried About Running Out of Food in the Last Year: Never true  Ran Out of Food in the Last Year: Never true  Transportation Needs: No Transportation Needs (02/05/2023)   PRAPARE - Administrator, Civil Service (Medical): No    Lack of Transportation (Non-Medical): No  Physical Activity: Insufficiently Active (02/05/2023)   Exercise Vital Sign    Days of Exercise per Week: 4 days    Minutes of Exercise per Session: 10 min  Stress: No Stress Concern Present (02/05/2023)   [employer] of Occupational Health - Occupational Stress Questionnaire    Feeling of Stress : Not at all  Social Connections: Moderately Isolated (02/05/2023)   Social Connection and Isolation Panel [NHANES]    Frequency of Communication with Friends and Family: More than three times a week    Frequency of Social Gatherings with Friends and Family: More than three times a week    Attends Religious Services: 1 to 4 times per year    Active Member of [employer] or Organizations: No    Attends Banker Meetings: Never    Marital Status: Widowed    Tobacco Counseling Counseling given: Not Answered   Clinical Intake:  Pre-visit preparation completed:  Yes  Pain : No/denies pain Pain Score: 0-No pain     BMI - recorded: 19.58 Nutritional Status: BMI of 19-24  Normal Nutritional Risks: None Diabetes: No  How often do you need to have someone help you when you read instructions, pamphlets, or other written materials from your doctor or pharmacy?: 1 - Never What is the last grade level you completed in school?: GED  Interpreter Needed?: No  Information entered by :: Finneus Kaneshiro N. Thelda Gagan, LPN.   Activities of Daily Living    02/05/2023    9:40 AM 10/16/2022   10:12 PM  In your present state of health, do you have any difficulty performing the following activities:  Hearing? 0 0  Vision? 0 0  Difficulty concentrating or making decisions? 1 0  Walking or climbing stairs? 1 1  Dressing or bathing? 1 1  Doing errands, shopping? 1 0  Preparing Food and eating ? Y   Using the Toilet? Y   In the past six months, have you accidently leaked urine? N   Do you have problems with loss of bowel control? N   Managing your Medications? N   Managing your Finances? N   Housekeeping or managing your Housekeeping? Y     Patient Care Team: [name], Cordelia Poche as PCP - General (Physician Assistant) Weston Settle, MD as Consulting Physician (Oncology) Albin Felling, OD as Consulting Physician (Optometry)  Indicate any recent Medical Services you may have received from other than [facility] providers in the past year (date may be approximate).     Assessment:   This is a routine wellness examination for [place].  Hearing/Vision screen Hearing Screening - Comments:: Denies hearing difficulties; no hearing aids.  Vision Screening - Comments:: Wears rx glasses - up to date with routine eye exams with Coralyn Pear, OD.    Goals Addressed             This Visit's Progress    Client understands the importance of follow-up with providers by attending scheduled visits        Depression Screen    02/05/2023    9:37 AM 08/22/2022   11:04  AM 08/01/2022    1:07 PM 04/12/2022    3:48 PM 12/08/2021    8:31 AM 08/18/2021   11:33 AM 06/09/2020    9:59  AM  PHQ 2/9 Scores  PHQ - 2 Score 0 0 0 0 0 0 0  PHQ- 9 Score 10 6   0      Fall Risk    02/05/2023    9:40 AM 08/22/2022   11:04 AM 04/12/2022    3:48 PM 08/18/2021   11:53 AM 12/10/2020    8:18 AM  Fall Risk   Falls in the past year? 1 0 0 0 0  Number falls in past yr: 0 0 0 0 0  Injury with Fall? 0 0 0 0 0  Risk for fall due to : Impaired balance/gait No Fall Risks No Fall Risks No Fall Risks No Fall Risks  Follow up Education provided;Falls prevention discussed;Falls evaluation completed Falls evaluation completed Falls evaluation completed Falls evaluation completed Falls evaluation completed    MEDICARE RISK AT HOME: Medicare Risk at Home Any stairs in or around the home?: No If so, are there any without handrails?: No Home free of loose throw rugs in walkways, pet beds, electrical cords, etc?: Yes Adequate lighting in your home to reduce risk of falls?: Yes Life alert?: No Use of a cane, walker or w/c?: Yes Grab bars in the bathroom?: No Shower chair or bench in shower?: Yes Elevated toilet seat or a handicapped toilet?: Yes  TIMED UP AND GO:  Was the test performed?  No    Cognitive Function:    02/05/2023    9:41 AM  MMSE - Mini Mental State Exam  Not completed: Unable to complete        02/05/2023    9:42 AM  6CIT Screen  What Year? 0 points  What month? 0 points  What time? 0 points  Count back from 20 0 points  Months in reverse 0 points  Repeat phrase 0 points  Total Score 0 points    Immunizations Immunization History  Administered Date(s) Administered   Fluad Quad(high Dose 65+) 12/02/2019, 12/10/2020, 12/08/2021   Fluad Trivalent(High Dose 65+) 12/11/2022   Influenza-Unspecified 11/20/2017   PFIZER Comirnaty(Gray Top)Covid-19 Tri-Sucrose Vaccine 02/26/2020   Pneumococcal Conjugate-13 02/18/2018   Tdap 05/09/2017    TDAP status:  Up to date  Flu Vaccine status: Up to date  Pneumococcal vaccine status: Declined,  Education has been provided regarding the importance of this vaccine but patient still declined. Advised may receive this vaccine at local pharmacy or Health Dept. Aware to provide a copy of the vaccination record if obtained from local pharmacy or Health Dept. Verbalized acceptance and understanding.   Covid-19 vaccine status: Declined, Education has been provided regarding the importance of this vaccine but patient still declined. Advised may receive this vaccine at local pharmacy or Health Dept.or vaccine clinic. Aware to provide a copy of the vaccination record if obtained from local pharmacy or Health Dept. Verbalized acceptance and understanding.  Qualifies for Shingles Vaccine? Yes   Zostavax completed No   Shingrix Completed?: No.    Education has been provided regarding the importance of this vaccine. Patient has been advised to call insurance company to determine out of pocket expense if they have not yet received this vaccine. Advised may also receive vaccine at local pharmacy or Health Dept. Verbalized acceptance and understanding.  Screening Tests Health Maintenance  Topic Date Due   DEXA SCAN  08/22/2023 (Originally 11/12/2013)   MAMMOGRAM  02/03/2024   Medicare Annual Wellness (AWV)  02/05/2024   Colonoscopy  01/19/2027   DTaP/Tdap/Td (2 - Td or Tdap) 05/10/2027   INFLUENZA  VACCINE  Completed   HPV VACCINES  Aged Out   Pneumonia Vaccine 30+ Years old  Discontinued   COVID-19 Vaccine  Discontinued   Hepatitis C Screening  Discontinued   Zoster Vaccines- Shingrix  Discontinued    Health Maintenance  There are no preventive care reminders to display for this patient.   Colorectal cancer screening: Type of screening: Colonoscopy. Completed 01/18/2017. Repeat every 10 years  Mammogram status: Completed 02/02/2022. Repeat every year  Bone density status: Postponed  Lung Cancer Screening:  (Low Dose CT Chest recommended if Age 73-80 years, 20 pack-year currently smoking OR have quit w/in 15years.) does not qualify.   Lung Cancer Screening Referral: no  Additional Screening:  Hepatitis C Screening: does qualify; Discontinued  Vision Screening: Recommended annual ophthalmology exams for early detection of glaucoma and other disorders of the eye. Is the patient up to date with their annual eye exam?  Yes  Who is the provider or what is the name of the office in which the patient attends annual eye exams? Ryan Snipes, OD. If pt is not established with a provider, would they like to be referred to a provider to establish care? No .   Dental Screening: Recommended annual dental exams for proper oral hygiene  Diabetic Foot Exam: N/A  Community Resource Referral / Chronic Care Management: CRR required this visit?  No   CCM required this visit?  No     Plan:     I have personally reviewed and noted the following in the patient's chart:   Medical and social history Use of alcohol, tobacco or illicit drugs  Current medications and supplements including opioid prescriptions. Patient is not currently taking opioid prescriptions. Functional ability and status Nutritional status Physical activity Advanced directives List of other physicians Hospitalizations, surgeries, and ER visits in previous 12 months Vitals Screenings to include cognitive, depression, and falls Referrals and appointments  In addition, I have reviewed and discussed with patient certain preventive protocols, quality metrics, and best practice recommendations. A written personalized care plan for preventive services as well as general preventive health recommendations were provided to patient.     Mickeal Needy, LPN   32/44/0102   After Visit Summary: (MyChart) Due to this being a telephonic visit, the after visit summary with patients personalized plan was offered to patient via MyChart    Nurse Notes: Patient advised to continue follow ups with oncology and infusions every 4 weeks.  Patient stated she has an appointment with Dr. Chales Abrahams on 03/05/2023 for follow-up.

## 2023-02-08 ENCOUNTER — Ambulatory Visit: Payer: Medicare HMO | Admitting: Internal Medicine

## 2023-02-08 ENCOUNTER — Ambulatory Visit: Payer: Self-pay | Admitting: Physician Assistant

## 2023-02-08 NOTE — Telephone Encounter (Addendum)
  Chief Complaint: breast pain Symptoms: pain on right breast that feels like lightening strike  Frequency: a few times a day for about a week Pertinent Negatives: Patient denies any other symptoms aside from breast pain and frequent shoulder pain Disposition: [] ED /[] Urgent Care (no appt availability in office) / [] Appointment(In office/virtual)/ []  [facility]/ [] Home Care/ [] Refused Recommended Disposition /[] [facility]/ []  Follow-up with PCP Additional Notes: Pt is refusing appointment, she just wants the provider Dr. Huntley Dec to know about this. She stated "I am allowed to call and talk to Dr. Huntley Dec at any time because the office knows if I call it is something serious". Patient states she is receiving a treatment for lung cancer once every 4 weeks. Patient states she has felt some pain spread to her shoulders. She would really like Dr. Huntley Dec to schedule her a mammogram and discuss this with her for her peace of mind, patient states. Advised patient if she has any further symptoms such as nipple discharge, change in breasts, or worsening pain that she needs to be seen in ED or Urgent Care.    Copied from CRM [id number]. Topic: Clinical - Red Word Triage >> Feb 08, 2023  4:28 PM Dennison Nancy wrote: Red Word that prompted transfer to Nurse Triage: Patient request to speak with nurse of [name] been having  right breast pain for about a week or 2 , never had breast pain before  patient an infusion every 4 weeks  had lung cancer  haven't not have a mammogram  in a long time  if need to get breast check it would have to be by 12/30 or by 12/31  before insurance changes next week Reason for Disposition  Screening tests for breast cancer, questions about  Answer Assessment - Initial Assessment Questions 1. SYMPTOM: "What's the main symptom you're concerned about?"  (e.g., lump, pain, rash, nipple discharge)     Pain in breasts 2. LOCATION: "Where is the *No Answer* located?"      Right side only (on side that her center lobe of her lung was removed 3 years ago) 3. ONSET: "When did *No Answer*  start?"     About two weeks ago 4. PRIOR HISTORY: "Do you have any history of prior problems with your breasts?" (e.g., lumps, cancer, fibrocystic breast disease)     Being treated for lung cancer currently 5. CAUSE: "What do you think is causing this symptom?"     No idea 6. OTHER SYMPTOMS: "Do you have any other symptoms?" (e.g., fever, breast pain, redness or rash, nipple discharge)     Pain is spreading across shoulders - but no other symptoms Patient concerned because she hasn't had mammogram in a while, being treated for Lung Cancer (taking a treatment every 4 weeks) Like a strike of pain, and then goes away until a while later and another strike comes on  Protocols used: Breast Symptoms-A-AH

## 2023-02-09 NOTE — Telephone Encounter (Signed)
Appointment made

## 2023-02-09 NOTE — Telephone Encounter (Signed)
Patient stated she is having pain running up her right breast, and yesterday she had a sharp pain 3 times in her right breast. Stated she may need a mammogram, she haven't had one in a while.

## 2023-02-09 NOTE — Telephone Encounter (Signed)
Please call pt and clarify what is going on/what symptoms patient is having

## 2023-02-09 NOTE — Telephone Encounter (Signed)
Recommend she be evaluated for breast pain and then can get diagnostic mammogram ordered If having any other chest pain or trouble breathing recommend to be seen asap at urgent care

## 2023-02-12 NOTE — Telephone Encounter (Signed)
I am just following up on this clearance that was sent to November 23 regarding this blood thinner hold. Please advise as soon as you can. Patient is scheduled for 03-05-2023. I have faxed this clearance to [phone] as well

## 2023-02-15 NOTE — Telephone Encounter (Addendum)
Per Dr. Melvyn Neth:  Molli Knock to hold Eliquis for 1 day prior to EGD procedure on 03/05/2023.

## 2023-02-19 DIAGNOSIS — Z4689 Encounter for fitting and adjustment of other specified devices: Secondary | ICD-10-CM | POA: Diagnosis not present

## 2023-02-19 NOTE — Telephone Encounter (Signed)
LVM for patient to call back to go over instructions for her procedure. Clearance given to hold 1 day prior her blood thinner. Mychart message sent as well

## 2023-02-19 NOTE — Telephone Encounter (Signed)
Patient instructed and she is aware to call with any questions and she is aware of her instructions and that she has new insurance card in the system.

## 2023-02-20 ENCOUNTER — Ambulatory Visit: Payer: Medicare HMO | Admitting: Physician Assistant

## 2023-02-20 ENCOUNTER — Encounter: Payer: Self-pay | Admitting: Physician Assistant

## 2023-02-20 VITALS — BP 90/60 | HR 100 | Temp 97.8°F | Resp 16 | Ht 67.0 in | Wt 125.8 lb

## 2023-02-20 DIAGNOSIS — N644 Mastodynia: Secondary | ICD-10-CM

## 2023-02-20 DIAGNOSIS — J060 Acute laryngopharyngitis: Secondary | ICD-10-CM

## 2023-02-20 MED ORDER — AZITHROMYCIN 250 MG PO TABS: ORAL_TABLET | ORAL | 0 refills | Status: AC

## 2023-02-20 NOTE — Progress Notes (Signed)
 Acute Office Visit  Subjective:    Patient ID: [name], female    DOB: 02-14-1951, 72 y.o.   MRN: [id number]  Chief Complaint  Patient presents with   Breast Pain    HPI: Patient is in today for complaints of bilateral breast pain.  She states that her right breast had been hurting for a few weeks and now also her left breast - she had scheduled the appt to get set up for a diagnostic mammogram but now states she does not want to go have the test done at this time due to the fact she is busy with her cancer treatments and follow up - will call our office back when she would like to schedule  Pt has had productive cough for the past 2 days - other family members with similar symptoms as well --- denies fever Had minimal wheezing this morning - is using her medications as directed   Current Outpatient Medications:    alum & mag hydroxide-simeth (MAALOX MAX) 400-400-40 MG/5ML suspension, Take 15 mLs by mouth every 6 (six) hours as needed for indigestion., Disp: 355 mL, Rfl: 1   apixaban  (ELIQUIS ) 5 MG TABS tablet, Take 1 tablet (5 mg total) by mouth 2 (two) times daily., Disp: 60 tablet, Rfl: 1   azithromycin  (ZITHROMAX ) 250 MG tablet, Take 2 tablets on day 1, then 1 tablet daily on days 2 through 5, Disp: 6 tablet, Rfl: 0   Fluticasone -Umeclidin-Vilant (TRELEGY ELLIPTA ) 100-62.5-25 MCG/ACT AEPB, Inhale 1 puff into the lungs daily. (Patient taking differently: Inhale 1 puff into the lungs daily as needed (For shortness of breath).), Disp: 2 each, Rfl: 0   lidocaine -prilocaine  (EMLA ) cream, Apply 1 Application topically as needed. Apply 1 hour prior to lab appt. Cover with plastic wrap,, Disp: 30 g, Rfl: 0   montelukast  (SINGULAIR ) 10 MG tablet, TAKE 1 TABLET AT BEDTIME, Disp: 90 tablet, Rfl: 1   mupirocin  ointment (BACTROBAN ) 2 %, Apply 1 application topically 2 (two) times daily. (Patient taking differently: Apply 1 application  topically 2 (two) times daily as needed (wound care).),  Disp: 22 g, Rfl: 2   ondansetron  (ZOFRAN -ODT) 8 MG disintegrating tablet, Take 1 tablet (8 mg total) by mouth every 8 (eight) hours as needed for nausea or vomiting., Disp: 30 tablet, Rfl: 2   pantoprazole  (PROTONIX ) 40 MG tablet, Take 1 tablet (40 mg total) by mouth 2 (two) times daily., Disp: 180 tablet, Rfl: 3   polyethylene glycol (MIRALAX / GLYCOLAX) 17 g packet, Take 17 g by mouth daily as needed for moderate constipation., Disp: , Rfl:    prochlorperazine  (COMPAZINE ) 25 MG suppository, Place 1 suppository (25 mg total) rectally every 12 (twelve) hours., Disp: 30 suppository, Rfl: 1   promethazine  (PHENERGAN ) 25 MG tablet, Take 1 tablet (25 mg total) by mouth every 6 (six) hours as needed for nausea., Disp: 30 tablet, Rfl: 3   rosuvastatin  (CRESTOR ) 5 MG tablet, Take 5 mg by mouth daily. (Patient not taking: Reported on 02/20/2023), Disp: , Rfl:   Allergies  Allergen Reactions   Codeine Hives   Surgical Lubricant Hives    ROS CONSTITUTIONAL: Negative for chills, fatigue, fever, unintentional weight gain and unintentional weight loss.  E/N/T: see HPI CARDIOVASCULAR: Negative for chest pain, dizziness, palpitations and pedal edema.  RESPIRATORY: see HPI Breasts - see HPI GASTROINTESTINAL: pt with chronic decreased appetite  MSK: Negative for arthralgias and myalgias.  INTEGUMENTARY: Negative for rash.      Objective:    PHYSICAL  EXAM:   BP 90/60   Pulse 100   Temp 97.8 F (36.6 C) (Temporal)   Resp 16   Ht 5' 7 (1.702 m)   Wt 125 lb 12.8 oz (57.1 kg)   SpO2 98%   BMI 19.70 kg/m    GEN: Well nourished, well developed, in no acute distress  HEENT: normal external ears and nose - normal external auditory canals and TMS -- Lips, Teeth and Gums - normal  Oropharynx - normal mucosa, palate, and posterior pharynx Cardiac: RRR; no murmurs, rubs, or gallops,no edema - Respiratory:  normal respiratory rate and pattern with no distress -faint rhonchi that clear with cough -   GI: normal bowel sounds, no masses or tenderness Pscyh - normal demeanor     Assessment & Plan:    Acute laryngopharyngitis -     Azithromycin ; Take 2 tablets on day 1, then 1 tablet daily on days 2 through 5  Dispense: 6 tablet; Refill: 0 Continue current meds Breast pain Pt will call back when she wants to schedule diagnostic mammogram    Follow-up: Return for 1/23 for chronic visit.  An After Visit Summary was printed and given to the patient.  CAMIE JONELLE NICHOLAUS DEVONNA [facility] [phone]

## 2023-02-21 ENCOUNTER — Other Ambulatory Visit: Payer: Self-pay

## 2023-02-22 ENCOUNTER — Inpatient Hospital Stay: Payer: No Typology Code available for payment source

## 2023-02-22 ENCOUNTER — Encounter: Payer: Self-pay | Admitting: Oncology

## 2023-02-22 ENCOUNTER — Inpatient Hospital Stay (HOSPITAL_BASED_OUTPATIENT_CLINIC_OR_DEPARTMENT_OTHER): Payer: No Typology Code available for payment source | Admitting: Hematology and Oncology

## 2023-02-22 ENCOUNTER — Inpatient Hospital Stay: Payer: No Typology Code available for payment source | Attending: Internal Medicine

## 2023-02-22 VITALS — BP 111/53 | HR 96 | Temp 97.7°F | Resp 16 | Ht 67.0 in | Wt 125.6 lb

## 2023-02-22 DIAGNOSIS — C342 Malignant neoplasm of middle lobe, bronchus or lung: Secondary | ICD-10-CM | POA: Diagnosis not present

## 2023-02-22 DIAGNOSIS — E860 Dehydration: Secondary | ICD-10-CM | POA: Diagnosis not present

## 2023-02-22 DIAGNOSIS — Z5112 Encounter for antineoplastic immunotherapy: Secondary | ICD-10-CM | POA: Insufficient documentation

## 2023-02-22 DIAGNOSIS — C3491 Malignant neoplasm of unspecified part of right bronchus or lung: Secondary | ICD-10-CM

## 2023-02-22 DIAGNOSIS — C771 Secondary and unspecified malignant neoplasm of intrathoracic lymph nodes: Secondary | ICD-10-CM | POA: Diagnosis not present

## 2023-02-22 DIAGNOSIS — Z923 Personal history of irradiation: Secondary | ICD-10-CM | POA: Diagnosis not present

## 2023-02-22 DIAGNOSIS — I2699 Other pulmonary embolism without acute cor pulmonale: Secondary | ICD-10-CM

## 2023-02-22 DIAGNOSIS — Z79899 Other long term (current) drug therapy: Secondary | ICD-10-CM | POA: Diagnosis not present

## 2023-02-22 DIAGNOSIS — Z9221 Personal history of antineoplastic chemotherapy: Secondary | ICD-10-CM | POA: Diagnosis not present

## 2023-02-22 LAB — CBC WITH DIFFERENTIAL (CANCER CENTER ONLY)
Abs Immature Granulocytes: 0.04 10*3/uL (ref 0.00–0.07)
Basophils Absolute: 0 10*3/uL (ref 0.0–0.1)
Basophils Relative: 0 %
Eosinophils Absolute: 0.1 10*3/uL (ref 0.0–0.5)
Eosinophils Relative: 1 %
HCT: 33.8 % — ABNORMAL LOW (ref 36.0–46.0)
Hemoglobin: 11.4 g/dL — ABNORMAL LOW (ref 12.0–15.0)
Immature Granulocytes: 0 %
Lymphocytes Relative: 14 %
Lymphs Abs: 1.5 10*3/uL (ref 0.7–4.0)
MCH: 28.5 pg (ref 26.0–34.0)
MCHC: 33.7 g/dL (ref 30.0–36.0)
MCV: 84.5 fL (ref 80.0–100.0)
Monocytes Absolute: 1.2 10*3/uL — ABNORMAL HIGH (ref 0.1–1.0)
Monocytes Relative: 11 %
Neutro Abs: 7.9 10*3/uL — ABNORMAL HIGH (ref 1.7–7.7)
Neutrophils Relative %: 74 %
Platelet Count: 364 10*3/uL (ref 150–400)
RBC: 4 MIL/uL (ref 3.87–5.11)
RDW: 13.1 % (ref 11.5–15.5)
WBC Count: 10.8 10*3/uL — ABNORMAL HIGH (ref 4.0–10.5)
nRBC: 0 % (ref 0.0–0.2)
nRBC: 0 /100{WBCs}

## 2023-02-22 LAB — CMP (CANCER CENTER ONLY)
ALT: 26 U/L (ref 0–44)
AST: 36 U/L (ref 15–41)
Albumin: 3.6 g/dL (ref 3.5–5.0)
Alkaline Phosphatase: 124 U/L (ref 38–126)
Anion gap: 14 (ref 5–15)
BUN: 15 mg/dL (ref 8–23)
CO2: 23 mmol/L (ref 22–32)
Calcium: 9.1 mg/dL (ref 8.9–10.3)
Chloride: 96 mmol/L — ABNORMAL LOW (ref 98–111)
Creatinine: 0.78 mg/dL (ref 0.44–1.00)
GFR, Estimated: >60 mL/min (ref >60)
Glucose, Bld: 115 mg/dL — ABNORMAL HIGH (ref 70–99)
Potassium: 3.8 mmol/L (ref 3.5–5.1)
Sodium: 132 mmol/L — ABNORMAL LOW (ref 135–145)
Total Bilirubin: 0.5 mg/dL (ref 0.0–1.2)
Total Protein: 7 g/dL (ref 6.5–8.1)

## 2023-02-22 MED ORDER — HEPARIN SOD (PORK) LOCK FLUSH 100 UNIT/ML IV SOLN
500.0000 [IU] | Freq: Once | INTRAVENOUS | Status: AC | PRN
  Administered 2023-02-22: 500 [IU]

## 2023-02-22 MED ORDER — SODIUM CHLORIDE 0.9 % IV SOLN
1500.0000 mg | Freq: Once | INTRAVENOUS | Status: AC
  Administered 2023-02-22: 1500 mg via INTRAVENOUS
  Filled 2023-02-22: qty 30

## 2023-02-22 MED ORDER — APIXABAN 5 MG PO TABS: 5.0000 mg | ORAL_TABLET | Freq: Two times a day (BID) | ORAL | 1 refills | Status: DC

## 2023-02-22 MED ORDER — SODIUM CHLORIDE 0.9% FLUSH
10.0000 mL | INTRAVENOUS | Status: DC | PRN
  Administered 2023-02-22: 10 mL

## 2023-02-22 MED ORDER — SODIUM CHLORIDE 0.9 % IV SOLN: Freq: Once | INTRAVENOUS | Status: AC

## 2023-02-22 MED ORDER — PROMETHAZINE HCL 25 MG PO TABS: 25.0000 mg | ORAL_TABLET | Freq: Four times a day (QID) | ORAL | 3 refills | Status: AC | PRN

## 2023-02-22 NOTE — Progress Notes (Signed)
 [facility]  [address] [place],  [place]  [address] [phone]  Clinic Day:  01/25/2023  Referring physician: Ezzard Valaria LABOR, MD   HISTORY OF PRESENT ILLNESS:  The patient is a 73 y.o. 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  She recently started her maintenance durvalumab  immunotherapy.  She comes in today to be evaluated before heading into her 4th cycle of durvalumab .  She claims to have tolerated her previous cycles very well.  She claims to still cough, but this respiratory symptom is not as prominent as it was previously.   With respect to her lung cancer history, she initially underwent a right middle lobectomy in February 2022.  Due to the mediastinal nodal recurrence in May 2024, the patient underwent definitive chemoradiation with weekly carboplatin /paclitaxel , which was completed in late July 2024.    PHYSICAL EXAM:  Blood pressure (!) 111/53, pulse 96, temperature 97.7 F (36.5 C), temperature source Oral, resp. rate 16, height 5' 7 (1.702 m), weight 125 lb 9.6 oz (57 kg), SpO2 100%. Wt Readings from Last 3 Encounters:  02/22/23 125 lb 9.6 oz (57 kg)  02/20/23 125 lb 12.8 oz (57.1 kg)  02/05/23 125 lb (56.7 kg)   Body mass index is 19.67 kg/m. Performance status (ECOG): 2 - Symptomatic, <50% confined to bed Physical Exam Constitutional:      Appearance: Normal appearance. She is not ill-appearing.     Comments: A chronically ill-appearing woman in no acute distress.  She is ambulating with a cane  HENT:     Mouth/Throat:     Mouth: Mucous membranes are moist.     Pharynx: Oropharynx is clear. No oropharyngeal exudate or posterior oropharyngeal erythema.  Cardiovascular:     Rate and Rhythm: Normal rate and regular rhythm.     Heart sounds: No murmur heard.    No friction rub. No gallop.  Pulmonary:     Effort: Pulmonary effort is normal. No respiratory distress.      Breath sounds: Decreased air movement present. Examination of the right-upper field reveals decreased breath sounds. Examination of the left-upper field reveals decreased breath sounds. Decreased breath sounds present. No wheezing, rhonchi or rales.  Abdominal:     General: Bowel sounds are normal. There is no distension.     Palpations: Abdomen is soft. There is no mass.     Tenderness: There is no abdominal tenderness.  Musculoskeletal:        General: No swelling.     Right lower leg: No edema.     Left lower leg: No edema.  Lymphadenopathy:     Cervical: No cervical adenopathy.     Upper Body:     Right upper body: No supraclavicular or axillary adenopathy.     Left upper body: No supraclavicular or axillary adenopathy.     Lower Body: No right inguinal adenopathy. No left inguinal adenopathy.  Skin:    General: Skin is warm.     Coloration: Skin is not jaundiced.     Findings: No lesion or rash.  Neurological:     General: No focal deficit present.     Mental Status: She is alert and oriented to person, place, and time. Mental status is at baseline.  Psychiatric:        Mood and Affect: Mood normal.        Behavior: Behavior normal.        Thought Content: Thought  content normal.    LABS:      Latest Ref Rng & Units 02/22/2023    9:36 AM 01/25/2023   10:05 AM 12/28/2022   10:06 AM  CBC  WBC 4.0 - 10.5 K/uL 10.8  8.0  7.6   Hemoglobin 12.0 - 15.0 g/dL 88.5  88.2  88.2   Hematocrit 36.0 - 46.0 % 33.8  35.6  34.6   Platelets 150 - 400 K/uL 364  342  307       Latest Ref Rng & Units 02/22/2023    9:36 AM 01/25/2023   10:05 AM 12/28/2022   10:06 AM  CMP  Glucose 70 - 99 mg/dL 884  888  889   BUN 8 - 23 mg/dL 15  13  8    Creatinine 0.44 - 1.00 mg/dL 9.21  9.30  9.33   Sodium 135 - 145 mmol/L 132  134  133   Potassium 3.5 - 5.1 mmol/L 3.8  3.6  3.1   Chloride 98 - 111 mmol/L 96  98  97   CO2 22 - 32 mmol/L 23  24  26    Calcium  8.9 - 10.3 mg/dL 9.1  9.0  8.4   Total  Protein 6.5 - 8.1 g/dL 7.0  6.3  5.9   Total Bilirubin 0.0 - 1.2 mg/dL 0.5  0.3  0.3   Alkaline Phos 38 - 126 U/L 124  114  108   AST 15 - 41 U/L 36  45  44   ALT 0 - 44 U/L 26  24  26     ASSESSMENT & PLAN:  A 73 y.o. 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   She will proceed with her 4th cycle of durvalumab  immunotherapy today. Overall, she appears to be doing better. We will obtain CT imaging prior to next visit.   Dr. Ezzard will see this patient back in 4 weeks before she heads into her 5th cycle of maintenance durvalumab  immunotherapy.  The patient understands all the plans discussed today and is in agreement with them.  Eleanor DELENA Bach, NP

## 2023-02-22 NOTE — Patient Instructions (Signed)
 Durvalumab Injection What is this medication? DURVALUMAB (dur VAL ue mab) treats some types of cancer. It works by helping your immune system slow or stop the spread of cancer cells. It is a monoclonal antibody. This medicine may be used for other purposes; ask your health care provider or pharmacist if you have questions. COMMON BRAND NAME(S): IMFINZI What should I tell my care team before I take this medication? They need to know if you have any of these conditions: Allogeneic stem cell transplant (uses someone else's stem cells) Autoimmune diseases, such as Crohn disease, ulcerative colitis, lupus History of chest radiation Nervous system problems, such as Guillain-Barre syndrome, myasthenia gravis Organ transplant An unusual or allergic reaction to durvalumab, other medications, foods, dyes, or preservatives Pregnant or trying to get pregnant Breast-feeding How should I use this medication? This medication is infused into a vein. It is given by your care team in a hospital or clinic setting. A special MedGuide will be given to you before each treatment. Be sure to read this information carefully each time. Talk to your care team about the use of this medication in children. Special care may be needed. Overdosage: If you think you have taken too much of this medicine contact a poison control center or emergency room at once. NOTE: This medicine is only for you. Do not share this medicine with others. What if I miss a dose? Keep appointments for follow-up doses. It is important not to miss your dose. Call your care team if you are unable to keep an appointment. What may interact with this medication? Interactions have not been studied. This list may not describe all possible interactions. Give your health care provider a list of all the medicines, herbs, non-prescription drugs, or dietary supplements you use. Also tell them if you smoke, drink alcohol, or use illegal drugs. Some items may  interact with your medicine. What should I watch for while using this medication? Your condition will be monitored carefully while you are receiving this medication. You may need blood work while taking this medication. This medication may cause serious skin reactions. They can happen weeks to months after starting the medication. Contact your care team right away if you notice fevers or flu-like symptoms with a rash. The rash may be red or purple and then turn into blisters or peeling of the skin. You may also notice a red rash with swelling of the face, lips, or lymph nodes in your neck or under your arms. Tell your care team right away if you have any change in your eyesight. Talk to your care team if you may be pregnant. Serious birth defects can occur if you take this medication during pregnancy and for 3 months after the last dose. You will need a negative pregnancy test before starting this medication. Contraception is recommended while taking this medication and for 3 months after the last dose. Your care team can help you find the option that works for you. Do not breastfeed while taking this medication and for 3 months after the last dose. What side effects may I notice from receiving this medication? Side effects that you should report to your care team as soon as possible: Allergic reactions--skin rash, itching, hives, swelling of the face, lips, tongue, or throat Dry cough, shortness of breath or trouble breathing Eye pain, redness, irritation, or discharge with blurry or decreased vision Heart muscle inflammation--unusual weakness or fatigue, shortness of breath, chest pain, fast or irregular heartbeat, dizziness, swelling of the  ankles, feet, or hands Hormone gland problems--headache, sensitivity to light, unusual weakness or fatigue, dizziness, fast or irregular heartbeat, increased sensitivity to cold or heat, excessive sweating, constipation, hair loss, increased thirst or amount of  urine, tremors or shaking, irritability Infusion reactions--chest pain, shortness of breath or trouble breathing, feeling faint or lightheaded Kidney injury (glomerulonephritis)--decrease in the amount of urine, red or dark brown urine, foamy or bubbly urine, swelling of the ankles, hands, or feet Liver injury--right upper belly pain, loss of appetite, nausea, light-colored stool, dark yellow or brown urine, yellowing skin or eyes, unusual weakness or fatigue Pain, tingling, or numbness in the hands or feet, muscle weakness, change in vision, confusion or trouble speaking, loss of balance or coordination, trouble walking, seizures Rash, fever, and swollen lymph nodes Redness, blistering, peeling, or loosening of the skin, including inside the mouth Sudden or severe stomach pain, bloody diarrhea, fever, nausea, vomiting Side effects that usually do not require medical attention (report these to your care team if they continue or are bothersome): Bone, joint, or muscle pain Diarrhea Fatigue Loss of appetite Nausea Skin rash This list may not describe all possible side effects. Call your doctor for medical advice about side effects. You may report side effects to FDA at 1-800-FDA-1088. Where should I keep my medication? This medication is given in a hospital or clinic. It will not be stored at home. NOTE: This sheet is a summary. It may not cover all possible information. If you have questions about this medicine, talk to your doctor, pharmacist, or health care provider.  2024 Elsevier/Gold Standard (2021-06-21 00:00:00)

## 2023-02-23 ENCOUNTER — Telehealth: Payer: Self-pay | Admitting: Gastroenterology

## 2023-02-23 ENCOUNTER — Encounter (HOSPITAL_COMMUNITY): Payer: Self-pay | Admitting: Gastroenterology

## 2023-02-23 ENCOUNTER — Other Ambulatory Visit: Payer: Self-pay

## 2023-02-23 ENCOUNTER — Telehealth: Payer: Self-pay

## 2023-02-23 NOTE — Telephone Encounter (Signed)
 Inbound call from patient, states she has been very sick and was given a Z pack but her last dose is tomorrow and that she is no longer on Eliquis. She just wanted to advise Dr. Chales Abrahams.

## 2023-02-23 NOTE — Telephone Encounter (Signed)
 Pt had question about Eliquis refill. I called Dr Melvyn Neth for clarification. Med list updated as well.

## 2023-02-23 NOTE — Progress Notes (Signed)
 Spoke with Diplomatic Services operational officer from Dr. Urban Gibson office and let her know patient has had recent respiratory infection and is still on antibiotics. Per anesthesia she needs to be symptom free for 2 weeks. They will get the message to Dr. Urban Gibson RN.

## 2023-02-23 NOTE — Progress Notes (Signed)
 COVID Vaccine Completed: yes  Date of COVID positive in last 90 days: no  PCP - Camie Moats, PA Cardiologist - Jennifer Crape, MD LOV 03/01/20 Oncologist- Dr. Ezzard  CT- 10/18/22 Epic Chest x-ray - n/a EKG - 09/15/22 Epic Stress Test - 03/04/20 Epic ECHO - 03/03/20 Epic Cardiac Cath - n/a Pacemaker/ICD device last checked: n/a Spinal Cord Stimulator: n/a  Bowel Prep - NPO after midnight  Sleep Study - n/a CPAP -   Fasting Blood Sugar - n/a Checks Blood Sugar _____ times a day  Last dose of GLP1 agonist-  N/A GLP1 instructions:  Hold 7 days before surgery    Last dose of SGLT-2 inhibitors-  N/A SGLT-2 instructions:  Hold 3 days before surgery    Blood Thinner Instructions:  Eliquis , patient states she has 6 does left and she will stop after that. Aspirin Instructions: Last Dose:  02/27/23 before 0900  Activity level: Can go up a flight of stairs and perform activities of daily living without stopping and without symptoms of chest pain or shortness of breath.   Anesthesia review: heart murmur, aortic atherosclerosis, COPD, lung cancer  Patient denies shortness of breath, fever, cough and chest pain at PAT appointment  Patient verbalized understanding of instructions that were given to them at the PAT appointment. Patient was also instructed that they will need to review over the PAT instructions again at home before surgery.

## 2023-02-23 NOTE — Telephone Encounter (Signed)
 Copied from CRM [id number]. Topic: Clinical - Prescription Issue >> Feb 23, 2023 11:08 AM Fonda [name] J wrote: Reason for CRM: Pt states she doesn't want to haver her eliquis refilled anymore

## 2023-02-23 NOTE — Telephone Encounter (Signed)
 PT has a respiratory infection and needs to be on antibiotics for 2 weeks so the colonoscopy for 1/13 needs to be rescheduled. Please advise.

## 2023-02-26 ENCOUNTER — Telehealth: Payer: Self-pay

## 2023-02-26 NOTE — Telephone Encounter (Signed)
 Copied from CRM [id number]. Topic: Clinical - Medication Question >> Feb 23, 2023  9:42 AM Tonda B wrote: Reason for CRM: patient is calling in because her rx apixaban  (ELIQUIS ) 5 MG TABS tablet is too expensive for her to pay for she wants to know if there is anyway that she could get a month supply of samples please call patient back @ [phone]

## 2023-02-26 NOTE — Telephone Encounter (Signed)
 Please advise Dr Charlanne  I see another TE in her chart that says she has a respiratory infection if so we will need to push her procedure back  probably about 6 weeks and she isnt on her blood thinner. It looked like she was suppose to stop 02-20-23. I did try to call patient to se about this but I left a voicemail. She didn't pick up

## 2023-02-26 NOTE — Telephone Encounter (Signed)
 We do not have enough samples (actually do not have any at this time) - have her check with her hematologist - they may have some

## 2023-02-26 NOTE — Telephone Encounter (Signed)
 Pt stated that she was recently given a Z pack for a cough which she states is getting better. Pt stated that she took her last dose 2 days ago. [facility] was contacted and spoke with charge nurse Olam who notified me of the policy that stated that she needed to wait 4 weeks after diagnosis or at least 2 weeks from the resolution of symptoms. Pt was notified of the policy and was rescheduled for 04/30/2022 at 9:30 AM at [facility] Case ID [id number]. Pt to arrive at 8:00 AM.  Pt made aware.  Updated my chart instructions were sent to pt.  Pt verbalized understanding with all questions answered.  Routed as FYI

## 2023-02-27 NOTE — Telephone Encounter (Signed)
 I see that she is scheduled for 3/10 Lets keep her on the schedule. Will perform EGD if clinical status permits RG

## 2023-02-27 NOTE — Telephone Encounter (Signed)
 Noted.

## 2023-02-27 NOTE — Telephone Encounter (Signed)
 Patient stated she no longer needs medication, Dr. Melvyn Neth has took her off medication

## 2023-02-28 ENCOUNTER — Ambulatory Visit: Payer: Medicare HMO | Admitting: Physician Assistant

## 2023-02-28 ENCOUNTER — Encounter: Payer: Self-pay | Admitting: Physician Assistant

## 2023-02-28 ENCOUNTER — Encounter: Payer: Self-pay | Admitting: Oncology

## 2023-02-28 VITALS — BP 110/62 | HR 96 | Temp 97.4°F | Ht 67.0 in | Wt 125.0 lb

## 2023-02-28 DIAGNOSIS — J22 Unspecified acute lower respiratory infection: Secondary | ICD-10-CM

## 2023-02-28 DIAGNOSIS — C3491 Malignant neoplasm of unspecified part of right bronchus or lung: Secondary | ICD-10-CM | POA: Diagnosis not present

## 2023-02-28 MED ORDER — AZITHROMYCIN 250 MG PO TABS: ORAL_TABLET | ORAL | 0 refills | Status: AC

## 2023-02-28 NOTE — Progress Notes (Addendum)
 Acute Office Visit  Subjective:    Patient ID: [name], female    DOB: 1950/03/04, 73 y.o.   MRN: [id number]  Chief Complaint  Patient presents with   Coughing/congestion    HPI: Patient is in today for recheck of cough and congestion.  She was treated for uri on 02/20/23 and given rx for zpack - she states overall is feeling better but still having productive cough esp at night Denies malaise or fever but is preparing for some testing/treatment with hematologist for her lung cancer and GI and concerned she is not completely better She is using mucinex  - she has singulair  and trelegy but not using daily   Current Outpatient Medications:    alum & mag hydroxide-simeth (MAALOX MAX) 400-400-40 MG/5ML suspension, Take 15 mLs by mouth every 6 (six) hours as needed for indigestion., Disp: 355 mL, Rfl: 1   azithromycin  (ZITHROMAX ) 250 MG tablet, Take 2 tablets on day 1, then 1 tablet daily on days 2 through 5, Disp: 6 tablet, Rfl: 0   Fluticasone -Umeclidin-Vilant (TRELEGY ELLIPTA ) 100-62.5-25 MCG/ACT AEPB, Inhale 1 puff into the lungs daily. (Patient taking differently: Inhale 1 puff into the lungs daily as needed (For shortness of breath).), Disp: 2 each, Rfl: 0   lidocaine -prilocaine  (EMLA ) cream, Apply 1 Application topically as needed. Apply 1 hour prior to lab appt. Cover with plastic wrap,, Disp: 30 g, Rfl: 0   montelukast  (SINGULAIR ) 10 MG tablet, TAKE 1 TABLET AT BEDTIME, Disp: 90 tablet, Rfl: 1   mupirocin  ointment (BACTROBAN ) 2 %, Apply 1 application topically 2 (two) times daily. (Patient taking differently: Apply 1 application  topically 2 (two) times daily as needed (wound care).), Disp: 22 g, Rfl: 2   ondansetron  (ZOFRAN -ODT) 8 MG disintegrating tablet, Take 1 tablet (8 mg total) by mouth every 8 (eight) hours as needed for nausea or vomiting., Disp: 30 tablet, Rfl: 2   pantoprazole  (PROTONIX ) 40 MG tablet, Take 1 tablet (40 mg total) by mouth 2 (two) times daily., Disp: 180  tablet, Rfl: 3   polyethylene glycol (MIRALAX / GLYCOLAX) 17 g packet, Take 17 g by mouth daily as needed for moderate constipation., Disp: , Rfl:    prochlorperazine  (COMPAZINE ) 25 MG suppository, Place 1 suppository (25 mg total) rectally every 12 (twelve) hours., Disp: 30 suppository, Rfl: 1   promethazine  (PHENERGAN ) 25 MG tablet, Take 1 tablet (25 mg total) by mouth every 6 (six) hours as needed for nausea., Disp: 30 tablet, Rfl: 3  Allergies  Allergen Reactions   Codeine Hives   Surgical Lubricant Hives    ROS CONSTITUTIONAL: Negative for chills, fatigue, fever, E/N/T: see HPI CARDIOVASCULAR: Negative for chest pain, dizziness, palpitations and pedal edema.  RESPIRATORY: see HPI GASTROINTESTINAL: Negative for abdominal pain, acid reflux symptoms, constipation, diarrhea, nausea and vomiting.       Objective:    PHYSICAL EXAM:   BP 110/62 (BP Location: Left Arm, Patient Position: Sitting)   Pulse 96   Temp (!) 97.4 F (36.3 C) (Temporal)   Ht 5' 7 (1.702 m)   Wt 125 lb (56.7 kg)   SpO2 98%   BMI 19.58 kg/m    GEN: Well nourished, well developed, in no acute distress  HEENT: normal external ears and nose - normal external auditory canals and TMS -  - Lips, Teeth and Gums - normal  Oropharynx -mild erythema noted Cardiac: RRR; no murmurs, Respiratory:  faint scattered rhonchi in lower lung fields - clears with cough  Skin: warm and  dry, no rash        Assessment & Plan:    Lower resp. tract infection -     Azithromycin ; Take 2 tablets on day 1, then 1 tablet daily on days 2 through 5  Dispense: 6 tablet; Refill: 0 Take singulair  and trelegy as directed Primary lung cancer, right [facility]) Continue follow up with hematologist and pulmonologist    Follow-up: Return for as scheduled for next chronic visit.  An After Visit Summary was printed and given to the patient.  CAMIE JONELLE NICHOLAUS DEVONNA [facility] [phone]

## 2023-03-01 ENCOUNTER — Encounter: Payer: Self-pay | Admitting: Oncology

## 2023-03-01 ENCOUNTER — Ambulatory Visit: Payer: Medicare HMO | Admitting: Physician Assistant

## 2023-03-04 ENCOUNTER — Other Ambulatory Visit: Payer: Self-pay

## 2023-03-04 ENCOUNTER — Ambulatory Visit (HOSPITAL_BASED_OUTPATIENT_CLINIC_OR_DEPARTMENT_OTHER): Payer: Self-pay

## 2023-03-04 ENCOUNTER — Encounter (HOSPITAL_COMMUNITY): Payer: Self-pay

## 2023-03-04 ENCOUNTER — Emergency Department (HOSPITAL_COMMUNITY)
Admission: EM | Admit: 2023-03-04 | Discharge: 2023-03-04 | Disposition: A | Discharge status: Home / Self Care | Payer: No Typology Code available for payment source | Attending: Emergency Medicine | Admitting: Emergency Medicine

## 2023-03-04 DIAGNOSIS — Z85118 Personal history of other malignant neoplasm of bronchus and lung: Secondary | ICD-10-CM | POA: Insufficient documentation

## 2023-03-04 DIAGNOSIS — R1111 Vomiting without nausea: Secondary | ICD-10-CM | POA: Diagnosis not present

## 2023-03-04 DIAGNOSIS — R112 Nausea with vomiting, unspecified: Secondary | ICD-10-CM | POA: Insufficient documentation

## 2023-03-04 DIAGNOSIS — R197 Diarrhea, unspecified: Secondary | ICD-10-CM | POA: Insufficient documentation

## 2023-03-04 DIAGNOSIS — R059 Cough, unspecified: Secondary | ICD-10-CM | POA: Diagnosis not present

## 2023-03-04 DIAGNOSIS — Z20822 Contact with and (suspected) exposure to covid-19: Secondary | ICD-10-CM | POA: Insufficient documentation

## 2023-03-04 DIAGNOSIS — R000 Tachycardia, unspecified: Secondary | ICD-10-CM | POA: Diagnosis not present

## 2023-03-04 DIAGNOSIS — C3491 Malignant neoplasm of unspecified part of right bronchus or lung: Secondary | ICD-10-CM

## 2023-03-04 LAB — COMPREHENSIVE METABOLIC PANEL
ALT: 23 U/L (ref 0–44)
AST: 28 U/L (ref 15–41)
Albumin: 2.6 g/dL — ABNORMAL LOW (ref 3.5–5.0)
Alkaline Phosphatase: 96 U/L (ref 38–126)
Anion gap: 9 (ref 5–15)
BUN: 10 mg/dL (ref 8–23)
CO2: 24 mmol/L (ref 22–32)
Calcium: 8.1 mg/dL — ABNORMAL LOW (ref 8.9–10.3)
Chloride: 102 mmol/L (ref 98–111)
Creatinine, Ser: 0.68 mg/dL (ref 0.44–1.00)
GFR, Estimated: >60 mL/min (ref >60)
Glucose, Bld: 107 mg/dL — ABNORMAL HIGH (ref 70–99)
Potassium: 4 mmol/L (ref 3.5–5.1)
Sodium: 135 mmol/L (ref 135–145)
Total Bilirubin: 0.7 mg/dL (ref 0.0–1.2)
Total Protein: 6.5 g/dL (ref 6.5–8.1)

## 2023-03-04 LAB — I-STAT CHEM 8, ED
BUN: 8 mg/dL (ref 8–23)
Calcium, Ion: 1.07 mmol/L — ABNORMAL LOW (ref 1.15–1.40)
Chloride: 102 mmol/L (ref 98–111)
Creatinine, Ser: 0.8 mg/dL (ref 0.44–1.00)
Glucose, Bld: 103 mg/dL — ABNORMAL HIGH (ref 70–99)
HCT: 34 % — ABNORMAL LOW (ref 36.0–46.0)
Hemoglobin: 11.6 g/dL — ABNORMAL LOW (ref 12.0–15.0)
Potassium: 4 mmol/L (ref 3.5–5.1)
Sodium: 137 mmol/L (ref 135–145)
TCO2: 23 mmol/L (ref 22–32)

## 2023-03-04 LAB — CBC WITH DIFFERENTIAL/PLATELET
Abs Immature Granulocytes: 0.06 10*3/uL (ref 0.00–0.07)
Basophils Absolute: 0 10*3/uL (ref 0.0–0.1)
Basophils Relative: 0 %
Eosinophils Absolute: 0 10*3/uL (ref 0.0–0.5)
Eosinophils Relative: 0 %
HCT: 39 % (ref 36.0–46.0)
Hemoglobin: 12.5 g/dL (ref 12.0–15.0)
Immature Granulocytes: 1 %
Lymphocytes Relative: 3 %
Lymphs Abs: 0.3 10*3/uL — ABNORMAL LOW (ref 0.7–4.0)
MCH: 27.7 pg (ref 26.0–34.0)
MCHC: 32.1 g/dL (ref 30.0–36.0)
MCV: 86.3 fL (ref 80.0–100.0)
Monocytes Absolute: 0.4 10*3/uL (ref 0.1–1.0)
Monocytes Relative: 3 %
Neutro Abs: 11.2 10*3/uL — ABNORMAL HIGH (ref 1.7–7.7)
Neutrophils Relative %: 93 %
Platelets: 442 10*3/uL — ABNORMAL HIGH (ref 150–400)
RBC: 4.52 MIL/uL (ref 3.87–5.11)
RDW: 13.2 % (ref 11.5–15.5)
WBC: 12 10*3/uL — ABNORMAL HIGH (ref 4.0–10.5)
nRBC: 0 % (ref 0.0–0.2)

## 2023-03-04 LAB — RESP PANEL BY RT-PCR (RSV, FLU A&B, COVID)  RVPGX2
Influenza A by PCR: NEGATIVE
Influenza B by PCR: NEGATIVE
Resp Syncytial Virus by PCR: NEGATIVE
SARS Coronavirus 2 by RT PCR: NEGATIVE

## 2023-03-04 LAB — MAGNESIUM: Magnesium: 1.8 mg/dL (ref 1.7–2.4)

## 2023-03-04 MED ORDER — SODIUM CHLORIDE 0.9 % IV BOLUS
1000.0000 mL | Freq: Once | INTRAVENOUS | Status: AC
  Administered 2023-03-04: 1000 mL via INTRAVENOUS

## 2023-03-04 MED ORDER — ONDANSETRON HCL 4 MG/2ML IJ SOLN
4.0000 mg | Freq: Once | INTRAMUSCULAR | Status: AC
  Administered 2023-03-04: 4 mg via INTRAVENOUS
  Filled 2023-03-04: qty 2

## 2023-03-04 MED ORDER — ONDANSETRON 4 MG PO TBDP: 4.0000 mg | ORAL_TABLET | Freq: Three times a day (TID) | ORAL | 0 refills | Status: DC | PRN

## 2023-03-04 NOTE — Discharge Instructions (Addendum)
 I am glad you are feeling better, your labs are reassuring, please follow-up with your primary care doctor, who prescribed you some nausea medicine to help with your nausea/vomiting.  Make sure you are drinking lots of fluids, and resting

## 2023-03-04 NOTE — ED Triage Notes (Signed)
 Patient arrived via EMS from urgent care due to nausea, vomiting and diarrhea since 0030 today. Patient was seen at urgent care and discharged, so her daughter called pt's cancer nurse and was told to come here. Pt reports that she was not given any medications at urgent care.

## 2023-03-04 NOTE — ED Provider Notes (Signed)
 [place] EMERGENCY DEPARTMENT AT [facility] Provider Note   CSN: [id number] Arrival date & time: 03/04/23  1132     History  Chief Complaint  Patient presents with   Emesis   Diarrhea    [name] is a 73 y.o. female, history of lung cancer, who presents to the ED secondary to nausea, vomiting, diarrhea today.  She is at 10+ episodes of vomiting today, and 2 episodes of diarrhea.  Denies any abdominal pain, chest pain, or shortness of breath.  Has recently been treated for URI, with a Z-Pak, is almost on her last day, and states that she was doing pretty well, until today.  She is concerned she has the flu and would like to be tested.  She denies any shortness of breath, and states her cough is getting better. She is a cancer patient, and completed her fourth round of immunotherapy, on 1/2 without complications.    Home Medications Prior to Admission medications   Medication Sig Start Date End Date Taking? Authorizing Provider  ondansetron  (ZOFRAN -ODT) 4 MG disintegrating tablet Take 1 tablet (4 mg total) by mouth every 8 (eight) hours as needed. 03/04/23  Yes Verneta Hamidi L, PA  alum & mag hydroxide-simeth (MAALOX MAX) 400-400-40 MG/5ML suspension Take 15 mLs by mouth every 6 (six) hours as needed for indigestion. 10/19/22   Sebastian Toribio GAILS, MD  azithromycin  (ZITHROMAX ) 250 MG tablet Take 2 tablets on day 1, then 1 tablet daily on days 2 through 5 02/28/23 03/05/23  Nicholaus Credit, PA-C  Fluticasone -Umeclidin-Vilant (TRELEGY ELLIPTA ) 100-62.5-25 MCG/ACT AEPB Inhale 1 puff into the lungs daily. Patient taking differently: Inhale 1 puff into the lungs daily as needed (For shortness of breath). 05/06/21   Charlene Clotilda PARAS, NP  lidocaine -prilocaine  (EMLA ) cream Apply 1 Application topically as needed. Apply 1 hour prior to lab appt. Cover with plastic wrap, 10/27/22   Heilingoetter, Cassandra L, PA-C  montelukast  (SINGULAIR ) 10 MG tablet TAKE 1 TABLET AT BEDTIME 11/22/22   Nicholaus Credit, PA-C  mupirocin  ointment (BACTROBAN ) 2 % Apply 1 application topically 2 (two) times daily. Patient taking differently: Apply 1 application  topically 2 (two) times daily as needed (wound care). 12/10/20   Nicholaus Credit, PA-C  pantoprazole  (PROTONIX ) 40 MG tablet Take 1 tablet (40 mg total) by mouth 2 (two) times daily. 12/20/22   Charlanne Groom, MD  polyethylene glycol (MIRALAX / GLYCOLAX) 17 g packet Take 17 g by mouth daily as needed for moderate constipation.    [provider]  prochlorperazine  (COMPAZINE ) 25 MG suppository Place 1 suppository (25 mg total) rectally every 12 (twelve) hours. 10/19/22   Sebastian Toribio GAILS, MD  promethazine  (PHENERGAN ) 25 MG tablet Take 1 tablet (25 mg total) by mouth every 6 (six) hours as needed for nausea. 02/22/23   Harl Eleanor LABOR, NP      Allergies    Codeine and Surgical lubricant    Review of Systems   Review of Systems  Gastrointestinal:  Positive for diarrhea and vomiting. Negative for abdominal pain.    Physical Exam Updated Vital Signs BP (!) 101/57   Pulse (!) 102   Temp 98.2 F (36.8 C) (Oral)   Resp (!) 30   Ht 5' 7 (1.702 m)   Wt 56.7 kg   SpO2 99%   BMI 19.58 kg/m  Physical Exam Vitals and nursing note reviewed.  Constitutional:      General: She is not in acute distress.    Appearance: She is  well-developed.  HENT:     Head: Normocephalic and atraumatic.     Mouth/Throat:     Mouth: Mucous membranes are dry.  Eyes:     Conjunctiva/sclera: Conjunctivae normal.  Cardiovascular:     Rate and Rhythm: Normal rate and regular rhythm.     Heart sounds: No murmur heard. Pulmonary:     Effort: Pulmonary effort is normal. No respiratory distress.     Breath sounds: Normal breath sounds.  Abdominal:     Palpations: Abdomen is soft.     Tenderness: There is no abdominal tenderness.  Musculoskeletal:        General: No swelling.     Cervical back: Neck supple.  Skin:    General: Skin is warm and dry.      Capillary Refill: Capillary refill takes less than 2 seconds.  Neurological:     Mental Status: She is alert.  Psychiatric:        Mood and Affect: Mood normal.     ED Results / Procedures / Treatments   Labs (all labs ordered are listed, but only abnormal results are displayed) Labs Reviewed  CBC WITH DIFFERENTIAL/PLATELET - Abnormal; Notable for the following components:      Result Value   WBC 12.0 (*)    Platelets 442 (*)    Neutro Abs 11.2 (*)    Lymphs Abs 0.3 (*)    All other components within normal limits  COMPREHENSIVE METABOLIC PANEL - Abnormal; Notable for the following components:   Glucose, Bld 107 (*)    Calcium  8.1 (*)    Albumin  2.6 (*)    All other components within normal limits  I-STAT CHEM 8, ED - Abnormal; Notable for the following components:   Glucose, Bld 103 (*)    Calcium , Ion 1.07 (*)    Hemoglobin 11.6 (*)    HCT 34.0 (*)    All other components within normal limits  RESP PANEL BY RT-PCR (RSV, FLU A&B, COVID)  RVPGX2  MAGNESIUM     EKG None  Radiology No results found.  Procedures Procedures    Medications Ordered in ED Medications  sodium chloride  0.9 % bolus 1,000 mL (0 mLs Intravenous Stopped 03/04/23 1430)  ondansetron  (ZOFRAN ) injection 4 mg (4 mg Intravenous Given 03/04/23 1311)    ED Course/ Medical Decision Making/ A&P                                 Medical Decision Making Patient is a 73 year old female, here for nausea, vomiting, diarrhea, this been going on for the last day.  She states she is on azithromycin , for an upper respiratory infection, which she states is getting a lot better.  She has had 10+ episodes of vomiting, and 2 episodes of diarrhea.  No blood in the diarrhea.  Denies any abdominal pain.  No tenderness to palpation, afebrile. We will get electrolytes, and give her IV fluids, and Zofran .  No chest x-rays at this time, as the upper respiratory infection is improving per patient.  She is not having copious  diarrhea at this time, thus unlikely to be C. difficile.  Amount and/or Complexity of Data Reviewed Labs: ordered.    Details: No electrolyte abnormalities ECG/medicine tests:     Details: HR of 103 bpm. Normal sinus rhythm.  Discussion of management or test interpretation with external provider(s): Discussed with patient, she is feeling much better, labs have hemolyzed twice, will  obtain i-STAT, new acute findings with electrolyte deficiencies.  She likely is having/nausea/vomiting/diarrhea from her viral illness, versus from the antibiotic.  I prescribed her Zofran , and discharged her home with strict return precautions.  She successfully p.o. trialed.  And was well appearing on exam  Risk Prescription drug management.   Final Clinical Impression(s) / ED Diagnoses Final diagnoses:  Nausea vomiting and diarrhea    Rx / DC Orders ED Discharge Orders          Ordered    ondansetron  (ZOFRAN -ODT) 4 MG disintegrating tablet  Every 8 hours PRN        03/04/23 1538              Polly Barner, [place], PA 03/04/23 1615    Charlyn Sora, MD 03/05/23 [phone]

## 2023-03-04 NOTE — ED Notes (Signed)
 10cc of waste drawn; light green tube recollected for lab

## 2023-03-07 ENCOUNTER — Telehealth: Payer: Self-pay | Admitting: Hematology and Oncology

## 2023-03-07 NOTE — Telephone Encounter (Signed)
 CT C/A/P has been scheduled for 03/21/23; Checking in @ 10:30 am.  Notified pt of date,time and instructions.

## 2023-03-15 ENCOUNTER — Ambulatory Visit: Payer: Medicare HMO | Admitting: Physician Assistant

## 2023-03-21 ENCOUNTER — Encounter: Payer: Self-pay | Admitting: Physician Assistant

## 2023-03-21 DIAGNOSIS — C3491 Malignant neoplasm of unspecified part of right bronchus or lung: Secondary | ICD-10-CM | POA: Diagnosis not present

## 2023-03-21 LAB — LAB REPORT - SCANNED: EGFR: 77

## 2023-03-21 NOTE — Progress Notes (Unsigned)
[facility]  [address] [place],  [place]  [address] [phone]  Clinic Day:  01/25/2023  Referring physician: Weston Settle, MD   HISTORY OF PRESENT ILLNESS:  The patient is a 73 y.o. 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  She recently started her maintenance durvalumab immunotherapy.  She comes in today to go over her CT scans to ascertain her new disease baseline after receiving 4 cycles of durvalumab.  She claims to have tolerated her 4th cycle very well.  She claims to still cough, but this respiratory symptom is not as prominent as it was previously.   With respect to her lung cancer history, she initially underwent a right middle lobectomy in February 2022.  Due to the mediastinal nodal recurrence in May 2024, the patient underwent definitive chemoradiation with weekly carboplatin/paclitaxel, which was completed in late July 2024.    PHYSICAL EXAM:  There were no vitals taken for this visit. Wt Readings from Last 3 Encounters:  03/04/23 125 lb (56.7 kg)  02/28/23 125 lb (56.7 kg)  02/22/23 125 lb 9.6 oz (57 kg)   There is no height or weight on file to calculate BMI. Performance status (ECOG): 2 - Symptomatic, <50% confined to bed Physical Exam Constitutional:      Appearance: Normal appearance. She is not ill-appearing.     Comments: A chronically ill-appearing woman in no acute distress.  She is ambulating with a cane  HENT:     Mouth/Throat:     Mouth: Mucous membranes are moist.     Pharynx: Oropharynx is clear. No oropharyngeal exudate or posterior oropharyngeal erythema.  Cardiovascular:     Rate and Rhythm: Normal rate and regular rhythm.     Heart sounds: No murmur heard.    No friction rub. No gallop.  Pulmonary:     Effort: Pulmonary effort is normal. No respiratory distress.     Breath sounds: Decreased air movement present. Examination  of the right-upper field reveals decreased breath sounds. Examination of the left-upper field reveals decreased breath sounds. Decreased breath sounds present. No wheezing, rhonchi or rales.  Abdominal:     General: Bowel sounds are normal. There is no distension.     Palpations: Abdomen is soft. There is no mass.     Tenderness: There is no abdominal tenderness.  Musculoskeletal:        General: No swelling.     Right lower leg: No edema.     Left lower leg: No edema.  Lymphadenopathy:     Cervical: No cervical adenopathy.     Upper Body:     Right upper body: No supraclavicular or axillary adenopathy.     Left upper body: No supraclavicular or axillary adenopathy.     Lower Body: No right inguinal adenopathy. No left inguinal adenopathy.  Skin:    General: Skin is warm.     Coloration: Skin is not jaundiced.     Findings: No lesion or rash.  Neurological:     General: No focal deficit present.     Mental Status: She is alert and oriented to person, place, and time. Mental status is at baseline.  Psychiatric:        Mood and Affect: Mood normal.        Behavior: Behavior normal.        Thought Content: Thought content normal.   SCANS:  CT scans  of her chest/abdomen/pelvis revealed the following: FINDINGS: CHEST: Lines and tubes: None.  Mediastinum: No evidence of mediastinal hemorrhage.  Heart: Normal heart size. No pericardial effusion.  Thoracic Aorta: No evidence of acute traumatic aortic injury.  Lungs and Airways: Consolidative opacity within the medial aspects of the right middle lobe and the right lower lobe.  Pleura: Soft tissue density is noted within the right cardiophrenic angle, unchanged.  Bones: No acute osseous abnormality identified.   ABDOMEN AND PELVIS:  Liver: Diffuse low-attenuation may represent hepatic steatosis.  Gallbladder: Unremarkable.  Spleen: Unremarkable.  Pancreas: Unremarkable.  Adrenals: Unremarkable.  Kidneys:  Unremarkable.  Bladder: Unremarkable.  Reproductive Organs: Unremarkable.  Bowel: Unremarkable.  Vasculature: Major vascular structures are unremarkable.  Peritoneum / Retroperitoneum: No free fluid. No free air.  Bones: No acute osseous abnormality identified.  IMPRESSION: 1. Consolidative opacities within the medial aspects of the right middle lobe and right lower lobe may represent pneumonia. However, underlying mass is not excluded. 2. Soft tissue density mass is noted within the right cardiophrenic angle anteriorly, unchanged. LABS:      Latest Ref Rng & Units 03/04/2023    3:09 PM 03/04/2023    1:14 PM 02/22/2023    9:36 AM  CBC  WBC 4.0 - 10.5 K/uL  12.0  10.8   Hemoglobin 12.0 - 15.0 g/dL 11.9  14.7  82.9   Hematocrit 36.0 - 46.0 % 34.0  39.0  33.8   Platelets 150 - 400 K/uL  442  364       Latest Ref Rng & Units 03/04/2023    3:09 PM 03/04/2023    3:04 PM 02/22/2023    9:36 AM  CMP  Glucose 70 - 99 mg/dL 562  130  865   BUN 8 - 23 mg/dL 8  10  15    Creatinine 0.44 - 1.00 mg/dL 7.84  6.96  2.95   Sodium 135 - 145 mmol/L 137  135  132   Potassium 3.5 - 5.1 mmol/L 4.0  4.0  3.8   Chloride 98 - 111 mmol/L 102  102  96   CO2 22 - 32 mmol/L  24  23   Calcium 8.9 - 10.3 mg/dL  8.1  9.1   Total Protein 6.5 - 8.1 g/dL  6.5  7.0   Total Bilirubin 0.0 - 1.2 mg/dL  0.7  0.5   Alkaline Phos 38 - 126 U/L  96  124   AST 15 - 41 U/L  28  36   ALT 0 - 44 U/L  23  26    ASSESSMENT & PLAN:  A 73 y.o. 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   She will proceed with her 3rd cycle of durvalumab immunotherapy today.  Of note, she will continue taking Eliquis for her incidentally found pulmonary emboli through the end of this calendar year.   Overall, she appears to be doing better.  I will see this patient back in 4 weeks before she heads into her 4th cycle of maintenance durvalumab immunotherapy.  The patient understands all the plans  discussed today and is in agreement with them.  Alizabeth Antonio Kirby Funk, MD

## 2023-03-22 ENCOUNTER — Other Ambulatory Visit: Payer: Self-pay

## 2023-03-22 ENCOUNTER — Inpatient Hospital Stay: Payer: No Typology Code available for payment source

## 2023-03-22 ENCOUNTER — Other Ambulatory Visit: Payer: Medicare HMO

## 2023-03-22 ENCOUNTER — Inpatient Hospital Stay: Payer: No Typology Code available for payment source | Admitting: Oncology

## 2023-03-22 ENCOUNTER — Encounter: Payer: Self-pay | Admitting: Physician Assistant

## 2023-03-22 VITALS — BP 102/71 | HR 113 | Temp 97.6°F | Resp 16 | Ht 67.0 in | Wt 127.5 lb

## 2023-03-22 DIAGNOSIS — C3491 Malignant neoplasm of unspecified part of right bronchus or lung: Secondary | ICD-10-CM

## 2023-03-22 DIAGNOSIS — Z5112 Encounter for antineoplastic immunotherapy: Secondary | ICD-10-CM | POA: Diagnosis not present

## 2023-03-22 LAB — IRON AND TIBC
Iron: 39 ug/dL (ref 28–170)
Saturation Ratios: 10 % — ABNORMAL LOW (ref 10.4–31.8)
TIBC: 381 ug/dL (ref 250–450)
UIBC: 342 ug/dL

## 2023-03-22 LAB — FERRITIN: Ferritin: 14 ng/mL (ref 11–307)

## 2023-03-22 MED ORDER — SODIUM CHLORIDE 0.9% FLUSH
10.0000 mL | INTRAVENOUS | Status: DC | PRN
  Administered 2023-03-22: 10 mL

## 2023-03-22 MED ORDER — SODIUM CHLORIDE 0.9 % IV SOLN: Freq: Once | INTRAVENOUS | Status: AC

## 2023-03-22 MED ORDER — SODIUM CHLORIDE 0.9 % IV SOLN
1500.0000 mg | Freq: Once | INTRAVENOUS | Status: AC
  Administered 2023-03-22: 1500 mg via INTRAVENOUS
  Filled 2023-03-22: qty 30

## 2023-03-22 MED ORDER — HEPARIN SOD (PORK) LOCK FLUSH 100 UNIT/ML IV SOLN
500.0000 [IU] | Freq: Once | INTRAVENOUS | Status: AC | PRN
  Administered 2023-03-22: 500 [IU]

## 2023-03-22 MED ORDER — ALTEPLASE 2 MG IJ SOLR
2.0000 mg | Freq: Once | INTRAMUSCULAR | Status: AC | PRN
  Administered 2023-03-22: 2 mg
  Filled 2023-03-22: qty 2

## 2023-03-22 NOTE — Progress Notes (Signed)
Per dr. Melvyn Neth, ok to treat patient with HR 113, Give 1 L NS bolus.

## 2023-03-22 NOTE — Patient Instructions (Signed)
Durvalumab Injection What is this medication? DURVALUMAB (dur VAL ue mab) treats some types of cancer. It works by helping your immune system slow or stop the spread of cancer cells. It is a monoclonal antibody. This medicine may be used for other purposes; ask your health care provider or pharmacist if you have questions. COMMON BRAND NAME(S): IMFINZI What should I tell my care team before I take this medication? They need to know if you have any of these conditions: Allogeneic stem cell transplant (uses someone else's stem cells) Autoimmune diseases, such as Crohn disease, ulcerative colitis, lupus History of chest radiation Nervous system problems, such as Guillain-Barre syndrome, myasthenia gravis Organ transplant An unusual or allergic reaction to durvalumab, other medications, foods, dyes, or preservatives Pregnant or trying to get pregnant Breast-feeding How should I use this medication? This medication is infused into a vein. It is given by your care team in a hospital or clinic setting. A special MedGuide will be given to you before each treatment. Be sure to read this information carefully each time. Talk to your care team about the use of this medication in children. Special care may be needed. Overdosage: If you think you have taken too much of this medicine contact a poison control center or emergency room at once. NOTE: This medicine is only for you. Do not share this medicine with others. What if I miss a dose? Keep appointments for follow-up doses. It is important not to miss your dose. Call your care team if you are unable to keep an appointment. What may interact with this medication? Interactions have not been studied. This list may not describe all possible interactions. Give your health care provider a list of all the medicines, herbs, non-prescription drugs, or dietary supplements you use. Also tell them if you smoke, drink alcohol, or use illegal drugs. Some items may  interact with your medicine. What should I watch for while using this medication? Your condition will be monitored carefully while you are receiving this medication. You may need blood work while taking this medication. This medication may cause serious skin reactions. They can happen weeks to months after starting the medication. Contact your care team right away if you notice fevers or flu-like symptoms with a rash. The rash may be red or purple and then turn into blisters or peeling of the skin. You may also notice a red rash with swelling of the face, lips, or lymph nodes in your neck or under your arms. Tell your care team right away if you have any change in your eyesight. Talk to your care team if you may be pregnant. Serious birth defects can occur if you take this medication during pregnancy and for 3 months after the last dose. You will need a negative pregnancy test before starting this medication. Contraception is recommended while taking this medication and for 3 months after the last dose. Your care team can help you find the option that works for you. Do not breastfeed while taking this medication and for 3 months after the last dose. What side effects may I notice from receiving this medication? Side effects that you should report to your care team as soon as possible: Allergic reactions--skin rash, itching, hives, swelling of the face, lips, tongue, or throat Dry cough, shortness of breath or trouble breathing Eye pain, redness, irritation, or discharge with blurry or decreased vision Heart muscle inflammation--unusual weakness or fatigue, shortness of breath, chest pain, fast or irregular heartbeat, dizziness, swelling of the  ankles, feet, or hands Hormone gland problems--headache, sensitivity to light, unusual weakness or fatigue, dizziness, fast or irregular heartbeat, increased sensitivity to cold or heat, excessive sweating, constipation, hair loss, increased thirst or amount of  urine, tremors or shaking, irritability Infusion reactions--chest pain, shortness of breath or trouble breathing, feeling faint or lightheaded Kidney injury (glomerulonephritis)--decrease in the amount of urine, red or dark brown urine, foamy or bubbly urine, swelling of the ankles, hands, or feet Liver injury--right upper belly pain, loss of appetite, nausea, light-colored stool, dark yellow or brown urine, yellowing skin or eyes, unusual weakness or fatigue Pain, tingling, or numbness in the hands or feet, muscle weakness, change in vision, confusion or trouble speaking, loss of balance or coordination, trouble walking, seizures Rash, fever, and swollen lymph nodes Redness, blistering, peeling, or loosening of the skin, including inside the mouth Sudden or severe stomach pain, bloody diarrhea, fever, nausea, vomiting Side effects that usually do not require medical attention (report these to your care team if they continue or are bothersome): Bone, joint, or muscle pain Diarrhea Fatigue Loss of appetite Nausea Skin rash This list may not describe all possible side effects. Call your doctor for medical advice about side effects. You may report side effects to FDA at 1-800-FDA-1088. Where should I keep my medication? This medication is given in a hospital or clinic. It will not be stored at home. NOTE: This sheet is a summary. It may not cover all possible information. If you have questions about this medicine, talk to your doctor, pharmacist, or health care provider.  2024 Elsevier/Gold Standard (2021-06-21 00:00:00)

## 2023-03-26 ENCOUNTER — Ambulatory Visit: Payer: Medicare HMO | Admitting: Physician Assistant

## 2023-03-30 ENCOUNTER — Telehealth: Payer: Self-pay | Admitting: Gastroenterology

## 2023-03-30 MED ORDER — PANTOPRAZOLE SODIUM 40 MG PO TBEC: 40.0000 mg | DELAYED_RELEASE_TABLET | Freq: Two times a day (BID) | ORAL | 1 refills | Status: DC

## 2023-03-30 NOTE — Telephone Encounter (Signed)
 Inbound call frm patient, would like pantoprazole  prescription now sent to [facility] [address] [place]. She states she got new insurance and would like to get it into a new pharmacy.

## 2023-03-30 NOTE — Telephone Encounter (Signed)
 Done

## 2023-04-18 NOTE — Progress Notes (Unsigned)
 [facility]  [address] [place],  [place]  [address] [phone]  Clinic Day:  03/22/2023  Referring physician: [name], PA-C   HISTORY OF PRESENT ILLNESS:  The patient is a 73 y.o. 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  She comes in today to be evaluated before heading into her 6th cycle of durvalumab.  She claims to have tolerated her 5th cycle very well.  She is feeling better now.  She does not believe these recent symptoms were due   With respect to her lung cancer history, she initially underwent a right middle lobectomy in February 2022.  Due to the mediastinal nodal recurrence in May 2024, the patient underwent definitive chemoradiation with weekly carboplatin/paclitaxel, which was completed in late July 2024.  Since then, she has been on maintenance durvalumab, which she continues to take.    PHYSICAL EXAM:  There were no vitals taken for this visit. Wt Readings from Last 3 Encounters:  03/22/23 127 lb 8 oz (57.8 kg)  03/04/23 125 lb (56.7 kg)  02/28/23 125 lb (56.7 kg)   There is no height or weight on file to calculate BMI. Performance status (ECOG): 2 - Symptomatic, <50% confined to bed Physical Exam Constitutional:      Appearance: Normal appearance. She is not ill-appearing.     Comments: A chronically ill-appearing woman in no acute distress.  She is ambulating with a cane  HENT:     Mouth/Throat:     Mouth: Mucous membranes are moist.     Pharynx: Oropharynx is clear. No oropharyngeal exudate or posterior oropharyngeal erythema.  Cardiovascular:     Rate and Rhythm: Normal rate and regular rhythm.     Heart sounds: No murmur heard.    No friction rub. No gallop.  Pulmonary:     Effort: Pulmonary effort is normal. No respiratory distress.     Breath sounds: Decreased air movement present. Examination of the right-upper field reveals decreased breath sounds.  Examination of the left-upper field reveals decreased breath sounds. Decreased breath sounds present. No wheezing, rhonchi or rales.  Abdominal:     General: Bowel sounds are normal. There is no distension.     Palpations: Abdomen is soft. There is no mass.     Tenderness: There is no abdominal tenderness.  Musculoskeletal:        General: No swelling.     Right lower leg: No edema.     Left lower leg: No edema.  Lymphadenopathy:     Cervical: No cervical adenopathy.     Upper Body:     Right upper body: No supraclavicular or axillary adenopathy.     Left upper body: No supraclavicular or axillary adenopathy.     Lower Body: No right inguinal adenopathy. No left inguinal adenopathy.  Skin:    General: Skin is warm.     Coloration: Skin is not jaundiced.     Findings: No lesion or rash.  Neurological:     General: No focal deficit present.     Mental Status: She is alert and oriented to person, place, and time. Mental status is at baseline.  Psychiatric:        Mood and Affect: Mood normal.        Behavior: Behavior normal.        Thought Content: Thought content normal.   SCANS:  CT scans of her chest/abdomen/pelvis revealed the following: FINDINGS: CHEST: Lines  and tubes: None.  Mediastinum: No evidence of mediastinal hemorrhage.  Heart: Normal heart size. No pericardial effusion.  Thoracic Aorta: No evidence of acute traumatic aortic injury.  Lungs and Airways: Consolidative opacity within the medial aspects of the right middle lobe and the right lower lobe.  Pleura: Soft tissue density is noted within the right cardiophrenic angle, unchanged.  Bones: No acute osseous abnormality identified.   ABDOMEN AND PELVIS:  Liver: Diffuse low-attenuation may represent hepatic steatosis.  Gallbladder: Unremarkable.  Spleen: Unremarkable.  Pancreas: Unremarkable.  Adrenals: Unremarkable.  Kidneys: Unremarkable.  Bladder: Unremarkable.  Reproductive Organs:  Unremarkable.  Bowel: Unremarkable.  Vasculature: Major vascular structures are unremarkable.  Peritoneum / Retroperitoneum: No free fluid. No free air.  Bones: No acute osseous abnormality identified.  IMPRESSION: 1. Consolidative opacities within the medial aspects of the right middle lobe and right lower lobe may represent pneumonia. However, underlying mass is not excluded. 2. Soft tissue density mass is noted within the right cardiophrenic angle anteriorly, unchanged. LABS:      Latest Ref Rng & Units 03/04/2023    3:09 PM 03/04/2023    1:14 PM 02/22/2023    9:36 AM  CBC  WBC 4.0 - 10.5 K/uL  12.0  10.8   Hemoglobin 12.0 - 15.0 g/dL 54.0  98.1  19.1   Hematocrit 36.0 - 46.0 % 34.0  39.0  33.8   Platelets 150 - 400 K/uL  442  364       Latest Ref Rng & Units 03/04/2023    3:09 PM 03/04/2023    3:04 PM 02/22/2023    9:36 AM  CMP  Glucose 70 - 99 mg/dL 478  295  621   BUN 8 - 23 mg/dL 8  10  15    Creatinine 0.44 - 1.00 mg/dL 3.08  6.57  8.46   Sodium 135 - 145 mmol/L 137  135  132   Potassium 3.5 - 5.1 mmol/L 4.0  4.0  3.8   Chloride 98 - 111 mmol/L 102  102  96   CO2 22 - 32 mmol/L  24  23   Calcium 8.9 - 10.3 mg/dL  8.1  9.1   Total Protein 6.5 - 8.1 g/dL  6.5  7.0   Total Bilirubin 0.0 - 1.2 mg/dL  0.7  0.5   Alkaline Phos 38 - 126 U/L  96  124   AST 15 - 41 U/L  28  36   ALT 0 - 44 U/L  23  26    ASSESSMENT & PLAN:  A 73 y.o. 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  In clinic today, I went over her CT scan images with her, for which there predominantly appears to be linear postradiation changes in her right lung.  I see no radiographic evidence of disease recurrence.  She will proceed with her 5th cycle of durvalumab immunotherapy today.  Of note, I will also give her 1L of fluids to further correct any residual dehydration she may have left.  Otherwise, I will see this patient back in 4 weeks before she heads into her 5th  cycle of maintenance durvalumab immunotherapy.  The patient understands all the plans discussed today and is in agreement with them.  Inetha Maret Kirby Funk, MD

## 2023-04-19 ENCOUNTER — Inpatient Hospital Stay: Payer: No Typology Code available for payment source | Attending: Internal Medicine | Admitting: Oncology

## 2023-04-19 ENCOUNTER — Other Ambulatory Visit: Payer: Self-pay | Admitting: Pharmacist

## 2023-04-19 ENCOUNTER — Encounter: Payer: Self-pay | Admitting: Oncology

## 2023-04-19 ENCOUNTER — Inpatient Hospital Stay: Payer: No Typology Code available for payment source

## 2023-04-19 VITALS — BP 104/66 | HR 66 | Resp 20

## 2023-04-19 VITALS — BP 121/70 | HR 89 | Temp 97.5°F | Resp 16 | Ht 67.0 in | Wt 126.1 lb

## 2023-04-19 DIAGNOSIS — C3491 Malignant neoplasm of unspecified part of right bronchus or lung: Secondary | ICD-10-CM

## 2023-04-19 DIAGNOSIS — C342 Malignant neoplasm of middle lobe, bronchus or lung: Secondary | ICD-10-CM | POA: Diagnosis not present

## 2023-04-19 DIAGNOSIS — D509 Iron deficiency anemia, unspecified: Secondary | ICD-10-CM | POA: Diagnosis not present

## 2023-04-19 DIAGNOSIS — E876 Hypokalemia: Secondary | ICD-10-CM

## 2023-04-19 DIAGNOSIS — E860 Dehydration: Secondary | ICD-10-CM

## 2023-04-19 DIAGNOSIS — Z5112 Encounter for antineoplastic immunotherapy: Secondary | ICD-10-CM | POA: Insufficient documentation

## 2023-04-19 DIAGNOSIS — Z79899 Other long term (current) drug therapy: Secondary | ICD-10-CM | POA: Diagnosis not present

## 2023-04-19 LAB — CMP (CANCER CENTER ONLY)
ALT: 16 U/L (ref 0–44)
AST: 28 U/L (ref 15–41)
Albumin: 3.6 g/dL (ref 3.5–5.0)
Alkaline Phosphatase: 91 U/L (ref 38–126)
Anion gap: 12 (ref 5–15)
BUN: 15 mg/dL (ref 8–23)
CO2: 23 mmol/L (ref 22–32)
Calcium: 9 mg/dL (ref 8.9–10.3)
Chloride: 101 mmol/L (ref 98–111)
Creatinine: 0.84 mg/dL (ref 0.44–1.00)
GFR, Estimated: >60 mL/min (ref >60)
Glucose, Bld: 111 mg/dL — ABNORMAL HIGH (ref 70–99)
Potassium: 3.8 mmol/L (ref 3.5–5.1)
Sodium: 136 mmol/L (ref 135–145)
Total Bilirubin: 0.4 mg/dL (ref 0.0–1.2)
Total Protein: 6.8 g/dL (ref 6.5–8.1)

## 2023-04-19 LAB — CBC WITH DIFFERENTIAL (CANCER CENTER ONLY)
Abs Immature Granulocytes: 0.02 10*3/uL (ref 0.00–0.07)
Basophils Absolute: 0 10*3/uL (ref 0.0–0.1)
Basophils Relative: 0 %
Eosinophils Absolute: 0.1 10*3/uL (ref 0.0–0.5)
Eosinophils Relative: 1 %
HCT: 33.8 % — ABNORMAL LOW (ref 36.0–46.0)
Hemoglobin: 11 g/dL — ABNORMAL LOW (ref 12.0–15.0)
Immature Granulocytes: 0 %
Lymphocytes Relative: 14 %
Lymphs Abs: 1.2 10*3/uL (ref 0.7–4.0)
MCH: 26.3 pg (ref 26.0–34.0)
MCHC: 32.5 g/dL (ref 30.0–36.0)
MCV: 80.7 fL (ref 80.0–100.0)
Monocytes Absolute: 0.8 10*3/uL (ref 0.1–1.0)
Monocytes Relative: 10 %
Neutro Abs: 6.1 10*3/uL (ref 1.7–7.7)
Neutrophils Relative %: 75 %
Platelet Count: 319 10*3/uL (ref 150–400)
RBC: 4.19 MIL/uL (ref 3.87–5.11)
RDW: 14.6 % (ref 11.5–15.5)
WBC Count: 8.2 10*3/uL (ref 4.0–10.5)
nRBC: 0 % (ref 0.0–0.2)
nRBC: 0 /100{WBCs}

## 2023-04-19 LAB — TSH: TSH: 2.398 u[IU]/mL (ref 0.350–4.500)

## 2023-04-19 MED ORDER — SODIUM CHLORIDE 0.9 % IV SOLN
1500.0000 mg | Freq: Once | INTRAVENOUS | Status: AC
  Administered 2023-04-19: 1500 mg via INTRAVENOUS
  Filled 2023-04-19: qty 30

## 2023-04-19 MED ORDER — HEPARIN SOD (PORK) LOCK FLUSH 100 UNIT/ML IV SOLN
500.0000 [IU] | Freq: Once | INTRAVENOUS | Status: AC | PRN
  Administered 2023-04-19: 500 [IU]

## 2023-04-19 MED ORDER — SODIUM CHLORIDE 0.9% FLUSH
10.0000 mL | INTRAVENOUS | Status: DC | PRN
  Administered 2023-04-19: 10 mL

## 2023-04-19 MED ORDER — IRON SUCROSE 20 MG/ML IV SOLN
200.0000 mg | Freq: Once | INTRAVENOUS | Status: AC
Start: 2023-04-19 — End: 2023-04-19
  Administered 2023-04-19: 200 mg via INTRAVENOUS
  Filled 2023-04-19: qty 10

## 2023-04-19 MED ORDER — SODIUM CHLORIDE 0.9 % IV SOLN
Freq: Once | INTRAVENOUS | Status: AC
Start: 2023-04-19 — End: 2023-04-19

## 2023-04-19 NOTE — Patient Instructions (Signed)
 Durvalumab Injection What is this medication? DURVALUMAB (dur VAL ue mab) treats some types of cancer. It works by helping your immune system slow or stop the spread of cancer cells. It is a monoclonal antibody. This medicine may be used for other purposes; ask your health care provider or pharmacist if you have questions. COMMON BRAND NAME(S): IMFINZI What should I tell my care team before I take this medication? They need to know if you have any of these conditions: Allogeneic stem cell transplant (uses someone else's stem cells) Autoimmune diseases, such as Crohn disease, ulcerative colitis, lupus History of chest radiation Nervous system problems, such as Guillain-Barre syndrome, myasthenia gravis Organ transplant An unusual or allergic reaction to durvalumab, other medications, foods, dyes, or preservatives Pregnant or trying to get pregnant Breast-feeding How should I use this medication? This medication is infused into a vein. It is given by your care team in a hospital or clinic setting. A special MedGuide will be given to you before each treatment. Be sure to read this information carefully each time. Talk to your care team about the use of this medication in children. Special care may be needed. Overdosage: If you think you have taken too much of this medicine contact a poison control center or emergency room at once. NOTE: This medicine is only for you. Do not share this medicine with others. What if I miss a dose? Keep appointments for follow-up doses. It is important not to miss your dose. Call your care team if you are unable to keep an appointment. What may interact with this medication? Interactions have not been studied. This list may not describe all possible interactions. Give your health care provider a list of all the medicines, herbs, non-prescription drugs, or dietary supplements you use. Also tell them if you smoke, drink alcohol, or use illegal drugs. Some items may  interact with your medicine. What should I watch for while using this medication? Your condition will be monitored carefully while you are receiving this medication. You may need blood work while taking this medication. This medication may cause serious skin reactions. They can happen weeks to months after starting the medication. Contact your care team right away if you notice fevers or flu-like symptoms with a rash. The rash may be red or purple and then turn into blisters or peeling of the skin. You may also notice a red rash with swelling of the face, lips, or lymph nodes in your neck or under your arms. Tell your care team right away if you have any change in your eyesight. Talk to your care team if you may be pregnant. Serious birth defects can occur if you take this medication during pregnancy and for 3 months after the last dose. You will need a negative pregnancy test before starting this medication. Contraception is recommended while taking this medication and for 3 months after the last dose. Your care team can help you find the option that works for you. Do not breastfeed while taking this medication and for 3 months after the last dose. What side effects may I notice from receiving this medication? Side effects that you should report to your care team as soon as possible: Allergic reactions--skin rash, itching, hives, swelling of the face, lips, tongue, or throat Dry cough, shortness of breath or trouble breathing Eye pain, redness, irritation, or discharge with blurry or decreased vision Heart muscle inflammation--unusual weakness or fatigue, shortness of breath, chest pain, fast or irregular heartbeat, dizziness, swelling of the  ankles, feet, or hands Hormone gland problems--headache, sensitivity to light, unusual weakness or fatigue, dizziness, fast or irregular heartbeat, increased sensitivity to cold or heat, excessive sweating, constipation, hair loss, increased thirst or amount of  urine, tremors or shaking, irritability Infusion reactions--chest pain, shortness of breath or trouble breathing, feeling faint or lightheaded Kidney injury (glomerulonephritis)--decrease in the amount of urine, red or dark brown urine, foamy or bubbly urine, swelling of the ankles, hands, or feet Liver injury--right upper belly pain, loss of appetite, nausea, light-colored stool, dark yellow or brown urine, yellowing skin or eyes, unusual weakness or fatigue Pain, tingling, or numbness in the hands or feet, muscle weakness, change in vision, confusion or trouble speaking, loss of balance or coordination, trouble walking, seizures Rash, fever, and swollen lymph nodes Redness, blistering, peeling, or loosening of the skin, including inside the mouth Sudden or severe stomach pain, bloody diarrhea, fever, nausea, vomiting Side effects that usually do not require medical attention (report these to your care team if they continue or are bothersome): Bone, joint, or muscle pain Diarrhea Fatigue Loss of appetite Nausea Skin rash This list may not describe all possible side effects. Call your doctor for medical advice about side effects. You may report side effects to FDA at 1-800-FDA-1088. Where should I keep my medication? This medication is given in a hospital or clinic. It will not be stored at home. NOTE: This sheet is a summary. It may not cover all possible information. If you have questions about this medicine, talk to your doctor, pharmacist, or health care provider.  2024 Elsevier/Gold Standard (2021-06-21 00:00:00)

## 2023-04-20 ENCOUNTER — Other Ambulatory Visit: Payer: Self-pay

## 2023-04-20 LAB — T4: T4, Total: 9.3 ug/dL (ref 4.5–12.0)

## 2023-04-23 ENCOUNTER — Inpatient Hospital Stay: Payer: No Typology Code available for payment source | Attending: Internal Medicine

## 2023-04-23 ENCOUNTER — Encounter (HOSPITAL_COMMUNITY): Payer: Self-pay | Admitting: Gastroenterology

## 2023-04-23 VITALS — BP 97/61 | HR 80 | Temp 97.5°F | Resp 20 | Ht 67.0 in | Wt 126.0 lb

## 2023-04-23 DIAGNOSIS — C342 Malignant neoplasm of middle lobe, bronchus or lung: Secondary | ICD-10-CM | POA: Insufficient documentation

## 2023-04-23 DIAGNOSIS — Z79899 Other long term (current) drug therapy: Secondary | ICD-10-CM | POA: Insufficient documentation

## 2023-04-23 DIAGNOSIS — Z5112 Encounter for antineoplastic immunotherapy: Secondary | ICD-10-CM | POA: Insufficient documentation

## 2023-04-23 DIAGNOSIS — E876 Hypokalemia: Secondary | ICD-10-CM

## 2023-04-23 DIAGNOSIS — E860 Dehydration: Secondary | ICD-10-CM

## 2023-04-23 MED ORDER — HEPARIN SOD (PORK) LOCK FLUSH 100 UNIT/ML IV SOLN: 500.0000 [IU] | Freq: Once | INTRAVENOUS | Status: DC | PRN

## 2023-04-23 MED ORDER — SODIUM CHLORIDE 0.9% FLUSH: 10.0000 mL | Freq: Once | INTRAVENOUS | Status: DC | PRN

## 2023-04-23 MED ORDER — IRON SUCROSE 20 MG/ML IV SOLN
200.0000 mg | Freq: Once | INTRAVENOUS | Status: AC
  Administered 2023-04-23: 200 mg via INTRAVENOUS
  Filled 2023-04-23: qty 10

## 2023-04-23 MED ORDER — SODIUM CHLORIDE 0.9 % IV SOLN: INTRAVENOUS | Status: DC

## 2023-04-23 NOTE — Progress Notes (Signed)
 Attempted to obtain medical history for pre op call via telephone, unable to reach at this time. HIPAA compliant voicemail message left requesting return call to pre surgical testing department.

## 2023-04-23 NOTE — Patient Instructions (Signed)

## 2023-04-24 ENCOUNTER — Inpatient Hospital Stay: Payer: No Typology Code available for payment source

## 2023-04-24 VITALS — BP 111/60 | HR 90 | Temp 97.5°F | Resp 20 | Ht 67.0 in | Wt 127.8 lb

## 2023-04-24 DIAGNOSIS — E860 Dehydration: Secondary | ICD-10-CM

## 2023-04-24 DIAGNOSIS — E876 Hypokalemia: Secondary | ICD-10-CM

## 2023-04-24 DIAGNOSIS — Z5112 Encounter for antineoplastic immunotherapy: Secondary | ICD-10-CM | POA: Diagnosis not present

## 2023-04-24 MED ORDER — HEPARIN SOD (PORK) LOCK FLUSH 100 UNIT/ML IV SOLN
500.0000 [IU] | Freq: Once | INTRAVENOUS | Status: AC | PRN
  Administered 2023-04-24: 500 [IU]

## 2023-04-24 MED ORDER — SODIUM CHLORIDE 0.9 % IV SOLN: INTRAVENOUS | Status: DC

## 2023-04-24 MED ORDER — IRON SUCROSE 20 MG/ML IV SOLN
200.0000 mg | Freq: Once | INTRAVENOUS | Status: AC
  Administered 2023-04-24: 200 mg via INTRAVENOUS
  Filled 2023-04-24: qty 10

## 2023-04-24 MED ORDER — SODIUM CHLORIDE 0.9% FLUSH
10.0000 mL | Freq: Once | INTRAVENOUS | Status: AC | PRN
  Administered 2023-04-24: 10 mL

## 2023-04-24 NOTE — Patient Instructions (Signed)

## 2023-04-25 ENCOUNTER — Encounter: Payer: Self-pay | Admitting: Oncology

## 2023-04-25 ENCOUNTER — Inpatient Hospital Stay: Payer: No Typology Code available for payment source

## 2023-04-25 ENCOUNTER — Telehealth: Payer: Self-pay

## 2023-04-25 VITALS — BP 121/70 | HR 89 | Temp 97.9°F | Resp 20 | Wt 128.0 lb

## 2023-04-25 DIAGNOSIS — E876 Hypokalemia: Secondary | ICD-10-CM

## 2023-04-25 DIAGNOSIS — C3491 Malignant neoplasm of unspecified part of right bronchus or lung: Secondary | ICD-10-CM

## 2023-04-25 DIAGNOSIS — Z5112 Encounter for antineoplastic immunotherapy: Secondary | ICD-10-CM | POA: Diagnosis not present

## 2023-04-25 DIAGNOSIS — E860 Dehydration: Secondary | ICD-10-CM

## 2023-04-25 MED ORDER — HEPARIN SOD (PORK) LOCK FLUSH 100 UNIT/ML IV SOLN
500.0000 [IU] | Freq: Once | INTRAVENOUS | Status: DC | PRN
Start: 2023-04-25 — End: 2023-04-25

## 2023-04-25 MED ORDER — SODIUM CHLORIDE 0.9 % IV SOLN: 1500.0000 mg | Freq: Once | INTRAVENOUS | Status: DC

## 2023-04-25 MED ORDER — IRON SUCROSE 20 MG/ML IV SOLN
200.0000 mg | Freq: Once | INTRAVENOUS | Status: AC
  Administered 2023-04-25: 200 mg via INTRAVENOUS
  Filled 2023-04-25: qty 10

## 2023-04-25 MED ORDER — SODIUM CHLORIDE 0.9 % IV SOLN: Freq: Once | INTRAVENOUS | Status: DC

## 2023-04-25 MED ORDER — SODIUM CHLORIDE 0.9 % IV SOLN: INTRAVENOUS | Status: DC

## 2023-04-25 MED ORDER — HEPARIN SOD (PORK) LOCK FLUSH 100 UNIT/ML IV SOLN: 500.0000 [IU] | Freq: Once | INTRAVENOUS | Status: DC | PRN

## 2023-04-25 MED ORDER — SODIUM CHLORIDE 0.9% FLUSH: 10.0000 mL | Freq: Once | INTRAVENOUS | Status: DC | PRN

## 2023-04-25 MED ORDER — SODIUM CHLORIDE 0.9% FLUSH: 10.0000 mL | INTRAVENOUS | Status: DC | PRN

## 2023-04-25 NOTE — Telephone Encounter (Signed)
 Procedure:ENDO Procedure date: 04/30/23 Procedure location: [facility] Arrival Time: 8am Spoke with the patient Y/N: y Any prep concerns? n  Has the patient obtained the prep from the pharmacy ? n Do you have a care partner and transportation: y Any additional concerns? n

## 2023-04-26 ENCOUNTER — Inpatient Hospital Stay: Payer: No Typology Code available for payment source

## 2023-04-26 VITALS — BP 124/71 | HR 80 | Temp 97.7°F | Resp 20

## 2023-04-26 DIAGNOSIS — Z5112 Encounter for antineoplastic immunotherapy: Secondary | ICD-10-CM | POA: Diagnosis not present

## 2023-04-26 DIAGNOSIS — E876 Hypokalemia: Secondary | ICD-10-CM

## 2023-04-26 DIAGNOSIS — E860 Dehydration: Secondary | ICD-10-CM

## 2023-04-26 MED ORDER — IRON SUCROSE 20 MG/ML IV SOLN
200.0000 mg | Freq: Once | INTRAVENOUS | Status: AC
  Administered 2023-04-26: 200 mg via INTRAVENOUS
  Filled 2023-04-26: qty 10

## 2023-04-26 MED ORDER — HEPARIN SOD (PORK) LOCK FLUSH 100 UNIT/ML IV SOLN
500.0000 [IU] | Freq: Once | INTRAVENOUS | Status: AC | PRN
  Administered 2023-04-26: 500 [IU]

## 2023-04-26 MED ORDER — SODIUM CHLORIDE 0.9% FLUSH
10.0000 mL | Freq: Once | INTRAVENOUS | Status: AC | PRN
  Administered 2023-04-26: 10 mL

## 2023-04-26 MED ORDER — SODIUM CHLORIDE 0.9 % IV SOLN: INTRAVENOUS | Status: DC

## 2023-04-26 NOTE — Patient Instructions (Signed)

## 2023-04-28 NOTE — Anesthesia Preprocedure Evaluation (Signed)
 Anesthesia Evaluation  Patient identified by MRN, date of birth, ID band Patient awake    Reviewed: Allergy & Precautions, NPO status , Patient's Chart, lab work & pertinent test results  History of Anesthesia Complications (+) PONV and history of anesthetic complications  Airway Mallampati: I  TM Distance: >3 FB Neck ROM: Full    Dental  (+) Poor Dentition, Missing, Chipped,    Pulmonary COPD,  COPD inhaler, former smoker Right lung CA   Pulmonary exam normal breath sounds clear to auscultation       Cardiovascular + CAD and + Peripheral Vascular Disease  Normal cardiovascular exam Rhythm:Regular Rate:Normal  TTE 2022  1. Left ventricular ejection fraction, by estimation, is 60 to 65%. The  left ventricle has normal function. The left ventricle has no regional  wall motion abnormalities. Left ventricular diastolic parameters are  consistent with Grade I diastolic  dysfunction (impaired relaxation).   2. Right ventricular systolic function is normal. The right ventricular  size is normal. There is normal pulmonary artery systolic pressure.   3. The mitral valve is normal in structure. Mild mitral valve  regurgitation. No evidence of mitral stenosis.   4. The aortic valve is normal in structure. Aortic valve regurgitation is  not visualized. No aortic stenosis is present.   5. The inferior vena cava is normal in size with greater than 50%  respiratory variability, suggesting right atrial pressure of 3 mmHg.   Stress Test 2022  The left ventricular ejection fraction is hyperdynamic (>65%).  Nuclear stress EF: 76%.  There was no ST segment deviation noted during stress.  The study is normal.  This is a low risk study.    Neuro/Psych  PSYCHIATRIC DISORDERS Anxiety     negative neurological ROS     GI/Hepatic Neg liver ROS, hiatal hernia,GERD  ,,  Endo/Other  negative endocrine ROS    Renal/GU negative Renal ROS   negative genitourinary   Musculoskeletal  (+) Arthritis ,    Abdominal   Peds  Hematology negative hematology ROS (+)   Anesthesia Other Findings   Reproductive/Obstetrics                             Anesthesia Physical Anesthesia Plan  ASA: 3  Anesthesia Plan: MAC   Post-op Pain Management:    Induction: Intravenous  PONV Risk Score and Plan: Propofol infusion and Treatment may vary due to age or medical condition  Airway Management Planned: Natural Airway  Additional Equipment:   Intra-op Plan:   Post-operative Plan:   Informed Consent: I have reviewed the patients History and Physical, chart, labs and discussed the procedure including the risks, benefits and alternatives for the proposed anesthesia with the patient or authorized representative who has indicated his/her understanding and acceptance.     Dental advisory given  Plan Discussed with: CRNA  Anesthesia Plan Comments:        Anesthesia Quick Evaluation

## 2023-04-30 ENCOUNTER — Encounter (HOSPITAL_COMMUNITY): Payer: Self-pay | Admitting: Gastroenterology

## 2023-04-30 ENCOUNTER — Ambulatory Visit (HOSPITAL_BASED_OUTPATIENT_CLINIC_OR_DEPARTMENT_OTHER): Payer: Self-pay | Admitting: Anesthesiology

## 2023-04-30 ENCOUNTER — Ambulatory Visit (HOSPITAL_COMMUNITY): Payer: Self-pay | Admitting: Anesthesiology

## 2023-04-30 ENCOUNTER — Other Ambulatory Visit: Payer: Self-pay

## 2023-04-30 ENCOUNTER — Ambulatory Visit (HOSPITAL_COMMUNITY)
Admission: RE | Admit: 2023-04-30 | Discharge: 2023-04-30 | Disposition: A | Discharge status: Home / Self Care | Payer: Medicare HMO | Attending: Gastroenterology | Admitting: Gastroenterology

## 2023-04-30 ENCOUNTER — Encounter (HOSPITAL_COMMUNITY)
Admission: RE | Disposition: A | Discharge status: Home / Self Care | Payer: Self-pay | Source: Home / Self Care | Attending: Gastroenterology

## 2023-04-30 DIAGNOSIS — K222 Esophageal obstruction: Secondary | ICD-10-CM

## 2023-04-30 DIAGNOSIS — K449 Diaphragmatic hernia without obstruction or gangrene: Secondary | ICD-10-CM | POA: Insufficient documentation

## 2023-04-30 DIAGNOSIS — M1990 Unspecified osteoarthritis, unspecified site: Secondary | ICD-10-CM | POA: Insufficient documentation

## 2023-04-30 DIAGNOSIS — Z9221 Personal history of antineoplastic chemotherapy: Secondary | ICD-10-CM | POA: Diagnosis not present

## 2023-04-30 DIAGNOSIS — Z87891 Personal history of nicotine dependence: Secondary | ICD-10-CM | POA: Insufficient documentation

## 2023-04-30 DIAGNOSIS — I700 Atherosclerosis of aorta: Secondary | ICD-10-CM | POA: Insufficient documentation

## 2023-04-30 DIAGNOSIS — J449 Chronic obstructive pulmonary disease, unspecified: Secondary | ICD-10-CM | POA: Insufficient documentation

## 2023-04-30 DIAGNOSIS — D649 Anemia, unspecified: Secondary | ICD-10-CM | POA: Diagnosis not present

## 2023-04-30 DIAGNOSIS — R1310 Dysphagia, unspecified: Secondary | ICD-10-CM

## 2023-04-30 DIAGNOSIS — K317 Polyp of stomach and duodenum: Secondary | ICD-10-CM

## 2023-04-30 DIAGNOSIS — Z79899 Other long term (current) drug therapy: Secondary | ICD-10-CM | POA: Diagnosis not present

## 2023-04-30 DIAGNOSIS — Z902 Acquired absence of lung [part of]: Secondary | ICD-10-CM | POA: Diagnosis not present

## 2023-04-30 DIAGNOSIS — Z85118 Personal history of other malignant neoplasm of bronchus and lung: Secondary | ICD-10-CM | POA: Insufficient documentation

## 2023-04-30 DIAGNOSIS — Z7901 Long term (current) use of anticoagulants: Secondary | ICD-10-CM | POA: Diagnosis not present

## 2023-04-30 DIAGNOSIS — I739 Peripheral vascular disease, unspecified: Secondary | ICD-10-CM | POA: Insufficient documentation

## 2023-04-30 DIAGNOSIS — I2510 Atherosclerotic heart disease of native coronary artery without angina pectoris: Secondary | ICD-10-CM | POA: Insufficient documentation

## 2023-04-30 DIAGNOSIS — E785 Hyperlipidemia, unspecified: Secondary | ICD-10-CM | POA: Diagnosis not present

## 2023-04-30 DIAGNOSIS — Z923 Personal history of irradiation: Secondary | ICD-10-CM | POA: Insufficient documentation

## 2023-04-30 DIAGNOSIS — R112 Nausea with vomiting, unspecified: Secondary | ICD-10-CM

## 2023-04-30 DIAGNOSIS — Z681 Body mass index (BMI) 19 or less, adult: Secondary | ICD-10-CM | POA: Diagnosis not present

## 2023-04-30 DIAGNOSIS — K219 Gastro-esophageal reflux disease without esophagitis: Secondary | ICD-10-CM | POA: Insufficient documentation

## 2023-04-30 DIAGNOSIS — Z86711 Personal history of pulmonary embolism: Secondary | ICD-10-CM | POA: Insufficient documentation

## 2023-04-30 DIAGNOSIS — R634 Abnormal weight loss: Secondary | ICD-10-CM | POA: Diagnosis not present

## 2023-04-30 DIAGNOSIS — R1319 Other dysphagia: Secondary | ICD-10-CM

## 2023-04-30 HISTORY — PX: ESOPHAGOGASTRODUODENOSCOPY (EGD) WITH PROPOFOL: SHX5813

## 2023-04-30 HISTORY — PX: BALLOON DILATION: SHX5330

## 2023-04-30 SURGERY — ESOPHAGOGASTRODUODENOSCOPY (EGD) WITH PROPOFOL
Anesthesia: Monitor Anesthesia Care

## 2023-04-30 MED ORDER — PROPOFOL 1000 MG/100ML IV EMUL
INTRAVENOUS | Status: AC
  Filled 2023-04-30: qty 100

## 2023-04-30 MED ORDER — PROPOFOL 500 MG/50ML IV EMUL
INTRAVENOUS | Status: DC | PRN
  Administered 2023-04-30: 125 ug/kg/min via INTRAVENOUS
  Administered 2023-04-30: 2 mg via INTRAVENOUS
  Administered 2023-04-30: 50 mg via INTRAVENOUS
  Administered 2023-04-30: 40 mg via INTRAVENOUS

## 2023-04-30 MED ORDER — LIDOCAINE 2% (20 MG/ML) 5 ML SYRINGE
INTRAMUSCULAR | Status: DC | PRN
  Administered 2023-04-30: 50 mg via INTRAVENOUS

## 2023-04-30 MED ORDER — SODIUM CHLORIDE 0.9 % IV SOLN: INTRAVENOUS | Status: DC

## 2023-04-30 SURGICAL SUPPLY — 14 items

## 2023-04-30 NOTE — Discharge Instructions (Signed)

## 2023-04-30 NOTE — Transfer of Care (Signed)
 Immediate Anesthesia Transfer of Care Note  Patient: [name]  Procedure(s) Performed: ESOPHAGOGASTRODUODENOSCOPY (EGD) WITH PROPOFOL BALLOON DILATION  Patient Location: PACU  Anesthesia Type:MAC  Level of Consciousness: awake, alert , oriented, and patient cooperative  Airway & Oxygen Therapy: Patient Spontanous Breathing and Patient connected to face mask oxygen  Post-op Assessment: Report given to RN, Post -op Vital signs reviewed and stable, and Patient moving all extremities  Post vital signs: Reviewed and stable  Last Vitals:  Vitals Value Taken Time  BP    Temp    Pulse    Resp    SpO2      Last Pain:  Vitals:   04/30/23 0848  TempSrc: Temporal  PainSc: 0-No pain         Complications: No notable events documented.

## 2023-04-30 NOTE — Anesthesia Procedure Notes (Signed)
 Procedure Name: MAC Date/Time: 04/30/2023 9:27 AM  Performed by: Elisabeth Cara, CRNAPre-anesthesia Checklist: Patient identified, Emergency Drugs available, Suction available, Patient being monitored and Timeout performed Patient Re-evaluated:Patient Re-evaluated prior to induction Oxygen Delivery Method: Simple face mask Placement Confirmation: positive ETCO2 Dental Injury: Teeth and Oropharynx as per pre-operative assessment

## 2023-04-30 NOTE — H&P (Signed)
 11/16/2022 [name] [id number] 01-14-1951     Chief Complaint: Difficulty swallowing    History of Present Illness: [name] is a 73 year old female with a past medical history of arthritis, hyperlipidemia, coronary calcifications per CT, GERD, colon polyps, COPD, nonsmall cell lung cancer, adenocarcinoma s/p right middle lobectomy 03/29/20 with recurrence 5/24 in mediastinal lymph nodes treated with chemo and radiation. She is known by Dr. Chales Abrahams. 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 She is accompanied by her son.    She underwent an EGD 10/07/2022 which showed moderate proximal/mid esophagitis without bleeding, likely from radiation. Esophageal biopsies showed severe acute nonspecific esophagitis with ulceration. Following her EGD, she could not swallow her oral medications including Omeprazole and her oral food intake remained limited, tolerated small portions of soups, grits and gravy. She stated everything tastes bad, metallic taste. Over the past few weeks, she is having less dysphagia and odynophagia and is tolerating soft chicken, pasta and creamed potatoes. She is drinking a few ounces of Fair Life protein supplement. She did not take Pantoprazole 40mg  po bid post EGD as prescribed as she preferred to take Omeprazole 20mg  po bid. She took viscous Lidocaine for a few weeks. She remains on Sucralfate 1gm slurry tid 30 minutes before meal. She uses Phenergan po or PR for nausea and she sometimes uses Zofran which she stated recently works better than when used in the past. She endorses losing 30lbs within the past 6 months.        Latest Ref Rng & Units 11/14/2022    1:20 PM 10/19/2022    4:26 AM 10/18/2022    4:40 AM  CBC  WBC 4.0 - 10.5 K/uL 4.8  4.8  4.1   Hemoglobin 12.0 - 15.0 g/dL 40.9  81.1  91.4   Hematocrit 36.0 - 46.0 % 36.0  33.3  30.9   Platelets 150 - 400 K/uL 292  169  124           Latest  Ref Rng & Units 11/14/2022    1:20 PM 10/19/2022    4:26 AM 10/18/2022    4:40 AM  CMP  Glucose 70 - 99 mg/dL 782  86  94   BUN 8 - 23 mg/dL 6  <5  <5   Creatinine 0.44 - 1.00 mg/dL 9.56  2.13  0.86   Sodium 135 - 145 mmol/L 133  131  130   Potassium 3.5 - 5.1 mmol/L 2.6  3.8  3.4   Chloride 98 - 111 mmol/L 93  96  97   CO2 22 - 32 mmol/L 28  25  24    Calcium 8.9 - 10.3 mg/dL 7.8  8.1  7.7   Total Protein 6.5 - 8.1 g/dL 5.8       Total Bilirubin 0.3 - 1.2 mg/dL 0.7       Alkaline Phos 38 - 126 U/L 89       AST 15 - 41 U/L 20       ALT 0 - 44 U/L 13         Chest CT with contrast 10/18/2022: Status post right middle lobectomy.   Small mediastinal and right hilar nodal metastases, grossly unchanged.   No evidence of new/progressive metastatic disease.   Aortic Atherosclerosis (ICD10-I70.0) and Emphysema (ICD10-J43.9).   CTAP without contrast 10/16/2022: FINDINGS: Lower chest: No acute findings.  Coronary artery calcifications.   Hepatobiliary: No focal liver  abnormality is seen. No gallstones, gallbladder wall thickening, or biliary dilatation.   Pancreas: Unremarkable. No pancreatic ductal dilatation or surrounding inflammatory changes.   Spleen: Normal in size without focal abnormality.   Adrenals/Urinary Tract: Adrenal glands are unremarkable. Kidneys are normal, without renal calculi, focal lesion, or hydronephrosis. Bladder is unremarkable.   Stomach/Bowel: Stomach is within normal limits. Appendix appears normal. No evidence of bowel wall thickening, distention, or inflammatory changes.   Vascular/Lymphatic: Aortic atherosclerosis. No enlarged abdominal or pelvic lymph nodes.   Reproductive: Uterus and bilateral adnexa are unremarkable. Vaginal pessary in place.   Other: No abdominal wall hernia or abnormality. No abdominopelvic ascites.   Musculoskeletal: No acute or significant osseous findings.   IMPRESSION: 1. No acute abdominopelvic findings. 2.  Coronary artery calcifications.  Aortic Atherosclerosis (ICD10-I70.0).     GI PROCEDURES:   EGD 10/07/2022: - Mod proximal/mid esophagitis with no bleeding (likely radiation induced). Biopsied. - Z-line regular, 35 cm from the incisors. - 3 cm hiatal hernia. - Multiple gastric polyps. Biopsied. - Normal examined duodenum. Biopsied   A. DUODENAL BIOPSY:  - Duodenal mucosa with no specific histopathologic changes  - Negative for increased intraepithelial lymphocytes or villous  architectural changes   B. GASTRIC BIOPSY:  - Gastric antral and oxyntic mucosa with no specific histopathologic  changes  - Helicobacter pylori-like organisms are not identified on routine HE  stain   C. GASTRIC POLYP, BIOPSY:  - Fundic gland polyp(s)  - Negative for dysplasia   D. ESOPHAGEAL BIOPSY:  - Severe acute nonspecific esophagitis with ulceration, see comment    Colonoscopy 01/18/2017: 6mm sessile polyp removed from the proximal ascending colon Two 6 and 8 mm polyps removed from the distal ascending colon near the hepatic flexure.     Medications Ordered Prior to Encounter             Current Outpatient Medications on File Prior to Visit  Medication Sig Dispense Refill   alum & mag hydroxide-simeth (MAALOX MAX) 400-400-40 MG/5ML suspension Take 15 mLs by mouth every 6 (six) hours as needed for indigestion. 355 mL 1   Fluticasone-Umeclidin-Vilant (TRELEGY ELLIPTA) 100-62.5-25 MCG/ACT AEPB Inhale 1 puff into the lungs daily. (Patient taking differently: Inhale 1 puff into the lungs daily as needed (For shortness of breath).) 2 each 0   lidocaine (XYLOCAINE) 2 % solution Use as directed 15 mLs in the mouth or throat every 6 (six) hours as needed for mouth pain. 100 mL 0   lidocaine-prilocaine (EMLA) cream Apply 1 Application topically as needed. Apply 1 hour prior to lab appt. Cover with plastic wrap, 30 g 0   mirtazapine (REMERON) 30 MG tablet Take 1 tablet (30 mg total) by mouth at bedtime. 30  tablet 2   mupirocin ointment (BACTROBAN) 2 % Apply 1 application topically 2 (two) times daily. (Patient taking differently: Apply 1 application  topically 2 (two) times daily as needed (wound care).) 22 g 2   omeprazole (PRILOSEC) 20 MG capsule Take 20 mg by mouth 2 (two) times daily before a meal. Will start pantoprazole after pt finishes omeprazole.       ondansetron (ZOFRAN-ODT) 8 MG disintegrating tablet Take 1 tablet (8 mg total) by mouth every 8 (eight) hours as needed for nausea or vomiting. 30 tablet 2   polyethylene glycol (MIRALAX / GLYCOLAX) 17 g packet Take 17 g by mouth daily as needed for moderate constipation.       prochlorperazine (COMPAZINE) 25 MG suppository Place 1 suppository (25 mg total)  rectally every 12 (twelve) hours. 30 suppository 1   sucralfate (CARAFATE) 1 GM/10ML suspension Take 1 g by mouth 4 (four) times daily -  with meals and at bedtime.       pantoprazole (PROTONIX) 40 MG tablet Take 1 tablet (40 mg total) by mouth 2 (two) times daily. 60 tablet 0    No current facility-administered medications on file prior to visit.      Allergies         Allergies  Allergen Reactions   Codeine Hives   Surgical Lubricant Hives      Current Medications, Allergies, Past Medical History, Past Surgical History, Family History and Social History were reviewed in [employer] record.   Review of Systems:   Constitutional: Negative for fever, sweats, chills or weight loss.  Respiratory: Negative for shortness of breath.   Cardiovascular: Negative for chest pain, palpitations and leg swelling.  Gastrointestinal: See HPI.  Musculoskeletal: + Back pain. Neurological: Negative for dizziness, headaches or paresthesias.    Physical Exam: BP 124/84   Pulse (!) 120   Ht 5\' 7"  (1.702 m)   Wt 130 lb 8 oz (59.2 kg)   SpO2 (!) 88%   BMI 20.44 kg/m         Wt Readings from Last 3 Encounters:  11/16/22 130 lb 8 oz (59.2 kg)  11/14/22 131 lb 4.8 oz  (59.6 kg)  11/13/22 130 lb 3.2 oz (59.1 kg)    General: Chronically ill appearing 73 year old female presents in a wheelchair in no acute distress. Head: Normocephalic and atraumatic. Eyes: No scleral icterus. Conjunctiva pink . Ears: Normal auditory acuity. Mouth: Dentition intact. No ulcers or lesions.  Lungs: Clear throughout to auscultation. Heart: Regular rate and rhythm, no murmur. Abdomen: Soft, nontender and nondistended. No masses or hepatomegaly. Normal bowel sounds x 4 quadrants.  Rectal: Deferred.  Musculoskeletal: Symmetrical with no gross deformities. Extremities: No edema. Neurological: Alert oriented x 4. No focal deficits.  Psychological: Alert and cooperative. 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  EGD 10/07/2022 showed moderate proximal/mid esophagitis without bleeding, likely from radiation. Esophageal biopsies showed severe acute nonspecific esophagitis with ulceration. She was unable to swallow any medication including PPI x 3 to 4 weeks post EGD. Her esophagitis symptoms have improved over the past few weeks and she is consistently taking Omeprazole 20mg  po  bid, Carafate slurry 1 gm po tid. She is tolerate small quantities of soft foods.  -Increase Omeprazole 20mg  to two capsule (40mg ) po bid to be taken 30 minutes before breakfast and dinner. Note, patient preferred to take Omeprazole instead of Protonix. -Continue Carafate 1 gm po tid -Soft diet as tolerated, Fair Life protein drink 8 to 16 ounces daily  -Follow up with Dr. Chales Abrahams in 4 weeks, to verify if repeat EGD required to assess for esophagitis healing    Weight loss secondary to lung cancer and radiation esophagitis  -Continue to monitor     Normocytic anemia, secondary to chronic illness, lung cancer and inadequate nutritional intake. No overt GI bleeding.  -Continue follow up with oncology    Hypokalemia, K+ 2.6 -Oncology managing, patient transferring care to Dr. Melvyn Neth in [place], see phone note 11/15/2022  Attending physician's note    I have taken history, reviewed the chart and examined the patient. I performed a substantive portion of this encounter, including complete performance of at least one of the key components, in conjunction with the APP. I agree with the Advanced Practitioner's note, impression and recommendations.      GERD with N/V and 3 cm HH Eso dysphagia-history of radiation induced esophagitis on last EGD 10/07/2022.  Also with mediastinal lymphadenopathy to rule out extrinsic compression. Recent PE on eliquis (Dr Melvyn Neth) Lung Adeno Ca with mediastinal lymphadenopathy on Durvalumab.  Followed by Dr. Melvyn Neth.     Plan: -Increased protonix 40mg  po BID 180, 4RF (mail in order) -UGI series (with Tab)- Mid eso stricture -If UGI abn, EGD off eliquis, at [facility] after clearence from Dr Melvyn Neth. -Continue phenergan PRN  For EGD today. Off eliquis Cleared by Dr Kathlen Mody, MD [facility] [phone]

## 2023-04-30 NOTE — Anesthesia Postprocedure Evaluation (Signed)
 Anesthesia Post Note  Patient: [name]  Procedure(s) Performed: ESOPHAGOGASTRODUODENOSCOPY (EGD) WITH PROPOFOL BALLOON DILATION     Patient location during evaluation: Endoscopy Anesthesia Type: General Level of consciousness: awake and alert Pain management: pain level controlled Vital Signs Assessment: post-procedure vital signs reviewed and stable Respiratory status: spontaneous breathing, nonlabored ventilation, respiratory function stable and patient connected to nasal cannula oxygen Cardiovascular status: blood pressure returned to baseline and stable Postop Assessment: no apparent nausea or vomiting Anesthetic complications: no  No notable events documented.  Last Vitals:  Vitals:   04/30/23 1010 04/30/23 1017  BP: 122/86   Pulse: (!) 107 (!) 105  Resp: (!) 29 16  Temp:    SpO2: 99% 100%    Last Pain:  Vitals:   04/30/23 1017  TempSrc:   PainSc: 0-No pain                 Johsua Shevlin L Afra Tricarico

## 2023-04-30 NOTE — Op Note (Addendum)
 [facility] Patient Name: [name] Procedure Date: 04/30/2023 MRN: [id number] Attending MD: Lynann Bologna , MD, [id number] Date of Birth: 19-Mar-1950 CSN: [id number] Age: 73 Admit Type: Outpatient Procedure:                Upper GI endoscopy Indications:              Dysphagia with abnormal barium swallow showing mid                            esophageal stricture. Providers:                Lynann Bologna, MD, Margaree Mackintosh, RN,                            Sunday Corn Mbumina, Technician Referring MD:              Medicines:                Monitored Anesthesia Care Complications:            No immediate complications. Estimated Blood Loss:     Estimated blood loss: none. Procedure:                Pre-Anesthesia Assessment:                           - Prior to the procedure, a History and Physical                            was performed, and patient medications and                            allergies were reviewed. The patient's tolerance of                            previous anesthesia was also reviewed. The risks                            and benefits of the procedure and the sedation                            options and risks were discussed with the patient.                            All questions were answered, and informed consent                            was obtained. Prior Anticoagulants: The patient has                            taken no anticoagulant or antiplatelet agents. ASA                            Grade Assessment: III - A patient with severe  systemic disease. After reviewing the risks and                            benefits, the patient was deemed in satisfactory                            condition to undergo the procedure.                           After obtaining informed consent, the endoscope was                            passed under direct vision. Throughout the                            procedure, the  patient's blood pressure, pulse, and                            oxygen saturations were monitored continuously. The                            [id number]-H190 ([id number]) Olympus endoscope was introduced                            through the mouth, and advanced to the second part                            of duodenum. The upper GI endoscopy was                            accomplished without difficulty. The patient                            tolerated the procedure well. Scope In: Scope Out: Findings:      One benign-appearing, intrinsic moderate (circumferential scarring or       stenosis; an endoscope may pass) radiation-induced stricture (with       multiple rings) was found 28 to 30 cm from the incisors. This stenosis       measured 1 cm (inner diameter). The stenosis was traversed. A TTS       dilator was passed through the scope. Dilation with a 12-13.5-15 mm       balloon dilator was performed to 15 mm. The dilation site was examined       and showed moderate mucosal disruption and moderate improvement in       luminal narrowing. We were able to bring 15 mm inflated balloon all the       way out of the esophagus. No esophagitis.      The Z-line was regular and was found 35 cm from the incisors.      A 3 cm hiatal hernia was present.      Multiple 6 to 8 mm semi-sessile polyps with no bleeding and no stigmata       of recent bleeding were found in the gastric fundus and in the gastric       body. Typical for fundic gland polyps,  hence not biopsied.      The examined duodenum was normal. Impression:               - Benign-appearing XRT induced esophageal stenosis.                            Dilated.                           - Z-line regular, 35 cm from the incisors.                           - 3 cm hiatal hernia.                           - Multiple gastric polyps.                           - Normal examined duodenum.                           - No specimens collected. Moderate  Sedation:      Not Applicable - Patient had care per Anesthesia. Recommendation:           - Patient has a contact number available for                            emergencies. The signs and symptoms of potential                            delayed complications were discussed with the                            patient. Return to normal activities tomorrow.                            Written discharge instructions were provided to the                            patient.                           - Post dilatation diet.                           - Continue present medications including Protonix                            40 mg p.o. twice daily for now.                           - Likely cough is not due to reflux. She is                            scheduled to have a PET scan in coming days.                           -  The findings and recommendations were discussed                            with the patient's family. Procedure Code(s):        --- Professional ---                           [phone], Esophagogastroduodenoscopy, flexible,                            transoral; with transendoscopic balloon dilation of                            esophagus (less than 30 mm diameter) Diagnosis Code(s):        --- Professional ---                           K22.2, Esophageal obstruction                           K44.9, Diaphragmatic hernia without obstruction or                            gangrene                           K31.7, Polyp of stomach and duodenum                           R13.10, Dysphagia, unspecified CPT copyright 2022 [employer]. All rights reserved. The codes documented in this report are preliminary and upon coder review may  be revised to meet current compliance requirements. Lynann Bologna, MD 04/30/2023 9:53:22 AM This report has been signed electronically. Number of Addenda: 0

## 2023-05-02 ENCOUNTER — Encounter (HOSPITAL_COMMUNITY): Payer: Self-pay | Admitting: Gastroenterology

## 2023-05-07 DIAGNOSIS — Z4689 Encounter for fitting and adjustment of other specified devices: Secondary | ICD-10-CM | POA: Diagnosis not present

## 2023-05-17 ENCOUNTER — Inpatient Hospital Stay

## 2023-05-17 ENCOUNTER — Inpatient Hospital Stay (HOSPITAL_BASED_OUTPATIENT_CLINIC_OR_DEPARTMENT_OTHER): Payer: No Typology Code available for payment source | Admitting: Hematology and Oncology

## 2023-05-17 ENCOUNTER — Ambulatory Visit

## 2023-05-17 ENCOUNTER — Inpatient Hospital Stay: Payer: No Typology Code available for payment source

## 2023-05-17 ENCOUNTER — Encounter: Payer: Self-pay | Admitting: Hematology and Oncology

## 2023-05-17 VITALS — BP 96/55 | HR 105 | Temp 97.8°F | Resp 18 | Ht 67.0 in | Wt 127.6 lb

## 2023-05-17 VITALS — BP 108/70 | HR 95

## 2023-05-17 DIAGNOSIS — C3491 Malignant neoplasm of unspecified part of right bronchus or lung: Secondary | ICD-10-CM | POA: Diagnosis not present

## 2023-05-17 DIAGNOSIS — Z5112 Encounter for antineoplastic immunotherapy: Secondary | ICD-10-CM | POA: Diagnosis not present

## 2023-05-17 LAB — CBC WITH DIFFERENTIAL (CANCER CENTER ONLY)
Abs Immature Granulocytes: 0.03 10*3/uL (ref 0.00–0.07)
Basophils Absolute: 0 10*3/uL (ref 0.0–0.1)
Basophils Relative: 1 %
Eosinophils Absolute: 0.1 10*3/uL (ref 0.0–0.5)
Eosinophils Relative: 1 %
HCT: 39.8 % (ref 36.0–46.0)
Hemoglobin: 12.9 g/dL (ref 12.0–15.0)
Immature Granulocytes: 0 %
Lymphocytes Relative: 14 %
Lymphs Abs: 1.1 10*3/uL (ref 0.7–4.0)
MCH: 27.2 pg (ref 26.0–34.0)
MCHC: 32.4 g/dL (ref 30.0–36.0)
MCV: 83.8 fL (ref 80.0–100.0)
Monocytes Absolute: 0.8 10*3/uL (ref 0.1–1.0)
Monocytes Relative: 9 %
Neutro Abs: 6.2 10*3/uL (ref 1.7–7.7)
Neutrophils Relative %: 75 %
Platelet Count: 291 10*3/uL (ref 150–400)
RBC: 4.75 MIL/uL (ref 3.87–5.11)
RDW: 19.9 % — ABNORMAL HIGH (ref 11.5–15.5)
WBC Count: 8.2 10*3/uL (ref 4.0–10.5)
nRBC: 0 % (ref 0.0–0.2)
nRBC: 0 /100{WBCs}

## 2023-05-17 LAB — CMP (CANCER CENTER ONLY)
ALT: 29 U/L (ref 0–44)
AST: 39 U/L (ref 15–41)
Albumin: 4 g/dL (ref 3.5–5.0)
Alkaline Phosphatase: 108 U/L (ref 38–126)
Anion gap: 11 (ref 5–15)
BUN: 20 mg/dL (ref 8–23)
CO2: 24 mmol/L (ref 22–32)
Calcium: 9.3 mg/dL (ref 8.9–10.3)
Chloride: 101 mmol/L (ref 98–111)
Creatinine: 0.84 mg/dL (ref 0.44–1.00)
GFR, Estimated: >60 mL/min (ref >60)
Glucose, Bld: 148 mg/dL — ABNORMAL HIGH (ref 70–99)
Potassium: 3.8 mmol/L (ref 3.5–5.1)
Sodium: 136 mmol/L (ref 135–145)
Total Bilirubin: 0.4 mg/dL (ref 0.0–1.2)
Total Protein: 7 g/dL (ref 6.5–8.1)

## 2023-05-17 MED ORDER — DURVALUMAB 500 MG/10ML IV SOLN
1500.0000 mg | Freq: Once | INTRAVENOUS | Status: AC
  Administered 2023-05-17: 1500 mg via INTRAVENOUS
  Filled 2023-05-17: qty 30

## 2023-05-17 MED ORDER — HEPARIN SOD (PORK) LOCK FLUSH 100 UNIT/ML IV SOLN
500.0000 [IU] | Freq: Once | INTRAVENOUS | Status: DC | PRN
Start: 2023-05-17 — End: 2023-05-17

## 2023-05-17 MED ORDER — SODIUM CHLORIDE 0.9% FLUSH: 10.0000 mL | INTRAVENOUS | Status: DC | PRN

## 2023-05-17 MED ORDER — SODIUM CHLORIDE 0.9 % IV SOLN
Freq: Once | INTRAVENOUS | Status: DC
Start: 2023-05-17 — End: 2023-05-17

## 2023-05-17 MED ORDER — SODIUM CHLORIDE 0.9 % IV SOLN: Freq: Once | INTRAVENOUS | Status: AC

## 2023-05-17 NOTE — Progress Notes (Cosign Needed)
 [facility]  [address] [place],  [place]  [address] [phone]  Clinic Day:  05/17/2023  Referring physician: [name], PA-C   HISTORY OF PRESENT ILLNESS:  The patient is a 74 y.o. 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  She comes in today to be evaluated before heading into her 7th cycle of durvalumab.  She claims to have tolerated her 6th cycle very well.  She also received IV iron in the form of Venofer this month, which she tolerated well.  She reports chronic cough, but denies any new respiratory symptoms, which concern her for progression of her lung cancer.  She saw Dr. Chales Abrahams and underwent EGD with esophageal dilatation earlier this month.  No source of blood loss was identified.  She states her swallowing has improved.  She states Dr. Chales Abrahams did not feel her chronic cough was related to her hiatal hernia.   With respect to her lung cancer history, she initially underwent a right middle lobectomy in February 2022.  Due to the mediastinal nodal recurrence in May 2024, the patient underwent definitive chemoradiation with weekly carboplatin/paclitaxel, which was completed in late July 2024.  Since then, she has been on maintenance durvalumab, for which she continues to take.     PHYSICAL EXAM:  Blood pressure (!) 96/55, pulse (!) 105, temperature 97.8 F (36.6 C), temperature source Oral, resp. rate 18, height 5\' 7"  (1.702 m), weight 127 lb 9.6 oz (57.9 kg), SpO2 100%. Wt Readings from Last 3 Encounters:  05/17/23 127 lb 9.6 oz (57.9 kg)  04/30/23 127 lb (57.6 kg)  04/25/23 128 lb (58.1 kg)   Body mass index is 19.98 kg/m.  Performance status (ECOG): 1 - Symptomatic but completely ambulatory  Physical Exam Vitals and nursing note reviewed.  Constitutional:      General: She is not in acute distress.    Appearance: Normal appearance.  HENT:     Head: Normocephalic and atraumatic.      Mouth/Throat:     Mouth: Mucous membranes are moist.     Pharynx: Oropharynx is clear. No oropharyngeal exudate or posterior oropharyngeal erythema.  Eyes:     General: No scleral icterus.    Extraocular Movements: Extraocular movements intact.     Conjunctiva/sclera: Conjunctivae normal.     Pupils: Pupils are equal, round, and reactive to light.  Cardiovascular:     Rate and Rhythm: Normal rate and regular rhythm.     Heart sounds: Normal heart sounds. No murmur heard.    No friction rub. No gallop.  Pulmonary:     Effort: Pulmonary effort is normal.     Breath sounds: Examination of the right-upper field reveals decreased breath sounds. Examination of the left-upper field reveals decreased breath sounds. Decreased breath sounds present. No wheezing, rhonchi or rales.  Abdominal:     General: There is no distension.     Palpations: Abdomen is soft. There is no hepatomegaly, splenomegaly or mass.     Tenderness: There is no abdominal tenderness.  Musculoskeletal:        General: Normal range of motion.     Cervical back: Normal range of motion and neck supple. No tenderness.     Right lower leg: No edema.     Left lower leg: No edema.  Lymphadenopathy:     Cervical: No cervical adenopathy.     Upper Body:     Right upper body: No  supraclavicular or axillary adenopathy.     Left upper body: No supraclavicular or axillary adenopathy.     Lower Body: No right inguinal adenopathy. No left inguinal adenopathy.  Skin:    General: Skin is warm and dry.     Coloration: Skin is not jaundiced.     Findings: No rash.  Neurological:     Mental Status: She is alert and oriented to person, place, and time.     Cranial Nerves: No cranial nerve deficit.  Psychiatric:        Mood and Affect: Mood normal.        Behavior: Behavior normal.        Thought Content: Thought content normal.     LABS:      Latest Ref Rng & Units 05/17/2023   11:05 AM 04/19/2023    9:47 AM 03/04/2023    3:09  PM  CBC  WBC 4.0 - 10.5 K/uL 8.2  8.2    Hemoglobin 12.0 - 15.0 g/dL 40.9  81.1  91.4   Hematocrit 36.0 - 46.0 % 39.8  33.8  34.0   Platelets 150 - 400 K/uL 291  319        Latest Ref Rng & Units 05/17/2023   11:05 AM 04/19/2023    9:47 AM 03/04/2023    3:09 PM  CMP  Glucose 70 - 99 mg/dL 782  956  213   BUN 8 - 23 mg/dL 20  15  8    Creatinine 0.44 - 1.00 mg/dL 0.86  5.78  4.69   Sodium 135 - 145 mmol/L 136  136  137   Potassium 3.5 - 5.1 mmol/L 3.8  3.8  4.0   Chloride 98 - 111 mmol/L 101  101  102   CO2 22 - 32 mmol/L 24  23    Calcium 8.9 - 10.3 mg/dL 9.3  9.0    Total Protein 6.5 - 8.1 g/dL 7.0  6.8    Total Bilirubin 0.0 - 1.2 mg/dL 0.4  0.4    Alkaline Phos 38 - 126 U/L 108  91    AST 15 - 41 U/L 39  28    ALT 0 - 44 U/L 29  16       Lab Results  Component Value Date   TIBC 381 03/22/2023   TIBC 440 12/08/2021   FERRITIN 14 03/22/2023   FERRITIN 16 12/08/2021   IRONPCTSAT 10 (L) 03/22/2023   IRONPCTSAT 18 12/08/2021     Review Flowsheet       Latest Ref Rng & Units 12/08/2021 03/22/2023  Oncology Labs  Ferritin 11 - 307 ng/mL 16  14   %SAT 10.4 - 31.8 % 18  10      STUDIES:  No results found.    ASSESSMENT & PLAN:   Assessment/Plan:  73 y.o. 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  She will proceed with her 7th cycle of durvalumab immunotherapy today.  Her hemoglobin has normalized with IV iron.  We will see this patient back in 4 weeks before she heads into her 8th cycle of maintenance durvalumab immunotherapy.  The patient understands all the plans discussed today and is in agreement with them.  She knows to contact our office if she develops concerns prior to her next appointment.     Adah Perl, PA-C   Physician Assistant [facility] [place] [phone]

## 2023-05-17 NOTE — Progress Notes (Signed)
 Ok to proceed today with durvalumab despite HR=105 per [facility]

## 2023-05-17 NOTE — Patient Instructions (Signed)
 Durvalumab Injection What is this medication? DURVALUMAB (dur VAL ue mab) treats some types of cancer. It works by helping your immune system slow or stop the spread of cancer cells. It is a monoclonal antibody. This medicine may be used for other purposes; ask your health care provider or pharmacist if you have questions. COMMON BRAND NAME(S): IMFINZI What should I tell my care team before I take this medication? They need to know if you have any of these conditions: Allogeneic stem cell transplant (uses someone else's stem cells) Autoimmune diseases, such as Crohn disease, ulcerative colitis, lupus History of chest radiation Nervous system problems, such as Guillain-Barre syndrome, myasthenia gravis Organ transplant An unusual or allergic reaction to durvalumab, other medications, foods, dyes, or preservatives Pregnant or trying to get pregnant Breast-feeding How should I use this medication? This medication is infused into a vein. It is given by your care team in a hospital or clinic setting. A special MedGuide will be given to you before each treatment. Be sure to read this information carefully each time. Talk to your care team about the use of this medication in children. Special care may be needed. Overdosage: If you think you have taken too much of this medicine contact a poison control center or emergency room at once. NOTE: This medicine is only for you. Do not share this medicine with others. What if I miss a dose? Keep appointments for follow-up doses. It is important not to miss your dose. Call your care team if you are unable to keep an appointment. What may interact with this medication? Interactions have not been studied. This list may not describe all possible interactions. Give your health care provider a list of all the medicines, herbs, non-prescription drugs, or dietary supplements you use. Also tell them if you smoke, drink alcohol, or use illegal drugs. Some items may  interact with your medicine. What should I watch for while using this medication? Your condition will be monitored carefully while you are receiving this medication. You may need blood work while taking this medication. This medication may cause serious skin reactions. They can happen weeks to months after starting the medication. Contact your care team right away if you notice fevers or flu-like symptoms with a rash. The rash may be red or purple and then turn into blisters or peeling of the skin. You may also notice a red rash with swelling of the face, lips, or lymph nodes in your neck or under your arms. Tell your care team right away if you have any change in your eyesight. Talk to your care team if you may be pregnant. Serious birth defects can occur if you take this medication during pregnancy and for 3 months after the last dose. You will need a negative pregnancy test before starting this medication. Contraception is recommended while taking this medication and for 3 months after the last dose. Your care team can help you find the option that works for you. Do not breastfeed while taking this medication and for 3 months after the last dose. What side effects may I notice from receiving this medication? Side effects that you should report to your care team as soon as possible: Allergic reactions--skin rash, itching, hives, swelling of the face, lips, tongue, or throat Dry cough, shortness of breath or trouble breathing Eye pain, redness, irritation, or discharge with blurry or decreased vision Heart muscle inflammation--unusual weakness or fatigue, shortness of breath, chest pain, fast or irregular heartbeat, dizziness, swelling of the  ankles, feet, or hands Hormone gland problems--headache, sensitivity to light, unusual weakness or fatigue, dizziness, fast or irregular heartbeat, increased sensitivity to cold or heat, excessive sweating, constipation, hair loss, increased thirst or amount of  urine, tremors or shaking, irritability Infusion reactions--chest pain, shortness of breath or trouble breathing, feeling faint or lightheaded Kidney injury (glomerulonephritis)--decrease in the amount of urine, red or dark brown urine, foamy or bubbly urine, swelling of the ankles, hands, or feet Liver injury--right upper belly pain, loss of appetite, nausea, light-colored stool, dark yellow or brown urine, yellowing skin or eyes, unusual weakness or fatigue Pain, tingling, or numbness in the hands or feet, muscle weakness, change in vision, confusion or trouble speaking, loss of balance or coordination, trouble walking, seizures Rash, fever, and swollen lymph nodes Redness, blistering, peeling, or loosening of the skin, including inside the mouth Sudden or severe stomach pain, bloody diarrhea, fever, nausea, vomiting Side effects that usually do not require medical attention (report these to your care team if they continue or are bothersome): Bone, joint, or muscle pain Diarrhea Fatigue Loss of appetite Nausea Skin rash This list may not describe all possible side effects. Call your doctor for medical advice about side effects. You may report side effects to FDA at 1-800-FDA-1088. Where should I keep my medication? This medication is given in a hospital or clinic. It will not be stored at home. NOTE: This sheet is a summary. It may not cover all possible information. If you have questions about this medicine, talk to your doctor, pharmacist, or health care provider.  2024 Elsevier/Gold Standard (2021-06-21 00:00:00)

## 2023-05-18 ENCOUNTER — Other Ambulatory Visit: Payer: Self-pay

## 2023-05-19 ENCOUNTER — Other Ambulatory Visit: Payer: Self-pay

## 2023-05-21 ENCOUNTER — Encounter: Payer: Self-pay | Admitting: Oncology

## 2023-06-13 NOTE — Progress Notes (Unsigned)
 [facility]  [address] [place],  [place]  [address] [phone]  Clinic Day:  06/14/2023  Referring physician: Deloria Fetch, MD   HISTORY OF PRESENT ILLNESS:  The patient is a 73 y.o. 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  She comes in today to be evaluated before heading into her 8th cycle of durvalumab .  She claims to have tolerated her 7th cycle very well.  She has chronic coughing, but denies having any new respiratory symptoms which concern her for progression of her lung cancer.  With respect to her lung cancer history, she initially underwent a right middle lobectomy in February 2022.  Due to the mediastinal nodal recurrence in May 2024, the patient underwent definitive chemoradiation with weekly carboplatin /paclitaxel , which was completed in late July 2024.  Since then, she has been on maintenance durvalumab , for which she continues to take.    PHYSICAL EXAM:  Blood pressure 129/68, pulse 82, temperature (!) 97.5 F (36.4 C), temperature source Oral, resp. rate 16, height 5\' 7"  (1.702 m), weight 129 lb (58.5 kg), SpO2 96%. Wt Readings from Last 3 Encounters:  06/14/23 129 lb (58.5 kg)  06/14/23 129 lb (58.5 kg)  05/17/23 127 lb 9.6 oz (57.9 kg)   Body mass index is 20.2 kg/m. Performance status (ECOG): 2 - Symptomatic, <50% confined to bed Physical Exam Constitutional:      Appearance: Normal appearance. She is not ill-appearing.     Comments: A chronically ill-appearing woman in no acute distress.  She looks stronger versus previous visits  HENT:     Mouth/Throat:     Mouth: Mucous membranes are moist.     Pharynx: Oropharynx is clear. No oropharyngeal exudate or posterior oropharyngeal erythema.  Cardiovascular:     Rate and Rhythm: Normal rate and regular rhythm.     Heart sounds: No murmur heard.    No friction rub. No gallop.  Pulmonary:     Effort: Pulmonary effort  is normal. No respiratory distress.     Breath sounds: Decreased air movement present. Examination of the right-upper field reveals decreased breath sounds. Examination of the left-upper field reveals decreased breath sounds. Decreased breath sounds present. No wheezing, rhonchi or rales.  Abdominal:     General: Bowel sounds are normal. There is no distension.     Palpations: Abdomen is soft. There is no mass.     Tenderness: There is no abdominal tenderness.  Musculoskeletal:        General: No swelling.     Right lower leg: No edema.     Left lower leg: No edema.  Lymphadenopathy:     Cervical: No cervical adenopathy.     Upper Body:     Right upper body: No supraclavicular or axillary adenopathy.     Left upper body: No supraclavicular or axillary adenopathy.     Lower Body: No right inguinal adenopathy. No left inguinal adenopathy.  Skin:    General: Skin is warm.     Coloration: Skin is not jaundiced.     Findings: No lesion or rash.  Neurological:     General: No focal deficit present.     Mental Status: She is alert and oriented to person, place, and time. Mental status is at baseline.  Psychiatric:        Mood and Affect: Mood normal.        Behavior: Behavior normal.  Thought Content: Thought content normal.   LABS:      Latest Ref Rng & Units 06/14/2023    9:20 AM 05/17/2023   11:05 AM 04/19/2023    9:47 AM  CBC  WBC 4.0 - 10.5 K/uL 6.0  8.2  8.2   Hemoglobin 12.0 - 15.0 g/dL 16.1  09.6  04.5   Hematocrit 36.0 - 46.0 % 39.7  39.8  33.8   Platelets 150 - 400 K/uL 281  291  319       Latest Ref Rng & Units 06/14/2023    9:20 AM 05/17/2023   11:05 AM 04/19/2023    9:47 AM  CMP  Glucose 70 - 99 mg/dL 409  811  914   BUN 8 - 23 mg/dL 9  20  15    Creatinine 0.44 - 1.00 mg/dL 7.82  9.56  2.13   Sodium 135 - 145 mmol/L 137  136  136   Potassium 3.5 - 5.1 mmol/L 3.9  3.8  3.8   Chloride 98 - 111 mmol/L 102  101  101   CO2 22 - 32 mmol/L 26  24  23    Calcium  8.9  - 10.3 mg/dL 9.1  9.3  9.0   Total Protein 6.5 - 8.1 g/dL 6.9  7.0  6.8   Total Bilirubin 0.0 - 1.2 mg/dL 0.3  0.4  0.4   Alkaline Phos 38 - 126 U/L 94  108  91   AST 15 - 41 U/L 25  39  28   ALT 0 - 44 U/L 18  29  16     ASSESSMENT & PLAN:  A 73 y.o. 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  She will proceed with her 8th cycle of durvalumab  immunotherapy today.  Clinically, the patient appears to be doing well.  Of note, her hemoglobin has significantly improved since receiving IV iron  in March 2025.  I will see this patient back in 4 weeks before she heads into her ninth cycle of maintenance durvalumab  immunotherapy.  CT scans will be done a day before her next visit to ascertain her new disease baseline after 8 cycles of maintenance Durvalumab .  The patient understands all the plans discussed today and is in agreement with them.  Elsi Stelzer Felicia Horde, MD

## 2023-06-14 ENCOUNTER — Inpatient Hospital Stay (HOSPITAL_BASED_OUTPATIENT_CLINIC_OR_DEPARTMENT_OTHER): Admitting: Oncology

## 2023-06-14 ENCOUNTER — Other Ambulatory Visit: Payer: Self-pay | Admitting: Oncology

## 2023-06-14 ENCOUNTER — Inpatient Hospital Stay: Attending: Internal Medicine

## 2023-06-14 ENCOUNTER — Inpatient Hospital Stay

## 2023-06-14 VITALS — BP 129/68 | HR 82 | Temp 97.5°F | Resp 16 | Ht 67.0 in | Wt 129.0 lb

## 2023-06-14 DIAGNOSIS — C3491 Malignant neoplasm of unspecified part of right bronchus or lung: Secondary | ICD-10-CM

## 2023-06-14 DIAGNOSIS — Z79899 Other long term (current) drug therapy: Secondary | ICD-10-CM | POA: Diagnosis not present

## 2023-06-14 DIAGNOSIS — Z5112 Encounter for antineoplastic immunotherapy: Secondary | ICD-10-CM | POA: Diagnosis not present

## 2023-06-14 DIAGNOSIS — C342 Malignant neoplasm of middle lobe, bronchus or lung: Secondary | ICD-10-CM | POA: Insufficient documentation

## 2023-06-14 LAB — CBC WITH DIFFERENTIAL (CANCER CENTER ONLY)
Abs Immature Granulocytes: 0.02 10*3/uL (ref 0.00–0.07)
Basophils Absolute: 0 10*3/uL (ref 0.0–0.1)
Basophils Relative: 0 %
Eosinophils Absolute: 0.2 10*3/uL (ref 0.0–0.5)
Eosinophils Relative: 3 %
HCT: 39.7 % (ref 36.0–46.0)
Hemoglobin: 13 g/dL (ref 12.0–15.0)
Immature Granulocytes: 0 %
Lymphocytes Relative: 20 %
Lymphs Abs: 1.2 10*3/uL (ref 0.7–4.0)
MCH: 28.3 pg (ref 26.0–34.0)
MCHC: 32.7 g/dL (ref 30.0–36.0)
MCV: 86.3 fL (ref 80.0–100.0)
Monocytes Absolute: 0.7 10*3/uL (ref 0.1–1.0)
Monocytes Relative: 12 %
Neutro Abs: 3.8 10*3/uL (ref 1.7–7.7)
Neutrophils Relative %: 65 %
Platelet Count: 281 10*3/uL (ref 150–400)
RBC: 4.6 MIL/uL (ref 3.87–5.11)
RDW: 19.6 % — ABNORMAL HIGH (ref 11.5–15.5)
WBC Count: 6 10*3/uL (ref 4.0–10.5)
nRBC: 0 % (ref 0.0–0.2)
nRBC: 0 /100{WBCs}

## 2023-06-14 LAB — CMP (CANCER CENTER ONLY)
ALT: 18 U/L (ref 0–44)
AST: 25 U/L (ref 15–41)
Albumin: 3.9 g/dL (ref 3.5–5.0)
Alkaline Phosphatase: 94 U/L (ref 38–126)
Anion gap: 10 (ref 5–15)
BUN: 9 mg/dL (ref 8–23)
CO2: 26 mmol/L (ref 22–32)
Calcium: 9.1 mg/dL (ref 8.9–10.3)
Chloride: 102 mmol/L (ref 98–111)
Creatinine: 0.84 mg/dL (ref 0.44–1.00)
GFR, Estimated: >60 mL/min (ref >60)
Glucose, Bld: 100 mg/dL — ABNORMAL HIGH (ref 70–99)
Potassium: 3.9 mmol/L (ref 3.5–5.1)
Sodium: 137 mmol/L (ref 135–145)
Total Bilirubin: 0.3 mg/dL (ref 0.0–1.2)
Total Protein: 6.9 g/dL (ref 6.5–8.1)

## 2023-06-14 MED ORDER — SODIUM CHLORIDE 0.9% FLUSH
10.0000 mL | INTRAVENOUS | Status: DC | PRN
  Administered 2023-06-14: 10 mL

## 2023-06-14 MED ORDER — SODIUM CHLORIDE 0.9 % IV SOLN
1500.0000 mg | Freq: Once | INTRAVENOUS | Status: AC
  Administered 2023-06-14: 1500 mg via INTRAVENOUS
  Filled 2023-06-14: qty 30

## 2023-06-14 MED ORDER — SODIUM CHLORIDE 0.9 % IV SOLN: Freq: Once | INTRAVENOUS | Status: AC

## 2023-06-14 MED ORDER — BENZONATATE 100 MG PO CAPS: 100.0000 mg | ORAL_CAPSULE | Freq: Three times a day (TID) | ORAL | 1 refills | Status: AC | PRN

## 2023-06-14 MED ORDER — HEPARIN SOD (PORK) LOCK FLUSH 100 UNIT/ML IV SOLN
500.0000 [IU] | Freq: Once | INTRAVENOUS | Status: AC | PRN
  Administered 2023-06-14: 500 [IU]

## 2023-06-14 NOTE — Patient Instructions (Signed)
 Durvalumab Injection What is this medication? DURVALUMAB (dur VAL ue mab) treats some types of cancer. It works by helping your immune system slow or stop the spread of cancer cells. It is a monoclonal antibody. This medicine may be used for other purposes; ask your health care provider or pharmacist if you have questions. COMMON BRAND NAME(S): IMFINZI What should I tell my care team before I take this medication? They need to know if you have any of these conditions: Allogeneic stem cell transplant (uses someone else's stem cells) Autoimmune diseases, such as Crohn disease, ulcerative colitis, lupus History of chest radiation Nervous system problems, such as Guillain-Barre syndrome, myasthenia gravis Organ transplant An unusual or allergic reaction to durvalumab, other medications, foods, dyes, or preservatives Pregnant or trying to get pregnant Breast-feeding How should I use this medication? This medication is infused into a vein. It is given by your care team in a hospital or clinic setting. A special MedGuide will be given to you before each treatment. Be sure to read this information carefully each time. Talk to your care team about the use of this medication in children. Special care may be needed. Overdosage: If you think you have taken too much of this medicine contact a poison control center or emergency room at once. NOTE: This medicine is only for you. Do not share this medicine with others. What if I miss a dose? Keep appointments for follow-up doses. It is important not to miss your dose. Call your care team if you are unable to keep an appointment. What may interact with this medication? Interactions have not been studied. This list may not describe all possible interactions. Give your health care provider a list of all the medicines, herbs, non-prescription drugs, or dietary supplements you use. Also tell them if you smoke, drink alcohol, or use illegal drugs. Some items may  interact with your medicine. What should I watch for while using this medication? Your condition will be monitored carefully while you are receiving this medication. You may need blood work while taking this medication. This medication may cause serious skin reactions. They can happen weeks to months after starting the medication. Contact your care team right away if you notice fevers or flu-like symptoms with a rash. The rash may be red or purple and then turn into blisters or peeling of the skin. You may also notice a red rash with swelling of the face, lips, or lymph nodes in your neck or under your arms. Tell your care team right away if you have any change in your eyesight. Talk to your care team if you may be pregnant. Serious birth defects can occur if you take this medication during pregnancy and for 3 months after the last dose. You will need a negative pregnancy test before starting this medication. Contraception is recommended while taking this medication and for 3 months after the last dose. Your care team can help you find the option that works for you. Do not breastfeed while taking this medication and for 3 months after the last dose. What side effects may I notice from receiving this medication? Side effects that you should report to your care team as soon as possible: Allergic reactions--skin rash, itching, hives, swelling of the face, lips, tongue, or throat Dry cough, shortness of breath or trouble breathing Eye pain, redness, irritation, or discharge with blurry or decreased vision Heart muscle inflammation--unusual weakness or fatigue, shortness of breath, chest pain, fast or irregular heartbeat, dizziness, swelling of the  ankles, feet, or hands Hormone gland problems--headache, sensitivity to light, unusual weakness or fatigue, dizziness, fast or irregular heartbeat, increased sensitivity to cold or heat, excessive sweating, constipation, hair loss, increased thirst or amount of  urine, tremors or shaking, irritability Infusion reactions--chest pain, shortness of breath or trouble breathing, feeling faint or lightheaded Kidney injury (glomerulonephritis)--decrease in the amount of urine, red or dark brown urine, foamy or bubbly urine, swelling of the ankles, hands, or feet Liver injury--right upper belly pain, loss of appetite, nausea, light-colored stool, dark yellow or brown urine, yellowing skin or eyes, unusual weakness or fatigue Pain, tingling, or numbness in the hands or feet, muscle weakness, change in vision, confusion or trouble speaking, loss of balance or coordination, trouble walking, seizures Rash, fever, and swollen lymph nodes Redness, blistering, peeling, or loosening of the skin, including inside the mouth Sudden or severe stomach pain, bloody diarrhea, fever, nausea, vomiting Side effects that usually do not require medical attention (report these to your care team if they continue or are bothersome): Bone, joint, or muscle pain Diarrhea Fatigue Loss of appetite Nausea Skin rash This list may not describe all possible side effects. Call your doctor for medical advice about side effects. You may report side effects to FDA at 1-800-FDA-1088. Where should I keep my medication? This medication is given in a hospital or clinic. It will not be stored at home. NOTE: This sheet is a summary. It may not cover all possible information. If you have questions about this medicine, talk to your doctor, pharmacist, or health care provider.  2024 Elsevier/Gold Standard (2021-06-21 00:00:00)

## 2023-06-15 ENCOUNTER — Encounter: Payer: Self-pay | Admitting: Oncology

## 2023-06-22 ENCOUNTER — Other Ambulatory Visit: Payer: Self-pay

## 2023-07-02 DIAGNOSIS — Z008 Encounter for other general examination: Secondary | ICD-10-CM | POA: Diagnosis not present

## 2023-07-02 DIAGNOSIS — C3490 Malignant neoplasm of unspecified part of unspecified bronchus or lung: Secondary | ICD-10-CM | POA: Diagnosis not present

## 2023-07-02 DIAGNOSIS — F17211 Nicotine dependence, cigarettes, in remission: Secondary | ICD-10-CM | POA: Diagnosis not present

## 2023-07-11 NOTE — Progress Notes (Signed)
 [facility]  [address] [place],  [place]  [address] [phone]  Clinic Day:  07/12/2023  Referring physician: Deloria Fetch, MD   HISTORY OF PRESENT ILLNESS:  The patient is a 73 y.o. 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  She comes in today to go over her CT scans to ascertain her new disease baseline after receiving 8 cycles of durvalumab .  She claims to have tolerated her 8th cycle very well.  She has chronic coughing, which has gotten better, but denies having any new respiratory symptoms which concern her for progression of her lung cancer.  With respect to her lung cancer history, she initially underwent a right middle lobectomy in February 2022.  Due to the mediastinal nodal recurrence in May 2024, the patient underwent definitive chemoradiation with weekly carboplatin /paclitaxel , which was completed in late July 2024.  Since then, she has been on maintenance durvalumab , for which she continues to take.    PHYSICAL EXAM:  Blood pressure 138/67, pulse 77, temperature 98.4 F (36.9 C), temperature source Oral, resp. rate 14, height 5\' 7"  (1.702 m), weight 131 lb 8 oz (59.6 kg), SpO2 98%. Wt Readings from Last 3 Encounters:  07/12/23 131 lb 8 oz (59.6 kg)  06/14/23 129 lb (58.5 kg)  06/14/23 129 lb (58.5 kg)   Body mass index is 20.6 kg/m. Performance status (ECOG): 2 - Symptomatic, <50% confined to bed Physical Exam Constitutional:      Appearance: Normal appearance. She is not ill-appearing.     Comments: A chronically ill-appearing woman in no acute distress.  She looks stronger versus previous visits  HENT:     Nose: No rhinorrhea.     Mouth/Throat:     Mouth: Mucous membranes are moist.     Pharynx: Oropharynx is clear. No oropharyngeal exudate or posterior oropharyngeal erythema.  Cardiovascular:     Rate and Rhythm: Normal rate and regular rhythm.     Heart sounds: No  murmur heard.    No friction rub. No gallop.  Pulmonary:     Effort: Pulmonary effort is normal. No respiratory distress.     Breath sounds: Decreased air movement present. Examination of the right-upper field reveals decreased breath sounds. Examination of the left-upper field reveals decreased breath sounds. Decreased breath sounds present. No wheezing, rhonchi or rales.  Abdominal:     General: Bowel sounds are normal. There is no distension.     Palpations: Abdomen is soft. There is no mass.     Tenderness: There is no abdominal tenderness.  Musculoskeletal:        General: No swelling.     Right lower leg: No edema.     Left lower leg: No edema.  Lymphadenopathy:     Cervical: No cervical adenopathy.     Upper Body:     Right upper body: No supraclavicular or axillary adenopathy.     Left upper body: No supraclavicular or axillary adenopathy.     Lower Body: No right inguinal adenopathy. No left inguinal adenopathy.  Skin:    General: Skin is warm.     Coloration: Skin is not jaundiced.     Findings: No lesion or rash.  Neurological:     General: No focal deficit present.     Mental Status: She is alert and oriented to person, place, and time. Mental status is at baseline.  Psychiatric:        Mood  and Affect: Mood normal.        Behavior: Behavior normal.        Thought Content: Thought content normal.   SCANS: Her chest CT done today revealed the following: FINDINGS: Cardiovascular: Normal cardiac size. No pericardial effusion. No aortic aneurysm. There are coronary artery calcifications, in keeping with coronary artery disease. There are also moderate peripheral atherosclerotic vascular calcifications of thoracic aorta and its major branches.   Mediastinum/Nodes: Visualized thyroid  gland appears grossly unremarkable. 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 The esophagus is nondistended precluding optimal assessment. No  axillary, mediastinal or hilar lymphadenopathy by size criteria.   Lungs/Pleura: The central tracheo-bronchial tree is patent. Redemonstration of postsurgical changes from middle lobectomy. Redemonstration of chronic scarring along the right posteromedial hemithorax with segmental collapse of the right lower lobe. No significant interval change. Complex artery emphysematous changes noted in the right upper lobe.   Left lung is grossly clear. No mass or consolidation. No pleural effusion or pneumothorax.   Upper Abdomen: There is mild diffuse hepatic steatosis. The remaining visualized upper abdominal viscera within normal limits.   Musculoskeletal: A CT Port-a-Cath is seen in the right upper chest wall with the catheter terminating in the cavo-atrial junction region. Visualized soft tissues of the chest wall are otherwise grossly unremarkable. No suspicious osseous lesions. There are mild to moderate multilevel degenerative changes in the visualized spine.   IMPRESSION: 1. Redemonstration of postsurgical changes from right middle lobectomy. No new or suspicious lung mass, consolidation, pleural effusion or pneumothorax. 2. Multiple other nonacute observations, as described above.   Aortic Atherosclerosis (ICD10-I70.0) and Emphysema (ICD10-J43.9).  LABS:      Latest Ref Rng & Units 07/12/2023    8:33 AM 06/14/2023    9:20 AM 05/17/2023   11:05 AM  CBC  WBC 4.0 - 10.5 K/uL 8.9  6.0  8.2   Hemoglobin 12.0 - 15.0 g/dL 09.8  11.9  14.7   Hematocrit 36.0 - 46.0 % 40.5  39.7  39.8   Platelets 150 - 400 K/uL 324  281  291       Latest Ref Rng & Units 07/12/2023    8:33 AM 06/14/2023    9:20 AM 05/17/2023   11:05 AM  CMP  Glucose 70 - 99 mg/dL 829  562  130   BUN 8 - 23 mg/dL 19  9  20    Creatinine 0.44 - 1.00 mg/dL 8.65  7.84  6.96   Sodium 135 - 145 mmol/L 137  137  136   Potassium 3.5 - 5.1 mmol/L 4.1  3.9  3.8   Chloride 98 - 111 mmol/L 98  102  101   CO2 22 - 32 mmol/L 27   26  24    Calcium  8.9 - 10.3 mg/dL 9.5  9.1  9.3   Total Protein 6.5 - 8.1 g/dL 6.8  6.9  7.0   Total Bilirubin 0.0 - 1.2 mg/dL 0.3  0.3  0.4   Alkaline Phos 38 - 126 U/L 85  94  108   AST 15 - 41 U/L 24  25  39   ALT 0 - 44 U/L 17  18  29     ASSESSMENT & PLAN:  A 73 y.o. 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  In clinic today, I went over all of her CT scan images with her, for which she could see there remains no evidence of disease recurrence.  She continues to show chronic radiation changes from her previous chemoradiation.  Based upon these findings, she will proceed with her 9th cycle of durvalumab  immunotherapy today.  Clinically, the patient appears to be doing well.  I will see this patient back in 4 weeks before she heads into her 10th cycle of maintenance durvalumab  immunotherapy.  The patient understands all the plans discussed today and is in agreement with them.  Darrious Youman Felicia Horde, MD

## 2023-07-12 ENCOUNTER — Inpatient Hospital Stay

## 2023-07-12 ENCOUNTER — Other Ambulatory Visit: Payer: Self-pay

## 2023-07-12 ENCOUNTER — Ambulatory Visit (HOSPITAL_BASED_OUTPATIENT_CLINIC_OR_DEPARTMENT_OTHER)
Admission: RE | Admit: 2023-07-12 | Discharge: 2023-07-12 | Disposition: A | Discharge status: Home / Self Care | Source: Ambulatory Visit | Attending: Oncology | Admitting: Oncology

## 2023-07-12 ENCOUNTER — Inpatient Hospital Stay: Attending: Internal Medicine

## 2023-07-12 ENCOUNTER — Inpatient Hospital Stay: Admitting: Oncology

## 2023-07-12 ENCOUNTER — Other Ambulatory Visit

## 2023-07-12 VITALS — BP 138/67 | HR 77 | Temp 98.4°F | Resp 14 | Ht 67.0 in | Wt 131.5 lb

## 2023-07-12 DIAGNOSIS — C3491 Malignant neoplasm of unspecified part of right bronchus or lung: Secondary | ICD-10-CM

## 2023-07-12 DIAGNOSIS — Z79899 Other long term (current) drug therapy: Secondary | ICD-10-CM | POA: Insufficient documentation

## 2023-07-12 DIAGNOSIS — R112 Nausea with vomiting, unspecified: Secondary | ICD-10-CM

## 2023-07-12 DIAGNOSIS — Z5112 Encounter for antineoplastic immunotherapy: Secondary | ICD-10-CM | POA: Diagnosis not present

## 2023-07-12 DIAGNOSIS — Z9221 Personal history of antineoplastic chemotherapy: Secondary | ICD-10-CM | POA: Diagnosis not present

## 2023-07-12 DIAGNOSIS — Z923 Personal history of irradiation: Secondary | ICD-10-CM | POA: Diagnosis not present

## 2023-07-12 DIAGNOSIS — I700 Atherosclerosis of aorta: Secondary | ICD-10-CM | POA: Diagnosis not present

## 2023-07-12 DIAGNOSIS — C342 Malignant neoplasm of middle lobe, bronchus or lung: Secondary | ICD-10-CM | POA: Diagnosis not present

## 2023-07-12 DIAGNOSIS — C3490 Malignant neoplasm of unspecified part of unspecified bronchus or lung: Secondary | ICD-10-CM | POA: Diagnosis not present

## 2023-07-12 DIAGNOSIS — J439 Emphysema, unspecified: Secondary | ICD-10-CM | POA: Diagnosis not present

## 2023-07-12 LAB — CMP (CANCER CENTER ONLY)
ALT: 17 U/L (ref 0–44)
AST: 24 U/L (ref 15–41)
Albumin: 3.8 g/dL (ref 3.5–5.0)
Alkaline Phosphatase: 85 U/L (ref 38–126)
Anion gap: 12 (ref 5–15)
BUN: 19 mg/dL (ref 8–23)
CO2: 27 mmol/L (ref 22–32)
Calcium: 9.5 mg/dL (ref 8.9–10.3)
Chloride: 98 mmol/L (ref 98–111)
Creatinine: 0.87 mg/dL (ref 0.44–1.00)
GFR, Estimated: >60 mL/min (ref >60)
Glucose, Bld: 111 mg/dL — ABNORMAL HIGH (ref 70–99)
Potassium: 4.1 mmol/L (ref 3.5–5.1)
Sodium: 137 mmol/L (ref 135–145)
Total Bilirubin: 0.3 mg/dL (ref 0.0–1.2)
Total Protein: 6.8 g/dL (ref 6.5–8.1)

## 2023-07-12 LAB — CBC WITH DIFFERENTIAL (CANCER CENTER ONLY)
Abs Immature Granulocytes: 0.03 10*3/uL (ref 0.00–0.07)
Basophils Absolute: 0 10*3/uL (ref 0.0–0.1)
Basophils Relative: 0 %
Eosinophils Absolute: 0.1 10*3/uL (ref 0.0–0.5)
Eosinophils Relative: 2 %
HCT: 40.5 % (ref 36.0–46.0)
Hemoglobin: 12.9 g/dL (ref 12.0–15.0)
Immature Granulocytes: 0 %
Lymphocytes Relative: 15 %
Lymphs Abs: 1.3 10*3/uL (ref 0.7–4.0)
MCH: 28.2 pg (ref 26.0–34.0)
MCHC: 31.9 g/dL (ref 30.0–36.0)
MCV: 88.4 fL (ref 80.0–100.0)
Monocytes Absolute: 0.8 10*3/uL (ref 0.1–1.0)
Monocytes Relative: 9 %
Neutro Abs: 6.6 10*3/uL (ref 1.7–7.7)
Neutrophils Relative %: 74 %
Platelet Count: 324 10*3/uL (ref 150–400)
RBC: 4.58 MIL/uL (ref 3.87–5.11)
RDW: 16.7 % — ABNORMAL HIGH (ref 11.5–15.5)
WBC Count: 8.9 10*3/uL (ref 4.0–10.5)
nRBC: 0 % (ref 0.0–0.2)

## 2023-07-12 LAB — TSH: TSH: 3.98 u[IU]/mL (ref 0.350–4.500)

## 2023-07-12 MED ORDER — IOHEXOL 300 MG/ML  SOLN
100.0000 mL | Freq: Once | INTRAMUSCULAR | Status: AC | PRN
  Administered 2023-07-12: 75 mL via INTRAVENOUS

## 2023-07-12 MED ORDER — HEPARIN SOD (PORK) LOCK FLUSH 100 UNIT/ML IV SOLN: 500.0000 [IU] | Freq: Once | INTRAVENOUS | Status: DC | PRN

## 2023-07-12 MED ORDER — ONDANSETRON 4 MG PO TBDP: 4.0000 mg | ORAL_TABLET | Freq: Three times a day (TID) | ORAL | 0 refills | Status: AC | PRN

## 2023-07-12 MED ORDER — DURVALUMAB 500 MG/10ML IV SOLN
1500.0000 mg | Freq: Once | INTRAVENOUS | Status: AC
  Administered 2023-07-12: 1500 mg via INTRAVENOUS
  Filled 2023-07-12: qty 30

## 2023-07-12 MED ORDER — SODIUM CHLORIDE 0.9 % IV SOLN
Freq: Once | INTRAVENOUS | Status: AC
Start: 2023-07-12 — End: 2023-07-12

## 2023-07-12 MED ORDER — SODIUM CHLORIDE 0.9% FLUSH
10.0000 mL | INTRAVENOUS | Status: DC | PRN
Start: 2023-07-12 — End: 2023-07-12
  Administered 2023-07-12: 10 mL

## 2023-07-12 NOTE — Patient Instructions (Signed)
 [facility] [place] - A DEPT OF [facility]  Discharge Instructions: Thank you for choosing [facility] to provide your oncology and hematology care.  If you have a lab appointment with the Cancer Center, please go directly to the Cancer Center and check in at the registration area.   Wear comfortable clothing and clothing appropriate for easy access to any Portacath or PICC line.   We strive to give you quality time with your provider. You may need to reschedule your appointment if you arrive late (15 or more minutes).  Arriving late affects you and other patients whose appointments are after yours.  Also, if you miss three or more appointments without notifying the office, you may be dismissed from the clinic at the provider's discretion.      For prescription refill requests, have your pharmacy contact our office and allow 72 hours for refills to be completed.    Today you received the following chemotherapy and/or immunotherapy agents Durvalumab      To help prevent nausea and vomiting after your treatment, we encourage you to take your nausea medication as directed.  BELOW ARE SYMPTOMS THAT SHOULD BE REPORTED IMMEDIATELY: *FEVER GREATER THAN 100.4 F (38 C) OR HIGHER *CHILLS OR SWEATING *NAUSEA AND VOMITING THAT IS NOT CONTROLLED WITH YOUR NAUSEA MEDICATION *UNUSUAL SHORTNESS OF BREATH *UNUSUAL BRUISING OR BLEEDING *URINARY PROBLEMS (pain or burning when urinating, or frequent urination) *BOWEL PROBLEMS (unusual diarrhea, constipation, pain near the anus) TENDERNESS IN MOUTH AND THROAT WITH OR WITHOUT PRESENCE OF ULCERS (sore throat, sores in mouth, or a toothache) UNUSUAL RASH, SWELLING OR PAIN  UNUSUAL VAGINAL DISCHARGE OR ITCHING   Items with * indicate a potential emergency and should be followed up as soon as possible or go to the Emergency Department if any problems should occur.  Please show the CHEMOTHERAPY ALERT CARD or IMMUNOTHERAPY  ALERT CARD at check-in to the Emergency Department and triage nurse.  Should you have questions after your visit or need to cancel or reschedule your appointment, please contact [facility] [place] - A DEPT OF [facility]  Dept: [phone]  and follow the prompts.  Office hours are 8:00 a.m. to 4:30 p.m. Monday - Friday. Please note that voicemails left after 4:00 p.m. may not be returned until the following business day.  We are closed weekends and major holidays. You have access to a nurse at all times for urgent questions. Please call the main number to the clinic Dept: [phone] and follow the prompts.  For any non-urgent questions, you may also contact your provider using MyChart. We now offer e-Visits for anyone 46 and older to request care online for non-urgent symptoms. For details visit [email or web address].   Also download the MyChart app! Go to the app store, search "MyChart", open the app, select [place], and log in with your MyChart username and password.

## 2023-07-13 ENCOUNTER — Other Ambulatory Visit: Payer: Self-pay

## 2023-07-13 LAB — T4: T4, Total: 9.7 ug/dL (ref 4.5–12.0)

## 2023-08-07 DIAGNOSIS — R1084 Generalized abdominal pain: Secondary | ICD-10-CM | POA: Diagnosis not present

## 2023-08-07 DIAGNOSIS — N898 Other specified noninflammatory disorders of vagina: Secondary | ICD-10-CM | POA: Diagnosis not present

## 2023-08-07 DIAGNOSIS — Z4689 Encounter for fitting and adjustment of other specified devices: Secondary | ICD-10-CM | POA: Diagnosis not present

## 2023-08-08 NOTE — Progress Notes (Unsigned)
 [facility]  [address] [place],  [place]  [address] [phone]  Clinic Day:  08/09/2023  Referring physician: Cyndi Drain, PA-C   HISTORY OF PRESENT ILLNESS:  The patient is a 73 y.o. 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  She comes in today to be evaluated before heading into her 10th cycle of durvalumab .  She claims to have tolerated her 9th cycle very well.  She has chronic coughing, which has gotten better, but denies having any new respiratory symptoms which concern her for progression of her lung cancer.  With respect to her lung cancer history, she initially underwent a right middle lobectomy in February 2022.  Due to the mediastinal nodal recurrence in May 2024, the patient underwent definitive chemoradiation with weekly carboplatin /paclitaxel , which was completed in late July 2024.  Since then, she has been on maintenance durvalumab , for which she continues to take.    PHYSICAL EXAM:  Blood pressure (!) 126/50, pulse 84, temperature 97.6 F (36.4 C), temperature source Oral, resp. rate 20, height 5' 7 (1.702 m), weight 133 lb 11.2 oz (60.6 kg), SpO2 100%. Wt Readings from Last 3 Encounters:  08/09/23 133 lb 11.2 oz (60.6 kg)  07/12/23 131 lb 8 oz (59.6 kg)  06/14/23 129 lb (58.5 kg)   Body mass index is 20.94 kg/m. Performance status (ECOG): 2 - Symptomatic, <50% confined to bed Physical Exam Constitutional:      Appearance: Normal appearance. She is not ill-appearing.     Comments: A chronically ill-appearing woman in no acute distress.  She looks stronger versus previous visits  HENT:     Nose: No rhinorrhea.     Mouth/Throat:     Mouth: Mucous membranes are moist.     Pharynx: Oropharynx is clear. No oropharyngeal exudate or posterior oropharyngeal erythema.   Cardiovascular:     Rate and Rhythm: Normal rate and regular rhythm.     Heart sounds: No murmur heard.    No  friction rub. No gallop.  Pulmonary:     Effort: Pulmonary effort is normal. No respiratory distress.     Breath sounds: Decreased air movement present. Examination of the right-upper field reveals decreased breath sounds. Examination of the left-upper field reveals decreased breath sounds. Decreased breath sounds present. No wheezing, rhonchi or rales.  Abdominal:     General: Bowel sounds are normal. There is no distension.     Palpations: Abdomen is soft. There is no mass.     Tenderness: There is no abdominal tenderness.   Musculoskeletal:        General: No swelling.     Right lower leg: No edema.     Left lower leg: No edema.  Lymphadenopathy:     Cervical: No cervical adenopathy.     Upper Body:     Right upper body: No supraclavicular or axillary adenopathy.     Left upper body: No supraclavicular or axillary adenopathy.     Lower Body: No right inguinal adenopathy. No left inguinal adenopathy.   Skin:    General: Skin is warm.     Coloration: Skin is not jaundiced.     Findings: No lesion or rash.   Neurological:     General: No focal deficit present.     Mental Status: She is alert and oriented to person, place, and time. Mental status is at baseline.   Psychiatric:        Mood  and Affect: Mood normal.        Behavior: Behavior normal.        Thought Content: Thought content normal.    LABS:      Latest Ref Rng & Units 08/09/2023    9:08 AM 07/12/2023    8:33 AM 06/14/2023    9:20 AM  CBC  WBC 4.0 - 10.5 K/uL 7.5  8.9  6.0   Hemoglobin 12.0 - 15.0 g/dL 11.9  14.7  82.9   Hematocrit 36.0 - 46.0 % 40.7  40.5  39.7   Platelets 150 - 400 K/uL 245  324  281       Latest Ref Rng & Units 08/09/2023    9:08 AM 07/12/2023    8:33 AM 06/14/2023    9:20 AM  CMP  Glucose 70 - 99 mg/dL 562  130  865   BUN 8 - 23 mg/dL 12  19  9    Creatinine 0.44 - 1.00 mg/dL 7.84  6.96  2.95   Sodium 135 - 145 mmol/L 138  137  137   Potassium 3.5 - 5.1 mmol/L 4.1  4.1  3.9   Chloride  98 - 111 mmol/L 102  98  102   CO2 22 - 32 mmol/L 25  27  26    Calcium  8.9 - 10.3 mg/dL 9.4  9.5  9.1   Total Protein 6.5 - 8.1 g/dL 6.8  6.8  6.9   Total Bilirubin 0.0 - 1.2 mg/dL 0.4  0.3  0.3   Alkaline Phos 38 - 126 U/L 89  85  94   AST 15 - 41 U/L 22  24  25    ALT 0 - 44 U/L 15  17  18     ASSESSMENT & PLAN:  A 73 y.o. 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 She will proceed with her 10th cycle of durvalumab  immunotherapy today.  Clinically, the patient appears to be doing well.  I will see this patient back in 4 weeks before she heads into her 11th cycle of maintenance durvalumab  immunotherapy.  The patient understands all the plans discussed today and is in agreement with them.  Fouad Taul Felicia Horde, MD

## 2023-08-09 ENCOUNTER — Inpatient Hospital Stay: Attending: Internal Medicine

## 2023-08-09 ENCOUNTER — Inpatient Hospital Stay

## 2023-08-09 ENCOUNTER — Other Ambulatory Visit: Payer: Self-pay

## 2023-08-09 ENCOUNTER — Telehealth: Payer: Self-pay | Admitting: Oncology

## 2023-08-09 ENCOUNTER — Inpatient Hospital Stay (HOSPITAL_BASED_OUTPATIENT_CLINIC_OR_DEPARTMENT_OTHER): Admitting: Oncology

## 2023-08-09 VITALS — BP 126/50 | HR 84 | Temp 97.6°F | Resp 20 | Ht 67.0 in | Wt 133.7 lb

## 2023-08-09 DIAGNOSIS — Z5112 Encounter for antineoplastic immunotherapy: Secondary | ICD-10-CM | POA: Insufficient documentation

## 2023-08-09 DIAGNOSIS — C342 Malignant neoplasm of middle lobe, bronchus or lung: Secondary | ICD-10-CM | POA: Insufficient documentation

## 2023-08-09 DIAGNOSIS — C3491 Malignant neoplasm of unspecified part of right bronchus or lung: Secondary | ICD-10-CM

## 2023-08-09 DIAGNOSIS — E860 Dehydration: Secondary | ICD-10-CM

## 2023-08-09 DIAGNOSIS — Z79899 Other long term (current) drug therapy: Secondary | ICD-10-CM | POA: Insufficient documentation

## 2023-08-09 LAB — CBC WITH DIFFERENTIAL (CANCER CENTER ONLY)
Abs Immature Granulocytes: 0.02 10*3/uL (ref 0.00–0.07)
Basophils Absolute: 0 10*3/uL (ref 0.0–0.1)
Basophils Relative: 0 %
Eosinophils Absolute: 0.3 10*3/uL (ref 0.0–0.5)
Eosinophils Relative: 4 %
HCT: 40.7 % (ref 36.0–46.0)
Hemoglobin: 13.1 g/dL (ref 12.0–15.0)
Immature Granulocytes: 0 %
Lymphocytes Relative: 15 %
Lymphs Abs: 1.1 10*3/uL (ref 0.7–4.0)
MCH: 29 pg (ref 26.0–34.0)
MCHC: 32.2 g/dL (ref 30.0–36.0)
MCV: 90 fL (ref 80.0–100.0)
Monocytes Absolute: 0.8 10*3/uL (ref 0.1–1.0)
Monocytes Relative: 10 %
Neutro Abs: 5.2 10*3/uL (ref 1.7–7.7)
Neutrophils Relative %: 71 %
Platelet Count: 245 10*3/uL (ref 150–400)
RBC: 4.52 MIL/uL (ref 3.87–5.11)
RDW: 14 % (ref 11.5–15.5)
WBC Count: 7.5 10*3/uL (ref 4.0–10.5)
nRBC: 0 % (ref 0.0–0.2)

## 2023-08-09 LAB — CMP (CANCER CENTER ONLY)
ALT: 15 U/L (ref 0–44)
AST: 22 U/L (ref 15–41)
Albumin: 3.9 g/dL (ref 3.5–5.0)
Alkaline Phosphatase: 89 U/L (ref 38–126)
Anion gap: 10 (ref 5–15)
BUN: 12 mg/dL (ref 8–23)
CO2: 25 mmol/L (ref 22–32)
Calcium: 9.4 mg/dL (ref 8.9–10.3)
Chloride: 102 mmol/L (ref 98–111)
Creatinine: 0.83 mg/dL (ref 0.44–1.00)
GFR, Estimated: >60 mL/min (ref >60)
Glucose, Bld: 109 mg/dL — ABNORMAL HIGH (ref 70–99)
Potassium: 4.1 mmol/L (ref 3.5–5.1)
Sodium: 138 mmol/L (ref 135–145)
Total Bilirubin: 0.4 mg/dL (ref 0.0–1.2)
Total Protein: 6.8 g/dL (ref 6.5–8.1)

## 2023-08-09 LAB — MAGNESIUM: Magnesium: 2 mg/dL (ref 1.7–2.4)

## 2023-08-09 MED ORDER — SODIUM CHLORIDE 0.9% FLUSH
10.0000 mL | INTRAVENOUS | Status: DC | PRN
Start: 2023-08-09 — End: 2023-08-09
  Administered 2023-08-09: 10 mL

## 2023-08-09 MED ORDER — HEPARIN SOD (PORK) LOCK FLUSH 100 UNIT/ML IV SOLN
500.0000 [IU] | Freq: Once | INTRAVENOUS | Status: AC | PRN
  Administered 2023-08-09: 500 [IU]

## 2023-08-09 MED ORDER — SODIUM CHLORIDE 0.9 % IV SOLN
1500.0000 mg | Freq: Once | INTRAVENOUS | Status: AC
  Administered 2023-08-09: 1500 mg via INTRAVENOUS
  Filled 2023-08-09: qty 30

## 2023-08-09 MED ORDER — SODIUM CHLORIDE 0.9 % IV SOLN: Freq: Once | INTRAVENOUS | Status: AC

## 2023-08-09 NOTE — Telephone Encounter (Signed)
 Patient has been scheduled for follow-up visit per 08/09/23 LOS.  Pt given an appt calendar with date and time.

## 2023-08-09 NOTE — Patient Instructions (Signed)
 Durvalumab Injection What is this medication? DURVALUMAB (dur VAL ue mab) treats some types of cancer. It works by helping your immune system slow or stop the spread of cancer cells. It is a monoclonal antibody. This medicine may be used for other purposes; ask your health care provider or pharmacist if you have questions. COMMON BRAND NAME(S): IMFINZI What should I tell my care team before I take this medication? They need to know if you have any of these conditions: Allogeneic stem cell transplant (uses someone else's stem cells) Autoimmune diseases, such as Crohn disease, ulcerative colitis, lupus History of chest radiation Nervous system problems, such as Guillain-Barre syndrome, myasthenia gravis Organ transplant An unusual or allergic reaction to durvalumab, other medications, foods, dyes, or preservatives Pregnant or trying to get pregnant Breast-feeding How should I use this medication? This medication is infused into a vein. It is given by your care team in a hospital or clinic setting. A special MedGuide will be given to you before each treatment. Be sure to read this information carefully each time. Talk to your care team about the use of this medication in children. Special care may be needed. Overdosage: If you think you have taken too much of this medicine contact a poison control center or emergency room at once. NOTE: This medicine is only for you. Do not share this medicine with others. What if I miss a dose? Keep appointments for follow-up doses. It is important not to miss your dose. Call your care team if you are unable to keep an appointment. What may interact with this medication? Interactions have not been studied. This list may not describe all possible interactions. Give your health care provider a list of all the medicines, herbs, non-prescription drugs, or dietary supplements you use. Also tell them if you smoke, drink alcohol, or use illegal drugs. Some items may  interact with your medicine. What should I watch for while using this medication? Your condition will be monitored carefully while you are receiving this medication. You may need blood work while taking this medication. This medication may cause serious skin reactions. They can happen weeks to months after starting the medication. Contact your care team right away if you notice fevers or flu-like symptoms with a rash. The rash may be red or purple and then turn into blisters or peeling of the skin. You may also notice a red rash with swelling of the face, lips, or lymph nodes in your neck or under your arms. Tell your care team right away if you have any change in your eyesight. Talk to your care team if you may be pregnant. Serious birth defects can occur if you take this medication during pregnancy and for 3 months after the last dose. You will need a negative pregnancy test before starting this medication. Contraception is recommended while taking this medication and for 3 months after the last dose. Your care team can help you find the option that works for you. Do not breastfeed while taking this medication and for 3 months after the last dose. What side effects may I notice from receiving this medication? Side effects that you should report to your care team as soon as possible: Allergic reactions--skin rash, itching, hives, swelling of the face, lips, tongue, or throat Dry cough, shortness of breath or trouble breathing Eye pain, redness, irritation, or discharge with blurry or decreased vision Heart muscle inflammation--unusual weakness or fatigue, shortness of breath, chest pain, fast or irregular heartbeat, dizziness, swelling of the  ankles, feet, or hands Hormone gland problems--headache, sensitivity to light, unusual weakness or fatigue, dizziness, fast or irregular heartbeat, increased sensitivity to cold or heat, excessive sweating, constipation, hair loss, increased thirst or amount of  urine, tremors or shaking, irritability Infusion reactions--chest pain, shortness of breath or trouble breathing, feeling faint or lightheaded Kidney injury (glomerulonephritis)--decrease in the amount of urine, red or dark brown urine, foamy or bubbly urine, swelling of the ankles, hands, or feet Liver injury--right upper belly pain, loss of appetite, nausea, light-colored stool, dark yellow or brown urine, yellowing skin or eyes, unusual weakness or fatigue Pain, tingling, or numbness in the hands or feet, muscle weakness, change in vision, confusion or trouble speaking, loss of balance or coordination, trouble walking, seizures Rash, fever, and swollen lymph nodes Redness, blistering, peeling, or loosening of the skin, including inside the mouth Sudden or severe stomach pain, bloody diarrhea, fever, nausea, vomiting Side effects that usually do not require medical attention (report these to your care team if they continue or are bothersome): Bone, joint, or muscle pain Diarrhea Fatigue Loss of appetite Nausea Skin rash This list may not describe all possible side effects. Call your doctor for medical advice about side effects. You may report side effects to FDA at 1-800-FDA-1088. Where should I keep my medication? This medication is given in a hospital or clinic. It will not be stored at home. NOTE: This sheet is a summary. It may not cover all possible information. If you have questions about this medicine, talk to your doctor, pharmacist, or health care provider.  2024 Elsevier/Gold Standard (2021-06-21 00:00:00)

## 2023-08-10 ENCOUNTER — Other Ambulatory Visit: Payer: Self-pay

## 2023-09-05 NOTE — Progress Notes (Unsigned)
 [facility]  [address] [place],  [place]  [address] [phone]  Clinic Day:  09/06/2023  Referring physician: Nicholaus Credit, PA-C   HISTORY OF PRESENT ILLNESS:  The patient is a 73 y.o. 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  She comes in today to be evaluated before heading into her 11th cycle of durvalumab .  She claims to have tolerated her 10th cycle very well.  She has chronic coughing, which has gotten better, but denies having any new respiratory symptoms which concern her for progression of her lung cancer.  With respect to her lung cancer history, she initially underwent a right middle lobectomy in February 2022.  Due to the mediastinal nodal recurrence in May 2024, the patient underwent definitive chemoradiation with weekly carboplatin /paclitaxel , which was completed in late July 2024.  Since then, she has been on maintenance durvalumab , for which she continues to take.    PHYSICAL EXAM:  Blood pressure 122/65, pulse 83, temperature 98.2 F (36.8 C), temperature source Oral, resp. rate 14, height 5' 7 (1.702 m), weight 134 lb 8 oz (61 kg), SpO2 92%. Wt Readings from Last 3 Encounters:  09/06/23 134 lb 8 oz (61 kg)  08/09/23 133 lb 11.2 oz (60.6 kg)  07/12/23 131 lb 8 oz (59.6 kg)   Body mass index is 21.07 kg/m. Performance status (ECOG): 1 Physical Exam Constitutional:      Appearance: Normal appearance. She is not ill-appearing.     Comments: She continues to look physically better with successive visits  HENT:     Nose: No rhinorrhea.     Mouth/Throat:     Mouth: Mucous membranes are moist.     Pharynx: Oropharynx is clear. No oropharyngeal exudate or posterior oropharyngeal erythema.  Cardiovascular:     Rate and Rhythm: Normal rate and regular rhythm.     Heart sounds: No murmur heard.    No friction rub. No gallop.  Pulmonary:     Effort: Pulmonary effort is normal. No  respiratory distress.     Breath sounds: Decreased air movement present. Examination of the right-upper field reveals decreased breath sounds. Examination of the left-upper field reveals decreased breath sounds. Decreased breath sounds present. No wheezing, rhonchi or rales.  Abdominal:     General: Bowel sounds are normal. There is no distension.     Palpations: Abdomen is soft. There is no mass.     Tenderness: There is no abdominal tenderness.  Musculoskeletal:        General: No swelling.     Right lower leg: No edema.     Left lower leg: No edema.  Lymphadenopathy:     Cervical: No cervical adenopathy.     Upper Body:     Right upper body: No supraclavicular or axillary adenopathy.     Left upper body: No supraclavicular or axillary adenopathy.     Lower Body: No right inguinal adenopathy. No left inguinal adenopathy.  Skin:    General: Skin is warm.     Coloration: Skin is not jaundiced.     Findings: No lesion or rash.  Neurological:     General: No focal deficit present.     Mental Status: She is alert and oriented to person, place, and time. Mental status is at baseline.  Psychiatric:        Mood and Affect: Mood normal.        Behavior: Behavior normal.  Thought Content: Thought content normal.    LABS:      Latest Ref Rng & Units 09/06/2023    8:45 AM 08/09/2023    9:08 AM 07/12/2023    8:33 AM  CBC  WBC 4.0 - 10.5 K/uL 5.7  7.5  8.9   Hemoglobin 12.0 - 15.0 g/dL 87.1  86.8  87.0   Hematocrit 36.0 - 46.0 % 38.3  40.7  40.5   Platelets 150 - 400 K/uL 230  245  324       Latest Ref Rng & Units 09/06/2023    8:45 AM 08/09/2023    9:08 AM 07/12/2023    8:33 AM  CMP  Glucose 70 - 99 mg/dL 836  890  888   BUN 8 - 23 mg/dL 16  12  19    Creatinine 0.44 - 1.00 mg/dL 9.12  9.16  9.12   Sodium 135 - 145 mmol/L 137  138  137   Potassium 3.5 - 5.1 mmol/L 3.7  4.1  4.1   Chloride 98 - 111 mmol/L 102  102  98   CO2 22 - 32 mmol/L 23  25  27    Calcium  8.9 - 10.3  mg/dL 8.9  9.4  9.5   Total Protein 6.5 - 8.1 g/dL 6.8  6.8  6.8   Total Bilirubin 0.0 - 1.2 mg/dL 0.4  0.4  0.3   Alkaline Phos 38 - 126 U/L 90  89  85   AST 15 - 41 U/L 30  22  24    ALT 0 - 44 U/L 25  15  17     ASSESSMENT & PLAN:  A 73 y.o. 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 She will proceed with her 11th cycle of durvalumab  immunotherapy today.  Clinically, the patient appears to be doing well.  I will see this patient back in 4 weeks before she heads into her 12th and final cycle of maintenance durvalumab  immunotherapy.  The patient understands all the plans discussed today and is in agreement with them.  Bionca Mckey DELENA Kerns, MD

## 2023-09-06 ENCOUNTER — Inpatient Hospital Stay

## 2023-09-06 ENCOUNTER — Inpatient Hospital Stay: Admitting: Oncology

## 2023-09-06 ENCOUNTER — Other Ambulatory Visit: Payer: Self-pay | Admitting: Oncology

## 2023-09-06 ENCOUNTER — Telehealth: Payer: Self-pay | Admitting: Oncology

## 2023-09-06 ENCOUNTER — Inpatient Hospital Stay: Attending: Internal Medicine

## 2023-09-06 VITALS — BP 122/65 | HR 83 | Temp 98.2°F | Resp 14 | Ht 67.0 in | Wt 134.5 lb

## 2023-09-06 VITALS — BP 106/76 | HR 54 | Temp 98.2°F | Resp 14

## 2023-09-06 DIAGNOSIS — C3491 Malignant neoplasm of unspecified part of right bronchus or lung: Secondary | ICD-10-CM

## 2023-09-06 DIAGNOSIS — Z5112 Encounter for antineoplastic immunotherapy: Secondary | ICD-10-CM | POA: Diagnosis not present

## 2023-09-06 DIAGNOSIS — Z79899 Other long term (current) drug therapy: Secondary | ICD-10-CM | POA: Diagnosis not present

## 2023-09-06 DIAGNOSIS — C342 Malignant neoplasm of middle lobe, bronchus or lung: Secondary | ICD-10-CM | POA: Diagnosis not present

## 2023-09-06 LAB — CMP (CANCER CENTER ONLY)
ALT: 25 U/L (ref 0–44)
AST: 30 U/L (ref 15–41)
Albumin: 3.6 g/dL (ref 3.5–5.0)
Alkaline Phosphatase: 90 U/L (ref 38–126)
Anion gap: 12 (ref 5–15)
BUN: 16 mg/dL (ref 8–23)
CO2: 23 mmol/L (ref 22–32)
Calcium: 8.9 mg/dL (ref 8.9–10.3)
Chloride: 102 mmol/L (ref 98–111)
Creatinine: 0.87 mg/dL (ref 0.44–1.00)
GFR, Estimated: >60 mL/min (ref >60)
Glucose, Bld: 163 mg/dL — ABNORMAL HIGH (ref 70–99)
Potassium: 3.7 mmol/L (ref 3.5–5.1)
Sodium: 137 mmol/L (ref 135–145)
Total Bilirubin: 0.4 mg/dL (ref 0.0–1.2)
Total Protein: 6.8 g/dL (ref 6.5–8.1)

## 2023-09-06 LAB — CBC WITH DIFFERENTIAL (CANCER CENTER ONLY)
Abs Immature Granulocytes: 0.02 K/uL (ref 0.00–0.07)
Basophils Absolute: 0 K/uL (ref 0.0–0.1)
Basophils Relative: 0 %
Eosinophils Absolute: 0.2 K/uL (ref 0.0–0.5)
Eosinophils Relative: 4 %
HCT: 38.3 % (ref 36.0–46.0)
Hemoglobin: 12.8 g/dL (ref 12.0–15.0)
Immature Granulocytes: 0 %
Lymphocytes Relative: 14 %
Lymphs Abs: 0.8 K/uL (ref 0.7–4.0)
MCH: 29.9 pg (ref 26.0–34.0)
MCHC: 33.4 g/dL (ref 30.0–36.0)
MCV: 89.5 fL (ref 80.0–100.0)
Monocytes Absolute: 0.6 K/uL (ref 0.1–1.0)
Monocytes Relative: 10 %
Neutro Abs: 4 K/uL (ref 1.7–7.7)
Neutrophils Relative %: 72 %
Platelet Count: 230 K/uL (ref 150–400)
RBC: 4.28 MIL/uL (ref 3.87–5.11)
RDW: 13.5 % (ref 11.5–15.5)
WBC Count: 5.7 K/uL (ref 4.0–10.5)
nRBC: 0 % (ref 0.0–0.2)

## 2023-09-06 MED ORDER — SODIUM CHLORIDE 0.9 % IV SOLN: Freq: Once | INTRAVENOUS | Status: DC | PRN

## 2023-09-06 MED ORDER — METHYLPREDNISOLONE SODIUM SUCC 125 MG IJ SOLR
125.0000 mg | Freq: Once | INTRAMUSCULAR | Status: DC | PRN
Start: 2023-09-06 — End: 2023-09-06

## 2023-09-06 MED ORDER — ALTEPLASE 2 MG IJ SOLR
2.0000 mg | Freq: Once | INTRAMUSCULAR | Status: DC | PRN
Start: 2023-09-06 — End: 2023-09-06

## 2023-09-06 MED ORDER — FAMOTIDINE IN NACL 20-0.9 MG/50ML-% IV SOLN: 20.0000 mg | Freq: Once | INTRAVENOUS | Status: DC | PRN

## 2023-09-06 MED ORDER — DIPHENHYDRAMINE HCL 50 MG/ML IJ SOLN: 50.0000 mg | Freq: Once | INTRAMUSCULAR | Status: DC | PRN

## 2023-09-06 MED ORDER — SODIUM CHLORIDE 0.9% FLUSH
10.0000 mL | INTRAVENOUS | Status: DC | PRN
  Administered 2023-09-06: 10 mL

## 2023-09-06 MED ORDER — SODIUM CHLORIDE 0.9 % IV SOLN: Freq: Once | INTRAVENOUS | Status: AC

## 2023-09-06 MED ORDER — SODIUM CHLORIDE 0.9 % IV SOLN
1500.0000 mg | Freq: Once | INTRAVENOUS | Status: AC
  Administered 2023-09-06: 1500 mg via INTRAVENOUS
  Filled 2023-09-06: qty 30

## 2023-09-06 MED ORDER — ALBUTEROL SULFATE HFA 108 (90 BASE) MCG/ACT IN AERS
2.0000 | INHALATION_SPRAY | Freq: Once | RESPIRATORY_TRACT | Status: DC | PRN
Start: 2023-09-06 — End: 2023-09-06

## 2023-09-06 MED ORDER — SODIUM CHLORIDE 0.9% FLUSH: 3.0000 mL | INTRAVENOUS | Status: DC | PRN

## 2023-09-06 MED ORDER — HEPARIN SOD (PORK) LOCK FLUSH 100 UNIT/ML IV SOLN
250.0000 [IU] | Freq: Once | INTRAVENOUS | Status: DC | PRN
Start: 2023-09-06 — End: 2023-09-06

## 2023-09-06 MED ORDER — EPINEPHRINE 0.3 MG/0.3ML IJ SOAJ: 0.3000 mg | Freq: Once | INTRAMUSCULAR | Status: DC | PRN

## 2023-09-06 MED ORDER — HEPARIN SOD (PORK) LOCK FLUSH 100 UNIT/ML IV SOLN: 500.0000 [IU] | Freq: Once | INTRAVENOUS | Status: DC | PRN

## 2023-09-06 NOTE — Patient Instructions (Signed)
 Durvalumab Injection What is this medication? DURVALUMAB (dur VAL ue mab) treats some types of cancer. It works by helping your immune system slow or stop the spread of cancer cells. It is a monoclonal antibody. This medicine may be used for other purposes; ask your health care provider or pharmacist if you have questions. COMMON BRAND NAME(S): IMFINZI What should I tell my care team before I take this medication? They need to know if you have any of these conditions: Allogeneic stem cell transplant (uses someone else's stem cells) Autoimmune diseases, such as Crohn disease, ulcerative colitis, lupus History of chest radiation Nervous system problems, such as Guillain-Barre syndrome, myasthenia gravis Organ transplant An unusual or allergic reaction to durvalumab, other medications, foods, dyes, or preservatives Pregnant or trying to get pregnant Breast-feeding How should I use this medication? This medication is infused into a vein. It is given by your care team in a hospital or clinic setting. A special MedGuide will be given to you before each treatment. Be sure to read this information carefully each time. Talk to your care team about the use of this medication in children. Special care may be needed. Overdosage: If you think you have taken too much of this medicine contact a poison control center or emergency room at once. NOTE: This medicine is only for you. Do not share this medicine with others. What if I miss a dose? Keep appointments for follow-up doses. It is important not to miss your dose. Call your care team if you are unable to keep an appointment. What may interact with this medication? Interactions have not been studied. This list may not describe all possible interactions. Give your health care provider a list of all the medicines, herbs, non-prescription drugs, or dietary supplements you use. Also tell them if you smoke, drink alcohol, or use illegal drugs. Some items may  interact with your medicine. What should I watch for while using this medication? Your condition will be monitored carefully while you are receiving this medication. You may need blood work while taking this medication. This medication may cause serious skin reactions. They can happen weeks to months after starting the medication. Contact your care team right away if you notice fevers or flu-like symptoms with a rash. The rash may be red or purple and then turn into blisters or peeling of the skin. You may also notice a red rash with swelling of the face, lips, or lymph nodes in your neck or under your arms. Tell your care team right away if you have any change in your eyesight. Talk to your care team if you may be pregnant. Serious birth defects can occur if you take this medication during pregnancy and for 3 months after the last dose. You will need a negative pregnancy test before starting this medication. Contraception is recommended while taking this medication and for 3 months after the last dose. Your care team can help you find the option that works for you. Do not breastfeed while taking this medication and for 3 months after the last dose. What side effects may I notice from receiving this medication? Side effects that you should report to your care team as soon as possible: Allergic reactions--skin rash, itching, hives, swelling of the face, lips, tongue, or throat Dry cough, shortness of breath or trouble breathing Eye pain, redness, irritation, or discharge with blurry or decreased vision Heart muscle inflammation--unusual weakness or fatigue, shortness of breath, chest pain, fast or irregular heartbeat, dizziness, swelling of the  ankles, feet, or hands Hormone gland problems--headache, sensitivity to light, unusual weakness or fatigue, dizziness, fast or irregular heartbeat, increased sensitivity to cold or heat, excessive sweating, constipation, hair loss, increased thirst or amount of  urine, tremors or shaking, irritability Infusion reactions--chest pain, shortness of breath or trouble breathing, feeling faint or lightheaded Kidney injury (glomerulonephritis)--decrease in the amount of urine, red or dark brown urine, foamy or bubbly urine, swelling of the ankles, hands, or feet Liver injury--right upper belly pain, loss of appetite, nausea, light-colored stool, dark yellow or brown urine, yellowing skin or eyes, unusual weakness or fatigue Pain, tingling, or numbness in the hands or feet, muscle weakness, change in vision, confusion or trouble speaking, loss of balance or coordination, trouble walking, seizures Rash, fever, and swollen lymph nodes Redness, blistering, peeling, or loosening of the skin, including inside the mouth Sudden or severe stomach pain, bloody diarrhea, fever, nausea, vomiting Side effects that usually do not require medical attention (report these to your care team if they continue or are bothersome): Bone, joint, or muscle pain Diarrhea Fatigue Loss of appetite Nausea Skin rash This list may not describe all possible side effects. Call your doctor for medical advice about side effects. You may report side effects to FDA at 1-800-FDA-1088. Where should I keep my medication? This medication is given in a hospital or clinic. It will not be stored at home. NOTE: This sheet is a summary. It may not cover all possible information. If you have questions about this medicine, talk to your doctor, pharmacist, or health care provider.  2024 Elsevier/Gold Standard (2021-06-21 00:00:00)

## 2023-09-06 NOTE — Telephone Encounter (Signed)
 Patient has been scheduled for follow-up visit per 09/06/23 LOS.  Pt given an appt calendar with date and time.

## 2023-09-07 ENCOUNTER — Other Ambulatory Visit: Payer: Self-pay

## 2023-09-23 ENCOUNTER — Other Ambulatory Visit: Payer: Self-pay | Admitting: Gastroenterology

## 2023-10-04 ENCOUNTER — Inpatient Hospital Stay (HOSPITAL_BASED_OUTPATIENT_CLINIC_OR_DEPARTMENT_OTHER): Attending: Internal Medicine | Admitting: Hematology and Oncology

## 2023-10-04 ENCOUNTER — Inpatient Hospital Stay: Attending: Internal Medicine

## 2023-10-04 ENCOUNTER — Inpatient Hospital Stay

## 2023-10-04 VITALS — BP 109/61 | HR 84 | Temp 97.7°F | Resp 16 | Ht 67.0 in | Wt 138.2 lb

## 2023-10-04 DIAGNOSIS — Z5112 Encounter for antineoplastic immunotherapy: Secondary | ICD-10-CM | POA: Insufficient documentation

## 2023-10-04 DIAGNOSIS — C3491 Malignant neoplasm of unspecified part of right bronchus or lung: Secondary | ICD-10-CM

## 2023-10-04 DIAGNOSIS — C342 Malignant neoplasm of middle lobe, bronchus or lung: Secondary | ICD-10-CM | POA: Diagnosis not present

## 2023-10-04 DIAGNOSIS — Z79899 Other long term (current) drug therapy: Secondary | ICD-10-CM | POA: Insufficient documentation

## 2023-10-04 LAB — CBC WITH DIFFERENTIAL (CANCER CENTER ONLY)
Abs Immature Granulocytes: 0.02 K/uL (ref 0.00–0.07)
Basophils Absolute: 0 K/uL (ref 0.0–0.1)
Basophils Relative: 1 %
Eosinophils Absolute: 0.2 K/uL (ref 0.0–0.5)
Eosinophils Relative: 4 %
HCT: 40.9 % (ref 36.0–46.0)
Hemoglobin: 13.3 g/dL (ref 12.0–15.0)
Immature Granulocytes: 0 %
Lymphocytes Relative: 21 %
Lymphs Abs: 1.2 K/uL (ref 0.7–4.0)
MCH: 29.5 pg (ref 26.0–34.0)
MCHC: 32.5 g/dL (ref 30.0–36.0)
MCV: 90.7 fL (ref 80.0–100.0)
Monocytes Absolute: 0.7 K/uL (ref 0.1–1.0)
Monocytes Relative: 12 %
Neutro Abs: 3.7 K/uL (ref 1.7–7.7)
Neutrophils Relative %: 62 %
Platelet Count: 243 K/uL (ref 150–400)
RBC: 4.51 MIL/uL (ref 3.87–5.11)
RDW: 13.6 % (ref 11.5–15.5)
WBC Count: 5.9 K/uL (ref 4.0–10.5)
nRBC: 0 % (ref 0.0–0.2)

## 2023-10-04 LAB — CMP (CANCER CENTER ONLY)
ALT: 22 U/L (ref 0–44)
AST: 30 U/L (ref 15–41)
Albumin: 4.1 g/dL (ref 3.5–5.0)
Alkaline Phosphatase: 79 U/L (ref 38–126)
Anion gap: 11 (ref 5–15)
BUN: 11 mg/dL (ref 8–23)
CO2: 25 mmol/L (ref 22–32)
Calcium: 9.1 mg/dL (ref 8.9–10.3)
Chloride: 102 mmol/L (ref 98–111)
Creatinine: 0.85 mg/dL (ref 0.44–1.00)
GFR, Estimated: >60 mL/min (ref >60)
Glucose, Bld: 107 mg/dL — ABNORMAL HIGH (ref 70–99)
Potassium: 4 mmol/L (ref 3.5–5.1)
Sodium: 138 mmol/L (ref 135–145)
Total Bilirubin: 0.4 mg/dL (ref 0.0–1.2)
Total Protein: 6.9 g/dL (ref 6.5–8.1)

## 2023-10-04 LAB — TSH: TSH: 4.483 u[IU]/mL (ref 0.350–4.500)

## 2023-10-04 MED ORDER — SODIUM CHLORIDE 0.9 % IV SOLN: Freq: Once | INTRAVENOUS | Status: AC

## 2023-10-04 MED ORDER — SODIUM CHLORIDE 0.9 % IV SOLN
1500.0000 mg | Freq: Once | INTRAVENOUS | Status: AC
  Administered 2023-10-04: 1500 mg via INTRAVENOUS
  Filled 2023-10-04: qty 30

## 2023-10-04 NOTE — Patient Instructions (Signed)
 Durvalumab Injection What is this medication? DURVALUMAB (dur VAL ue mab) treats some types of cancer. It works by helping your immune system slow or stop the spread of cancer cells. It is a monoclonal antibody. This medicine may be used for other purposes; ask your health care provider or pharmacist if you have questions. COMMON BRAND NAME(S): IMFINZI What should I tell my care team before I take this medication? They need to know if you have any of these conditions: Allogeneic stem cell transplant (uses someone else's stem cells) Autoimmune diseases, such as Crohn disease, ulcerative colitis, lupus History of chest radiation Nervous system problems, such as Guillain-Barre syndrome, myasthenia gravis Organ transplant An unusual or allergic reaction to durvalumab, other medications, foods, dyes, or preservatives Pregnant or trying to get pregnant Breast-feeding How should I use this medication? This medication is infused into a vein. It is given by your care team in a hospital or clinic setting. A special MedGuide will be given to you before each treatment. Be sure to read this information carefully each time. Talk to your care team about the use of this medication in children. Special care may be needed. Overdosage: If you think you have taken too much of this medicine contact a poison control center or emergency room at once. NOTE: This medicine is only for you. Do not share this medicine with others. What if I miss a dose? Keep appointments for follow-up doses. It is important not to miss your dose. Call your care team if you are unable to keep an appointment. What may interact with this medication? Interactions have not been studied. This list may not describe all possible interactions. Give your health care provider a list of all the medicines, herbs, non-prescription drugs, or dietary supplements you use. Also tell them if you smoke, drink alcohol, or use illegal drugs. Some items may  interact with your medicine. What should I watch for while using this medication? Your condition will be monitored carefully while you are receiving this medication. You may need blood work while taking this medication. This medication may cause serious skin reactions. They can happen weeks to months after starting the medication. Contact your care team right away if you notice fevers or flu-like symptoms with a rash. The rash may be red or purple and then turn into blisters or peeling of the skin. You may also notice a red rash with swelling of the face, lips, or lymph nodes in your neck or under your arms. Tell your care team right away if you have any change in your eyesight. Talk to your care team if you may be pregnant. Serious birth defects can occur if you take this medication during pregnancy and for 3 months after the last dose. You will need a negative pregnancy test before starting this medication. Contraception is recommended while taking this medication and for 3 months after the last dose. Your care team can help you find the option that works for you. Do not breastfeed while taking this medication and for 3 months after the last dose. What side effects may I notice from receiving this medication? Side effects that you should report to your care team as soon as possible: Allergic reactions--skin rash, itching, hives, swelling of the face, lips, tongue, or throat Dry cough, shortness of breath or trouble breathing Eye pain, redness, irritation, or discharge with blurry or decreased vision Heart muscle inflammation--unusual weakness or fatigue, shortness of breath, chest pain, fast or irregular heartbeat, dizziness, swelling of the  ankles, feet, or hands Hormone gland problems--headache, sensitivity to light, unusual weakness or fatigue, dizziness, fast or irregular heartbeat, increased sensitivity to cold or heat, excessive sweating, constipation, hair loss, increased thirst or amount of  urine, tremors or shaking, irritability Infusion reactions--chest pain, shortness of breath or trouble breathing, feeling faint or lightheaded Kidney injury (glomerulonephritis)--decrease in the amount of urine, red or dark brown urine, foamy or bubbly urine, swelling of the ankles, hands, or feet Liver injury--right upper belly pain, loss of appetite, nausea, light-colored stool, dark yellow or brown urine, yellowing skin or eyes, unusual weakness or fatigue Pain, tingling, or numbness in the hands or feet, muscle weakness, change in vision, confusion or trouble speaking, loss of balance or coordination, trouble walking, seizures Rash, fever, and swollen lymph nodes Redness, blistering, peeling, or loosening of the skin, including inside the mouth Sudden or severe stomach pain, bloody diarrhea, fever, nausea, vomiting Side effects that usually do not require medical attention (report these to your care team if they continue or are bothersome): Bone, joint, or muscle pain Diarrhea Fatigue Loss of appetite Nausea Skin rash This list may not describe all possible side effects. Call your doctor for medical advice about side effects. You may report side effects to FDA at 1-800-FDA-1088. Where should I keep my medication? This medication is given in a hospital or clinic. It will not be stored at home. NOTE: This sheet is a summary. It may not cover all possible information. If you have questions about this medicine, talk to your doctor, pharmacist, or health care provider.  2024 Elsevier/Gold Standard (2021-06-21 00:00:00)

## 2023-10-04 NOTE — Progress Notes (Signed)
 [facility]  [address] [place],  [place]  [address] [phone]  Clinic Day:  09/06/2023  Referring physician: Nicholaus Credit, PA-C   HISTORY OF PRESENT ILLNESS:  The patient is a 73 y.o. 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  She comes in today to be evaluated before heading into her 12th cycle of durvalumab .  She claims to have tolerated her 11th cycle very well. She states her swallowing is more difficult She has chronic coughing, which has gotten better, but denies having any new respiratory symptoms which concern her for progression of her lung cancer.  With respect to her lung cancer history, she initially underwent a right middle lobectomy in February 2022.  Due to the mediastinal nodal recurrence in May 2024, the patient underwent definitive chemoradiation with weekly carboplatin /paclitaxel , which was completed in late July 2024.  Since then, she has been on maintenance durvalumab , for which she continues to take.    PHYSICAL EXAM:  Blood pressure 109/61, pulse 84, temperature 97.7 F (36.5 C), temperature source Oral, resp. rate 16, height 5' 7 (1.702 m), weight 138 lb 3.2 oz (62.7 kg), SpO2 98%. Wt Readings from Last 3 Encounters:  10/04/23 138 lb 3.2 oz (62.7 kg)  09/06/23 134 lb 8 oz (61 kg)  08/09/23 133 lb 11.2 oz (60.6 kg)   Body mass index is 21.65 kg/m. Performance status (ECOG): 1 Physical Exam Constitutional:      Appearance: Normal appearance. She is not ill-appearing.     Comments: She continues to look physically better with successive visits  HENT:     Nose: No rhinorrhea.     Mouth/Throat:     Mouth: Mucous membranes are moist.     Pharynx: Oropharynx is clear. No oropharyngeal exudate or posterior oropharyngeal erythema.  Cardiovascular:     Rate and Rhythm: Normal rate and regular rhythm.     Heart sounds: No murmur heard.    No friction rub. No gallop.   Pulmonary:     Effort: Pulmonary effort is normal. No respiratory distress.     Breath sounds: Decreased air movement present. Examination of the right-upper field reveals decreased breath sounds. Examination of the left-upper field reveals decreased breath sounds. Decreased breath sounds present. No wheezing, rhonchi or rales.  Abdominal:     General: Bowel sounds are normal. There is no distension.     Palpations: Abdomen is soft. There is no mass.     Tenderness: There is no abdominal tenderness.  Musculoskeletal:        General: No swelling.     Right lower leg: No edema.     Left lower leg: No edema.  Lymphadenopathy:     Cervical: No cervical adenopathy.     Upper Body:     Right upper body: No supraclavicular or axillary adenopathy.     Left upper body: No supraclavicular or axillary adenopathy.     Lower Body: No right inguinal adenopathy. No left inguinal adenopathy.  Skin:    General: Skin is warm.     Coloration: Skin is not jaundiced.     Findings: No lesion or rash.  Neurological:     General: No focal deficit present.     Mental Status: She is alert and oriented to person, place, and time. Mental status is at baseline.  Psychiatric:        Mood and Affect: Mood normal.  Behavior: Behavior normal.        Thought Content: Thought content normal.    LABS:      Latest Ref Rng & Units 10/04/2023    8:05 AM 09/06/2023    8:45 AM 08/09/2023    9:08 AM  CBC  WBC 4.0 - 10.5 K/uL 5.9  5.7  7.5   Hemoglobin 12.0 - 15.0 g/dL 86.6  87.1  86.8   Hematocrit 36.0 - 46.0 % 40.9  38.3  40.7   Platelets 150 - 400 K/uL 243  230  245       Latest Ref Rng & Units 10/04/2023    8:05 AM 09/06/2023    8:45 AM 08/09/2023    9:08 AM  CMP  Glucose 70 - 99 mg/dL 892  836  890   BUN 8 - 23 mg/dL 11  16  12    Creatinine 0.44 - 1.00 mg/dL 9.14  9.12  9.16   Sodium 135 - 145 mmol/L 138  137  138   Potassium 3.5 - 5.1 mmol/L 4.0  3.7  4.1   Chloride 98 - 111 mmol/L 102  102  102    CO2 22 - 32 mmol/L 25  23  25    Calcium  8.9 - 10.3 mg/dL 9.1  8.9  9.4   Total Protein 6.5 - 8.1 g/dL 6.9  6.8  6.8   Total Bilirubin 0.0 - 1.2 mg/dL 0.4  0.4  0.4   Alkaline Phos 38 - 126 U/L 79  90  89   AST 15 - 41 U/L 30  30  22    ALT 0 - 44 U/L 22  25  15     ASSESSMENT & PLAN:  A 73 y.o. 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 She will proceed with her 12th cycle of durvalumab  immunotherapy today.  Clinically, the patient appears to be doing well. She notes that she is scheduled for a CT chest after this cycle for evaluation of disease after completion of treatment.She is requesting a PET scan instead of CT imaging. I will discuss with Dr. Ezzard. We will see this patient back in 4 weeks before she heads into her 12th and final cycle of maintenance durvalumab  immunotherapy.  The patient understands all the plans discussed today and is in agreement with them.  Eleanor DELENA Bach, NP

## 2023-10-05 ENCOUNTER — Other Ambulatory Visit: Payer: Self-pay

## 2023-10-05 LAB — T4: T4, Total: 8.1 ug/dL (ref 4.5–12.0)

## 2023-10-17 ENCOUNTER — Telehealth: Payer: Self-pay

## 2023-10-17 NOTE — Telephone Encounter (Signed)
 Pt saw me in 12/24 for breast pain and advised to call back to schedule diagnostic mammogram at that time I have not seen patient since 02/28/23 and she is overdue for followup Please have her make an appt with me and then we can also get imaging scheduled

## 2023-10-17 NOTE — Telephone Encounter (Signed)
 Patient stated that she is ready for provider to set up her mammogram, she was told to call back when she was ready. [facility] would [facility]. She stated she has an appointment on 11/08/23, she can do any day but that day.   Copied from CRM #[id number]. Topic: Appointments - Appointment Scheduling >> Oct 17, 2023  9:02 AM Pinkey ORN wrote: Patient/patient representative is calling to schedule an appointment. Refer to attachments for appointment information. >> Oct 17, 2023  9:02 AM Pinkey ORN wrote: Patient is requesting a call back, states she's ready to schedule her breast diagnostic.

## 2023-10-17 NOTE — Telephone Encounter (Signed)
 Called patient and made appointment

## 2023-11-01 ENCOUNTER — Encounter: Payer: Self-pay | Admitting: Physician Assistant

## 2023-11-01 ENCOUNTER — Ambulatory Visit (INDEPENDENT_AMBULATORY_CARE_PROVIDER_SITE_OTHER): Admitting: Physician Assistant

## 2023-11-01 VITALS — BP 112/72 | HR 95 | Temp 97.9°F | Ht 67.0 in | Wt 137.0 lb

## 2023-11-01 DIAGNOSIS — I700 Atherosclerosis of aorta: Secondary | ICD-10-CM | POA: Diagnosis not present

## 2023-11-01 DIAGNOSIS — R5383 Other fatigue: Secondary | ICD-10-CM

## 2023-11-01 DIAGNOSIS — C3491 Malignant neoplasm of unspecified part of right bronchus or lung: Secondary | ICD-10-CM | POA: Diagnosis not present

## 2023-11-01 DIAGNOSIS — E782 Mixed hyperlipidemia: Secondary | ICD-10-CM

## 2023-11-01 DIAGNOSIS — R739 Hyperglycemia, unspecified: Secondary | ICD-10-CM

## 2023-11-01 DIAGNOSIS — E559 Vitamin D deficiency, unspecified: Secondary | ICD-10-CM

## 2023-11-01 DIAGNOSIS — K449 Diaphragmatic hernia without obstruction or gangrene: Secondary | ICD-10-CM | POA: Diagnosis not present

## 2023-11-01 DIAGNOSIS — J060 Acute laryngopharyngitis: Secondary | ICD-10-CM | POA: Diagnosis not present

## 2023-11-01 DIAGNOSIS — J449 Chronic obstructive pulmonary disease, unspecified: Secondary | ICD-10-CM | POA: Diagnosis not present

## 2023-11-01 DIAGNOSIS — N644 Mastodynia: Secondary | ICD-10-CM

## 2023-11-01 MED ORDER — AZITHROMYCIN 250 MG PO TABS: ORAL_TABLET | ORAL | 0 refills | Status: AC

## 2023-11-01 NOTE — Progress Notes (Signed)
 Subjective:  Patient ID: [name], female    DOB: [birth year]  Age: 73 y.o. MRN: [id number]  Chief Complaint  Patient presents with   Medical Management of Chronic Issues    Hyperlipidemia    Pt with GERD and hiatal hernia- symptoms currently stable on protonix  40mg  qd - states helps but is having some mild dysphagia - will discuss with hematologist at next visit and does not want GI referral at this time  Pt with  lung carcinoma - had a partial lobectomy 03/2020 - The cancer came back and she is currently under treatment from Dr Dewey (radiation) and Dr Emery (oncology)  and is scheduled for CT next week  Pt with  COPD - doing well on Trelegy  Pt with hyperlipidemia and aortic atherosclerosis - pt stopped crestor  and is due for labwork - she states since starting cancer treatment she has not been taking her medication regularly  Pt complains of mild sore throat and productive cough for the past 2 days -  States she would like rx for antibiotic because these symptoms usually turn into bronchitis for patient She has not had fever but some mild malaise  Pt complains of intermittent bilateral breast pain - she is due for mammogram but discussed given symptoms and cancer diagnosis recommend diagnostic bilateral mammogram Current Outpatient Medications on File Prior to Visit  Medication Sig Dispense Refill   benzonatate  (TESSALON ) 100 MG capsule Take 1 capsule (100 mg total) by mouth 3 (three) times daily as needed for cough. 30 capsule 1   Fluticasone -Umeclidin-Vilant (TRELEGY ELLIPTA ) 100-62.5-25 MCG/ACT AEPB Inhale 1 puff into the lungs daily. 2 each 0   lidocaine -prilocaine  (EMLA ) cream Apply 1 Application topically as needed. Apply 1 hour prior to lab appt. Cover with plastic wrap, 30 g 0   Multiple Vitamin (MULTIVITAMIN) tablet Take 1 tablet by mouth daily.     mupirocin  ointment (BACTROBAN ) 2 % Apply 1 application topically 2 (two) times daily. 22 g 2   ondansetron  (ZOFRAN -ODT)  4 MG disintegrating tablet Take 1 tablet (4 mg total) by mouth every 8 (eight) hours as needed. 20 tablet 0   pantoprazole  (PROTONIX ) 40 MG tablet TAKE 1 TABLET BY MOUTH TWICE A DAY 180 tablet 2   polyethylene glycol (MIRALAX / GLYCOLAX) 17 g packet Take 17 g by mouth daily as needed for moderate constipation.     prochlorperazine  (COMPAZINE ) 25 MG suppository Place 1 suppository (25 mg total) rectally every 12 (twelve) hours. 30 suppository 1   promethazine  (PHENERGAN ) 25 MG tablet Take 1 tablet (25 mg total) by mouth every 6 (six) hours as needed for nausea. 30 tablet 3   No current facility-administered medications on file prior to visit.   Past Medical History:  Diagnosis Date   Abnormal chest x-ray 01/19/2020   Acute laryngopharyngitis 03/18/2020   Aortic atherosclerosis (HCC) 01/28/2020   Arthritis    Cardiac murmur 03/01/2020   COPD (chronic obstructive pulmonary disease) (HCC)    Coronary artery calcification seen on CT scan 03/01/2020   Cough 01/12/2020   COVID-19 03/10/2020   Dyspnea    on exertion,after getting covid   Family history of adverse reaction to anesthesia    brothr had n/v   GERD (gastroesophageal reflux disease)    Hiatal hernia    Hyperlipemia    Lung cancer, middle lobe (HCC)    middle lobe removed   Pneumonia    PONV (postoperative nausea and vomiting)    Pre-operative cardiovascular examination 03/01/2020  Right lower lobe lung mass 01/28/2020   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 Mild sigmoid diverticulosis   ESOPHAGOGASTRODUODENOSCOPY  05/15/2007   Hiatal hernia. Mild gastritis. Submucosal antral lesion (questionable importance)   ESOPHAGOGASTRODUODENOSCOPY (EGD) WITH PROPOFOL  N/A 10/07/2022   Procedure: ESOPHAGOGASTRODUODENOSCOPY (EGD) WITH PROPOFOL ;  Surgeon: Charlanne Groom, MD;  Location: [facility];  Service: Gastroenterology;  Laterality: N/A;   ESOPHAGOGASTRODUODENOSCOPY (EGD) WITH PROPOFOL  N/A 04/30/2023   Procedure: ESOPHAGOGASTRODUODENOSCOPY (EGD) WITH PROPOFOL ;  Surgeon: Charlanne Groom, MD;  Location: [facility];  Service: Gastroenterology;  Laterality: N/A;   IR IMAGING GUIDED PORT INSERTION  10/10/2022   IR US  GUIDE VASC ACCESS RIGHT  10/10/2022   IR VENIPUNCTURE 6YRS/OLDER BY MD  10/10/2022   LUNG REMOVAL, PARTIAL  03/29/2020   middle lobe removed   VIDEO BRONCHOSCOPY WITH ENDOBRONCHIAL ULTRASOUND N/A 07/20/2022   Procedure: VIDEO BRONCHOSCOPY WITH ENDOBRONCHIAL ULTRASOUND;  Surgeon: Kerrin Elspeth BROCKS, MD;  Location: [facility] OR;  Service: Thoracic;  Laterality: N/A;    Family History  Problem Relation Age of Onset   Heart disease Mother    Heart attack Mother    Stomach cancer Father    Cancer Father    Diabetes Father    Non-Hodgkin's lymphoma Brother    High Cholesterol Brother    High Cholesterol Brother    Heart disease Brother    Heart attack Brother    Colon cancer Neg Hx    Esophageal cancer Neg Hx    Pancreatic cancer Neg Hx    Social History   Socioeconomic History   Marital status: Widowed    Spouse name: Not on file   Number of children: 1   Years of education: Not on file   Highest education level: GED or equivalent  Occupational History   Not on file  Tobacco Use   Smoking status: Former    Current packs/day: 0.00    Types: Cigarettes    Start date: 03/23/1967    Quit date: 03/22/2017    Years since quitting: 6.6   Smokeless tobacco: Never  Vaping Use   Vaping status: Never Used   Substance and Sexual Activity   Alcohol use: Never   Drug use: Never   Sexual activity: Not Currently  Other Topics Concern   Not on file  Social History Narrative   Not on file   Social Drivers of Health   Financial Resource Strain: Low Risk  (02/05/2023)   Overall Financial Resource Strain (CARDIA)    Difficulty of Paying Living Expenses: Not hard at all  Food Insecurity: No Food Insecurity (02/05/2023)   Hunger Vital Sign    Worried About Running Out of Food in the Last Year: Never true    Ran Out of Food in the Last Year: Never true  Transportation Needs: No Transportation Needs (02/05/2023)   PRAPARE - Administrator, Civil Service (Medical): No    Lack of Transportation (Non-Medical): No  Physical Activity: Insufficiently Active (02/05/2023)   Exercise Vital Sign    Days of  Exercise per Week: 4 days    Minutes of Exercise per Session: 10 min  Stress: No Stress Concern Present (02/05/2023)   [employer] of Occupational Health - Occupational Stress Questionnaire    Feeling of Stress : Not at all  Social Connections: Moderately Isolated (02/05/2023)   Social Connection and Isolation Panel    Frequency of Communication with Friends and Family: More than three times a week    Frequency of Social Gatherings with Friends and Family: More than three times a week    Attends Religious Services: 1 to 4 times per year    Active Member of [employer] or Organizations: No    Attends Banker Meetings: Never    Marital Status: Widowed  CONSTITUTIONAL: Negative for chills, fatigue, fever, unintentional weight gain and unintentional weight loss.  E/N/T: Negative for ear pain, nasal congestion and sore throat.  CARDIOVASCULAR: Negative for chest pain, dizziness, palpitations and pedal edema.  RESPIRATORY: Negative for recent cough and dyspnea.  Breasts - see HPI GASTROINTESTINAL: see HPI MSK: Negative for arthralgias and myalgias.  INTEGUMENTARY: Negative for  rash.  PSYCHIATRIC: Negative for sleep disturbance and to question depression screen.  Negative for depression, negative for anhedonia.       Objective:  PHYSICAL EXAM:   VS: BP 112/72 (BP Location: Left Arm, Patient Position: Sitting)   Pulse 95   Temp 97.9 F (36.6 C) (Temporal)   Ht 5' 7 (1.702 m)   Wt 137 lb (62.1 kg)   SpO2 98%   BMI 21.46 kg/m   GEN: Well nourished, well developed, in no acute distress  Cardiac: RRR; no murmurs, rubs, or gallops,no edema -  Respiratory:  normal respiratory rate and pattern with no distress - normal breath sounds with no rales, rhonchi, wheezes or rubs MS: no deformity or atrophy  Skin: warm and dry, no rash  Neuro:  Alert and Oriented x 3, - CN II-Xii grossly intact Psych: euthymic mood, appropriate affect and demeanor   Lab Results  Component Value Date   WBC 5.9 10/04/2023   HGB 13.3 10/04/2023   HCT 40.9 10/04/2023   PLT 243 10/04/2023   GLUCOSE 107 (H) 10/04/2023   CHOL 219 (H) 08/22/2022   TRIG 147 08/22/2022   HDL 46 08/22/2022   LDLCALC 146 (H) 08/22/2022   ALT 22 10/04/2023   AST 30 10/04/2023   NA 138 10/04/2023   K 4.0 10/04/2023   CL 102 10/04/2023   CREATININE 0.85 10/04/2023   BUN 11 10/04/2023   CO2 25 10/04/2023   TSH 4.483 10/04/2023   INR 0.9 07/18/2022      Assessment & Plan:   Problem List Items Addressed This Visit       Cardiovascular and Mediastinum   Aortic atherosclerosis (HCC) Lipid panel pending     Respiratory   Primary lung cancer, right middle lobe - resected Continue follow up with specialists     Other   Hiatal hernia Continue protonix  40 mg Follow up with GI    Hyperlipemia - Primary   Relevant Orders   CBC with Differential/Platelet   Comprehensive metabolic panel   Lipid panel    Other Visit Diagnoses     Vitamin D  deficiency       Relevant Orders   VITAMIN D  25 Hydroxy (Vit-D Deficiency, Fractures)      Acute laryngopharyngitis Rx zpack      Bilateral  breast pain Bilateral diagnostic mammogram ordered  COPD Continue inhalers  Fatigue Labwork pending  Hyperglycemia Watch diet Hgb A1c pending             .  Meds ordered this encounter  Medications   azithromycin  (ZITHROMAX ) 250 MG tablet    Sig: Take 2 tablets on day 1, then 1 tablet daily on days 2 through 5    Dispense:  6 tablet    Refill:  0    Supervising Provider:   COX, KIRSTEN [[id number]]    Orders Placed This Encounter  Procedures   MM Digital Diagnostic Bilat   CBC with Differential/Platelet   Comprehensive metabolic panel with GFR   Hemoglobin A1c   Lipid panel   TSH   VITAMIN D  25 Hydroxy (Vit-D Deficiency, Fractures)     Follow-up: Return in about 6 months (around 04/30/2024) for chronic fasting follow-up -- schedule in flu clinic also.  An After Visit Summary was printed and given to the patient.  CAMIE JONELLE NICHOLAUS DEVONNA [facility] [phone]

## 2023-11-02 ENCOUNTER — Other Ambulatory Visit: Payer: Self-pay

## 2023-11-05 ENCOUNTER — Telehealth: Payer: Self-pay

## 2023-11-05 NOTE — Telephone Encounter (Signed)
Spoke with patient, verbalized understanding and had no questions at this time.  

## 2023-11-05 NOTE — Telephone Encounter (Signed)
 Copied from CRM [id number]. Topic: Clinical - Request for Lab/Test Order >> Nov 05, 2023 10:07 AM Tobias CROME wrote: Reason for CRM: Patient is needing a regular mammogram first before doing the diagnostic mammogram. Patient spoke to insurance and informed it regular mammogram is completed first and then the diagnostic mammogram it would be covered at 100%.   If patient were to have the diagnostic mammogram she would have to pay out of pocket up to $75, depending on where she goes.   Patient requesting regular mammogram to be completed first so that insurance will cover it for her.   Please assist patient further

## 2023-11-05 NOTE — Telephone Encounter (Signed)
 Pt is having/has had bilateral breast pain --- have explained to pt that given her symptoms radiology  will require a diagnositic mammogram and will not schedule a screening mammogram

## 2023-11-07 DIAGNOSIS — Z4689 Encounter for fitting and adjustment of other specified devices: Secondary | ICD-10-CM | POA: Diagnosis not present

## 2023-11-08 ENCOUNTER — Inpatient Hospital Stay: Attending: Internal Medicine

## 2023-11-08 ENCOUNTER — Other Ambulatory Visit

## 2023-11-08 ENCOUNTER — Inpatient Hospital Stay (HOSPITAL_BASED_OUTPATIENT_CLINIC_OR_DEPARTMENT_OTHER)
Admission: RE | Admit: 2023-11-08 | Discharge: 2023-11-08 | Disposition: A | Discharge status: Home / Self Care | Source: Ambulatory Visit | Attending: Oncology | Admitting: Oncology

## 2023-11-08 ENCOUNTER — Encounter (HOSPITAL_BASED_OUTPATIENT_CLINIC_OR_DEPARTMENT_OTHER): Payer: Self-pay | Admitting: Radiology

## 2023-11-08 DIAGNOSIS — Z9221 Personal history of antineoplastic chemotherapy: Secondary | ICD-10-CM | POA: Insufficient documentation

## 2023-11-08 DIAGNOSIS — R1310 Dysphagia, unspecified: Secondary | ICD-10-CM | POA: Diagnosis not present

## 2023-11-08 DIAGNOSIS — Z85118 Personal history of other malignant neoplasm of bronchus and lung: Secondary | ICD-10-CM | POA: Diagnosis not present

## 2023-11-08 DIAGNOSIS — C3491 Malignant neoplasm of unspecified part of right bronchus or lung: Secondary | ICD-10-CM

## 2023-11-08 DIAGNOSIS — Z923 Personal history of irradiation: Secondary | ICD-10-CM | POA: Insufficient documentation

## 2023-11-08 DIAGNOSIS — R053 Chronic cough: Secondary | ICD-10-CM | POA: Insufficient documentation

## 2023-11-08 LAB — CBC WITH DIFFERENTIAL (CANCER CENTER ONLY)
Abs Immature Granulocytes: 0.02 K/uL (ref 0.00–0.07)
Basophils Absolute: 0 K/uL (ref 0.0–0.1)
Basophils Relative: 1 %
Eosinophils Absolute: 0.2 K/uL (ref 0.0–0.5)
Eosinophils Relative: 3 %
HCT: 41.2 % (ref 36.0–46.0)
Hemoglobin: 13.8 g/dL (ref 12.0–15.0)
Immature Granulocytes: 0 %
Lymphocytes Relative: 15 %
Lymphs Abs: 1 K/uL (ref 0.7–4.0)
MCH: 30.3 pg (ref 26.0–34.0)
MCHC: 33.5 g/dL (ref 30.0–36.0)
MCV: 90.4 fL (ref 80.0–100.0)
Monocytes Absolute: 0.5 K/uL (ref 0.1–1.0)
Monocytes Relative: 8 %
Neutro Abs: 4.7 K/uL (ref 1.7–7.7)
Neutrophils Relative %: 73 %
Platelet Count: 274 K/uL (ref 150–400)
RBC: 4.56 MIL/uL (ref 3.87–5.11)
RDW: 13.2 % (ref 11.5–15.5)
WBC Count: 6.4 K/uL (ref 4.0–10.5)
nRBC: 0 % (ref 0.0–0.2)

## 2023-11-08 LAB — CMP (CANCER CENTER ONLY)
ALT: 21 U/L (ref 0–44)
AST: 26 U/L (ref 15–41)
Albumin: 3.8 g/dL (ref 3.5–5.0)
Alkaline Phosphatase: 82 U/L (ref 38–126)
Anion gap: 13 (ref 5–15)
BUN: 11 mg/dL (ref 8–23)
CO2: 24 mmol/L (ref 22–32)
Calcium: 9.2 mg/dL (ref 8.9–10.3)
Chloride: 102 mmol/L (ref 98–111)
Creatinine: 0.83 mg/dL (ref 0.44–1.00)
GFR, Estimated: >60 mL/min (ref >60)
Glucose, Bld: 107 mg/dL — ABNORMAL HIGH (ref 70–99)
Potassium: 3.8 mmol/L (ref 3.5–5.1)
Sodium: 139 mmol/L (ref 135–145)
Total Bilirubin: 0.4 mg/dL (ref 0.0–1.2)
Total Protein: 7.1 g/dL (ref 6.5–8.1)

## 2023-11-08 MED ORDER — IOHEXOL 300 MG/ML  SOLN
100.0000 mL | Freq: Once | INTRAMUSCULAR | Status: AC | PRN
  Administered 2023-11-08: 75 mL via INTRAVENOUS

## 2023-11-08 MED ORDER — HEPARIN SOD (PORK) LOCK FLUSH 100 UNIT/ML IV SOLN
500.0000 [IU] | Freq: Once | INTRAVENOUS | Status: AC
  Administered 2023-11-08: 500 [IU] via INTRAVENOUS

## 2023-11-09 ENCOUNTER — Telehealth: Payer: Self-pay

## 2023-11-09 ENCOUNTER — Telehealth: Payer: Self-pay | Admitting: Oncology

## 2023-11-09 NOTE — Telephone Encounter (Signed)
 Patient has been scheduled. Aware of appt date and time.    Scheduling Message Entered by [place], AMY W on 11/09/2023 at  4:17 PM Priority: High EST PT 15  Department: [facility] MED ONC  Provider: Ezzard Valaria LABOR, MD  Appointment Notes:  Dr Ezzard wants to see pt next week to review scan results

## 2023-11-09 NOTE — Telephone Encounter (Signed)
 Pt states, I can see the results of CT scan on my chart, but I doesn't understand it.

## 2023-11-09 NOTE — Telephone Encounter (Signed)
 Dr Ezzard tell her that scan looks good, but he wants to go over it & show her the scans in the office.

## 2023-11-13 ENCOUNTER — Other Ambulatory Visit: Payer: Self-pay

## 2023-11-14 NOTE — Progress Notes (Unsigned)
 [facility]  [address] [place],  [place]  [address] [phone]  Clinic Day:  09/06/2023  Referring physician: Nicholaus Credit, PA-C   HISTORY OF PRESENT ILLNESS:  The patient is a 73 y.o. 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  She comes in today to go over her CT scans to ascertain her new disease baseline after the completion of all of her definitive therapy.  She initially received concurrent chemoradiation with weekly carboplatin /paclitaxel  before completing 1 full year of maintenance Durvalumab  in August 2025.  She has chronic coughing, which has gotten better, but denies having any new respiratory symptoms which concern her for progression of her lung cancer.  With respect to her lung cancer history, she initially underwent a right middle lobectomy in February 2022.  Due to the mediastinal nodal recurrence in May 2024, the patient underwent definitive chemoradiation with weekly carboplatin /paclitaxel , which was completed in late July 2024.  Since then, she has been on maintenance durvalumab , for which she continues to take.    PHYSICAL EXAM:  There were no vitals taken for this visit. Wt Readings from Last 3 Encounters:  11/01/23 137 lb (62.1 kg)  10/04/23 138 lb 3.2 oz (62.7 kg)  09/06/23 134 lb 8 oz (61 kg)   There is no height or weight on file to calculate BMI. Performance status (ECOG): 1 Physical Exam Constitutional:      Appearance: Normal appearance. She is not ill-appearing.     Comments: She continues to look physically better with successive visits  HENT:     Nose: No rhinorrhea.     Mouth/Throat:     Mouth: Mucous membranes are moist.     Pharynx: Oropharynx is clear. No oropharyngeal exudate or posterior oropharyngeal erythema.  Cardiovascular:     Rate and Rhythm: Normal rate and regular rhythm.     Heart sounds: No murmur heard.    No friction rub. No gallop.   Pulmonary:     Effort: Pulmonary effort is normal. No respiratory distress.     Breath sounds: Decreased air movement present. Examination of the right-upper field reveals decreased breath sounds. Examination of the left-upper field reveals decreased breath sounds. Decreased breath sounds present. No wheezing, rhonchi or rales.  Abdominal:     General: Bowel sounds are normal. There is no distension.     Palpations: Abdomen is soft. There is no mass.     Tenderness: There is no abdominal tenderness.  Musculoskeletal:        General: No swelling.     Right lower leg: No edema.     Left lower leg: No edema.  Lymphadenopathy:     Cervical: No cervical adenopathy.     Upper Body:     Right upper body: No supraclavicular or axillary adenopathy.     Left upper body: No supraclavicular or axillary adenopathy.     Lower Body: No right inguinal adenopathy. No left inguinal adenopathy.  Skin:    General: Skin is warm.     Coloration: Skin is not jaundiced.     Findings: No lesion or rash.  Neurological:     General: No focal deficit present.     Mental Status: She is alert and oriented to person, place, and time. Mental status is at baseline.  Psychiatric:        Mood and Affect: Mood normal.        Behavior: Behavior normal.  Thought Content: Thought content normal.    LABS:      Latest Ref Rng & Units 11/08/2023    8:56 AM 10/04/2023    8:05 AM 09/06/2023    8:45 AM  CBC  WBC 4.0 - 10.5 K/uL 6.4  5.9  5.7   Hemoglobin 12.0 - 15.0 g/dL 86.1  86.6  87.1   Hematocrit 36.0 - 46.0 % 41.2  40.9  38.3   Platelets 150 - 400 K/uL 274  243  230       Latest Ref Rng & Units 11/08/2023    8:56 AM 10/04/2023    8:05 AM 09/06/2023    8:45 AM  CMP  Glucose 70 - 99 mg/dL 892  892  836   BUN 8 - 23 mg/dL 11  11  16    Creatinine 0.44 - 1.00 mg/dL 9.16  9.14  9.12   Sodium 135 - 145 mmol/L 139  138  137   Potassium 3.5 - 5.1 mmol/L 3.8  4.0  3.7   Chloride 98 - 111 mmol/L 102  102  102    CO2 22 - 32 mmol/L 24  25  23    Calcium  8.9 - 10.3 mg/dL 9.2  9.1  8.9   Total Protein 6.5 - 8.1 g/dL 7.1  6.9  6.8   Total Bilirubin 0.0 - 1.2 mg/dL 0.4  0.4  0.4   Alkaline Phos 38 - 126 U/L 82  79  90   AST 15 - 41 U/L 26  30  30    ALT 0 - 44 U/L 21  22  25     ASSESSMENT & PLAN:  A 73 y.o. 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 She will proceed with her 11th cycle of durvalumab  immunotherapy today.  Clinically, the patient appears to be doing well.  I will see this patient back in 4 weeks before she heads into her 12th and final cycle of maintenance durvalumab  immunotherapy.  The patient understands all the plans discussed today and is in agreement with them.  Artie Takayama DELENA Kerns, MD

## 2023-11-15 ENCOUNTER — Telehealth: Payer: Self-pay | Admitting: Oncology

## 2023-11-15 ENCOUNTER — Other Ambulatory Visit: Payer: Self-pay | Admitting: Oncology

## 2023-11-15 ENCOUNTER — Inpatient Hospital Stay: Admitting: Oncology

## 2023-11-15 VITALS — BP 110/66 | HR 112 | Temp 97.7°F | Resp 16 | Ht 67.0 in | Wt 137.3 lb

## 2023-11-15 DIAGNOSIS — C3491 Malignant neoplasm of unspecified part of right bronchus or lung: Secondary | ICD-10-CM | POA: Diagnosis not present

## 2023-11-15 DIAGNOSIS — Z85118 Personal history of other malignant neoplasm of bronchus and lung: Secondary | ICD-10-CM | POA: Diagnosis not present

## 2023-11-15 NOTE — Telephone Encounter (Signed)
 Patient has been scheduled for follow-up visit per 11/15/23 LOS.  Pt given an appt calendar with date and time.

## 2023-11-22 ENCOUNTER — Ambulatory Visit (INDEPENDENT_AMBULATORY_CARE_PROVIDER_SITE_OTHER)

## 2023-11-22 DIAGNOSIS — Z23 Encounter for immunization: Secondary | ICD-10-CM | POA: Diagnosis not present

## 2023-11-26 ENCOUNTER — Other Ambulatory Visit: Payer: Self-pay

## 2023-11-26 DIAGNOSIS — R1310 Dysphagia, unspecified: Secondary | ICD-10-CM

## 2024-01-08 ENCOUNTER — Ambulatory Visit (INDEPENDENT_AMBULATORY_CARE_PROVIDER_SITE_OTHER): Admitting: Physician Assistant

## 2024-01-08 ENCOUNTER — Encounter: Payer: Self-pay | Admitting: Physician Assistant

## 2024-01-08 VITALS — BP 122/72 | HR 91 | Temp 97.8°F | Ht 67.0 in | Wt 139.0 lb

## 2024-01-08 DIAGNOSIS — M25561 Pain in right knee: Secondary | ICD-10-CM | POA: Diagnosis not present

## 2024-01-08 MED ORDER — TRIAMCINOLONE ACETONIDE 40 MG/ML IJ SUSP
60.0000 mg | Freq: Once | INTRAMUSCULAR | Status: AC
  Administered 2024-01-08: 60 mg via INTRAMUSCULAR

## 2024-01-08 NOTE — Progress Notes (Signed)
   Acute Office Visit  Subjective:    Patient ID: [name], female    DOB: 10/13/1950, 73 y.o.   MRN: [id number]  Chief Complaint  Patient presents with   Right knee pain    HPI: Patient is in today for complaints of right knee pain.  She states about 3 weeks ago she got her foot caught in a chair and fell onto right knee.  She states overall she is doing much better but still tender to touch and with certain movements.  She is able to bear weight well and bend knee.  She has trouble with swallowing pills and would like kenalog  injection   Current Outpatient Medications:    benzonatate  (TESSALON ) 100 MG capsule, Take 1 capsule (100 mg total) by mouth 3 (three) times daily as needed for cough., Disp: 30 capsule, Rfl: 1   Fluticasone -Umeclidin-Vilant (TRELEGY ELLIPTA ) 100-62.5-25 MCG/ACT AEPB, Inhale 1 puff into the lungs daily., Disp: 2 each, Rfl: 0   lidocaine -prilocaine  (EMLA ) cream, Apply 1 Application topically as needed. Apply 1 hour prior to lab appt. Cover with plastic wrap,, Disp: 30 g, Rfl: 0   Multiple Vitamin (MULTIVITAMIN) tablet, Take 1 tablet by mouth daily., Disp: , Rfl:    mupirocin  ointment (BACTROBAN ) 2 %, Apply 1 application topically 2 (two) times daily., Disp: 22 g, Rfl: 2   ondansetron  (ZOFRAN -ODT) 4 MG disintegrating tablet, Take 1 tablet (4 mg total) by mouth every 8 (eight) hours as needed., Disp: 20 tablet, Rfl: 0   pantoprazole  (PROTONIX ) 40 MG tablet, TAKE 1 TABLET BY MOUTH TWICE A DAY, Disp: 180 tablet, Rfl: 2   polyethylene glycol (MIRALAX / GLYCOLAX) 17 g packet, Take 17 g by mouth daily as needed for moderate constipation., Disp: , Rfl:    prochlorperazine  (COMPAZINE ) 25 MG suppository, Place 1 suppository (25 mg total) rectally every 12 (twelve) hours., Disp: 30 suppository, Rfl: 1   promethazine  (PHENERGAN ) 25 MG tablet, Take 1 tablet (25 mg total) by mouth every 6 (six) hours as needed for nausea., Disp: 30 tablet, Rfl: 3  Allergies  Allergen Reactions    Codeine Hives   Surgical Lubricant Hives    ROS CONSTITUTIONAL: Negative for chills, fatigue, fever,  CARDIOVASCULAR: Negative for chest pain, dizziness, palpitations and pedal edema.  RESPIRATORY: Negative for recent cough and dyspnea.   MSK: see HPI      Objective:    PHYSICAL EXAM:   BP 122/72 (BP Location: Left Arm, Patient Position: Sitting)   Pulse 91   Temp 97.8 F (36.6 C) (Temporal)   Ht 5' 7 (1.702 m)   Wt 139 lb (63 kg)   SpO2 98%   BMI 21.77 kg/m    GEN: Well nourished, well developed, in no acute distress  Cardiac: RRR; no murmurs, rubs, or gallops,no edema -  Respiratory:  normal respiratory rate and pattern with no distress - normal breath sounds with no rales, rhonchi, wheezes or rubs MS: no deformity or atrophy - tender to medial aspect of knee to palpation - full rom noted Psych: euthymic mood, appropriate affect and demeanor     Assessment & Plan:    Acute pain of right knee -     Triamcinolone  Acetonide 60mg  given IM Rom exercises     Follow-up: Return if symptoms worsen or fail to improve.  An After Visit Summary was printed and given to the patient.  CAMIE JONELLE NICHOLAUS DEVONNA [facility] [phone]

## 2024-01-22 ENCOUNTER — Encounter: Payer: Self-pay | Admitting: Oncology

## 2024-02-22 ENCOUNTER — Ambulatory Visit: Payer: Self-pay

## 2024-02-22 NOTE — Telephone Encounter (Signed)
 FYI Only or Action Required?: Action required by provider: clinical question for provider and update on patient condition.  Patient was last seen in primary care on 01/08/2024 by Renee Credit, PA-C.  Called Nurse Triage reporting Hoarse and Cough.  Symptoms began yesterday.  Interventions attempted: OTC medications: Robitussin, Claritin.  Symptoms are: gradually worsening.  Triage Disposition: Call PCP Within 24 Hours  Patient/caregiver understands and will follow disposition?: Yes           Copied from CRM #[id number]. Topic: Clinical - Pink Gardner Triage >> Feb 22, 2024  7:59 AM [name] wrote: [name] triggered transfer to Nurse Triage. See Triage Message for details.   ----------------------------------------------------------------------- From previous Reason for Contact - Scheduling: Patient/patient representative is calling to schedule an appointment. Refer to attachments for appointment information. >> Feb 22, 2024 10:26 AM Renee MATSU wrote: Patient's son in law and patient are on the phone calling back stating that whoever they spoke with today in regards to symptoms patient is having adv them that patient would be able to be worked in today, but MyChart says her appt is not until 01/05, they are wanting to be seen today. >> Feb 22, 2024  8:01 AM [name] wrote: Reason for Triage: Pt is hoarse, almost lost her voice, coughing, cramped last night in her legs, just does not feel good.   Best contact: [phone] but please call daughter [name] [phone] since the patient cannot hardly talk Reason for Disposition  Patient is HIGH RISK (e.g., 65 years and older, pregnant, HIV+, or chronic medical condition)  Answer Assessment - Initial Assessment Questions 1. SYMPTOMS: What is your main symptom or concern? (e.g., cough, fever, shortness of breath, muscle aches)     Productive cough with yellow mucous, hoarse voice, headache.  2. ONSET: When did the symptoms start?       Yesterday.  3. COUGH: Do you have a cough? If Yes, ask: How bad is the cough?       Yes. Chest burning when coughing.  4. FEVER: Do you have a fever? If Yes, ask: What is your temperature, how was it measured, and when did it start?     No.  5. BREATHING DIFFICULTY: Are you having any difficulty breathing? (e.g., normal; shortness of breath, wheezing, unable to speak)      No. Denies any SOB or wheezing. Son in law states patient seems like some SOB.  6. BETTER-SAME-WORSE: Are you getting better, staying the same or getting worse compared to yesterday?  If getting worse, ask, In what way?     Symptoms started yesterday.  7. OTHER SYMPTOMS: Do you have any other symptoms?  (e.g., chills, fatigue, headache, loss of smell or taste, muscle pain, sore throat)     Nasal congestion/feels fully or heavy, hoarse voice, leg cramps last night. No fever, sinus pain, hemoptysis.  8. INFLUENZA EXPOSURE: Was there any known exposure to influenza (flu) before the symptoms began?      Yes, great granddaughter positive for Flu A.  9. INFLUENZA SUSPECTED: Why do you think you have influenza? (e.g., positive flu self-test at home, symptoms after exposure).     Symptoms after exposure.  10. INFLUENZA VACCINE: Have you had the flu vaccine? If Yes, ask: When did you last get it?       Yes, October 2025.  11. HIGH RISK FOR COMPLICATIONS: Do you have any chronic medical problems? (e.g., asthma, heart or lung disease, obesity, weak immune system)  Lung cancer and COPD.  12. PREGNANCY: Is there any chance you are pregnant? When was your last menstrual period?       N/A.  13. O2 SATURATION MONITOR:  Do you use an oxygen saturation monitor (pulse oximeter) at home? If Yes, ask What is your reading (oxygen level) today? What is your usual oxygen saturation reading? (e.g., 95%)       Yes. SpO2 reading 93% . Per chart review oxygen saturation baseline is  98%.  Protocols used: Influenza (Flu) Suspected-A-AH

## 2024-02-22 NOTE — Telephone Encounter (Signed)
 Patient aware we currently have no availability advised to be seen at Urgent Care. Patient declined stating they are packed, advised that should her symptoms worsen to be seen at urgent Care or the ED. Patient expressed understanding. Kept appointment for Monday. Stated I would still forward this message per their request.

## 2024-02-22 NOTE — Telephone Encounter (Signed)
 FYI Only or Action Required?: FYI only for provider: advised UC today.  Patient was last seen in primary care on 01/08/2024 by Nicholaus Credit, PA-C.  Called Nurse Triage reporting Hoarse and Shortness of Breath.  Symptoms began yesterday.  Interventions attempted: Rest, hydration, or home remedies.  Symptoms are: gradually worsening.  Triage Disposition: See HCP Within 4 Hours (Or PCP Triage)  Patient/caregiver understands and will follow disposition?:     Message from [facility] sent at 02/22/2024  8:01 AM EST:  Summary: Seeking appt, no appt soon enough   Reason for Triage: Pt is hoarse, almost lost her voice, coughing, cramped last night in her legs, just does not feel good.  Best contact: [phone] but please call daughter [name] [phone] since the patient cannot hardly talk         Reason for Disposition  [1] MILD difficulty breathing (e.g., minimal/no SOB at rest, SOB with walking, pulse < 100) AND [2] NEW-onset or WORSE than normal  Answer Assessment - Initial Assessment Questions This RN recommended that if SOB, go to UC, advised pt be examined today in agreement with another [place]'s triage. Pt daughter states she will tell pt to go to UC.   Speaking to pt daughter [name] SOB no not really, a little bit short They scheduled her for Monday but she's sick, thought it was for today but in MyChart it said for Monday Advised pt go to UC, call ahead and see if can hold spot in line, see if can wait out in car and be called when her turn  Protocols used: Breathing Difficulty-A-AH

## 2024-02-25 ENCOUNTER — Ambulatory Visit: Admitting: Physician Assistant

## 2024-03-06 ENCOUNTER — Encounter: Payer: Self-pay | Admitting: Physician Assistant

## 2024-03-06 ENCOUNTER — Ambulatory Visit: Admitting: Physician Assistant

## 2024-03-06 VITALS — BP 100/62 | HR 60 | Temp 98.4°F | Ht 67.0 in | Wt 141.4 lb

## 2024-03-06 DIAGNOSIS — J060 Acute laryngopharyngitis: Secondary | ICD-10-CM

## 2024-03-06 DIAGNOSIS — E782 Mixed hyperlipidemia: Secondary | ICD-10-CM | POA: Diagnosis not present

## 2024-03-06 LAB — POCT LIPID PANEL
HDL: 45
LDL: 135
Non-HDL: 164
TC/HDL: 3
TC: 209
TRG: 144

## 2024-03-06 MED ORDER — AZITHROMYCIN 250 MG PO TABS: ORAL_TABLET | ORAL | 0 refills | Status: AC

## 2024-03-06 NOTE — Progress Notes (Signed)
 "  Acute Office Visit  Subjective:    Patient ID: [name], female    DOB: 12-30-1950, 74 y.o.   MRN: [id number]  Chief Complaint  Patient presents with   Cough    HPI: Patient is in today for complaints of cough, congestion - she states that a few weeks ago she went to urgent care and treated for uri - she tested negative for COVID and flu She did get a shot of prednisone  and treated with antibiotics - was feeling better but then cough resumed  Pt would like POC lipid panel done today - she is overdue for it Has appt next week for other labwork with hematologist  Current Medications[1]  Allergies[2]  ROS CONSTITUTIONAL: Negative for chills, fatigue, fever,  E/N/T: see HPI CARDIOVASCULAR: Negative for chest pain, dizziness, palpitations  RESPIRATORY: see HPI GASTROINTESTINAL: Negative for abdominal pain, acid reflux symptoms, constipation, diarrhea, nausea and vomiting.       Objective:    PHYSICAL EXAM:   BP 100/62   Pulse 60   Temp 98.4 F (36.9 C)   Ht 5' 7 (1.702 m)   Wt 141 lb 6.4 oz (64.1 kg)   SpO2 95%   BMI 22.15 kg/m    GEN: Well nourished, well developed, in no acute distress  HEENT: normal external ears and nose - normal external auditory canals and TMS -  - Lips, Teeth and Gums - normal  Oropharynx - erythema/pnd Cardiac: RRR; no murmurs, rubs Respiratory:  normal respiratory rate and pattern with no distress - normal breath sounds with no rales, rhonchi, wheezes or rubs Psych: euthymic mood, appropriate affect and demeanor     Office Visit on 03/06/2024  Component Date Value Ref Range Status   TC 03/06/2024 209   Final   HDL 03/06/2024 45   Final   TRG 03/06/2024 144   Final   LDL 03/06/2024 135   Final   Non-HDL 03/06/2024 164   Final   TC/HDL 03/06/2024 3.0   Final      Assessment & Plan Mixed hyperlipidemia Recommend to decrease fried/fatty foods in diet  Orders:   POCT Lipid Panel  Acute laryngopharyngitis  Orders:    azithromycin  (ZITHROMAX ) 250 MG tablet; Take 2 tablets on day 1, then 1 tablet daily on days 2 through 5    Follow-up: Return if symptoms worsen or fail to improve.  An After Visit Summary was printed and given to the patient.  SARA R Giordano Getman, PA-C [facility] [phone]    [1]  Current Outpatient Medications:    azithromycin  (ZITHROMAX ) 250 MG tablet, Take 2 tablets on day 1, then 1 tablet daily on days 2 through 5, Disp: 6 tablet, Rfl: 0   benzonatate  (TESSALON ) 100 MG capsule, Take 1 capsule (100 mg total) by mouth 3 (three) times daily as needed for cough., Disp: 30 capsule, Rfl: 1   cyclobenzaprine  (FLEXERIL ) 10 MG tablet, Take 10 mg by mouth 3 (three) times daily as needed., Disp: , Rfl:    Fluticasone -Umeclidin-Vilant (TRELEGY ELLIPTA ) 100-62.5-25 MCG/ACT AEPB, Inhale 1 puff into the lungs daily., Disp: 2 each, Rfl: 0   lidocaine -prilocaine  (EMLA ) cream, Apply 1 Application topically as needed. Apply 1 hour prior to lab appt. Cover with plastic wrap,, Disp: 30 g, Rfl: 0   Multiple Vitamin (MULTIVITAMIN) tablet, Take 1 tablet by mouth daily., Disp: , Rfl:    mupirocin  ointment (BACTROBAN ) 2 %, Apply 1 application topically 2 (two) times daily., Disp: 22 g, Rfl: 2  ondansetron  (ZOFRAN -ODT) 4 MG disintegrating tablet, Take 1 tablet (4 mg total) by mouth every 8 (eight) hours as needed., Disp: 20 tablet, Rfl: 0   pantoprazole  (PROTONIX ) 40 MG tablet, TAKE 1 TABLET BY MOUTH TWICE A DAY, Disp: 180 tablet, Rfl: 2   polyethylene glycol (MIRALAX / GLYCOLAX) 17 g packet, Take 17 g by mouth daily as needed for moderate constipation., Disp: , Rfl:    prochlorperazine  (COMPAZINE ) 25 MG suppository, Place 1 suppository (25 mg total) rectally every 12 (twelve) hours., Disp: 30 suppository, Rfl: 1   promethazine  (PHENERGAN ) 25 MG tablet, Take 1 tablet (25 mg total) by mouth every 6 (six) hours as needed for nausea., Disp: 30 tablet, Rfl: 3   promethazine -dextromethorphan   (PROMETHAZINE -DM) 6.25-15 MG/5ML syrup, Take 5 mLs by mouth every 6 (six) hours as needed., Disp: , Rfl:  [2]  Allergies Allergen Reactions   Codeine Hives   Surgical Lubricant Hives   "

## 2024-03-06 NOTE — Assessment & Plan Note (Signed)
 Recommend to decrease fried/fatty foods in diet  Orders:   POCT Lipid Panel

## 2024-03-06 NOTE — Assessment & Plan Note (Signed)
" °  Orders:   azithromycin  (ZITHROMAX ) 250 MG tablet; Take 2 tablets on day 1, then 1 tablet daily on days 2 through 5  "

## 2024-03-11 ENCOUNTER — Ambulatory Visit: Admitting: Physician Assistant

## 2024-03-13 ENCOUNTER — Ambulatory Visit (HOSPITAL_BASED_OUTPATIENT_CLINIC_OR_DEPARTMENT_OTHER)
Admission: RE | Admit: 2024-03-13 | Discharge: 2024-03-13 | Disposition: A | Discharge status: Home / Self Care | Source: Ambulatory Visit | Attending: Oncology | Admitting: Oncology

## 2024-03-13 ENCOUNTER — Inpatient Hospital Stay: Attending: Oncology

## 2024-03-13 DIAGNOSIS — C3491 Malignant neoplasm of unspecified part of right bronchus or lung: Secondary | ICD-10-CM

## 2024-03-13 LAB — CMP (CANCER CENTER ONLY)
ALT: 25 U/L (ref 0–44)
AST: 27 U/L (ref 15–41)
Albumin: 3.9 g/dL (ref 3.5–5.0)
Alkaline Phosphatase: 71 U/L (ref 38–126)
Anion gap: 10 (ref 5–15)
BUN: 16 mg/dL (ref 8–23)
CO2: 27 mmol/L (ref 22–32)
Calcium: 9.1 mg/dL (ref 8.9–10.3)
Chloride: 99 mmol/L (ref 98–111)
Creatinine: 0.89 mg/dL (ref 0.44–1.00)
GFR, Estimated: >60 mL/min
Glucose, Bld: 82 mg/dL (ref 70–99)
Potassium: 3.9 mmol/L (ref 3.5–5.1)
Sodium: 136 mmol/L (ref 135–145)
Total Bilirubin: 0.3 mg/dL (ref 0.0–1.2)
Total Protein: 6.9 g/dL (ref 6.5–8.1)

## 2024-03-13 LAB — CBC WITH DIFFERENTIAL (CANCER CENTER ONLY)
Abs Immature Granulocytes: 0.06 K/uL (ref 0.00–0.07)
Basophils Absolute: 0 K/uL (ref 0.0–0.1)
Basophils Relative: 0 %
Eosinophils Absolute: 0.2 K/uL (ref 0.0–0.5)
Eosinophils Relative: 2 %
HCT: 40.9 % (ref 36.0–46.0)
Hemoglobin: 13.5 g/dL (ref 12.0–15.0)
Immature Granulocytes: 1 %
Lymphocytes Relative: 21 %
Lymphs Abs: 2 K/uL (ref 0.7–4.0)
MCH: 29.6 pg (ref 26.0–34.0)
MCHC: 33 g/dL (ref 30.0–36.0)
MCV: 89.7 fL (ref 80.0–100.0)
Monocytes Absolute: 0.9 K/uL (ref 0.1–1.0)
Monocytes Relative: 9 %
Neutro Abs: 6.1 K/uL (ref 1.7–7.7)
Neutrophils Relative %: 67 %
Platelet Count: 271 K/uL (ref 150–400)
RBC: 4.56 MIL/uL (ref 3.87–5.11)
RDW: 13.7 % (ref 11.5–15.5)
WBC Count: 9.1 K/uL (ref 4.0–10.5)
nRBC: 0 % (ref 0.0–0.2)

## 2024-03-13 MED ORDER — HEPARIN SOD (PORK) LOCK FLUSH 100 UNIT/ML IV SOLN
500.0000 [IU] | Freq: Once | INTRAVENOUS | Status: AC
  Administered 2024-03-13: 500 [IU] via INTRAVENOUS

## 2024-03-13 MED ORDER — IOHEXOL 300 MG/ML  SOLN
100.0000 mL | Freq: Once | INTRAMUSCULAR | Status: AC | PRN
  Administered 2024-03-13: 65 mL via INTRAVENOUS

## 2024-03-14 ENCOUNTER — Telehealth: Payer: Self-pay | Admitting: Gastroenterology

## 2024-03-14 NOTE — Telephone Encounter (Signed)
 Attempted to reach patient to offer an earlier appointment.  Requested return call to speak with a nurse to get her on schedule sooner.  Also sent message through MyChart

## 2024-03-14 NOTE — Telephone Encounter (Signed)
 Incoming call from pt regarding rescheduling appointment for 03/17/2024. Pt rescheduled for 03/09 @3 :40pm. Pt stated she is having trouble swallowing and requesting a sooner appt if possible. Please advise. Thank you

## 2024-03-17 ENCOUNTER — Ambulatory Visit: Admitting: Gastroenterology

## 2024-03-17 ENCOUNTER — Other Ambulatory Visit

## 2024-03-17 ENCOUNTER — Other Ambulatory Visit (HOSPITAL_BASED_OUTPATIENT_CLINIC_OR_DEPARTMENT_OTHER): Admitting: Radiology

## 2024-03-17 NOTE — Progress Notes (Unsigned)
 " [facility] [address] [place],  [place]  [address] [phone]  Clinic Day:  11/15/2023  Referring physician: Nicholaus Credit, PA-C   HISTORY OF PRESENT ILLNESS:  The patient is a 74 y.o. 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  She comes in today to go over her CT scans to ascertain her new disease baseline after the completion of all of her definitive therapy.  She initially received concurrent chemoradiation with weekly carboplatin /paclitaxel  before completing 1 full year of maintenance Durvalumab  in August 2025.  She has chronic coughing, which has gotten better.  She  denies having any new respiratory symptoms which concern her for early disease recurrence.  Of note, the major problem this patient has currently is increased dysphagia.  She has already undergone 1 esophageal dilation in the past.  She is very concerned another one needs to be considered imminently.  With respect to her lung cancer history, she initially underwent a right middle lobectomy in February 2022.  Due to the mediastinal nodal recurrence in May 2024, the patient underwent definitive chemoradiation with weekly carboplatin /paclitaxel , which was completed in late July 2024.  She completed her 1 year of maintenance Durvalumab  in August 2025.  PHYSICAL EXAM:  There were no vitals taken for this visit. Wt Readings from Last 3 Encounters:  03/06/24 141 lb 6.4 oz (64.1 kg)  01/08/24 139 lb (63 kg)  11/15/23 137 lb 4.8 oz (62.3 kg)   There is no height or weight on file to calculate BMI. Performance status (ECOG): 1 Physical Exam Constitutional:      Appearance: Normal appearance. She is not ill-appearing.     Comments: She continues to look physically better with successive visits  HENT:     Nose: No rhinorrhea.     Mouth/Throat:     Mouth: Mucous membranes are moist.     Pharynx: Oropharynx is clear. No oropharyngeal exudate  or posterior oropharyngeal erythema.  Cardiovascular:     Rate and Rhythm: Normal rate and regular rhythm.     Heart sounds: No murmur heard.    No friction rub. No gallop.  Pulmonary:     Effort: Pulmonary effort is normal. No respiratory distress.     Breath sounds: Decreased air movement present. Examination of the right-upper field reveals decreased breath sounds. Examination of the left-upper field reveals decreased breath sounds. Decreased breath sounds present. No wheezing, rhonchi or rales.  Abdominal:     General: Bowel sounds are normal. There is no distension.     Palpations: Abdomen is soft. There is no mass.     Tenderness: There is no abdominal tenderness.  Musculoskeletal:        General: No swelling.     Right lower leg: No edema.     Left lower leg: No edema.  Lymphadenopathy:     Cervical: No cervical adenopathy.     Upper Body:     Right upper body: No supraclavicular or axillary adenopathy.     Left upper body: No supraclavicular or axillary adenopathy.     Lower Body: No right inguinal adenopathy. No left inguinal adenopathy.  Skin:    General: Skin is warm.     Coloration: Skin is not jaundiced.     Findings: No lesion or rash.  Neurological:     General: No focal deficit present.     Mental Status: She is alert and oriented to person, place, and  time. Mental status is at baseline.  Psychiatric:        Mood and Affect: Mood normal.        Behavior: Behavior normal.        Thought Content: Thought content normal.   SCANS:  Her chest CT on 11/08/2023 revealed the following: FINDINGS: Cardiovascular: Right Port-A-Cath tip high right atrium. Advanced aortic and branch vessel atherosclerosis. Mild cardiomegaly, without pericardial effusion. Lad and left circumflex coronary artery calcification.   No central pulmonary embolism, on this non-dedicated study. 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   Mediastinum/Nodes: No  supraclavicular adenopathy. No mediastinal or hilar adenopathy. Tiny hiatal hernia.   Lungs/Pleura: No pleural fluid.   Right middle lobectomy.   Mild volume loss in the right hemithorax with mediastinal shift rightward.   Moderate centrilobular emphysema.   Biapical pleuroparenchymal scarring.   Similar appearance of geographic posteromedial superior segment right lower lobe consolidation with architectural distortion and traction bronchiectasis. Minimal anteromedial left upper lobe interstitial thickening, likely also radiation induced. Similar.   Central right lower lobe 5 mm nodule versus area of mucoid impaction on image 75/302, similar.   Upper Abdomen: Hepatic steatosis. Normal imaged portions of the spleen, pancreas, adrenal glands, left kidney. Proximal gastric underdistention.   Musculoskeletal: No acute osseous abnormality.   IMPRESSION: 1. Status post right middle lobectomy. 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 No findings of recurrent or metastatic disease. 2. Aortic atherosclerosis (ICD10-I70.0), coronary artery atherosclerosis and emphysema (ICD10-J43.9). 3. Hepatic steatosis 4. Similar pericardial cyst. 5.  Tiny hiatal hernia.     LABS:      Latest Ref Rng & Units 03/13/2024   11:49 AM 11/08/2023    8:56 AM 10/04/2023    8:05 AM  CBC  WBC 4.0 - 10.5 K/uL 9.1  6.4  5.9   Hemoglobin 12.0 - 15.0 g/dL 86.4  86.1  86.6   Hematocrit 36.0 - 46.0 % 40.9  41.2  40.9   Platelets 150 - 400 K/uL 271  274  243       Latest Ref Rng & Units 03/13/2024   11:49 AM 11/08/2023    8:56 AM 10/04/2023    8:05 AM  CMP  Glucose 70 - 99 mg/dL 82  892  892   BUN 8 - 23 mg/dL 16  11  11    Creatinine 0.44 - 1.00 mg/dL 9.10  9.16  9.14   Sodium 135 - 145 mmol/L 136  139  138   Potassium 3.5 - 5.1 mmol/L 3.9  3.8  4.0   Chloride 98 - 111 mmol/L 99  102  102   CO2 22 - 32 mmol/L 27  24  25    Calcium  8.9 - 10.3 mg/dL  9.1  9.2  9.1   Total Protein 6.5 - 8.1 g/dL 6.9  7.1  6.9   Total Bilirubin 0.0 - 1.2 mg/dL 0.3  0.4  0.4   Alkaline Phos 38 - 126 U/L 71  82  79   AST 15 - 41 U/L 27  26  30    ALT 0 - 44 U/L 25  21  22     ASSESSMENT & PLAN:  A 74 y.o. 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 In clinic today, I went over CT images with her, for which she could see there is no evidence of disease recurrence.  Understandably, the patient was pleased with her  CT images.   I definitely believe her dysphagia is due to her previous radiation that was used to treat her mediastinal disease recurrence.  We will refer her to GI to see if additional esophageal dilation procedures can be done to help alleviate this problem.  Otherwise, as it pertains to her lung cancer surveillance, I will follow her with chest CT images once every 4 months for his first year to ensure there is no radiographic evidence of disease recurrence.  Her next chest CT will be done a day before her next clinic visit with me in January 2026.  The patient understands all the plans discussed today and is in agreement with them.  Syana Degraffenreid DELENA Kerns, MD   "

## 2024-03-18 ENCOUNTER — Inpatient Hospital Stay: Admitting: Oncology

## 2024-03-18 ENCOUNTER — Other Ambulatory Visit: Payer: Self-pay | Admitting: Oncology

## 2024-03-18 ENCOUNTER — Telehealth: Payer: Self-pay | Admitting: Oncology

## 2024-03-18 VITALS — BP 135/60 | HR 88 | Temp 97.7°F | Resp 14 | Ht 67.0 in | Wt 144.0 lb

## 2024-03-18 DIAGNOSIS — C3491 Malignant neoplasm of unspecified part of right bronchus or lung: Secondary | ICD-10-CM

## 2024-03-18 NOTE — Telephone Encounter (Signed)
 Patient has been scheduled for follow-up visit per 03/18/2024 LOS.  Pt aware of scheduled appt details.

## 2024-03-19 ENCOUNTER — Other Ambulatory Visit: Payer: Self-pay

## 2024-03-24 ENCOUNTER — Ambulatory Visit: Admitting: Gastroenterology

## 2024-03-25 ENCOUNTER — Ambulatory Visit

## 2024-04-02 ENCOUNTER — Ambulatory Visit: Admitting: Gastroenterology

## 2024-04-15 ENCOUNTER — Ambulatory Visit

## 2024-04-28 ENCOUNTER — Ambulatory Visit: Admitting: Gastroenterology

## 2024-04-30 ENCOUNTER — Ambulatory Visit: Admitting: Physician Assistant

## 2024-07-10 ENCOUNTER — Inpatient Hospital Stay

## 2024-07-10 ENCOUNTER — Other Ambulatory Visit (HOSPITAL_BASED_OUTPATIENT_CLINIC_OR_DEPARTMENT_OTHER): Admitting: Radiology

## 2024-07-16 ENCOUNTER — Inpatient Hospital Stay: Admitting: Oncology
